# Patient Record
Sex: Female | Born: 1940 | Race: White | Hispanic: No | Marital: Single | Smoking: Former smoker
Health system: Southern US, Community
[De-identification: ages and names within clinical notes are randomized; demographics above are authoritative.]

## PROBLEM LIST (undated history)

## (undated) DIAGNOSIS — T7840XA Allergy, unspecified, initial encounter: Secondary | ICD-10-CM

## (undated) DIAGNOSIS — I10 Essential (primary) hypertension: Secondary | ICD-10-CM

## (undated) DIAGNOSIS — K7460 Unspecified cirrhosis of liver: Secondary | ICD-10-CM

## (undated) DIAGNOSIS — K7581 Nonalcoholic steatohepatitis (NASH): Secondary | ICD-10-CM

## (undated) DIAGNOSIS — E559 Vitamin D deficiency, unspecified: Secondary | ICD-10-CM

## (undated) DIAGNOSIS — K5900 Constipation, unspecified: Secondary | ICD-10-CM

## (undated) DIAGNOSIS — D696 Thrombocytopenia, unspecified: Secondary | ICD-10-CM

## (undated) DIAGNOSIS — R161 Splenomegaly, not elsewhere classified: Secondary | ICD-10-CM

## (undated) DIAGNOSIS — L439 Lichen planus, unspecified: Secondary | ICD-10-CM

## (undated) DIAGNOSIS — E785 Hyperlipidemia, unspecified: Secondary | ICD-10-CM

## (undated) DIAGNOSIS — I8500 Esophageal varices without bleeding: Secondary | ICD-10-CM

## (undated) DIAGNOSIS — J309 Allergic rhinitis, unspecified: Secondary | ICD-10-CM

## (undated) DIAGNOSIS — E119 Type 2 diabetes mellitus without complications: Secondary | ICD-10-CM

## (undated) DIAGNOSIS — K635 Polyp of colon: Secondary | ICD-10-CM

## (undated) DIAGNOSIS — C801 Malignant (primary) neoplasm, unspecified: Secondary | ICD-10-CM

## (undated) DIAGNOSIS — K766 Portal hypertension: Secondary | ICD-10-CM

## (undated) DIAGNOSIS — K760 Fatty (change of) liver, not elsewhere classified: Secondary | ICD-10-CM

## (undated) DIAGNOSIS — H269 Unspecified cataract: Secondary | ICD-10-CM

## (undated) DIAGNOSIS — M810 Age-related osteoporosis without current pathological fracture: Secondary | ICD-10-CM

## (undated) DIAGNOSIS — E042 Nontoxic multinodular goiter: Secondary | ICD-10-CM

## (undated) DIAGNOSIS — E039 Hypothyroidism, unspecified: Secondary | ICD-10-CM

## (undated) HISTORY — PX: COLONOSCOPY: SHX174

## (undated) HISTORY — DX: Malignant (primary) neoplasm, unspecified: C80.1

## (undated) HISTORY — DX: Allergy, unspecified, initial encounter: T78.40XA

## (undated) HISTORY — DX: Esophageal varices without bleeding: I85.00

## (undated) HISTORY — DX: Thrombocytopenia, unspecified: D69.6

## (undated) HISTORY — DX: Type 2 diabetes mellitus without complications: E11.9

## (undated) HISTORY — PX: UPPER GASTROINTESTINAL ENDOSCOPY: SHX188

## (undated) HISTORY — DX: Nontoxic multinodular goiter: E04.2

## (undated) HISTORY — DX: Fatty (change of) liver, not elsewhere classified: K76.0

## (undated) HISTORY — DX: Allergic rhinitis, unspecified: J30.9

## (undated) HISTORY — DX: Unspecified cirrhosis of liver: K74.60

## (undated) HISTORY — DX: Hypothyroidism, unspecified: E03.9

## (undated) HISTORY — DX: Age-related osteoporosis without current pathological fracture: M81.0

## (undated) HISTORY — DX: Splenomegaly, not elsewhere classified: R16.1

## (undated) HISTORY — DX: Lichen planus, unspecified: L43.9

## (undated) HISTORY — DX: Polyp of colon: K63.5

## (undated) HISTORY — DX: Unspecified cataract: H26.9

## (undated) HISTORY — DX: Hyperlipidemia, unspecified: E78.5

## (undated) HISTORY — DX: Vitamin D deficiency, unspecified: E55.9

## (undated) HISTORY — PX: POLYPECTOMY: SHX149

## (undated) HISTORY — DX: Essential (primary) hypertension: I10

## (undated) HISTORY — DX: Portal hypertension: K76.6

## (undated) HISTORY — DX: Nonalcoholic steatohepatitis (NASH): K75.81

## (undated) HISTORY — DX: Constipation, unspecified: K59.00

---

## 1983-05-14 HISTORY — PX: CHOLECYSTECTOMY: SHX55

## 1997-10-05 ENCOUNTER — Other Ambulatory Visit: Admission: RE | Admit: 1997-10-05 | Discharge: 1997-10-05 | Payer: Self-pay | Admitting: Obstetrics and Gynecology

## 1998-10-18 ENCOUNTER — Other Ambulatory Visit: Admission: RE | Admit: 1998-10-18 | Discharge: 1998-10-18 | Payer: Self-pay | Admitting: Obstetrics and Gynecology

## 1999-12-20 ENCOUNTER — Encounter: Admission: RE | Admit: 1999-12-20 | Discharge: 1999-12-20 | Payer: Self-pay | Admitting: Obstetrics and Gynecology

## 1999-12-20 ENCOUNTER — Encounter: Payer: Self-pay | Admitting: Obstetrics and Gynecology

## 2000-01-16 ENCOUNTER — Other Ambulatory Visit: Admission: RE | Admit: 2000-01-16 | Discharge: 2000-01-16 | Payer: Self-pay | Admitting: Obstetrics and Gynecology

## 2001-01-02 ENCOUNTER — Encounter: Payer: Self-pay | Admitting: Obstetrics and Gynecology

## 2001-01-02 ENCOUNTER — Encounter: Admission: RE | Admit: 2001-01-02 | Discharge: 2001-01-02 | Payer: Self-pay | Admitting: Obstetrics and Gynecology

## 2002-03-19 ENCOUNTER — Encounter: Payer: Self-pay | Admitting: Obstetrics and Gynecology

## 2002-03-19 ENCOUNTER — Encounter: Admission: RE | Admit: 2002-03-19 | Discharge: 2002-03-19 | Payer: Self-pay | Admitting: Obstetrics and Gynecology

## 2002-03-24 ENCOUNTER — Other Ambulatory Visit: Admission: RE | Admit: 2002-03-24 | Discharge: 2002-03-24 | Payer: Self-pay | Admitting: Obstetrics and Gynecology

## 2003-05-14 HISTORY — PX: OTHER SURGICAL HISTORY: SHX169

## 2003-06-30 ENCOUNTER — Other Ambulatory Visit: Admission: RE | Admit: 2003-06-30 | Discharge: 2003-06-30 | Payer: Self-pay | Admitting: Obstetrics and Gynecology

## 2003-07-06 ENCOUNTER — Encounter: Admission: RE | Admit: 2003-07-06 | Discharge: 2003-07-06 | Payer: Self-pay | Admitting: Obstetrics and Gynecology

## 2003-09-30 ENCOUNTER — Encounter: Admission: RE | Admit: 2003-09-30 | Discharge: 2003-09-30 | Payer: Self-pay | Admitting: Family Medicine

## 2004-02-15 ENCOUNTER — Ambulatory Visit (HOSPITAL_COMMUNITY): Admission: RE | Admit: 2004-02-15 | Discharge: 2004-02-15 | Payer: Self-pay | Admitting: Orthopedic Surgery

## 2004-02-15 ENCOUNTER — Ambulatory Visit (HOSPITAL_BASED_OUTPATIENT_CLINIC_OR_DEPARTMENT_OTHER): Admission: RE | Admit: 2004-02-15 | Discharge: 2004-02-15 | Payer: Self-pay | Admitting: Orthopedic Surgery

## 2004-08-31 ENCOUNTER — Other Ambulatory Visit: Admission: RE | Admit: 2004-08-31 | Discharge: 2004-08-31 | Payer: Self-pay | Admitting: Obstetrics and Gynecology

## 2004-09-06 ENCOUNTER — Encounter: Admission: RE | Admit: 2004-09-06 | Discharge: 2004-09-06 | Payer: Self-pay | Admitting: Obstetrics and Gynecology

## 2005-10-30 ENCOUNTER — Encounter: Admission: RE | Admit: 2005-10-30 | Discharge: 2005-10-30 | Payer: Self-pay | Admitting: Obstetrics and Gynecology

## 2006-01-07 ENCOUNTER — Encounter: Payer: Self-pay | Admitting: Internal Medicine

## 2006-04-14 ENCOUNTER — Encounter: Admission: RE | Admit: 2006-04-14 | Discharge: 2006-07-13 | Payer: Self-pay | Admitting: Family Medicine

## 2006-05-13 HISTORY — PX: BREAST SURGERY: SHX581

## 2006-05-13 HISTORY — PX: BREAST EXCISIONAL BIOPSY: SUR124

## 2006-08-11 ENCOUNTER — Encounter: Admission: RE | Admit: 2006-08-11 | Discharge: 2006-11-09 | Payer: Self-pay | Admitting: Family Medicine

## 2006-11-03 ENCOUNTER — Encounter: Admission: RE | Admit: 2006-11-03 | Discharge: 2006-11-03 | Payer: Self-pay | Admitting: Obstetrics and Gynecology

## 2006-11-05 ENCOUNTER — Encounter: Admission: RE | Admit: 2006-11-05 | Discharge: 2006-11-05 | Payer: Self-pay | Admitting: Obstetrics and Gynecology

## 2006-12-16 ENCOUNTER — Encounter (INDEPENDENT_AMBULATORY_CARE_PROVIDER_SITE_OTHER): Payer: Self-pay | Admitting: Surgery

## 2006-12-16 ENCOUNTER — Ambulatory Visit (HOSPITAL_BASED_OUTPATIENT_CLINIC_OR_DEPARTMENT_OTHER): Admission: RE | Admit: 2006-12-16 | Discharge: 2006-12-16 | Payer: Self-pay | Admitting: Surgery

## 2006-12-16 ENCOUNTER — Encounter: Admission: RE | Admit: 2006-12-16 | Discharge: 2006-12-16 | Payer: Self-pay | Admitting: Surgery

## 2007-05-19 ENCOUNTER — Encounter: Payer: Self-pay | Admitting: Endocrinology

## 2007-06-19 ENCOUNTER — Encounter: Payer: Self-pay | Admitting: Endocrinology

## 2007-06-25 ENCOUNTER — Ambulatory Visit: Payer: Self-pay | Admitting: Hematology and Oncology

## 2007-06-30 LAB — CBC WITH DIFFERENTIAL/PLATELET
BASO%: 0.6 % (ref 0.0–2.0)
Basophils Absolute: 0 10*3/uL (ref 0.0–0.1)
EOS%: 1.2 % (ref 0.0–7.0)
HCT: 41.4 % (ref 34.8–46.6)
MCH: 30.9 pg (ref 26.0–34.0)
MCHC: 34.4 g/dL (ref 32.0–36.0)
MCV: 89.7 fL (ref 81.0–101.0)
MONO%: 6.3 % (ref 0.0–13.0)
NEUT%: 56 % (ref 39.6–76.8)
RDW: 12.5 % (ref 11.3–14.5)
lymph#: 2.2 10*3/uL (ref 0.9–3.3)

## 2007-07-02 LAB — PROTEIN ELECTROPHORESIS, SERUM, WITH REFLEX
Albumin ELP: 61.6 % (ref 55.8–66.1)
Alpha-2-Globulin: 11.6 % (ref 7.1–11.8)
Beta 2: 4.2 % (ref 3.2–6.5)
Beta Globulin: 6 % (ref 4.7–7.2)
Total Protein, Serum Electrophoresis: 7.1 g/dL (ref 6.0–8.3)

## 2007-07-02 LAB — COMPREHENSIVE METABOLIC PANEL
AST: 17 U/L (ref 0–37)
Albumin: 4.4 g/dL (ref 3.5–5.2)
Alkaline Phosphatase: 49 U/L (ref 39–117)
BUN: 19 mg/dL (ref 6–23)
Glucose, Bld: 84 mg/dL (ref 70–99)
Potassium: 4.1 mEq/L (ref 3.5–5.3)
Total Bilirubin: 0.5 mg/dL (ref 0.3–1.2)

## 2007-07-02 LAB — ANA: Anti Nuclear Antibody(ANA): NEGATIVE

## 2007-07-02 LAB — LACTATE DEHYDROGENASE: LDH: 145 U/L (ref 94–250)

## 2007-07-02 LAB — VITAMIN B12: Vitamin B-12: 754 pg/mL (ref 211–911)

## 2007-07-03 ENCOUNTER — Ambulatory Visit (HOSPITAL_COMMUNITY): Admission: RE | Admit: 2007-07-03 | Discharge: 2007-07-03 | Payer: Self-pay | Admitting: Hematology and Oncology

## 2007-07-30 ENCOUNTER — Encounter: Payer: Self-pay | Admitting: Endocrinology

## 2007-08-20 ENCOUNTER — Encounter: Admission: RE | Admit: 2007-08-20 | Discharge: 2007-08-20 | Payer: Self-pay | Admitting: Family Medicine

## 2007-10-19 ENCOUNTER — Encounter: Payer: Self-pay | Admitting: Endocrinology

## 2007-11-04 ENCOUNTER — Encounter: Admission: RE | Admit: 2007-11-04 | Discharge: 2007-11-04 | Payer: Self-pay | Admitting: Obstetrics and Gynecology

## 2007-11-20 ENCOUNTER — Encounter: Payer: Self-pay | Admitting: Endocrinology

## 2007-12-04 ENCOUNTER — Encounter: Admission: RE | Admit: 2007-12-04 | Discharge: 2007-12-04 | Payer: Self-pay | Admitting: Family Medicine

## 2007-12-09 ENCOUNTER — Encounter: Payer: Self-pay | Admitting: Endocrinology

## 2007-12-16 ENCOUNTER — Encounter: Admission: RE | Admit: 2007-12-16 | Discharge: 2007-12-16 | Payer: Self-pay | Admitting: Obstetrics and Gynecology

## 2008-01-12 ENCOUNTER — Encounter: Payer: Self-pay | Admitting: Endocrinology

## 2008-01-12 ENCOUNTER — Ambulatory Visit: Payer: Self-pay | Admitting: Endocrinology

## 2008-01-12 ENCOUNTER — Other Ambulatory Visit: Admission: RE | Admit: 2008-01-12 | Discharge: 2008-01-12 | Payer: Self-pay | Admitting: Endocrinology

## 2008-01-12 DIAGNOSIS — J309 Allergic rhinitis, unspecified: Secondary | ICD-10-CM | POA: Insufficient documentation

## 2008-01-12 DIAGNOSIS — E042 Nontoxic multinodular goiter: Secondary | ICD-10-CM | POA: Insufficient documentation

## 2008-01-12 DIAGNOSIS — J302 Other seasonal allergic rhinitis: Secondary | ICD-10-CM | POA: Insufficient documentation

## 2008-01-12 DIAGNOSIS — Z9189 Other specified personal risk factors, not elsewhere classified: Secondary | ICD-10-CM | POA: Insufficient documentation

## 2008-01-12 HISTORY — DX: Allergic rhinitis, unspecified: J30.9

## 2008-01-12 HISTORY — DX: Nontoxic multinodular goiter: E04.2

## 2008-09-06 ENCOUNTER — Telehealth (INDEPENDENT_AMBULATORY_CARE_PROVIDER_SITE_OTHER): Payer: Self-pay | Admitting: *Deleted

## 2008-09-07 ENCOUNTER — Ambulatory Visit: Payer: Self-pay | Admitting: Endocrinology

## 2008-09-07 DIAGNOSIS — E041 Nontoxic single thyroid nodule: Secondary | ICD-10-CM | POA: Insufficient documentation

## 2008-09-07 DIAGNOSIS — E039 Hypothyroidism, unspecified: Secondary | ICD-10-CM

## 2008-09-07 HISTORY — DX: Hypothyroidism, unspecified: E03.9

## 2008-09-08 LAB — CONVERTED CEMR LAB: TSH: 1.88 microintl units/mL (ref 0.35–5.50)

## 2008-09-09 ENCOUNTER — Encounter: Admission: RE | Admit: 2008-09-09 | Discharge: 2008-09-09 | Payer: Self-pay | Admitting: Endocrinology

## 2008-11-04 ENCOUNTER — Encounter: Admission: RE | Admit: 2008-11-04 | Discharge: 2008-11-04 | Payer: Self-pay | Admitting: Family Medicine

## 2008-11-04 ENCOUNTER — Encounter: Admission: RE | Admit: 2008-11-04 | Discharge: 2008-11-04 | Payer: Self-pay | Admitting: Obstetrics and Gynecology

## 2009-09-12 ENCOUNTER — Ambulatory Visit: Payer: Self-pay | Admitting: Endocrinology

## 2009-09-13 LAB — CONVERTED CEMR LAB: TSH: 1.29 microintl units/mL (ref 0.35–5.50)

## 2009-11-16 ENCOUNTER — Encounter: Admission: RE | Admit: 2009-11-16 | Discharge: 2009-11-16 | Payer: Self-pay | Admitting: Obstetrics and Gynecology

## 2009-12-29 DIAGNOSIS — R87619 Unspecified abnormal cytological findings in specimens from cervix uteri: Secondary | ICD-10-CM | POA: Insufficient documentation

## 2010-01-18 DIAGNOSIS — D259 Leiomyoma of uterus, unspecified: Secondary | ICD-10-CM | POA: Insufficient documentation

## 2010-02-06 ENCOUNTER — Ambulatory Visit (HOSPITAL_COMMUNITY)
Admission: RE | Admit: 2010-02-06 | Discharge: 2010-02-06 | Payer: Self-pay | Source: Home / Self Care | Admitting: Obstetrics and Gynecology

## 2010-03-22 ENCOUNTER — Ambulatory Visit: Payer: Self-pay | Admitting: Endocrinology

## 2010-03-22 LAB — CONVERTED CEMR LAB: TSH: 1.27 microintl units/mL (ref 0.35–5.50)

## 2010-06-12 NOTE — Assessment & Plan Note (Signed)
Summary: f/u on thyroid per pt/#/cd   Vital Signs:  Patient profile:   70 year old female Height:      64 inches Weight:      223.50 pounds BMI:     38.50 O2 Sat:      96 % on Room air Temp:     98.2 degrees F oral Pulse rate:   74 / minute BP sitting:   122 / 72  (left arm) Cuff size:   large  Vitals Entered ByShirlean Mylar Ewing (Sep 12, 2009 9:44 AM)  O2 Flow:  Room air CC: Thyroid consult/ER   Referring Provider:  Dr. Drema Dallas Primary Provider:  Leighton Ruff   CC:  Thyroid consult/ER.  History of Present Illness: pt has a large multinodular goiter.  bx was benign in 2009.  she continues to notice a slight fullness at the anterior neck.  Current Medications (verified): 1)  Metformin Hcl 500 Mg Tabs (Metformin Hcl) .... Take 1 By Mouth Qd 2)  Levothyroxine Sodium 50 Mcg Tabs (Levothyroxine Sodium) .... Take 1 By Mouth Qd 3)  Evista 60 Mg Tabs (Raloxifene Hcl) .... Take 1 By Mouth Qd 4)  Quinaretic 20-12.5 Mg Tabs (Quinapril-Hydrochlorothiazide) .... Take 1 By Mouth Qd 5)  Loratadine 10 Mg Tabs (Loratadine) .... Take 1 By Mouth Qd 6)  Estrace 0.1 Mg/gm Crea (Estradiol) .... Use 3 X Month  Allergies (verified): No Known Drug Allergies  Past History:  Past Medical History: Last updated: 01/12/2008 Diabetes Hypertension High Cholesterol ALLERGIC RHINITIS (ICD-477.9) GOITER, MULTINODULAR (ICD-241.1) CHICKENPOX, HX OF (ICD-V15.9)  Review of Systems  The patient denies dyspnea on exertion.         denies dysphagia.  Physical Exam  General:  normal appearance.   Neck:  large multinodular goiter, slightly larger on the right Additional Exam:   FastTSH                   1.29 uIU/mL    Impression & Recommendations:  Problem # 1:  GOITER, MULTINODULAR (ICD-241.1) Assessment Unchanged  Problem # 2:  HYPOTHYROIDISM (ICD-244.9) well-replaced  Other Orders: TLB-TSH (Thyroid Stimulating Hormone) (84443-TSH) Est. Patient Level III (83094)  Patient  Instructions: 1)  return 6 months 2)  tests are being ordered for you today.  a few days after the test(s), please call [phone] to hear your test results. 3)  pending the test results, please continue the same synthroid (50 micrograms/day) 4)  most of the time, a "lumpy thyroid" will eventually become overactive.  this is usually a slow process, happening over the span of many years. 5)  (update: i left message on phone-tree:  rx as we discussed)

## 2010-06-12 NOTE — Assessment & Plan Note (Signed)
Summary: 6 month follow up-lb   Vital Signs:  Patient profile:   70 year old female Height:      64 inches (162.56 cm) Weight:      227.13 pounds (103.24 kg) BMI:     39.13 O2 Sat:      96 % on Room air Temp:     98.2 degrees F (36.78 degrees C) oral Pulse rate:   75 / minute BP sitting:   122 / 80  (left arm) Cuff size:   large  Vitals Entered By: Rebeca Alert CMA Deborra Medina) (March 22, 2010 8:12 AM)  O2 Flow:  Room air CC: 6 month F/U/aj Is Patient Diabetic? No   Referring Provider:  Dr. Drema Dallas Primary Provider:  Leighton Ruff   CC:  6 month F/U/aj.  History of Present Illness: pt has h/x multinodular goiter.  bilat bxs were benign in 2009.  she says she seldom, if ever, notices the goiter.    Current Medications (verified): 1)  Metformin Hcl 500 Mg Tabs (Metformin Hcl) .... Take 1 By Mouth Qd 2)  Levothyroxine Sodium 50 Mcg Tabs (Levothyroxine Sodium) .... Take 1 By Mouth Qd 3)  Evista 60 Mg Tabs (Raloxifene Hcl) .... Take 1 By Mouth Qd 4)  Quinaretic 20-12.5 Mg Tabs (Quinapril-Hydrochlorothiazide) .... Take 1 By Mouth Qd 5)  Loratadine 10 Mg Tabs (Loratadine) .... Take 1 By Mouth Qd 6)  Estrace 0.1 Mg/gm Crea (Estradiol) .... Use 3 X Month  Allergies (verified): No Known Drug Allergies  Past History:  Past Medical History: Last updated: 01/12/2008 Diabetes Hypertension High Cholesterol ALLERGIC RHINITIS (ICD-477.9) GOITER, MULTINODULAR (ICD-241.1) CHICKENPOX, HX OF (ICD-V15.9)  Review of Systems  The patient denies dyspnea on exertion.    Physical Exam  General:  normal appearance.   Neck:  large multinodular goiter, slightly larger on the right Additional Exam:  FastTSH                   1.27 uIU/mL      Impression & Recommendations:  Problem # 1:  GOITER, MULTINODULAR (ICD-241.1) Assessment Unchanged  Problem # 2:  HYPOTHYROIDISM (ICD-244.9) either #1 is autoimmune (unusual), or she has 2 diseases.  Other Orders: TLB-TSH (Thyroid  Stimulating Hormone) (84443-TSH) Est. Patient Level III (87681)  Patient Instructions: 1)  return 1 year 2)  tests are being ordered for you today.  a few days after the test(s), please call [phone] to hear your test results. 3)  pending the test results, please continue the same synthroid (50 micrograms/day) 4)  most of the time, a "lumpy thyroid" will eventually become overactive.  this is usually a slow process, happening over the span of many years. 5)  (update: i left message on phone-tree:  rx as we discussed) Prescriptions: LEVOTHYROXINE SODIUM 50 MCG TABS (LEVOTHYROXINE SODIUM) take 1 by mouth qd  #90 x 3   Entered and Authorized by:   Donavan Foil MD   Signed by:   Donavan Foil MD on 03/22/2010   Method used:   Print then Give to Patient   RxID:   [id number] LEVOTHYROXINE SODIUM 50 MCG TABS (LEVOTHYROXINE SODIUM) take 1 by mouth qd  #90 x 3   Entered and Authorized by:   Donavan Foil MD   Signed by:   Donavan Foil MD on 03/22/2010   Method used:   Print then Give to Patient   RxID:   [id number] LEVOTHYROXINE SODIUM 50 MCG TABS (LEVOTHYROXINE SODIUM) take 1 by mouth qd  #  90 x 3   Entered and Authorized by:   Donavan Foil MD   Signed by:   Donavan Foil MD on 03/22/2010   Method used:   Faxed to ...       [facility] (mail-order)       [address] [place], [place]  [id number]       Ph: [phone]       Fax: [phone]   RxID:   [id number]    Orders Added: 1)  TLB-TSH (Thyroid Stimulating Hormone) [84443-TSH] 2)  Est. Patient Level III [03013]   Immunization History:  Influenza Immunization History:    Influenza:  historical (02/10/2010)   Immunization History:  Influenza Immunization History:    Influenza:  Historical (02/10/2010)

## 2010-07-26 LAB — CBC
MCHC: 34.5 g/dL (ref 30.0–36.0)
MCV: 91.5 fL (ref 78.0–100.0)
Platelets: 125 10*3/uL — ABNORMAL LOW (ref 150–400)
RBC: 4.17 MIL/uL (ref 3.87–5.11)

## 2010-07-26 LAB — URINALYSIS, ROUTINE W REFLEX MICROSCOPIC
Bilirubin Urine: NEGATIVE
Ketones, ur: NEGATIVE mg/dL
Specific Gravity, Urine: <1.005 — ABNORMAL LOW (ref 1.005–1.030)
Urobilinogen, UA: 0.2 mg/dL (ref 0.0–1.0)

## 2010-07-26 LAB — BASIC METABOLIC PANEL
CO2: 27 mEq/L (ref 19–32)
Chloride: 104 mEq/L (ref 96–112)
Creatinine, Ser: 0.64 mg/dL (ref 0.4–1.2)
GFR calc Af Amer: >60 mL/min (ref >60)
GFR calc non Af Amer: >60 mL/min (ref >60)
Glucose, Bld: 119 mg/dL — ABNORMAL HIGH (ref 70–99)

## 2010-07-26 LAB — PROTIME-INR: Prothrombin Time: 13 seconds (ref 11.6–15.2)

## 2010-09-25 NOTE — Op Note (Signed)
Crystal Barnett, Crystal Barnett                ACCOUNT NO.:  [id number]   MEDICAL RECORD NO.:  [id number]          PATIENT TYPE:  AMB   LOCATION:  [place]                          FACILITY:  [facility]   PHYSICIAN:  Thomas A. Cornett, M.D.DATE OF BIRTH:  12-03-1940   DATE OF PROCEDURE:  12/16/2006  DATE OF DISCHARGE:                               OPERATIVE REPORT   PREOPERATIVE DIAGNOSIS:  Right breast microcalcifications.   POSTOPERATIVE DIAGNOSIS:  Right breast microcalcifications.   PROCEDURE:  Right breast needle localized excisional biopsy.   SURGEON:  Marcello Moores A. Cornett, M.D.   ANESTHESIA:  LMA with 20 mL of 0.25% Sensorcaine with epinephrine local.   ESTIMATED BLOOD LOSS:  20 mL.   SPECIMEN:  Right breast tissue with localizing wire and  microcalcifications verified by radiograph.   INDICATIONS FOR PROCEDURE:  The patient is a 70 year old female with  right breast microcalcifications that are too deep to biopsy and were  suspicious.  She was sent for needle localized excisional biopsy for  further evaluation of these.   DESCRIPTION OF PROCEDURE:  The patient was brought to the operating room  and placed supine.  After induction of LMA anesthesia, the right breast  was prepped and draped in a sterile fashion.  The wire exited out to the  inferior mammary fold on the right in the midline.  I made an incision  near the wire and pulled the wire out the incision in the inferior  mammary fold.  The calcifications were on the chest wall on the anterior  aspect of the nipple areolar complex.  I excised a tract of tissue from  the wire to a site all the way to just below the nipple off the  pectoralis major muscle.  This was oriented, sent to radiology, and  found to be adequate by radiography.  The wound was hemostatic.  I  closed in layers using 3-0 Vicryl for deeper layer and 4-0 Monocryl in a  subcuticular fashion.  Dermabond was placed for a dressing.  All final  counts of sponge, needle and  instruments were found to be correct this  portion of the case.  The patient was awakened and taken to recovery in  satisfactory condition.      Thomas A. Cornett, M.D.  Electronically Signed     TAC/MEDQ  D:  12/16/2006  T:  12/16/2006  Job:  [id number]   cc:   Theodoro Kos, DO

## 2010-09-28 NOTE — Op Note (Signed)
NAMESTEELE, LEDONNE                ACCOUNT NO.:  [id number]   MEDICAL RECORD NO.:  [id number]          PATIENT TYPE:  AMB   LOCATION:  [place]                          FACILITY:  [facility]   PHYSICIAN:  Kathalene Frames. Mayer Camel, M.D.   DATE OF BIRTH:  29-Jun-1940   DATE OF PROCEDURE:  02/15/2004  DATE OF DISCHARGE:                                 OPERATIVE REPORT   PREOPERATIVE DIAGNOSES:  1.  Right knee meniscal tear.  2.  Possible chondromalacia.   POSTOPERATIVE DIAGNOSES:  1.  Right knee lateral meniscal tear, radial type.  2.  Cartilaginous loose bodies.  3.  Grade 3 chondromalacia of the medial femoral condyle.   PROCEDURES:  1.  Left knee arthroscopic partial lateral meniscectomy.  2.  Removal of loose bodies.  3.  Debridement of grade 3 chondromalacia, medial femoral condyle.   SURGEON:  Kathalene Frames. Mayer Camel, M.D.   ANESTHESIA:  Local with IV sedation.   ESTIMATED BLOOD LOSS:  Minimal.   FLUIDS REPLACED:  800 mL of crystalloid.   DRAINS PLACED:  None.   TOURNIQUET TIME:  None.   INDICATION FOR PROCEDURE:  A 70 year old woman with joint line pain along  the left knee for a number of months, who has failed conservative treatment  with anti-inflammatory medicines, observation, and exercises, and now  desires elective arthroscopic evaluation and treatment of her left knee.  She got good temporary relief from a cortisone injection, but the pain  recurred within about seven days and therefore she desires elective  evaluation and treatment of her left knee.   DESCRIPTION OF PROCEDURE:  The patient identified by arm band, taken to the  operating room at [facility] day surgery center.  Appropriate anesthetic monitors  were attached and local anesthesia with IV sedation induced into the left  knee.  Lateral post applied to the table.  Left lower extremity prepped and  draped in the usual sterile fashion from the ankle to the mid-thigh, and we  began the procedure by making standard inferomedial and  inferolateral  peripatellar portals, allowing introduction of the arthroscope through the  inferolateral portal and the outflow through the inferomedial portal.  Diagnostic arthroscopy revealed essentially no chondromalacia of the  trochlea or the patella.  On the medial side we identified grade 3  chondromalacia of the medial femoral condyle distally near the notch, and  this was debrided back to a stable margin with a 3.5 Gator sucker shaver.  We also encountered the first of two cartilaginous loose bodies, which was  removed with a 4.2 Great [place] sucker shaver.  Moving into the notch, the  ACL and the PCL were intact.  On the lateral side we encountered our second  loose body, again about 4 mm in diameter.  We also found a radial tear of  the lateral meniscus, which was debrided back to a stable margin first with  the [place] sucker shaver and then the 3.5 Gator.  The gutters were  clear.  The scope was taken to the medial side of the PCL, clearing the  posterior compartment as well.  The knee was irrigated out with normal  saline solution.  The arthroscopic instruments were removed and a dressing  of Xeroform, 4 x 4 dressing sponges, Webril, and an Ace wrap applied.  The  patient was awakened and taken to the recovery room without difficulty.      Blair Hailey  D:  02/15/2004  T:  02/15/2004  Job:  [id number]

## 2010-10-16 ENCOUNTER — Ambulatory Visit (INDEPENDENT_AMBULATORY_CARE_PROVIDER_SITE_OTHER): Payer: Medicare Other | Admitting: Endocrinology

## 2010-10-16 ENCOUNTER — Encounter: Payer: Self-pay | Admitting: Endocrinology

## 2010-10-16 ENCOUNTER — Other Ambulatory Visit (INDEPENDENT_AMBULATORY_CARE_PROVIDER_SITE_OTHER): Payer: Medicare Other

## 2010-10-16 VITALS — BP 122/72 | HR 74 | Temp 98.0°F | Ht 64.5 in | Wt 227.0 lb

## 2010-10-16 DIAGNOSIS — E039 Hypothyroidism, unspecified: Secondary | ICD-10-CM

## 2010-10-16 NOTE — Patient Instructions (Addendum)
blood tests are being ordered for you today.  please call [phone] to hear your test results.  You will be prompted to enter the 9-digit "MRN" number that appears at the top left of this page, followed by #.  Then you will hear the message. Please return in 1 year. It is uncertain if your thyroid will become more or less active over the years. (update: i left message on phone-tree:  rx as we discussed)

## 2010-10-16 NOTE — [facility] Notes (Signed)
  Subjective:    Patient ID: Crystal Barnett, female    DOB: Jan 01, 1941, 70 y.o.   MRN: [id number]  HPI pt has h/x multinodular goiter.  bilat bxs were benign in 2009.  The cytology did not show chronic thyroiditis, but she has chronic hypothyroidism.  she says she occasionally notices the goiter.  pt states she feels well in general.   Past Medical History  Diagnosis Date  . GOITER, MULTINODULAR 01/12/2008  . HYPOTHYROIDISM 09/07/2008  . ALLERGIC RHINITIS 01/12/2008  . Depression   . Hypertension   . Hyperlipidemia     Past Surgical History  Procedure Date  . Cholecystectomy 1985  . Breast surgery 2008    Breast Biopsy   . Arthroscopic left knee 2005    History   Social History  . Marital Status: Single    Spouse Name: N/A    Number of Children: N/A  . Years of Education: N/A   Occupational History  . 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 Smoking status: Former Smoker    Quit date: 05/13/1990  . Smokeless tobacco: Not on file  . Alcohol Use: Not on file  . Drug Use: Not on file  . Sexually Active: Not on file   Other Topics Concern  . Not on file   Social History Narrative  . No narrative on file    Current Outpatient Prescriptions on File Prior to Visit  Medication Sig Dispense Refill  . estradiol (ESTRACE) 0.1 MG/GM vaginal cream Use three times a month       . levothyroxine (SYNTHROID, LEVOTHROID) 50 MCG tablet Take 50 mcg by mouth daily.        Marland Kitchen loratadine (CLARITIN) 10 MG tablet Take 10 mg by mouth daily.        . metFORMIN (GLUCOPHAGE) 500 MG tablet Take 1 tablet by mouth once daily       . raloxifene (EVISTA) 60 MG tablet Take 60 mg by mouth daily.          No Known Allergies  Family History  Problem Relation Age of Onset  . Thyroid disease Neg Hx   . Goiter Neg Hx     BP 122/72  Pulse 74  Temp(Src) 98 F (36.7 C) (Oral)  Ht 5' 4.5" (1.638 m)  Wt 227 lb (102.967 kg)  BMI 38.36  kg/m2  SpO2 95%    Review of Systems Denies difficulty swallowing or breathing.    Objective:   Physical Exam GENERAL: no distress Thyroid: large bilat multinodular goiter.    Lab Results  Component Value Date   TSH 1.10 10/16/2010     Assessment & Plan:  Multinodular goiter, uncertain etiology.  euthyroid

## 2010-10-21 MED ORDER — LEVOTHYROXINE SODIUM 50 MCG PO TABS: 50.0000 ug | ORAL_TABLET | Freq: Every day | ORAL | Status: DC

## 2010-10-21 NOTE — [facility] Notes (Signed)
Addended by: Renato Shin on: 10/21/2010 06:34 PM   Modules accepted: Orders

## 2010-10-22 ENCOUNTER — Other Ambulatory Visit: Payer: Self-pay | Admitting: Obstetrics and Gynecology

## 2010-10-22 DIAGNOSIS — Z1231 Encounter for screening mammogram for malignant neoplasm of breast: Secondary | ICD-10-CM

## 2010-10-24 ENCOUNTER — Ambulatory Visit
Admission: RE | Admit: 2010-10-24 | Discharge: 2010-10-24 | Disposition: A | Discharge status: Home / Self Care | Payer: Medicare Other | Source: Ambulatory Visit | Attending: Endocrinology | Admitting: Endocrinology

## 2010-10-24 DIAGNOSIS — E039 Hypothyroidism, unspecified: Secondary | ICD-10-CM

## 2010-11-19 ENCOUNTER — Ambulatory Visit
Admission: RE | Admit: 2010-11-19 | Discharge: 2010-11-19 | Disposition: A | Discharge status: Home / Self Care | Payer: Medicare Other | Source: Ambulatory Visit | Attending: Obstetrics and Gynecology | Admitting: Obstetrics and Gynecology

## 2010-11-19 DIAGNOSIS — Z1231 Encounter for screening mammogram for malignant neoplasm of breast: Secondary | ICD-10-CM

## 2011-02-25 LAB — DIFFERENTIAL
Basophils Absolute: 0
Basophils Relative: 0
Eosinophils Absolute: 0.1
Eosinophils Relative: 1
Monocytes Absolute: 0.3

## 2011-02-25 LAB — CBC
HCT: 40.6
Hemoglobin: 13.9
MCHC: 34.2
Platelets: 144 — ABNORMAL LOW
RDW: 12.4

## 2011-02-25 LAB — BASIC METABOLIC PANEL
BUN: 12
CO2: 30
GFR calc non Af Amer: >60
Glucose, Bld: 89
Potassium: 4.6
Sodium: 138

## 2011-03-23 ENCOUNTER — Other Ambulatory Visit: Payer: Self-pay | Admitting: Endocrinology

## 2011-03-25 ENCOUNTER — Other Ambulatory Visit: Payer: Self-pay | Admitting: *Deleted

## 2011-03-25 MED ORDER — LEVOTHYROXINE SODIUM 50 MCG PO TABS: 50.0000 ug | ORAL_TABLET | Freq: Every day | ORAL | Status: DC

## 2011-03-25 NOTE — Telephone Encounter (Signed)
Pt states that she needs rx for Levothyroxine to be sent to [facility]. Left message informing pt rx sent and to callback office with any questions/concerns.   Last OV-10/2010

## 2011-06-10 DIAGNOSIS — E119 Type 2 diabetes mellitus without complications: Secondary | ICD-10-CM | POA: Diagnosis not present

## 2011-06-10 DIAGNOSIS — H2510 Age-related nuclear cataract, unspecified eye: Secondary | ICD-10-CM | POA: Diagnosis not present

## 2011-07-25 DIAGNOSIS — Z79899 Other long term (current) drug therapy: Secondary | ICD-10-CM | POA: Diagnosis not present

## 2011-07-25 DIAGNOSIS — E7800 Pure hypercholesterolemia, unspecified: Secondary | ICD-10-CM | POA: Diagnosis not present

## 2011-07-25 DIAGNOSIS — E559 Vitamin D deficiency, unspecified: Secondary | ICD-10-CM | POA: Diagnosis not present

## 2011-07-25 DIAGNOSIS — E119 Type 2 diabetes mellitus without complications: Secondary | ICD-10-CM | POA: Diagnosis not present

## 2011-07-25 DIAGNOSIS — D696 Thrombocytopenia, unspecified: Secondary | ICD-10-CM | POA: Diagnosis not present

## 2011-07-25 DIAGNOSIS — I10 Essential (primary) hypertension: Secondary | ICD-10-CM | POA: Diagnosis not present

## 2011-07-25 DIAGNOSIS — J309 Allergic rhinitis, unspecified: Secondary | ICD-10-CM | POA: Diagnosis not present

## 2011-08-06 DIAGNOSIS — R7401 Elevation of levels of liver transaminase levels: Secondary | ICD-10-CM | POA: Diagnosis not present

## 2011-08-06 DIAGNOSIS — D72819 Decreased white blood cell count, unspecified: Secondary | ICD-10-CM | POA: Diagnosis not present

## 2011-08-06 DIAGNOSIS — R7989 Other specified abnormal findings of blood chemistry: Secondary | ICD-10-CM | POA: Diagnosis not present

## 2011-09-03 DIAGNOSIS — R7989 Other specified abnormal findings of blood chemistry: Secondary | ICD-10-CM | POA: Diagnosis not present

## 2011-11-06 ENCOUNTER — Other Ambulatory Visit: Payer: Self-pay | Admitting: Obstetrics and Gynecology

## 2011-11-06 DIAGNOSIS — Z1231 Encounter for screening mammogram for malignant neoplasm of breast: Secondary | ICD-10-CM

## 2011-11-20 DIAGNOSIS — R51 Headache: Secondary | ICD-10-CM | POA: Diagnosis not present

## 2011-11-20 DIAGNOSIS — H6590 Unspecified nonsuppurative otitis media, unspecified ear: Secondary | ICD-10-CM | POA: Diagnosis not present

## 2011-11-20 DIAGNOSIS — J309 Allergic rhinitis, unspecified: Secondary | ICD-10-CM | POA: Diagnosis not present

## 2011-11-20 DIAGNOSIS — R7989 Other specified abnormal findings of blood chemistry: Secondary | ICD-10-CM | POA: Diagnosis not present

## 2011-11-21 ENCOUNTER — Ambulatory Visit
Admission: RE | Admit: 2011-11-21 | Discharge: 2011-11-21 | Disposition: A | Discharge status: Home / Self Care | Payer: Medicare Other | Source: Ambulatory Visit | Attending: Obstetrics and Gynecology | Admitting: Obstetrics and Gynecology

## 2011-11-21 DIAGNOSIS — Z1231 Encounter for screening mammogram for malignant neoplasm of breast: Secondary | ICD-10-CM

## 2012-01-08 DIAGNOSIS — M949 Disorder of cartilage, unspecified: Secondary | ICD-10-CM | POA: Diagnosis not present

## 2012-01-08 DIAGNOSIS — N760 Acute vaginitis: Secondary | ICD-10-CM | POA: Diagnosis not present

## 2012-01-08 DIAGNOSIS — N952 Postmenopausal atrophic vaginitis: Secondary | ICD-10-CM | POA: Diagnosis not present

## 2012-01-08 DIAGNOSIS — M899 Disorder of bone, unspecified: Secondary | ICD-10-CM | POA: Diagnosis not present

## 2012-03-26 DIAGNOSIS — D696 Thrombocytopenia, unspecified: Secondary | ICD-10-CM | POA: Diagnosis not present

## 2012-03-26 DIAGNOSIS — E7800 Pure hypercholesterolemia, unspecified: Secondary | ICD-10-CM | POA: Diagnosis not present

## 2012-03-26 DIAGNOSIS — I10 Essential (primary) hypertension: Secondary | ICD-10-CM | POA: Diagnosis not present

## 2012-03-26 DIAGNOSIS — E119 Type 2 diabetes mellitus without complications: Secondary | ICD-10-CM | POA: Diagnosis not present

## 2012-03-26 DIAGNOSIS — Z1331 Encounter for screening for depression: Secondary | ICD-10-CM | POA: Diagnosis not present

## 2012-03-26 DIAGNOSIS — Z79899 Other long term (current) drug therapy: Secondary | ICD-10-CM | POA: Diagnosis not present

## 2012-03-26 DIAGNOSIS — Z23 Encounter for immunization: Secondary | ICD-10-CM | POA: Diagnosis not present

## 2012-03-26 DIAGNOSIS — E559 Vitamin D deficiency, unspecified: Secondary | ICD-10-CM | POA: Diagnosis not present

## 2012-04-02 ENCOUNTER — Other Ambulatory Visit: Payer: Self-pay | Admitting: *Deleted

## 2012-04-02 MED ORDER — LEVOTHYROXINE SODIUM 50 MCG PO TABS: 50.0000 ug | ORAL_TABLET | Freq: Every day | ORAL | Status: DC

## 2012-04-02 NOTE — Telephone Encounter (Signed)
REFILL FOR LEVOTHYROXINE SENT TO [facility]. FOR 90 DAY SUPPLY NO REFILLS. PATIENT NEEDS YEARLY OFFICE VISIT SCHEDULED WITH DR. Loanne Drilling. PATIENT NOTIFIED OF THIS AND  NEW ADDRESS AND PHONE #/

## 2012-04-16 ENCOUNTER — Ambulatory Visit (INDEPENDENT_AMBULATORY_CARE_PROVIDER_SITE_OTHER): Payer: Medicare Other | Admitting: Endocrinology

## 2012-04-16 ENCOUNTER — Encounter: Payer: Self-pay | Admitting: Endocrinology

## 2012-04-16 VITALS — BP 124/80 | HR 82 | Temp 98.1°F | Wt 240.0 lb

## 2012-04-16 DIAGNOSIS — E039 Hypothyroidism, unspecified: Secondary | ICD-10-CM | POA: Diagnosis not present

## 2012-04-16 LAB — TSH: TSH: 2.142 u[IU]/mL (ref 0.350–4.500)

## 2012-04-16 NOTE — [facility] Notes (Signed)
  Subjective:    Patient ID: Crystal Barnett, female    DOB: Jan 28, 1941, 71 y.o.   MRN: [id number]  HPI pt has h/x multinodular goiter.  bilat bxs were benign in 2009.  The cytology did not show chronic thyroiditis, but she has chronic hypothyroidism.  she says she seldom notices the goiter.  pt states she feels well in general, except for weight change.  Past Medical History  Diagnosis Date  . GOITER, MULTINODULAR 01/12/2008  . HYPOTHYROIDISM 09/07/2008  . ALLERGIC RHINITIS 01/12/2008  . Depression   . Hypertension   . Hyperlipidemia     Past Surgical History  Procedure Date  . Cholecystectomy 1985  . Breast surgery 2008    Breast Biopsy   . Arthroscopic left knee 2005    History   Social History  . Marital Status: Single    Spouse Name: N/A    Number of Children: N/A  . Years of Education: N/A   Occupational History  . 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 Smoking status: Former Smoker    Quit date: 05/13/1990  . Smokeless tobacco: Not on file  . Alcohol Use: Not on file  . Drug Use: Not on file  . Sexually Active: Not on file   Other Topics Concern  . Not on file   Social History Narrative  . No narrative on file    Current Outpatient Prescriptions on File Prior to Visit  Medication Sig Dispense Refill  . estradiol (ESTRACE) 0.1 MG/GM vaginal cream Use three times a month       . levothyroxine (SYNTHROID, LEVOTHROID) 50 MCG tablet Take 1 tablet (50 mcg total) by mouth daily.  90 tablet  0  . lisinopril-hydrochlorothiazide (PRINZIDE,ZESTORETIC) 20-12.5 MG per tablet Take 1 tablet by mouth daily.        Marland Kitchen loratadine (CLARITIN) 10 MG tablet Take 10 mg by mouth daily.        . metFORMIN (GLUCOPHAGE) 500 MG tablet Take 1 tablet by mouth once daily       . raloxifene (EVISTA) 60 MG tablet Take 60 mg by mouth daily.          No Known Allergies  Family History  Problem Relation Age of Onset  . Thyroid  disease Neg Hx   . Goiter Neg Hx     BP 124/80  Pulse 82  Temp 98.1 F (36.7 C) (Oral)  Wt 240 lb (108.863 kg)  SpO2 97%   Review of Systems Denies tremor    Objective:   Physical Exam VITAL SIGNS:  See vs page GENERAL: no distress Neck:  large bilat multinodular goiter.  Lab Results  Component Value Date   TSH 2.142 04/16/2012      Assessment & Plan:  Hyperthyroidism, due to large multinodular goiter, resolved with i-131 rx.  However, she is at risk for recurrence.

## 2012-04-16 NOTE — Patient Instructions (Addendum)
blood tests are being requested for you today.  We'll contact you with results. We can skip the ultrasound this year. Please return in 1 year.

## 2012-04-20 ENCOUNTER — Other Ambulatory Visit: Payer: Self-pay | Admitting: *Deleted

## 2012-04-20 MED ORDER — LEVOTHYROXINE SODIUM 50 MCG PO TABS: 50.0000 ug | ORAL_TABLET | Freq: Every day | ORAL | Status: DC

## 2012-04-20 NOTE — Telephone Encounter (Signed)
Refill sent to [facility] for year refill on medication levothyroxine.

## 2012-07-29 DIAGNOSIS — E559 Vitamin D deficiency, unspecified: Secondary | ICD-10-CM | POA: Diagnosis not present

## 2012-08-06 DIAGNOSIS — E119 Type 2 diabetes mellitus without complications: Secondary | ICD-10-CM | POA: Diagnosis not present

## 2012-08-06 DIAGNOSIS — H2510 Age-related nuclear cataract, unspecified eye: Secondary | ICD-10-CM | POA: Diagnosis not present

## 2012-08-06 DIAGNOSIS — H26019 Infantile and juvenile cortical, lamellar, or zonular cataract, unspecified eye: Secondary | ICD-10-CM | POA: Diagnosis not present

## 2012-10-21 DIAGNOSIS — I10 Essential (primary) hypertension: Secondary | ICD-10-CM | POA: Diagnosis not present

## 2012-10-21 DIAGNOSIS — E559 Vitamin D deficiency, unspecified: Secondary | ICD-10-CM | POA: Diagnosis not present

## 2012-10-21 DIAGNOSIS — Z79899 Other long term (current) drug therapy: Secondary | ICD-10-CM | POA: Diagnosis not present

## 2012-10-21 DIAGNOSIS — D696 Thrombocytopenia, unspecified: Secondary | ICD-10-CM | POA: Diagnosis not present

## 2012-10-21 DIAGNOSIS — E119 Type 2 diabetes mellitus without complications: Secondary | ICD-10-CM | POA: Diagnosis not present

## 2012-10-21 DIAGNOSIS — E7800 Pure hypercholesterolemia, unspecified: Secondary | ICD-10-CM | POA: Diagnosis not present

## 2012-10-28 DIAGNOSIS — H2510 Age-related nuclear cataract, unspecified eye: Secondary | ICD-10-CM | POA: Diagnosis not present

## 2012-12-30 ENCOUNTER — Other Ambulatory Visit: Payer: Self-pay

## 2012-12-30 DIAGNOSIS — Z1231 Encounter for screening mammogram for malignant neoplasm of breast: Secondary | ICD-10-CM

## 2013-01-13 DIAGNOSIS — H2510 Age-related nuclear cataract, unspecified eye: Secondary | ICD-10-CM | POA: Diagnosis not present

## 2013-01-20 DIAGNOSIS — H2510 Age-related nuclear cataract, unspecified eye: Secondary | ICD-10-CM | POA: Diagnosis not present

## 2013-01-21 ENCOUNTER — Ambulatory Visit: Payer: Medicare Other

## 2013-01-29 ENCOUNTER — Ambulatory Visit
Admission: RE | Admit: 2013-01-29 | Discharge: 2013-01-29 | Disposition: A | Discharge status: Home / Self Care | Payer: Medicare Other | Source: Ambulatory Visit

## 2013-01-29 DIAGNOSIS — Z1231 Encounter for screening mammogram for malignant neoplasm of breast: Secondary | ICD-10-CM

## 2013-02-25 DIAGNOSIS — Z1289 Encounter for screening for malignant neoplasm of other sites: Secondary | ICD-10-CM | POA: Diagnosis not present

## 2013-04-19 ENCOUNTER — Ambulatory Visit (INDEPENDENT_AMBULATORY_CARE_PROVIDER_SITE_OTHER): Payer: Medicare Other | Admitting: Endocrinology

## 2013-04-19 ENCOUNTER — Encounter: Payer: Self-pay | Admitting: Endocrinology

## 2013-04-19 VITALS — BP 130/86 | HR 76 | Temp 98.0°F | Ht 64.5 in | Wt 245.0 lb

## 2013-04-19 DIAGNOSIS — E042 Nontoxic multinodular goiter: Secondary | ICD-10-CM

## 2013-04-19 DIAGNOSIS — Z0000 Encounter for general adult medical examination without abnormal findings: Secondary | ICD-10-CM | POA: Diagnosis not present

## 2013-04-19 DIAGNOSIS — I4891 Unspecified atrial fibrillation: Secondary | ICD-10-CM

## 2013-04-19 DIAGNOSIS — E039 Hypothyroidism, unspecified: Secondary | ICD-10-CM

## 2013-04-19 DIAGNOSIS — Z23 Encounter for immunization: Secondary | ICD-10-CM

## 2013-04-19 NOTE — Patient Instructions (Addendum)
A thyroid blood test is requested for you today.  We'll contact you with results. Refer to a heart specialist.  you will receive a phone call, about a day and time for an appointment Let's recheck the ultrasound.  you will receive a phone call, about a day and time for an appointment Please return in 1 year.

## 2013-04-19 NOTE — [facility] Notes (Signed)
   Subjective:    Patient ID: Crystal Barnett, female    DOB: [birth year], 72 y.o.   MRN: [id number]  HPI pt has h/o multinodular goiter.  bilat bxs were benign in 2009.  The cytology did not show chronic thyroiditis, but she has chronic hypothyroidism.  she says she seldom notices the goiter, having just slight swelling at the anterior neck.  No assoc sxs.  pt states she feels well in general, except for weight change.  Denies palpitations. Past Medical History  Diagnosis Date  . GOITER, MULTINODULAR 01/12/2008  . HYPOTHYROIDISM 09/07/2008  . ALLERGIC RHINITIS 01/12/2008  . Depression   . Hypertension   . Hyperlipidemia     Past Surgical History  Procedure Laterality Date  . Cholecystectomy  1985  . Breast surgery  2008    Breast Biopsy   . Arthroscopic left knee  2005    History   Social History  . Marital Status: Single    Spouse Name: N/A    Number of Children: N/A  . Years of Education: N/A   Occupational History  . 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 Smoking status: Former Smoker    Quit date: 05/13/1990  . Smokeless tobacco: Not on file  . Alcohol Use: Not on file  . Drug Use: Not on file  . Sexual Activity: Not on file   Other Topics Concern  . Not on file   Social History Narrative  . No narrative on file    Current Outpatient Prescriptions on File Prior to Visit  Medication Sig Dispense Refill  . estradiol (ESTRACE) 0.1 MG/GM vaginal cream Use three times a month       . levothyroxine (SYNTHROID, LEVOTHROID) 50 MCG tablet Take 1 tablet (50 mcg total) by mouth daily.  90 tablet  3  . lisinopril-hydrochlorothiazide (PRINZIDE,ZESTORETIC) 20-12.5 MG per tablet Take 1 tablet by mouth daily.        Marland Kitchen loratadine (CLARITIN) 10 MG tablet Take 10 mg by mouth daily.        . metFORMIN (GLUCOPHAGE) 500 MG tablet Take 1 tablet by mouth once daily       . raloxifene (EVISTA) 60 MG tablet Take 60 mg by  mouth daily.         No current facility-administered medications on file prior to visit.    No Known Allergies  Family History  Problem Relation Age of Onset  . Thyroid disease Neg Hx   . Goiter Neg Hx    BP 130/86  Pulse 76  Temp(Src) 98 F (36.7 C) (Oral)  Ht 5' 4.5" (1.638 m)  Wt 245 lb (111.131 kg)  BMI 41.42 kg/m2  SpO2 96%  Review of Systems Denies chest pain and sob.    Objective:   Physical Exam VITAL SIGNS:  See vs page GENERAL: no distress. Neck:  large bilat multinodular goiter.  Lab Results  Component Value Date   TSH 1.53 04/19/2013      Assessment & Plan:  Hypothyroidism: well-replaced Multinodular goiter, clinically unchanged. AF, incidentally noted, apparently new.

## 2013-04-22 DIAGNOSIS — Z79899 Other long term (current) drug therapy: Secondary | ICD-10-CM | POA: Diagnosis not present

## 2013-04-22 DIAGNOSIS — E119 Type 2 diabetes mellitus without complications: Secondary | ICD-10-CM | POA: Diagnosis not present

## 2013-04-22 DIAGNOSIS — E559 Vitamin D deficiency, unspecified: Secondary | ICD-10-CM | POA: Diagnosis not present

## 2013-04-22 DIAGNOSIS — E039 Hypothyroidism, unspecified: Secondary | ICD-10-CM | POA: Diagnosis not present

## 2013-04-22 DIAGNOSIS — E7800 Pure hypercholesterolemia, unspecified: Secondary | ICD-10-CM | POA: Diagnosis not present

## 2013-04-22 DIAGNOSIS — Z1331 Encounter for screening for depression: Secondary | ICD-10-CM | POA: Diagnosis not present

## 2013-04-22 DIAGNOSIS — I10 Essential (primary) hypertension: Secondary | ICD-10-CM | POA: Diagnosis not present

## 2013-04-23 ENCOUNTER — Encounter: Payer: Self-pay | Admitting: Cardiology

## 2013-04-23 ENCOUNTER — Ambulatory Visit (INDEPENDENT_AMBULATORY_CARE_PROVIDER_SITE_OTHER): Payer: Medicare Other | Admitting: Cardiology

## 2013-04-23 VITALS — BP 147/77 | HR 81 | Ht 64.5 in | Wt 242.1 lb

## 2013-04-23 DIAGNOSIS — I499 Cardiac arrhythmia, unspecified: Secondary | ICD-10-CM

## 2013-04-23 DIAGNOSIS — I4891 Unspecified atrial fibrillation: Secondary | ICD-10-CM

## 2013-04-23 NOTE — Patient Instructions (Signed)
The current medical regimen is effective;  continue present plan and medications.  Your physician has recommended that you wear a holter monitor. Holter monitors are medical devices that record the heart's electrical activity. Doctors most often use these monitors to diagnose arrhythmias. Arrhythmias are problems with the speed or rhythm of the heartbeat. The monitor is a small, portable device. You can wear one while you do your normal daily activities. This is usually used to diagnose what is causing palpitations/syncope (passing out).  Follow up with be based on these results.

## 2013-04-23 NOTE — [facility] Notes (Signed)
HPI The patient presents for evaluation of an arrhythmia. She was seen in her primary care office for routine visit and was noted to have some irregular heartbeat. EKG was obtained and that there was frequent ectopy and a suggestion I. The computer of atrial fibrillation. However, it appears to be sinus rhythm with premature atrial contractions. There were no other acute changes. The patient has had no prior cardiac history. She's never had any cardiac workup. She denies any symptoms such as palpitations, presyncope or syncope. She has no chest pressure, neck or arm discomfort. She has no weight gain or edema. She climbs an incline to her office and she does get winded with this but this is not new. She does some light housekeeping including vacuuming but she does not exercise routinely.   No Known Allergies  Current Outpatient Prescriptions  Medication Sig Dispense Refill  . cholecalciferol (VITAMIN D) 1000 UNITS tablet Take 2,000 Units by mouth daily.      Marland Kitchen estradiol (ESTRACE) 0.1 MG/GM vaginal cream Use three times a month       . levothyroxine (SYNTHROID, LEVOTHROID) 50 MCG tablet Take 1 tablet (50 mcg total) by mouth daily.  90 tablet  3  . lisinopril-hydrochlorothiazide (PRINZIDE,ZESTORETIC) 20-12.5 MG per tablet Take 1 tablet by mouth daily.        Marland Kitchen loratadine (CLARITIN) 10 MG tablet Take 10 mg by mouth daily.        . metFORMIN (GLUCOPHAGE) 500 MG tablet Take 1 tablet by mouth once daily       . Multiple Vitamin (MULTIVITAMIN) tablet Take 1 tablet by mouth daily.      . raloxifene (EVISTA) 60 MG tablet Take 60 mg by mouth daily.         No current facility-administered medications for this visit.    Past Medical History  Diagnosis Date  . GOITER, MULTINODULAR 01/12/2008  . HYPOTHYROIDISM 09/07/2008  . ALLERGIC RHINITIS 01/12/2008  . Depression   . Hypertension   . Hyperlipidemia     Past Surgical History  Procedure Laterality Date  . Cholecystectomy  1985  . Breast surgery   2008    Breast Biopsy   . Arthroscopic left knee  2005    Family History  Problem Relation Age of Onset  . Thyroid disease Neg Hx   . Goiter Neg Hx     History   Social History  . Marital Status: Single    Spouse Name: N/A    Number of Children: N/A  . Years of Education: N/A   Occupational History  . 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 Smoking status: Former Smoker    Quit date: 05/13/1990  . Smokeless tobacco: Not on file  . Alcohol Use: Not on file  . Drug Use: Not on file  . Sexual Activity: Not on file   Other Topics Concern  . Not on file   Social History Narrative  . No narrative on file    ROS:   Positive for vertigo , difficulty swallowing , joint pains, back pain, rhinitis.  Otherwise as stated in the HPI and negative for all other systems.  PHYSICAL EXAM BP 147/77  Pulse 81  Ht 5' 4.5" (1.638 m)  Wt 242 lb 1.6 oz (109.816 kg)  BMI 40.93 kg/m2 GENERAL:  Well appearing HEENT:  Pupils equal round and reactive, fundi not visualized, oral mucosa unremarkable NECK:  No  jugular venous distention, waveform within normal limits, carotid upstroke brisk and symmetric, no bruits, no thyromegaly, goiter LYMPHATICS:  No cervical, inguinal adenopathy LUNGS:  Clear to auscultation bilaterally BACK:  No CVA tenderness CHEST:  Unremarkable HEART:  PMI not displaced or sustained,S1 and S2 within normal limits, no S3, no S4, no clicks, no rubs, no murmurs ABD:  Flat, positive bowel sounds normal in frequency in pitch, no bruits, no rebound, no guarding, no midline pulsatile mass, no hepatomegaly, no splenomegaly EXT:  2 plus pulses throughout, mild bilateral edema, no cyanosis no clubbing SKIN:  No rashes no nodules NEURO:  Cranial nerves II through XII grossly intact, motor grossly intact throughout PSYCH:  Cognitively intact, oriented to person place and time   EKG:   Normal sinus rhythm, rate 78,  axis within normal limits, intervals within normal limits,  Probable sinus arrhythmia , nonspecific ST-T wave changes.  04/23/2013   ASSESSMENT AND PLAN   ARRHYTHMIA:   The original EKG did not demonstrate atrial fibrillation but frequent atrial ectopy probably multifocal. I will apply a 24-hour Holter monitor to further clarify the do not suspect that further  Treatment will be indicated. She probably has some sleep apnea and is at risk for dysrhythmias. However, her thyroid was unremarkable.  HTN:   Her blood pressure is elevated today but she says this is unusual. She can follow this with the usual blood pressure readings and management as necessary she will continue the previous medications. Certainly weight loss shouldn't be a component to her blood pressure management.

## 2013-04-26 ENCOUNTER — Ambulatory Visit
Admission: RE | Admit: 2013-04-26 | Discharge: 2013-04-26 | Disposition: A | Discharge status: Home / Self Care | Payer: Medicare Other | Source: Ambulatory Visit | Attending: Endocrinology | Admitting: Endocrinology

## 2013-04-26 DIAGNOSIS — E042 Nontoxic multinodular goiter: Secondary | ICD-10-CM | POA: Diagnosis not present

## 2013-05-12 ENCOUNTER — Telehealth: Payer: Self-pay | Admitting: *Deleted

## 2013-05-12 NOTE — Telephone Encounter (Signed)
Although monitor placed at [facility] location, results sent to Dr. Percival Spanish to review and contact pt.  Medical Records is scanning report now to Dr. Rosezella [place] In [place].

## 2013-05-12 NOTE — Telephone Encounter (Signed)
Called wanting results of monitor she had placed 10 days ago.  States she saw Dr. Percival Spanish at the [facility] office and monitor was placed at the [facility] location.  Advised her to call back to [facility] because the monitor results should be sent there per our holter lab.

## 2013-05-12 NOTE — Telephone Encounter (Signed)
Pt called stating that she returned her monitor over 10 days ago and still has not heard anything back yet.

## 2013-05-12 NOTE — Telephone Encounter (Signed)
Message forwarded to Triage at the [facility] location for Dr. Percival Spanish.

## 2013-05-12 NOTE — Telephone Encounter (Signed)
Forward to Dr. Percival Spanish, Kelli Churn, RN

## 2013-05-18 ENCOUNTER — Telehealth: Payer: Self-pay | Admitting: Cardiology

## 2013-05-18 NOTE — Telephone Encounter (Signed)
Discussed with shelley wells, the results are in the chart but not located in an area that is easily found. Spoke with pt, aware monitor reviewed by dr hochrein shows no atrial fib, rare ectopy, no further work up needed. Pt questioned how to get the atrial fib removed from her chart for insurance purposes. Explained to pt she will need to contact her PCP that placed that diagnosis in the chart. Patient voiced understanding. Copy of monitor and office note mailed to pt at her request. Monitor taken to medical records for scanning into the chart.

## 2013-05-18 NOTE — Telephone Encounter (Signed)
New message      Pt wants results from heart monitor done in december

## 2013-05-27 DIAGNOSIS — Z961 Presence of intraocular lens: Secondary | ICD-10-CM | POA: Diagnosis not present

## 2013-07-05 ENCOUNTER — Other Ambulatory Visit: Payer: Self-pay

## 2013-07-05 MED ORDER — LEVOTHYROXINE SODIUM 50 MCG PO TABS
50.0000 ug | ORAL_TABLET | Freq: Every day | ORAL | Status: DC
Start: 2013-07-05 — End: 2013-10-13

## 2013-10-13 ENCOUNTER — Other Ambulatory Visit: Payer: Self-pay | Admitting: Endocrinology

## 2013-10-27 DIAGNOSIS — E039 Hypothyroidism, unspecified: Secondary | ICD-10-CM | POA: Diagnosis not present

## 2013-10-27 DIAGNOSIS — D696 Thrombocytopenia, unspecified: Secondary | ICD-10-CM | POA: Diagnosis not present

## 2013-10-27 DIAGNOSIS — E119 Type 2 diabetes mellitus without complications: Secondary | ICD-10-CM | POA: Diagnosis not present

## 2013-10-27 DIAGNOSIS — Z23 Encounter for immunization: Secondary | ICD-10-CM | POA: Diagnosis not present

## 2013-10-27 DIAGNOSIS — E7800 Pure hypercholesterolemia, unspecified: Secondary | ICD-10-CM | POA: Diagnosis not present

## 2013-10-27 DIAGNOSIS — E559 Vitamin D deficiency, unspecified: Secondary | ICD-10-CM | POA: Diagnosis not present

## 2013-10-27 DIAGNOSIS — I10 Essential (primary) hypertension: Secondary | ICD-10-CM | POA: Diagnosis not present

## 2013-12-23 ENCOUNTER — Other Ambulatory Visit: Payer: Self-pay | Admitting: Endocrinology

## 2014-01-28 DIAGNOSIS — J309 Allergic rhinitis, unspecified: Secondary | ICD-10-CM | POA: Diagnosis not present

## 2014-01-28 DIAGNOSIS — H00019 Hordeolum externum unspecified eye, unspecified eyelid: Secondary | ICD-10-CM | POA: Diagnosis not present

## 2014-02-10 DIAGNOSIS — Z23 Encounter for immunization: Secondary | ICD-10-CM | POA: Diagnosis not present

## 2014-03-01 DIAGNOSIS — H0289 Other specified disorders of eyelid: Secondary | ICD-10-CM | POA: Diagnosis not present

## 2014-03-02 ENCOUNTER — Other Ambulatory Visit: Payer: Self-pay | Admitting: Endocrinology

## 2014-03-03 ENCOUNTER — Other Ambulatory Visit: Payer: Self-pay

## 2014-03-03 DIAGNOSIS — Z1239 Encounter for other screening for malignant neoplasm of breast: Secondary | ICD-10-CM

## 2014-03-03 DIAGNOSIS — Z1231 Encounter for screening mammogram for malignant neoplasm of breast: Secondary | ICD-10-CM

## 2014-03-07 DIAGNOSIS — H0015 Chalazion left lower eyelid: Secondary | ICD-10-CM | POA: Diagnosis not present

## 2014-03-14 DIAGNOSIS — H0015 Chalazion left lower eyelid: Secondary | ICD-10-CM | POA: Diagnosis not present

## 2014-03-25 ENCOUNTER — Ambulatory Visit: Payer: Medicare Other

## 2014-03-30 ENCOUNTER — Other Ambulatory Visit: Payer: Self-pay | Admitting: Obstetrics and Gynecology

## 2014-03-30 DIAGNOSIS — Z124 Encounter for screening for malignant neoplasm of cervix: Secondary | ICD-10-CM | POA: Diagnosis not present

## 2014-03-31 LAB — CYTOLOGY - PAP

## 2014-04-18 ENCOUNTER — Ambulatory Visit
Admission: RE | Admit: 2014-04-18 | Discharge: 2014-04-18 | Disposition: A | Discharge status: Home / Self Care | Payer: Medicare Other | Source: Ambulatory Visit

## 2014-04-18 DIAGNOSIS — Z1231 Encounter for screening mammogram for malignant neoplasm of breast: Secondary | ICD-10-CM

## 2014-04-20 ENCOUNTER — Ambulatory Visit (INDEPENDENT_AMBULATORY_CARE_PROVIDER_SITE_OTHER): Payer: Medicare Other | Admitting: Endocrinology

## 2014-04-20 ENCOUNTER — Encounter: Payer: Self-pay | Admitting: Endocrinology

## 2014-04-20 VITALS — BP 130/80 | HR 80 | Temp 97.8°F | Ht 64.5 in | Wt 240.0 lb

## 2014-04-20 DIAGNOSIS — E039 Hypothyroidism, unspecified: Secondary | ICD-10-CM

## 2014-04-20 LAB — TSH: TSH: 1.63 u[IU]/mL (ref 0.35–4.50)

## 2014-04-20 NOTE — [facility] Notes (Signed)
   Subjective:    Patient ID: Crystal Barnett, female    DOB: 07-09-1940, 73 y.o.   MRN: [id number]  HPI pt returns for f/u of multinodular goiter (dx'ed 2009, when bilat bxs were benign in 2009; the cytology did not show chronic thyroiditis, but she has chronic hypothyroidism; she has been on synthroid since 2009).  She does not notice the goiter.  She denies sob. Past Medical History  Diagnosis Date  . GOITER, MULTINODULAR 01/12/2008  . HYPOTHYROIDISM 09/07/2008  . ALLERGIC RHINITIS 01/12/2008  . Depression   . Hypertension   . Hyperlipidemia     Past Surgical History  Procedure Laterality Date  . Cholecystectomy  1985  . Breast surgery  2008    Breast Biopsy   . Arthroscopic left knee  2005    History   Social History  . Marital Status: Single    Spouse Name: N/A    Number of Children: N/A  . Years of Education: N/A   Occupational History  . 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 Smoking status: Former Smoker    Quit date: 05/13/1990  . Smokeless tobacco: Not on file  . Alcohol Use: Not on file  . Drug Use: Not on file  . Sexual Activity: Not on file   Other Topics Concern  . Not on file   Social History Narrative    Current Outpatient Prescriptions on File Prior to Visit  Medication Sig Dispense Refill  . cholecalciferol (VITAMIN D) 1000 UNITS tablet Take 2,000 Units by mouth daily.    Marland Kitchen estradiol (ESTRACE) 0.1 MG/GM vaginal cream Use three times a month     . levothyroxine (SYNTHROID, LEVOTHROID) 50 MCG tablet TAKE 1 TABLET EVERY DAY 90 tablet 0  . lisinopril-hydrochlorothiazide (PRINZIDE,ZESTORETIC) 20-12.5 MG per tablet Take 1 tablet by mouth daily.      . metFORMIN (GLUCOPHAGE) 500 MG tablet Take 1 tablet by mouth once daily     . Multiple Vitamin (MULTIVITAMIN) tablet Take 1 tablet by mouth daily.    . raloxifene (EVISTA) 60 MG tablet Take 60 mg by mouth daily.       No current  facility-administered medications on file prior to visit.    No Known Allergies  Family History  Problem Relation Age of Onset  . Goiter Maternal Grandmother   . Stroke Father 80    BP 130/80 mmHg  Pulse 80  Temp(Src) 97.8 F (36.6 C) (Oral)  Ht 5' 4.5" (1.638 m)  Wt 240 lb (108.863 kg)  BMI 40.57 kg/m2  SpO2 92%    Review of Systems Denies dysphagia.     Objective:   Physical Exam VITAL SIGNS:  See vs page. GENERAL: no distress. Neck: large bilat multinodular goiter is again noted.     Lab Results  Component Value Date   TSH 1.63 04/20/2014      Assessment & Plan:  Hypothyroidism: well-replaced.  The relationship between this and goiter is uncertain, given the cytology. Multinodular goiter, clinically unchanged.  Patient is advised the following: Patient Instructions  blood tests are being requested for you today.  We'll let you know about the results.   Please return in 1 year, when you will be due to recheck the ultrasound

## 2014-04-20 NOTE — Patient Instructions (Signed)
blood tests are being requested for you today.  We'll let you know about the results.   Please return in 1 year, when you will be due to recheck the ultrasound

## 2014-06-10 ENCOUNTER — Other Ambulatory Visit: Payer: Self-pay | Admitting: Endocrinology

## 2014-09-24 DIAGNOSIS — J069 Acute upper respiratory infection, unspecified: Secondary | ICD-10-CM | POA: Diagnosis not present

## 2014-10-19 ENCOUNTER — Other Ambulatory Visit: Payer: Self-pay

## 2014-10-19 DIAGNOSIS — Z1231 Encounter for screening mammogram for malignant neoplasm of breast: Secondary | ICD-10-CM

## 2014-10-21 ENCOUNTER — Other Ambulatory Visit: Payer: Self-pay | Admitting: Endocrinology

## 2014-10-27 ENCOUNTER — Other Ambulatory Visit: Payer: Self-pay

## 2014-10-27 ENCOUNTER — Telehealth: Payer: Self-pay | Admitting: Endocrinology

## 2014-10-27 MED ORDER — LEVOTHYROXINE SODIUM 50 MCG PO TABS: 50.0000 ug | ORAL_TABLET | Freq: Every day | ORAL | Status: DC

## 2014-10-27 NOTE — Telephone Encounter (Signed)
I contacted the pt. Pt stated the levothyroxine does not need a PA. Pt stated [facility] did not have a active rx for the Levothyroxine. Pt advised rx has been resubmitted and to contact our office if she has any other issues.

## 2014-10-27 NOTE — Telephone Encounter (Signed)
Pt needs PA for levothyroxine please  The form should have come over yesterday? Fax # [phone] [email or web address]

## 2014-12-27 DIAGNOSIS — J302 Other seasonal allergic rhinitis: Secondary | ICD-10-CM | POA: Diagnosis not present

## 2014-12-27 DIAGNOSIS — Z1389 Encounter for screening for other disorder: Secondary | ICD-10-CM | POA: Diagnosis not present

## 2014-12-27 DIAGNOSIS — E119 Type 2 diabetes mellitus without complications: Secondary | ICD-10-CM | POA: Diagnosis not present

## 2014-12-27 DIAGNOSIS — E559 Vitamin D deficiency, unspecified: Secondary | ICD-10-CM | POA: Diagnosis not present

## 2014-12-27 DIAGNOSIS — D696 Thrombocytopenia, unspecified: Secondary | ICD-10-CM | POA: Diagnosis not present

## 2014-12-27 DIAGNOSIS — E780 Pure hypercholesterolemia: Secondary | ICD-10-CM | POA: Diagnosis not present

## 2014-12-27 DIAGNOSIS — I10 Essential (primary) hypertension: Secondary | ICD-10-CM | POA: Diagnosis not present

## 2015-01-26 DIAGNOSIS — E119 Type 2 diabetes mellitus without complications: Secondary | ICD-10-CM | POA: Diagnosis not present

## 2015-01-26 DIAGNOSIS — Z961 Presence of intraocular lens: Secondary | ICD-10-CM | POA: Diagnosis not present

## 2015-01-30 DIAGNOSIS — D696 Thrombocytopenia, unspecified: Secondary | ICD-10-CM | POA: Diagnosis not present

## 2015-02-08 DIAGNOSIS — H26492 Other secondary cataract, left eye: Secondary | ICD-10-CM | POA: Diagnosis not present

## 2015-02-08 DIAGNOSIS — H2640 Unspecified secondary cataract: Secondary | ICD-10-CM | POA: Diagnosis not present

## 2015-02-27 ENCOUNTER — Telehealth: Payer: Self-pay | Admitting: Hematology & Oncology

## 2015-02-27 NOTE — Telephone Encounter (Signed)
i called and left Vm of New patient apt date/time.  Also asked patient to call office back to confirm

## 2015-02-28 ENCOUNTER — Telehealth: Payer: Self-pay | Admitting: Hematology

## 2015-02-28 NOTE — Telephone Encounter (Signed)
PT AWARE OF NP APPT 03/20/15 @10 :45 DX-THROMBOCYTOPENIA REFERRING DR Drema Dallas

## 2015-03-02 ENCOUNTER — Telehealth: Payer: Self-pay | Admitting: Hematology & Oncology

## 2015-03-02 NOTE — Telephone Encounter (Signed)
I called patient and left message with new patient apt date/time and asked patient to call office back to confirm apt

## 2015-03-03 ENCOUNTER — Telehealth: Payer: Self-pay | Admitting: Hematology & Oncology

## 2015-03-03 NOTE — Telephone Encounter (Signed)
Patient called and cx 04/19/15 New Patient apt.  She stated she will be moving back to [facility] and she will resch apt for a [facility] MD.

## 2015-03-20 ENCOUNTER — Telehealth: Payer: Self-pay | Admitting: Hematology

## 2015-03-20 ENCOUNTER — Encounter: Payer: Self-pay | Admitting: Hematology

## 2015-03-20 ENCOUNTER — Ambulatory Visit (HOSPITAL_BASED_OUTPATIENT_CLINIC_OR_DEPARTMENT_OTHER): Payer: Medicare Other | Admitting: Hematology

## 2015-03-20 ENCOUNTER — Ambulatory Visit (HOSPITAL_BASED_OUTPATIENT_CLINIC_OR_DEPARTMENT_OTHER): Payer: Medicare Other

## 2015-03-20 VITALS — BP 138/64 | HR 78 | Temp 97.9°F | Resp 20 | Ht 64.0 in | Wt 238.6 lb

## 2015-03-20 DIAGNOSIS — D696 Thrombocytopenia, unspecified: Secondary | ICD-10-CM

## 2015-03-20 DIAGNOSIS — E039 Hypothyroidism, unspecified: Secondary | ICD-10-CM

## 2015-03-20 DIAGNOSIS — Z23 Encounter for immunization: Secondary | ICD-10-CM | POA: Diagnosis not present

## 2015-03-20 DIAGNOSIS — D72819 Decreased white blood cell count, unspecified: Secondary | ICD-10-CM | POA: Diagnosis not present

## 2015-03-20 LAB — CBC & DIFF AND RETIC
BASO%: 0.2 % (ref 0.0–2.0)
Basophils Absolute: 0 10*3/uL (ref 0.0–0.1)
EOS%: 2.3 % (ref 0.0–7.0)
Eosinophils Absolute: 0.1 10*3/uL (ref 0.0–0.5)
HCT: 40.2 % (ref 34.8–46.6)
HGB: 13.9 g/dL (ref 11.6–15.9)
Immature Retic Fract: 4.3 % (ref 1.60–10.00)
LYMPH#: 1.4 10*3/uL (ref 0.9–3.3)
LYMPH%: 32.8 % (ref 14.0–49.7)
MCH: 31.6 pg (ref 25.1–34.0)
MCHC: 34.6 g/dL (ref 31.5–36.0)
MCV: 91.4 fL (ref 79.5–101.0)
MONO#: 0.4 10*3/uL (ref 0.1–0.9)
MONO%: 8.3 % (ref 0.0–14.0)
NEUT#: 2.4 10*3/uL (ref 1.5–6.5)
NEUT%: 56.4 % (ref 38.4–76.8)
RBC: 4.4 10*6/uL (ref 3.70–5.45)
RDW: 13.3 % (ref 11.2–14.5)
RETIC %: 1.95 % (ref 0.70–2.10)
RETIC CT ABS: 85.8 10*3/uL (ref 33.70–90.70)
WBC: 4.3 10*3/uL (ref 3.9–10.3)

## 2015-03-20 LAB — COMPREHENSIVE METABOLIC PANEL (CC13)
ALK PHOS: 66 U/L (ref 40–150)
ALT: 42 U/L (ref 0–55)
ANION GAP: 9 meq/L (ref 3–11)
AST: 51 U/L — ABNORMAL HIGH (ref 5–34)
Albumin: 3.7 g/dL (ref 3.5–5.0)
BILIRUBIN TOTAL: 0.69 mg/dL (ref 0.20–1.20)
BUN: 13.2 mg/dL (ref 7.0–26.0)
CALCIUM: 9.3 mg/dL (ref 8.4–10.4)
CHLORIDE: 107 meq/L (ref 98–109)
CO2: 23 meq/L (ref 22–29)
CREATININE: 0.7 mg/dL (ref 0.6–1.1)
EGFR: 86 mL/min/{1.73_m2} — ABNORMAL LOW (ref >90)
Glucose: 118 mg/dl (ref 70–140)
POTASSIUM: 3.9 meq/L (ref 3.5–5.1)
Sodium: 139 mEq/L (ref 136–145)
Total Protein: 6.8 g/dL (ref 6.4–8.3)

## 2015-03-20 LAB — CHCC SMEAR

## 2015-03-20 LAB — LACTATE DEHYDROGENASE (CC13): LDH: 175 U/L (ref 125–245)

## 2015-03-20 LAB — TSH CHCC: TSH: 1.071 m[IU]/L (ref 0.308–3.960)

## 2015-03-20 NOTE — Telephone Encounter (Signed)
Gave and printed appts ched and avs for pt for DEC  °

## 2015-03-20 NOTE — [facility] Notes (Signed)
Marland Kitchen    HEMATOLOGY/ONCOLOGY CONSULTATION NOTE  Date of Service: 03/20/2015  Patient Care Team: Leighton Ruff, MD as PCP - General (Family Medicine)  CHIEF COMPLAINTS/PURPOSE OF CONSULTATION:  thrombocytopenia  HISTORY OF PRESENTING ILLNESS:  Crystal Barnett is a wonderful 74 y.o. female who has been referred to [place] by Dr .Gerrit Heck, MD  for evaluation and management of thrombocytopenia. 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 According to labs in our system she has had platelet counts in the 120,000 range given in 2011. Patient had labs and with her primary care physician on 01/30/2015 that showed that her platelet counts were down to 82k with normal hemoglobin of 13.1 and slightly lower WBC count of 3.4k with ANC of 1.8k. Patient was referred to [place] for further evaluation of her thrombocytopenia. Patient notes no issues with bleeding or bruising. No upcoming surgeries. No black stools/blood in the urine/nosebleeds/gum bleeding or other overt blood loss. She notes no recent new medications. No recent symptoms suggesting an acute viral infection. Her labs repeated in our clinic and her CBC showed platelet counts of 92k with a normal hemoglobin and WBC count. Patient reports no fevers or chills or other acute infections.   MEDICAL HISTORY:  Past Medical History  Diagnosis Date  . GOITER, MULTINODULAR 01/12/2008  . HYPOTHYROIDISM 09/07/2008  . ALLERGIC RHINITIS 01/12/2008  . Depression   . Hypertension   . Hyperlipidemia   . Thrombocytopenia ([place])     Appears chronic from at least 2014  . 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 Diabetes ([place])   . Fatty liver   . Vitamin D deficiency     SURGICAL HISTORY: Past Surgical History  Procedure Laterality Date  . Cholecystectomy  1985    . Breast surgery  2008    Breast Biopsy   . Arthroscopic left knee  2005    SOCIAL HISTORY: Social History   Social History  . Marital Status: Single    Spouse Name: N/A  . Number of Children: N/A  . Years of Education: N/A   Occupational History  . 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 Smoking status: Former Smoker    Quit date: 05/13/1990  . Smokeless tobacco: Not on file  . Alcohol Use: Not on file  . Drug Use: Not on file  . Sexual Activity: Not on file   Other Topics Concern  . Not on file   Social History Narrative    FAMILY HISTORY: Family History  Problem Relation Age of Onset  . Goiter Maternal Grandmother   . Stroke Father 69    ALLERGIES:  has No Known Allergies.  MEDICATIONS:  Current Outpatient Prescriptions  Medication Sig Dispense Refill  . cholecalciferol (VITAMIN D) 1000 UNITS tablet Take 2,000 Units by mouth daily.    Marland Kitchen estradiol (ESTRACE) 0.1 MG/GM vaginal cream Use three times a month     . levothyroxine (SYNTHROID, LEVOTHROID) 50 MCG tablet Take 1 tablet (50 mcg total) by mouth daily. 90 tablet 1  . lisinopril-hydrochlorothiazide (PRINZIDE,ZESTORETIC) 20-12.5 MG per tablet Take 1 tablet by mouth daily.      . metFORMIN (GLUCOPHAGE) 500 MG tablet Take 1 tablet by mouth once daily     .  montelukast (SINGULAIR) 10 MG tablet     . Multiple Vitamin (MULTIVITAMIN) tablet Take 1 tablet by mouth daily.    . raloxifene (EVISTA) 60 MG tablet Take 60 mg by mouth daily.       No current facility-administered medications for this visit.    REVIEW OF SYSTEMS:    10 Point review of Systems was done is negative except as noted above.  PHYSICAL EXAMINATION: ECOG PERFORMANCE STATUS: 1 - Symptomatic but completely ambulatory  . Filed Vitals:   03/20/15 1111  BP: 138/64  Pulse: 78  Temp: 97.9 F (36.6 C)  Resp: 20   Filed Weights   03/20/15 1111  Weight: 238 lb 9.6 oz (108.228  kg)   .Body mass index is 40.94 kg/(m^2).  GENERAL:alert, in no acute distress and comfortable SKIN: skin color, texture, turgor are normal, no rashes or significant lesions EYES: normal, conjunctiva are pink and non-injected, sclera clear OROPHARYNX:no exudate, no erythema and lips, buccal mucosa, and tongue normal  NECK: supple, no JVD, thyroid normal size, non-tender, without nodularity LYMPH:  no palpable lymphadenopathy in the cervical, axillary or inguinal LUNGS: clear to auscultation with normal respiratory effort HEART: regular rate & rhythm,  no murmurs and no lower extremity edema ABDOMEN: abdomen obese, soft, non-tender, normoactive bowel sounds  Musculoskeletal: no cyanosis of digits and no clubbing  PSYCH: alert & oriented x 3 with fluent speech. NEURO: no focal motor/sensory deficits  LABORATORY DATA:  I have reviewed the data as listed  . CBC Latest Ref Rng  03/20/2015 01/30/2010  WBC 3.9 - 10.3 10e3/uL  4.3 4.7  Hemoglobin 11.6 - 15.9 g/dL  13.9 13.2  Hematocrit 34.8 - 46.6 %  40.2 38.1  Platelets 145 - 400 10e3/uL  92 Large platelets present(L) 125(L)    . CMP Latest Ref Rng  03/20/2015 01/30/2010  Glucose 70 - 140 mg/dl  118 119(H)  BUN 7.0 - 26.0 mg/dL  13.2 8  Creatinine 0.6 - 1.1 mg/dL  0.7 0.64  Sodium 136 - 145 mEq/L  139 138  Potassium 3.5 - 5.1 mEq/L  3.9 4.2  Chloride 96 - 112 mEq/L  - 104  CO2 22 - 29 mEq/L  23 27  Calcium 8.4 - 10.4 mg/dL  9.3 9.0  Total Protein 6.4 - 8.3 g/dL  6.8 -  Total Bilirubin 0.20 - 1.20 mg/dL  0.69 -  Alkaline Phos 40 - 150 U/L  66 -  AST 5 - 34 U/L  51(H) -  ALT 0 - 55 U/L  42 -   RADIOGRAPHIC STUDIES: I have personally reviewed the radiological images as listed and agreed with the findings in the report. No results found.  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 No issues with bleeding or easy bruisability. No acute new medications. No overt  symptoms suggestive of a recent viral infection. No history of blood transfusions or IV drug use. Plan -No indication for acute treatment of the patient's mild-to-moderate thrombocytopenia at this time. -We will check a peripheral blood smear to rule out pseudothrombocytopenia down to look for other clues about the etiology. -Vitamin B12 level, TSH, LDH. -Given history of fatty liver we'll get an ultrasound of the abdomen check for liver pathology and splenomegaly and low platelets.   return to care with Dr. Irene Limbo in 4 weeks CBC, CMP to discuss the results of the workup.   All  of the patients questions were answereto her apparent satisfaction.  The patient knows to call the clinic with any problems, questions or concerns.  I spent 45 minutes counseling the patient face to face. The total time spent in the appointment was 60 minutes and more than 50% was on counseling and direct patient cares.    Sullivan Lone MD [place] AAHIVMS [facility] Hematology/Oncology Physician [facility]  (Office):       [phone] (Work cell):  [phone] (Fax):           [phone]  03/20/2015 10:48 AM

## 2015-03-21 LAB — VITAMIN B12: VITAMIN B 12: 581 pg/mL (ref 211–911)

## 2015-03-21 LAB — SEDIMENTATION RATE: SED RATE: 4 mm/h (ref 0–30)

## 2015-03-28 ENCOUNTER — Ambulatory Visit (HOSPITAL_COMMUNITY)
Admission: RE | Admit: 2015-03-28 | Discharge: 2015-03-28 | Disposition: A | Discharge status: Home / Self Care | Payer: Medicare Other | Source: Ambulatory Visit | Attending: Hematology | Admitting: Hematology

## 2015-03-28 DIAGNOSIS — D696 Thrombocytopenia, unspecified: Secondary | ICD-10-CM | POA: Diagnosis not present

## 2015-03-28 DIAGNOSIS — Z9049 Acquired absence of other specified parts of digestive tract: Secondary | ICD-10-CM | POA: Diagnosis not present

## 2015-03-28 DIAGNOSIS — K760 Fatty (change of) liver, not elsewhere classified: Secondary | ICD-10-CM | POA: Insufficient documentation

## 2015-03-28 DIAGNOSIS — R932 Abnormal findings on diagnostic imaging of liver and biliary tract: Secondary | ICD-10-CM | POA: Diagnosis not present

## 2015-03-28 DIAGNOSIS — R161 Splenomegaly, not elsewhere classified: Secondary | ICD-10-CM | POA: Diagnosis present

## 2015-03-28 DIAGNOSIS — E039 Hypothyroidism, unspecified: Secondary | ICD-10-CM | POA: Diagnosis not present

## 2015-03-28 DIAGNOSIS — D72819 Decreased white blood cell count, unspecified: Secondary | ICD-10-CM | POA: Insufficient documentation

## 2015-03-28 DIAGNOSIS — D7389 Other diseases of spleen: Secondary | ICD-10-CM | POA: Diagnosis not present

## 2015-04-07 ENCOUNTER — Telehealth: Payer: Self-pay | Admitting: Hematology

## 2015-04-07 NOTE — Telephone Encounter (Signed)
PAL - moved 12/5 f/u to 12/7. Spoke with patient she is aware.

## 2015-04-14 ENCOUNTER — Ambulatory Visit: Payer: Medicare Other | Admitting: Hematology & Oncology

## 2015-04-14 ENCOUNTER — Ambulatory Visit: Payer: Medicare Other

## 2015-04-14 ENCOUNTER — Other Ambulatory Visit: Payer: Medicare Other

## 2015-04-17 ENCOUNTER — Ambulatory Visit: Payer: Medicare Other | Admitting: Hematology

## 2015-04-19 ENCOUNTER — Encounter: Payer: Self-pay | Admitting: Hematology

## 2015-04-19 ENCOUNTER — Ambulatory Visit: Payer: Medicare Other | Admitting: Hematology & Oncology

## 2015-04-19 ENCOUNTER — Telehealth: Payer: Self-pay | Admitting: Hematology

## 2015-04-19 ENCOUNTER — Ambulatory Visit (HOSPITAL_BASED_OUTPATIENT_CLINIC_OR_DEPARTMENT_OTHER): Payer: Medicare Other

## 2015-04-19 ENCOUNTER — Other Ambulatory Visit: Payer: Medicare Other

## 2015-04-19 ENCOUNTER — Ambulatory Visit (HOSPITAL_BASED_OUTPATIENT_CLINIC_OR_DEPARTMENT_OTHER): Payer: Medicare Other | Admitting: Hematology

## 2015-04-19 ENCOUNTER — Ambulatory Visit: Payer: Medicare Other

## 2015-04-19 VITALS — BP 160/77 | HR 80 | Temp 98.1°F | Resp 20 | Ht 64.0 in | Wt 241.7 lb

## 2015-04-19 DIAGNOSIS — K7460 Unspecified cirrhosis of liver: Secondary | ICD-10-CM | POA: Diagnosis not present

## 2015-04-19 DIAGNOSIS — C228 Malignant neoplasm of liver, primary, unspecified as to type: Secondary | ICD-10-CM | POA: Diagnosis not present

## 2015-04-19 DIAGNOSIS — K7581 Nonalcoholic steatohepatitis (NASH): Secondary | ICD-10-CM | POA: Diagnosis not present

## 2015-04-19 DIAGNOSIS — D696 Thrombocytopenia, unspecified: Secondary | ICD-10-CM | POA: Diagnosis not present

## 2015-04-19 DIAGNOSIS — K769 Liver disease, unspecified: Secondary | ICD-10-CM | POA: Diagnosis not present

## 2015-04-19 DIAGNOSIS — R188 Other ascites: Secondary | ICD-10-CM | POA: Insufficient documentation

## 2015-04-19 LAB — CBC & DIFF AND RETIC
BASO%: 0.5 % (ref 0.0–2.0)
Basophils Absolute: 0 10*3/uL (ref 0.0–0.1)
EOS%: 2.9 % (ref 0.0–7.0)
Eosinophils Absolute: 0.1 10*3/uL (ref 0.0–0.5)
HCT: 38.8 % (ref 34.8–46.6)
HGB: 13.3 g/dL (ref 11.6–15.9)
Immature Retic Fract: 7 % (ref 1.60–10.00)
LYMPH%: 36.6 % (ref 14.0–49.7)
MCH: 31.2 pg (ref 25.1–34.0)
MCHC: 34.3 g/dL (ref 31.5–36.0)
MCV: 91.1 fL (ref 79.5–101.0)
MONO#: 0.3 10*3/uL (ref 0.1–0.9)
MONO%: 8.6 % (ref 0.0–14.0)
NEUT%: 51.4 % (ref 38.4–76.8)
NEUTROS ABS: 2 10*3/uL (ref 1.5–6.5)
PLATELETS: 87 10*3/uL — AB (ref 145–400)
RBC: 4.26 10*6/uL (ref 3.70–5.45)
RDW: 13.3 % (ref 11.2–14.5)
Retic %: 2.1 % (ref 0.70–2.10)
Retic Ct Abs: 89.46 10*3/uL (ref 33.70–90.70)
WBC: 3.9 10*3/uL (ref 3.9–10.3)
lymph#: 1.4 10*3/uL (ref 0.9–3.3)

## 2015-04-19 LAB — COMPREHENSIVE METABOLIC PANEL
ALT: 40 U/L (ref 0–55)
ANION GAP: 10 meq/L (ref 3–11)
AST: 50 U/L — ABNORMAL HIGH (ref 5–34)
Albumin: 3.5 g/dL (ref 3.5–5.0)
Alkaline Phosphatase: 64 U/L (ref 40–150)
BILIRUBIN TOTAL: 0.68 mg/dL (ref 0.20–1.20)
BUN: 10.9 mg/dL (ref 7.0–26.0)
CO2: 22 meq/L (ref 22–29)
Calcium: 9.2 mg/dL (ref 8.4–10.4)
Chloride: 107 mEq/L (ref 98–109)
Creatinine: 0.7 mg/dL (ref 0.6–1.1)
EGFR: 82 mL/min/{1.73_m2} — AB (ref >90)
Glucose: 143 mg/dl — ABNORMAL HIGH (ref 70–140)
POTASSIUM: 4.1 meq/L (ref 3.5–5.1)
SODIUM: 139 meq/L (ref 136–145)
TOTAL PROTEIN: 6.7 g/dL (ref 6.4–8.3)

## 2015-04-19 NOTE — Telephone Encounter (Signed)
per pof to sch pt appt-gave pt copy of avs °

## 2015-04-19 NOTE — Progress Notes (Signed)
Crystal Barnett  HEMATOLOGY ONCOLOGY PROGRESS NOTE  Date of service: .04/19/2015  Patient Care Team: Leighton Ruff, MD as PCP - General (Family Medicine)  Diagnosis:   #1 Thrombocytopenia - ?related to NASH with liver cirrhosis vs ITP #2 History of fatty liver now with concern for liver cirrhosis from [place] concerning liver lesion rule out it cc I like  Current Treatment: observation and further evaluation  INTERVAL HISTORY: Patient is here for follow-up regarding her thrombocytopenia. She had an ultrasound of the abdomen which shows concerns for cirrhotic changes in her liver possibly due to NASH. And splenomegaly. Does have a concerning 2 cm lesion in the left lobe of her liver. 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 No new issues with bleeding or bruising. No other acute new concerns.   REVIEW OF SYSTEMS:    10 Point review of systems of done and is negative except as noted above.  . Past Medical History  Diagnosis Date  . GOITER, MULTINODULAR 01/12/2008  . HYPOTHYROIDISM 09/07/2008  . ALLERGIC RHINITIS 01/12/2008  . Depression   . Hypertension   . Hyperlipidemia   . Thrombocytopenia ([place])     Appears chronic from at least 2014  . 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 Diabetes ([place])   . Fatty liver   . Vitamin D deficiency     . Past Surgical History  Procedure Laterality Date  . Cholecystectomy  1985  . Breast surgery  2008    Breast Biopsy   . Arthroscopic left knee  2005    . Social History  Substance Use Topics  . Smoking status: Former Smoker    Quit date: 05/13/1990  . Smokeless tobacco: None  . Alcohol Use: None    ALLERGIES:  has No Known Allergies.  MEDICATIONS:  Current Outpatient Prescriptions  Medication Sig Dispense Refill    . cholecalciferol (VITAMIN D) 1000 UNITS tablet Take 2,000 Units by mouth daily.    . fluticasone (FLONASE) 50 MCG/ACT nasal spray Place into both nostrils daily.    Crystal Barnett levothyroxine (SYNTHROID, LEVOTHROID) 50 MCG tablet Take 1 tablet (50 mcg total) by mouth daily. 90 tablet 1  . lisinopril-hydrochlorothiazide (PRINZIDE,ZESTORETIC) 10-12.5 MG tablet     . metFORMIN (GLUCOPHAGE) 500 MG tablet Take 1 tablet by mouth once daily     . montelukast (SINGULAIR) 10 MG tablet     . Multiple Vitamin (MULTIVITAMIN) tablet Take 1 tablet by mouth daily.    . raloxifene (EVISTA) 60 MG tablet Take 60 mg by mouth daily.     No current facility-administered medications for this visit.    PHYSICAL EXAMINATION: ECOG PERFORMANCE STATUS: 1 - Symptomatic but completely ambulatory  . Filed Vitals:   04/19/15 1017  BP: 160/77  Pulse: 80  Temp: 98.1 F (36.7 C)  Resp: 20    Filed Weights   04/19/15 1017  Weight: 241 lb 11.2 oz (109.634 kg)   .Body mass index is 41.47 kg/(m^2).  GENERAL:alert, in no acute distress and comfortable SKIN: skin color, texture, turgor are normal, no rashes or significant lesions EYES: normal, conjunctiva are pink and non-injected, sclera clear OROPHARYNX:no exudate, no erythema and lips, buccal mucosa, and tongue normal  NECK: supple, no JVD, thyroid normal size, non-tender, without nodularity LYMPH:  no  palpable lymphadenopathy in the cervical, axillary or inguinal LUNGS: clear to auscultation with normal respiratory effort HEART: regular rate & rhythm,  no murmurs and no lower extremity edema ABDOMEN: abdomen soft, non-tender, normoactive bowel sounds  Musculoskeletal: no cyanosis of digits and no clubbing  PSYCH: alert & oriented x 3 with fluent speech NEURO: no focal motor/sensory deficits  LABORATORY DATA:   I have reviewed the data as listed  . CBC Latest Ref Rng 04/19/2015 03/20/2015 01/30/2010  WBC 3.9 - 10.3 10e3/uL 3.9 4.3 4.7  Hemoglobin 11.6 - 15.9 g/dL  13.3 13.9 13.2  Hematocrit 34.8 - 46.6 % 38.8 40.2 38.1  Platelets 145 - 400 10e3/uL 87(L) 92 Large platelets present(L) 125(L)    . CMP Latest Ref Rng 04/19/2015 03/20/2015 01/30/2010  Glucose 70 - 140 mg/dl 143(H) 118 119(H)  BUN 7.0 - 26.0 mg/dL 10.9 13.2 8  Creatinine 0.6 - 1.1 mg/dL 0.7 0.7 0.64  Sodium 136 - 145 mEq/L 139 139 138  Potassium 3.5 - 5.1 mEq/L 4.1 3.9 4.2  Chloride 96 - 112 mEq/L - - 104  CO2 22 - 29 mEq/L 22 23 27   Calcium 8.4 - 10.4 mg/dL 9.2 9.3 9.0  Total Protein 6.4 - 8.3 g/dL 6.7 6.8 -  Total Bilirubin 0.20 - 1.20 mg/dL 0.68 0.69 -  Alkaline Phos 40 - 150 U/L 64 66 -  AST 5 - 34 U/L 50(H) 51(H) -  ALT 0 - 55 U/L 40 42 -   Component     Latest Ref Rng 03/20/2015  LDH     125 - 245 U/L 175  TSH     0.308 - 3.960 m(IU)/L 1.071  Sed Rate     0 - 30 mm/hr 4  Vitamin B-12     211 - 911 pg/mL 581    Peripheral blood smear personally reviewed by me:  Normocytic red cells with no increased schistocytes or microspherocytes. Slightly reduce platelets. No platelet clumping. Some large platelets. No increased blasts.    RADIOGRAPHIC STUDIES: I have personally reviewed the radiological images as listed and agreed with the findings in the report. US Abdomen Complete  03/28/2015  CLINICAL DATA:  Evaluate for splenomegaly, history of fatty liver and thrombocytopenia EXAM: ULTRASOUND ABDOMEN COMPLETE COMPARISON:  Ultrasound of the abdomen of 07/03/2007 FINDINGS: Gallbladder: The gallbladder has previously been resected. Common bile duct: Diameter: The common bile duct is normal measuring 4.9 mm in diameter. Liver: Degree of air is echogenic, but there is also some nodularity of the periphery of the liver suggesting changes of cirrhosis. There is and oval area of diminished echogenicity within the left lobe measuring 2.0 x 0.8 x 1.3 cm. In the setting of possible cirrhosis, hepatocellular carcinoma cannot be excluded and MR abdomen is recommended. IVC: No abnormality  visualized. Pancreas: The pancreas is moderately well seen with only the most distal tail obscured by bowel gas. Spleen: The spleen measures 8.4 cm, with some echogenic foci a most consistent with splenic calcified granulomas. No splenomegaly is seen. Right Kidney: Length: 12.4 cm.  No hydronephrosis is noted. Left Kidney: Length: 12.9 cm.  No hydronephrosis is seen. Abdominal aorta: The abdominal aorta is normal in caliber. Other findings: None. IMPRESSION: 1. Echogenic liver parenchyma with some nodularity of the margins of the liver suggesting possible cirrhosis. Correlate with liver function tests. 2. Oval region of diminished echogenicity within the left lobe of liver of 2.0 cm in diameter. Consider MRI of the liver as noted above. 3. Prior cholecystectomy. 4. Calcified  splenic granulomas.  No splenomegaly is seen. Electronically Signed   By: Ivar Drape M.D.   On: 03/28/2015 12:11    ASSESSMENT & PLAN:   #1 chronic thrombocytopenia. Platelet counts today are relatively stable 87k. Likely related to chronic liver disease/early cirrhosis related to NASH. Rule out an element of ITP. B12, TSH, LDH within normal limits. Ultrasound abdomen shows no evidence of splenomegaly. 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 Plan -We'll monitor the platelets with repeat CBC in a couple of months unless new symptoms arise. -AFP tumor Marker today -MRI of the liver with and without contrast to further evaluate the hepatic lesion to rule out hepatocellular carcinoma. -Patient has been referred to gastroenterology for further evaluation of the etiology of her liver cirrhosis and liver lesion and for continued management of these. -Patient was counseled on the importance of follow-up with her primary care physician to work on appropriate weight loss regimen with strict control of her dyslipidemia and diabetes. -Further management of her liver  lesion based on AFP tumor marker and MRI of the liver.  Return to care with Dr. Irene Limbo in 4 weeks. Earlier depending on results of the MRI and AFP tumor marker. Continue follow-up with primary care physician.  I spent 20 minutes counseling the patient face to face. The total time spent in the appointment was 25 minutes and more than 50% was on counseling and direct patient cares.    Sullivan Lone MD [place] AAHIVMS [facility] Hematology/Oncology Physician [facility]  (Office):       [phone] (Work cell):  [phone] (Fax):           [phone]

## 2015-04-20 ENCOUNTER — Ambulatory Visit: Payer: Medicare Other

## 2015-04-20 LAB — AFP TUMOR MARKER: AFP-Tumor Marker: 6.2 ng/mL — ABNORMAL HIGH (ref <6.1)

## 2015-04-21 ENCOUNTER — Ambulatory Visit (INDEPENDENT_AMBULATORY_CARE_PROVIDER_SITE_OTHER): Payer: Medicare Other | Admitting: Endocrinology

## 2015-04-21 ENCOUNTER — Encounter: Payer: Self-pay | Admitting: Endocrinology

## 2015-04-21 VITALS — BP 132/86 | HR 81 | Temp 98.3°F | Ht 64.0 in | Wt 239.0 lb

## 2015-04-21 DIAGNOSIS — E042 Nontoxic multinodular goiter: Secondary | ICD-10-CM

## 2015-04-21 MED ORDER — LEVOTHYROXINE SODIUM 50 MCG PO TABS: 50.0000 ug | ORAL_TABLET | Freq: Every day | ORAL | Status: DC

## 2015-04-21 NOTE — Patient Instructions (Addendum)
Let's recheck the ultrasound.  you will receive a phone call, about a day and time for an appointment. Please continue the same thyroid pill. Please return in 1 year.

## 2015-04-21 NOTE — Progress Notes (Signed)
   Subjective:    Patient ID: Crystal Barnett, female    DOB: [birth year], 74 y.o.   MRN: [id number]  HPI pt returns for f/u of multinodular goiter (dx'ed 2009, when bilat bxs were benign in 2009; the cytology did not show chronic thyroiditis, but she has chronic hypothyroidism; she has been on synthroid since 2009).  She notices the goiter, but says there has been no change.  She denies sob.  Past Medical History  Diagnosis Date  . GOITER, MULTINODULAR 01/12/2008  . HYPOTHYROIDISM 09/07/2008  . ALLERGIC RHINITIS 01/12/2008  . Depression   . Hypertension   . Hyperlipidemia   . Thrombocytopenia ([place])     Appears chronic from at least 2014  . 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 Diabetes ([place])   . Fatty liver   . Vitamin D deficiency     Past Surgical History  Procedure Laterality Date  . Cholecystectomy  1985  . Breast surgery  2008    Breast Biopsy   . Arthroscopic left knee  2005    Social History   Social History  . Marital Status: Single    Spouse Name: N/A  . Number of Children: N/A  . Years of Education: N/A   Occupational History  . 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 Smoking status: Former Smoker    Quit date: 05/13/1990  . Smokeless tobacco: Not on file  . Alcohol Use: Not on file  . Drug Use: Not on file  . Sexual Activity: Not on file   Other Topics Concern  . Not on file   Social History Narrative    Current Outpatient Prescriptions on File Prior to Visit  Medication Sig Dispense Refill  . cholecalciferol (VITAMIN D) 1000 UNITS tablet Take 2,000 Units by mouth daily.    . fluticasone (FLONASE) 50 MCG/ACT nasal spray Place into both nostrils daily.    Marland Kitchen lisinopril-hydrochlorothiazide (PRINZIDE,ZESTORETIC) 10-12.5 MG tablet     . metFORMIN (GLUCOPHAGE) 500 MG tablet Take 1 tablet by mouth once daily     . montelukast (SINGULAIR) 10 MG tablet      . Multiple Vitamin (MULTIVITAMIN) tablet Take 1 tablet by mouth daily.    . raloxifene (EVISTA) 60 MG tablet Take 60 mg by mouth daily.     No current facility-administered medications on file prior to visit.    No Known Allergies  Family History  Problem Relation Age of Onset  . Goiter Maternal Grandmother   . Stroke Father 80    BP 132/86 mmHg  Pulse 81  Temp(Src) 98.3 F (36.8 C) (Oral)  Ht 5' 4"  (1.626 m)  Wt 239 lb (108.41 kg)  BMI 41.00 kg/m2  SpO2 98%   Review of Systems No change in dry skin    Objective:   Physical Exam VITAL SIGNS:  See vs page GENERAL: no distress Neck: large bilat multinodular goiter (R>L), is again noted.     outside test results are reviewed: TSH=1.1    Assessment & Plan:  Hypothyroidism: well-replaced.  multinodular goiter, due to repeat US.   Patient is advised the following: Patient Instructions  Let's recheck the ultrasound.  you will receive a phone call, about a day and time for an appointment. Please continue the same thyroid pill. Please return in 1 year.

## 2015-05-04 ENCOUNTER — Ambulatory Visit (HOSPITAL_COMMUNITY)
Admission: RE | Admit: 2015-05-04 | Discharge: 2015-05-04 | Disposition: A | Discharge status: Home / Self Care | Payer: Medicare Other | Source: Ambulatory Visit | Attending: Hematology | Admitting: Hematology

## 2015-05-04 DIAGNOSIS — Z9049 Acquired absence of other specified parts of digestive tract: Secondary | ICD-10-CM | POA: Diagnosis not present

## 2015-05-04 DIAGNOSIS — K7460 Unspecified cirrhosis of liver: Secondary | ICD-10-CM | POA: Diagnosis not present

## 2015-05-04 DIAGNOSIS — D696 Thrombocytopenia, unspecified: Secondary | ICD-10-CM

## 2015-05-04 DIAGNOSIS — K769 Liver disease, unspecified: Secondary | ICD-10-CM | POA: Diagnosis not present

## 2015-05-04 DIAGNOSIS — R161 Splenomegaly, not elsewhere classified: Secondary | ICD-10-CM | POA: Insufficient documentation

## 2015-05-04 DIAGNOSIS — K7581 Nonalcoholic steatohepatitis (NASH): Secondary | ICD-10-CM

## 2015-05-04 DIAGNOSIS — C228 Malignant neoplasm of liver, primary, unspecified as to type: Secondary | ICD-10-CM

## 2015-05-04 MED ORDER — GADOXETATE DISODIUM 0.25 MMOL/ML IV SOLN
5.0000 mL | Freq: Once | INTRAVENOUS | Status: AC | PRN
  Administered 2015-05-04: 10 mL via INTRAVENOUS

## 2015-05-05 ENCOUNTER — Ambulatory Visit
Admission: RE | Admit: 2015-05-05 | Discharge: 2015-05-05 | Disposition: A | Discharge status: Home / Self Care | Payer: Medicare Other | Source: Ambulatory Visit | Attending: Endocrinology | Admitting: Endocrinology

## 2015-05-05 ENCOUNTER — Telehealth: Payer: Self-pay | Admitting: Endocrinology

## 2015-05-05 DIAGNOSIS — E042 Nontoxic multinodular goiter: Secondary | ICD-10-CM

## 2015-05-05 NOTE — Telephone Encounter (Signed)
Spoke with pt per Dr. Loanne Drilling, imaging appeared no changed, everything is good see you next.

## 2015-05-14 HISTORY — PX: BREAST BIOPSY: SHX20

## 2015-05-19 ENCOUNTER — Telehealth: Payer: Self-pay | Admitting: Hematology

## 2015-05-19 ENCOUNTER — Ambulatory Visit (HOSPITAL_BASED_OUTPATIENT_CLINIC_OR_DEPARTMENT_OTHER): Payer: Medicare Other | Admitting: Hematology

## 2015-05-19 ENCOUNTER — Encounter: Payer: Self-pay | Admitting: Hematology

## 2015-05-19 ENCOUNTER — Ambulatory Visit (HOSPITAL_BASED_OUTPATIENT_CLINIC_OR_DEPARTMENT_OTHER): Payer: Medicare Other

## 2015-05-19 VITALS — BP 153/53 | HR 84 | Temp 97.8°F | Resp 20 | Wt 242.3 lb

## 2015-05-19 DIAGNOSIS — E785 Hyperlipidemia, unspecified: Secondary | ICD-10-CM

## 2015-05-19 DIAGNOSIS — K769 Liver disease, unspecified: Secondary | ICD-10-CM | POA: Diagnosis not present

## 2015-05-19 DIAGNOSIS — E119 Type 2 diabetes mellitus without complications: Secondary | ICD-10-CM

## 2015-05-19 DIAGNOSIS — D696 Thrombocytopenia, unspecified: Secondary | ICD-10-CM | POA: Diagnosis not present

## 2015-05-19 DIAGNOSIS — K7460 Unspecified cirrhosis of liver: Secondary | ICD-10-CM | POA: Diagnosis not present

## 2015-05-19 DIAGNOSIS — K7581 Nonalcoholic steatohepatitis (NASH): Secondary | ICD-10-CM

## 2015-05-19 DIAGNOSIS — R161 Splenomegaly, not elsewhere classified: Secondary | ICD-10-CM | POA: Diagnosis not present

## 2015-05-19 LAB — COMPREHENSIVE METABOLIC PANEL
ALBUMIN: 3.6 g/dL (ref 3.5–5.0)
ALK PHOS: 66 U/L (ref 40–150)
ALT: 39 U/L (ref 0–55)
AST: 53 U/L — AB (ref 5–34)
Anion Gap: 8 mEq/L (ref 3–11)
BUN: 11.7 mg/dL (ref 7.0–26.0)
CALCIUM: 9.1 mg/dL (ref 8.4–10.4)
CO2: 23 mEq/L (ref 22–29)
CREATININE: 0.7 mg/dL (ref 0.6–1.1)
Chloride: 106 mEq/L (ref 98–109)
EGFR: 82 mL/min/{1.73_m2} — ABNORMAL LOW (ref >90)
Glucose: 156 mg/dl — ABNORMAL HIGH (ref 70–140)
Potassium: 3.9 mEq/L (ref 3.5–5.1)
Sodium: 136 mEq/L (ref 136–145)
Total Bilirubin: 0.71 mg/dL (ref 0.20–1.20)
Total Protein: 6.8 g/dL (ref 6.4–8.3)

## 2015-05-19 LAB — CBC & DIFF AND RETIC
BASO%: 0.2 % (ref 0.0–2.0)
BASOS ABS: 0 10*3/uL (ref 0.0–0.1)
EOS ABS: 0.1 10*3/uL (ref 0.0–0.5)
EOS%: 2.3 % (ref 0.0–7.0)
HEMATOCRIT: 39.2 % (ref 34.8–46.6)
HEMOGLOBIN: 13.5 g/dL (ref 11.6–15.9)
IMMATURE RETIC FRACT: 8.9 % (ref 1.60–10.00)
LYMPH#: 1.5 10*3/uL (ref 0.9–3.3)
LYMPH%: 35.5 % (ref 14.0–49.7)
MCH: 31 pg (ref 25.1–34.0)
MCHC: 34.4 g/dL (ref 31.5–36.0)
MCV: 90.1 fL (ref 79.5–101.0)
MONO#: 0.4 10*3/uL (ref 0.1–0.9)
MONO%: 8.4 % (ref 0.0–14.0)
NEUT#: 2.3 10*3/uL (ref 1.5–6.5)
NEUT%: 53.6 % (ref 38.4–76.8)
Platelets: 92 10*3/uL — ABNORMAL LOW (ref 145–400)
RBC: 4.35 10*6/uL (ref 3.70–5.45)
RDW: 13.4 % (ref 11.2–14.5)
RETIC %: 2.39 % — AB (ref 0.70–2.10)
RETIC CT ABS: 103.97 10*3/uL — AB (ref 33.70–90.70)
WBC: 4.3 10*3/uL (ref 3.9–10.3)

## 2015-05-19 LAB — FERRITIN: Ferritin: 113 ng/ml (ref 9–269)

## 2015-05-19 NOTE — Telephone Encounter (Signed)
Patient sent back to lab and given avs report and appointments for April. Patient also given appointment at [facility] GI 1/27 @ 8:15 am. [facility] will call re mri - patient aware.

## 2015-05-20 LAB — HEPATITIS B SURFACE ANTIBODY,QUALITATIVE

## 2015-05-20 LAB — HEPATITIS C ANTIBODY: HCV Ab: NEGATIVE

## 2015-05-20 LAB — HEPATITIS B CORE ANTIBODY, TOTAL: Hep B Core Total Ab: NONREACTIVE

## 2015-05-20 LAB — HEPATITIS B SURFACE ANTIGEN: HEP B S AG: NEGATIVE

## 2015-05-23 ENCOUNTER — Ambulatory Visit: Payer: Medicare Other

## 2015-06-08 NOTE — Progress Notes (Signed)
Crystal Barnett  HEMATOLOGY ONCOLOGY PROGRESS NOTE  Date of service:  05/19/2015  Patient Care Team: Leighton Ruff, MD as PCP - General (Family Medicine)  Diagnosis:   #1 Thrombocytopenia - ?related to NASH with liver cirrhosis vs ITP #2 History of fatty liver now with concern for liver cirrhosis from [place] concerning liver lesion rule out it cc I like #3 Liver nodule ?dysplatic nodule vs cirrhotic nodule vs early [facility]   Current Treatment: observation and further evaluation  INTERVAL HISTORY: Patient is here for follow-up regarding her thrombocytopenia and her MRI of the liver. Her platelet counts are stable at 92k and she has no issues with easy bruising or bleeding. MRI of the liver showed a 1.2cm hypervascular lesion in the dome of the rt hepatic lobe in the setting of a cirrhotic appearing liver and mild splenomegaly. Patient has no abdominal pain. AFP tumor marker done previously near normal at 6.2. No acute acute new symptoms. Patient notes that she previously received only 1 dose of Hep B vaccine and did no complete the course. Of vaccination.  REVIEW OF SYSTEMS:    10 Point review of systems of done and is negative except as noted above.  . Past Medical History  Diagnosis Date  . GOITER, MULTINODULAR 01/12/2008  . HYPOTHYROIDISM 09/07/2008  . ALLERGIC RHINITIS 01/12/2008  . Depression   . Hypertension   . Hyperlipidemia   . Thrombocytopenia ([place])     Appears chronic from at least 2014  . 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 Diabetes ([place])   . Fatty liver   . Vitamin D deficiency     . Past Surgical History  Procedure Laterality Date  . Cholecystectomy  1985  . Breast surgery  2008    Breast Biopsy   . Arthroscopic left knee  2005    . Social History  Substance Use Topics  . Smoking status: Former Smoker    Quit date: 05/13/1990  . Smokeless tobacco: None  . Alcohol Use: None    ALLERGIES:  has No Known  Allergies.  MEDICATIONS:  Current Outpatient Prescriptions  Medication Sig Dispense Refill  . cholecalciferol (VITAMIN D) 1000 UNITS tablet Take 2,000 Units by mouth daily.    . fluticasone (FLONASE) 50 MCG/ACT nasal spray Place into both nostrils daily.    Crystal Barnett levothyroxine (SYNTHROID, LEVOTHROID) 50 MCG tablet Take 1 tablet (50 mcg total) by mouth daily. 90 tablet 3  . lisinopril-hydrochlorothiazide (PRINZIDE,ZESTORETIC) 10-12.5 MG tablet     . metFORMIN (GLUCOPHAGE) 500 MG tablet Take 1 tablet by mouth once daily     . montelukast (SINGULAIR) 10 MG tablet     . Multiple Vitamin (MULTIVITAMIN) tablet Take 1 tablet by mouth daily.    . raloxifene (EVISTA) 60 MG tablet Take 60 mg by mouth daily.     No current facility-administered medications for this visit.    PHYSICAL EXAMINATION: ECOG PERFORMANCE STATUS: 1 - Symptomatic but completely ambulatory  . Filed Vitals:   05/19/15 1010  BP: 153/53  Pulse: 84  Temp: 97.8 F (36.6 C)  Resp: 20    Filed Weights   05/19/15 1010  Weight: 242 lb 4.8 oz (109.907 kg)   .Body mass index is 41.57 kg/(m^2).  GENERAL:alert, in no acute distress and comfortable SKIN: skin color, texture, turgor are normal, no rashes or significant lesions EYES: normal, conjunctiva are pink and non-injected, sclera clear OROPHARYNX:no exudate, no erythema and lips,  buccal mucosa, and tongue normal  NECK: supple, no JVD, thyroid normal size, non-tender, without nodularity LYMPH:  no palpable lymphadenopathy in the cervical, axillary or inguinal LUNGS: clear to auscultation with normal respiratory effort HEART: regular rate & rhythm,  no murmurs and no lower extremity edema ABDOMEN: abdomen soft, non-tender, normoactive bowel sounds  Musculoskeletal: no cyanosis of digits and no clubbing  PSYCH: alert & oriented x 3 with fluent speech NEURO: no focal motor/sensory deficits  LABORATORY DATA:   I have reviewed the data as listed  . CBC Latest Ref Rng  05/19/2015 04/19/2015 03/20/2015  WBC 3.9 - 10.3 10e3/uL 4.3 3.9 4.3  Hemoglobin 11.6 - 15.9 g/dL 13.5 13.3 13.9  Hematocrit 34.8 - 46.6 % 39.2 38.8 40.2  Platelets 145 - 400 10e3/uL 92(L) 87(L) 92 Large platelets present(L)    . CMP Latest Ref Rng 05/19/2015 04/19/2015 03/20/2015  Glucose 70 - 140 mg/dl 156(H) 143(H) 118  BUN 7.0 - 26.0 mg/dL 11.7 10.9 13.2  Creatinine 0.6 - 1.1 mg/dL 0.7 0.7 0.7  Sodium 136 - 145 mEq/L 136 139 139  Potassium 3.5 - 5.1 mEq/L 3.9 4.1 3.9  Chloride 96 - 112 mEq/L - - -  CO2 22 - 29 mEq/L 23 22 23   Calcium 8.4 - 10.4 mg/dL 9.1 9.2 9.3  Total Protein 6.4 - 8.3 g/dL 6.8 6.7 6.8  Total Bilirubin 0.20 - 1.20 mg/dL 0.71 0.68 0.69  Alkaline Phos 40 - 150 U/L 66 64 66  AST 5 - 34 U/L 53(H) 50(H) 51(H)  ALT 0 - 55 U/L 39 40 42   Component     Latest Ref Rng 03/20/2015  LDH     125 - 245 U/L 175  TSH     0.308 - 3.960 m(IU)/L 1.071  Sed Rate     0 - 30 mm/hr 4  Vitamin B-12     211 - 911 pg/mL 581    Component     Latest Ref Rng 04/19/2015 05/19/2015  AFP Tumor Marker     <6.1 ng/mL 6.2 (H)   HCV Ab     NEGATIVE  NEGATIVE  Hep B S Ab     NEGATIVE  INDETER (A)  Hepatitis B Surface Ag     NEGATIVE  NEGATIVE  Hep B Core Total Ab     NON REACTIVE  NON REACTIVE  Ferritin     9 - 269 ng/ml  113   Peripheral blood smear personally reviewed by me:  Normocytic red cells with no increased schistocytes or microspherocytes. Slightly reduce platelets. No platelet clumping. Some large platelets. No increased blasts.    RADIOGRAPHIC STUDIES: I have personally reviewed the radiological images as listed and agreed with the findings in the report.  MRI liver 05/04/2015: FINDINGS: Lower chest: No acute findings.  Hepatobiliary: Hepatic cirrhosis is demonstrated. A 1.2 cm hypervascular lesion is seen in the posterior dome of the right hepatic lobe, image 24/series 601. No definitive portal venous phase washout or peripheral rim enhancement is seen, but this  lesion does show washout on 20 minute delayed phase, image 24 series 13. Differential diagnosis includes small hepatocellular carcinoma and dysplastic nodule. No other hepatic lesions are seen.  Prior cholecystectomy noted. No evidence of biliary ductal dilatation.  Pancreas: No mass, inflammatory changes, or other parenchymal abnormality identified.  Spleen: Mild splenomegaly.  Adrenals/Urinary Tract: No masses identified. No evidence of hydronephrosis.  Stomach/Bowel: Visualized portions within the abdomen are unremarkable.  Vascular/Lymphatic: No pathologically enlarged lymph nodes identified. No  abdominal aortic aneurysm demonstrated.  Other: Minimal ascites noted within the pelvis.  Musculoskeletal: No suspicious bone lesions identified.  IMPRESSION: 1.2 cm hypervascular lesion in dome of the right hepatic lobe. Differential diagnosis includes small hepatocellular carcinoma and dysplastic nodule. Due to small size and location of this lesion, recommend continued followup by MRI in 6 months.  Hepatic cirrhosis and mild splenomegaly.   Electronically Signed  By: Earle Gell M.D.  On: 05/04/2015 10:53  [place] abd 03/28/2015: ULTRASOUND ABDOMEN COMPLETE  COMPARISON: Ultrasound of the abdomen of 07/03/2007  FINDINGS: Gallbladder: The gallbladder has previously been resected.  Common bile duct: Diameter: The common bile duct is normal measuring 4.9 mm in diameter.  Liver: Degree of air is echogenic, but there is also some nodularity of the periphery of the liver suggesting changes of cirrhosis. There is and oval area of diminished echogenicity within the left lobe measuring 2.0 x 0.8 x 1.3 cm. In the setting of possible cirrhosis, hepatocellular carcinoma cannot be excluded and MR abdomen is recommended.  IVC: No abnormality visualized.  Pancreas: The pancreas is moderately well seen with only the most distal tail obscured by bowel  gas.  Spleen: The spleen measures 8.4 cm, with some echogenic foci a most consistent with splenic calcified granulomas. No splenomegaly is seen.  Right Kidney: Length: 12.4 cm. No hydronephrosis is noted.  Left Kidney: Length: 12.9 cm. No hydronephrosis is seen.  Abdominal aorta: The abdominal aorta is normal in caliber.  Other findings: None.  IMPRESSION: 1. Echogenic liver parenchyma with some nodularity of the margins of the liver suggesting possible cirrhosis. Correlate with liver function tests. 2. Oval region of diminished echogenicity within the left lobe of liver of 2.0 cm in diameter. Consider MRI of the liver as noted above. 3. Prior cholecystectomy. 4. Calcified splenic granulomas. No splenomegaly is seen.   Electronically Signed  By: Ivar Drape M.D.   ASSESSMENT & PLAN:   #1 chronic thrombocytopenia. Platelet counts today are relatively stable 927k. 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 B12, TSH, LDH within normal limits. Ultrasound abdomen shows no evidence of splenomegaly. MRI abd shows mild splenomegaly. 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 #3 left hepatic 2 cm liver lesion rule out. MRI liver did not indentify this lesion but noted a hypervascular 1.2cm lesion in the rt hepatic dome without clear radiographic features of overt HCC ?dysplastic nodule AFP 6.2 Plan -GI referral given for further evaluation of the etiology of her liver cirrhosis and liver lesion and for continued management of these. -consider Hep B vaccination given non immune status -given the small size and location of the 1.2 liver nodule we will avoid biopsy at this time and has a close interval f/u rpt MRI liver in 3 months and rpt AFP tumor marker as well. If changes in size would need further evaluation/management. -liver cirrhosis mx per GI and PCP -Patient was  counseled on the importance of follow-up with her primary care physician to work on appropriate weight loss regimen with strict control of her dyslipidemia and diabetes.  Return to care with Dr. Irene Limbo in 3 months . Earlier if any new concerns. Continue follow-up with primary care physician.  I spent 20 minutes counseling the patient face to face. The total time spent in the appointment was 25 minutes and more than 50% was on counseling and direct patient cares.    Sullivan Lone MD [place] AAHIVMS [facility] Hematology/Oncology Physician [facility]  (Office):       [phone] (Work cell):  [phone] (Fax):           [phone]

## 2015-06-09 ENCOUNTER — Encounter: Payer: Self-pay | Admitting: Physician Assistant

## 2015-06-09 ENCOUNTER — Other Ambulatory Visit (INDEPENDENT_AMBULATORY_CARE_PROVIDER_SITE_OTHER): Payer: Medicare Other

## 2015-06-09 ENCOUNTER — Ambulatory Visit (INDEPENDENT_AMBULATORY_CARE_PROVIDER_SITE_OTHER): Payer: Medicare Other | Admitting: Physician Assistant

## 2015-06-09 VITALS — BP 127/70 | HR 68 | Ht 64.0 in | Wt 235.2 lb

## 2015-06-09 DIAGNOSIS — R932 Abnormal findings on diagnostic imaging of liver and biliary tract: Secondary | ICD-10-CM

## 2015-06-09 DIAGNOSIS — R7989 Other specified abnormal findings of blood chemistry: Secondary | ICD-10-CM | POA: Diagnosis not present

## 2015-06-09 DIAGNOSIS — R945 Abnormal results of liver function studies: Secondary | ICD-10-CM

## 2015-06-09 DIAGNOSIS — K760 Fatty (change of) liver, not elsewhere classified: Secondary | ICD-10-CM

## 2015-06-09 DIAGNOSIS — R799 Abnormal finding of blood chemistry, unspecified: Secondary | ICD-10-CM | POA: Diagnosis not present

## 2015-06-09 LAB — COMPREHENSIVE METABOLIC PANEL
ALK PHOS: 56 U/L (ref 39–117)
ALT: 32 U/L (ref 0–35)
AST: 39 U/L — AB (ref 0–37)
Albumin: 3.8 g/dL (ref 3.5–5.2)
BUN: 9 mg/dL (ref 6–23)
CO2: 30 mEq/L (ref 19–32)
CREATININE: 0.63 mg/dL (ref 0.40–1.20)
Calcium: 9.4 mg/dL (ref 8.4–10.5)
Chloride: 105 mEq/L (ref 96–112)
GFR: 97.85 mL/min (ref >60.00)
GLUCOSE: 125 mg/dL — AB (ref 70–99)
POTASSIUM: 3.9 meq/L (ref 3.5–5.1)
Sodium: 141 mEq/L (ref 135–145)
TOTAL PROTEIN: 6.5 g/dL (ref 6.0–8.3)
Total Bilirubin: 0.5 mg/dL (ref 0.2–1.2)

## 2015-06-09 LAB — CBC WITH DIFFERENTIAL/PLATELET
BASOS ABS: 0 10*3/uL (ref 0.0–0.1)
Basophils Relative: 0.5 % (ref 0.0–3.0)
EOS ABS: 0.1 10*3/uL (ref 0.0–0.7)
Eosinophils Relative: 2.5 % (ref 0.0–5.0)
HEMATOCRIT: 40.6 % (ref 36.0–46.0)
HEMOGLOBIN: 13.8 g/dL (ref 12.0–15.0)
LYMPHS PCT: 36.6 % (ref 12.0–46.0)
Lymphs Abs: 1.6 10*3/uL (ref 0.7–4.0)
MCHC: 33.9 g/dL (ref 30.0–36.0)
MCV: 92.4 fl (ref 78.0–100.0)
MONOS PCT: 7.5 % (ref 3.0–12.0)
Monocytes Absolute: 0.3 10*3/uL (ref 0.1–1.0)
NEUTROS ABS: 2.3 10*3/uL (ref 1.4–7.7)
Neutrophils Relative %: 52.9 % (ref 43.0–77.0)
PLATELETS: 101 10*3/uL — AB (ref 150.0–400.0)
RBC: 4.4 Mil/uL (ref 3.87–5.11)
RDW: 13.8 % (ref 11.5–15.5)
WBC: 4.3 10*3/uL (ref 4.0–10.5)

## 2015-06-09 LAB — IBC PANEL
IRON: 84 ug/dL (ref 42–145)
Saturation Ratios: 19 % — ABNORMAL LOW (ref 20.0–50.0)
TRANSFERRIN: 316 mg/dL (ref 212.0–360.0)

## 2015-06-09 LAB — IGA: IgA: 196 mg/dL (ref 68–378)

## 2015-06-09 LAB — LIPASE: LIPASE: 36 U/L (ref 11.0–59.0)

## 2015-06-09 LAB — GAMMA GT: GGT: 56 U/L — ABNORMAL HIGH (ref 7–51)

## 2015-06-09 LAB — C-REACTIVE PROTEIN: CRP: 0.3 mg/dL — AB (ref 0.5–20.0)

## 2015-06-09 LAB — AMMONIA: AMMONIA: 49 umol/L — AB (ref 11–35)

## 2015-06-09 NOTE — Progress Notes (Signed)
Assessment and plan reviewed. 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 Indeterminate liver lesion being followed by MRI. Apparently has seen needle GI. Likely need screening EGD, possibly colonoscopy. Await supplemental testing. She needs follow-up as suggested

## 2015-06-09 NOTE — Progress Notes (Signed)
Patient ID: Crystal Barnett, female   DOB: [birth year], 75 y.o.   MRN: [id number]    HPI:  Crystal Barnett is a 75 y.o.   female referred by Brunetta Genera, MD for evaluation of an abnormal liver on imaging. 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 Review of her chart shows that she has been noted to have a low platelet count since June 2014. Through this time her platelets ran between 100 K and 127K. She was evaluated by her primary care provider in September 2016 and it was noted that her platelet count had dropped down to 82K. white count was also noted to be low at 3.4. Hemoglobin was 13.1. She was referred to hematology for further evaluation of her thrombocytopenia. She denies bright red blood per rectum or melena. She denies any unusual bruising, bleeding, epistaxis, etc. States she was diagnosed with fatty liver in the late 1990s but has never had any recommendations given to her regarding this. Due to her history of fatty liver, she was sent for an ultrasound which she had on 03/28/2015. It showed echogenic liver parenchyma with some nodularity of the margins of the liver suggesting possible cirrhosis. Oval region of diminished echogenicity within the left lobe of the liver of 2.0 centimeter in diameter. Consider MRI of the liver. She then had an MRI of the liver on 05/04/2015 that showed a 1.2 cm hypervascular lesion in the dome of the right hepatic lobe. Differential diagnosis includes small hepatocellular carcinoma and dysplastic nodule. Due to small size and location of this lesion, recommend continued follow-up by MRI in 6 months. Hepatic cirrhosis and mild splenomegaly noted. AFP on 04/19/2015 was 6.2. The patient was advised to follow-up with her primary care provider for control of her diabetes and dyslipidemia as well as a weight loss regimen. She was advised to follow with gastroenterology for further evaluation of her cirrhosis and  liver lesion. Patient denies abdominal pain and states she has been feeling fine. She does report that she had a colonoscopy at [facility] gastroenterology 9 or 10 years ago. She states she was advised to have a repeat colonoscopy in 10 years.   Past Medical History  Diagnosis Date  . GOITER, MULTINODULAR 01/12/2008  . HYPOTHYROIDISM 09/07/2008  . ALLERGIC RHINITIS 01/12/2008  . Depression   . Hypertension   . Hyperlipidemia   . Thrombocytopenia ([place])     Appears chronic from at least 2014  . 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 Diabetes ([place])   . Fatty liver   . Vitamin D deficiency     Past Surgical History  Procedure Laterality Date  . Cholecystectomy  1985  . Breast surgery  2008    Breast Biopsy   . Arthroscopic left knee  2005   Family History  Problem Relation Age of Onset  . Goiter Maternal Grandmother   . Stroke Father 72   Social History  Substance Use Topics  . Smoking status: Former Smoker    Quit date: 05/13/1990  . Smokeless tobacco: None  . Alcohol Use: None   Current Outpatient Prescriptions  Medication Sig Dispense Refill  . cholecalciferol (VITAMIN D) 1000 UNITS tablet Take 2,000 Units by mouth daily.    . fluticasone (FLONASE) 50 MCG/ACT nasal spray Place into both nostrils daily.    Marland Kitchen levothyroxine (SYNTHROID, LEVOTHROID) 50 MCG tablet Take 1 tablet (  50 mcg total) by mouth daily. 90 tablet 3  . lisinopril-hydrochlorothiazide (PRINZIDE,ZESTORETIC) 10-12.5 MG tablet     . metFORMIN (GLUCOPHAGE) 500 MG tablet Take 1 tablet by mouth once daily     . montelukast (SINGULAIR) 10 MG tablet     . Multiple Vitamin (MULTIVITAMIN) tablet Take 1 tablet by mouth daily.    . raloxifene (EVISTA) 60 MG tablet Take 60 mg by mouth daily.     No current facility-administered medications for this visit.   No Known Allergies   Review of Systems: Per history of present illness, otherwise  negative.  Studies:  EXAM: ULTRASOUND ABDOMEN COMPLETE  COMPARISON: Ultrasound of the abdomen of 07/03/2007  FINDINGS: Gallbladder: The gallbladder has previously been resected.  Common bile duct: Diameter: The common bile duct is normal measuring 4.9 mm in diameter.  Liver: Degree of air is echogenic, but there is also some nodularity of the periphery of the liver suggesting changes of cirrhosis. There is and oval area of diminished echogenicity within the left lobe measuring 2.0 x 0.8 x 1.3 cm. In the setting of possible cirrhosis, hepatocellular carcinoma cannot be excluded and MR abdomen is recommended.  IVC: No abnormality visualized.  Pancreas: The pancreas is moderately well seen with only the most distal tail obscured by bowel gas.  Spleen: The spleen measures 8.4 cm, with some echogenic foci a most consistent with splenic calcified granulomas. No splenomegaly is seen.  Right Kidney: Length: 12.4 cm. No hydronephrosis is noted.  Left Kidney: Length: 12.9 cm. No hydronephrosis is seen.  Abdominal aorta: The abdominal aorta is normal in caliber.  Other findings: None.  IMPRESSION: 1. Echogenic liver parenchyma with some nodularity of the margins of the liver suggesting possible cirrhosis. Correlate with liver function tests. 2. Oval region of diminished echogenicity within the left lobe of liver of 2.0 cm in diameter. Consider MRI of the liver as noted above. 3. Prior cholecystectomy. 4. Calcified splenic granulomas. No splenomegaly is seen.   Electronically Signed  By: Ivar Drape M.D.  On: 03/28/2015 12:11 Contrast   Status: Final result       PACS Images     Show images for MR Liver W Wo Contrast     Study Result     CLINICAL DATA: Hepatic cirrhosis. Nonalcoholic steatohepatitis. Left hepatic lobe lesion seen on recent ultrasound.  EXAM: MRI ABDOMEN WITHOUT AND WITH CONTRAST  TECHNIQUE: Multiplanar  multisequence MR imaging of the abdomen was performed both before and after the administration of intravenous contrast.  CONTRAST: 10 mL Eovist  COMPARISON: Ultrasound 03/28/2015  FINDINGS: Lower chest: No acute findings.  Hepatobiliary: Hepatic cirrhosis is demonstrated. A 1.2 cm hypervascular lesion is seen in the posterior dome of the right hepatic lobe, image 24/series 601. No definitive portal venous phase washout or peripheral rim enhancement is seen, but this lesion does show washout on 20 minute delayed phase, image 24 series 13. Differential diagnosis includes small hepatocellular carcinoma and dysplastic nodule. No other hepatic lesions are seen.  Prior cholecystectomy noted. No evidence of biliary ductal dilatation.  Pancreas: No mass, inflammatory changes, or other parenchymal abnormality identified.  Spleen: Mild splenomegaly.  Adrenals/Urinary Tract: No masses identified. No evidence of hydronephrosis.  Stomach/Bowel: Visualized portions within the abdomen are unremarkable.  Vascular/Lymphatic: No pathologically enlarged lymph nodes identified. No abdominal aortic aneurysm demonstrated.  Other: Minimal ascites noted within the pelvis.  Musculoskeletal: No suspicious bone lesions identified.  IMPRESSION: 1.2 cm hypervascular lesion in dome of the right hepatic lobe. Differential diagnosis  includes small hepatocellular carcinoma and dysplastic nodule. Due to small size and location of this lesion, recommend continued followup by MRI in 6 months.  Hepatic cirrhosis and mild splenomegaly.   Electronically Signed  By: Earle Gell M.D.  On: 05/04/2015 10:53    LAB RESULTS: Comprehensive metabolic panel 65/46/5035 total bili 0.71, alkaline phosphatase 66, AST 53, ALT 39. Hepatitis C antibody negative Hepatitis B surface antibody indeterminate (patient states she had 1 of the 3 hepatitis B series several years ago but never  completed the series) Hepatitis B surface antigen negative Hepatitis B core total antibody nonreactive Ferritin 113   Physical Exam: BP 127/70 mmHg  Pulse 68  Ht 5' 4"  (1.626 m)  Wt 235 lb 3.2 oz (106.686 kg)  BMI 40.35 kg/m2 Constitutional: Pleasant,well-developed,  Caucasian female in no acute distress. HEENT: Normocephalic and atraumatic. Conjunctivae are normal. No scleral icterus. Neck supple.  Cardiovascular: Normal rate, regular rhythm.  Pulmonary/chest: Effort normal and breath sounds normal. No wheezing, rales or rhonchi. Abdominal: Soft, nondistended, nontender. Bowel sounds active throughout. There are no masses palpable. No hepatomegaly. Extremities: no edema Lymphadenopathy: No cervical adenopathy noted. Neurological: Alert and oriented to person place and time. Skin: Skin is warm and dry. No rashes noted. Psychiatric: Normal mood and affect. Behavior is normal.  ASSESSMENT AND PLAN: 75 year old female with a long-standing history of fatty liver, found to have thrombocytopenia referred for evaluation. Her thrombocytopenia is likely due to her chronic liver disease/fatty liver/[place]. Ultrasound without evidence of splenomegaly but did show a left hepatic 2 cm liver lesion. AFP normal. Subsequent MRI showed cirrhosis with mild splenomegaly. Cirrhosis may be due to [place], but additional blood work to include iron panel, IgA, TTG, alpha-1 antitrypsin, mitochondrial antibody, antinuclear antibody, anti-smooth muscle antibody, ceruloplasmin, and hepatitis A total antibody will be obtained along with a GGT to evaluate for possible other etiologies. She will follow-up several weeks later to review results. She has also signed a medical release to get a copy of her colonoscopy report from [place]. Further recommendations as to whether she will need an EGD to screen for varices or a surveillance colonoscopy will be made pending the findings of the above. She has been scheduled for a repeat  MRI to be done on April 6 by hematology.    Billy Turvey, Vita Barley PA-C 06/09/2015, 12:12 PM  CC: Brunetta Genera, MD

## 2015-06-09 NOTE — Patient Instructions (Signed)
Your physician has requested that you go to the basement for lab work before leaving today.  Please keep our follow up appointment with Dr. Henrene Pastor.

## 2015-06-10 LAB — HEPATITIS A ANTIBODY, TOTAL: Hep A Total Ab: NONREACTIVE

## 2015-06-12 LAB — TISSUE TRANSGLUTAMINASE, IGA: Tissue Transglutaminase Ab, IgA: 1 U/mL (ref <4)

## 2015-06-12 LAB — CERULOPLASMIN: CERULOPLASMIN: 29 mg/dL (ref 18–53)

## 2015-06-12 LAB — MITOCHONDRIAL ANTIBODIES: Mitochondrial M2 Ab, IgG: <=20 Units (ref <=20.0)

## 2015-06-12 LAB — ALPHA-1-ANTITRYPSIN: A1 ANTITRYPSIN SER: 145 mg/dL (ref 83–199)

## 2015-06-13 LAB — ANTI-NUCLEAR AB-TITER (ANA TITER)

## 2015-06-13 LAB — ANTI-SMOOTH MUSCLE ANTIBODY, IGG

## 2015-06-13 LAB — ANA: Anti Nuclear Antibody(ANA): POSITIVE — AB

## 2015-06-15 ENCOUNTER — Other Ambulatory Visit: Payer: Self-pay | Admitting: *Deleted

## 2015-06-15 DIAGNOSIS — K760 Fatty (change of) liver, not elsewhere classified: Secondary | ICD-10-CM

## 2015-06-15 MED ORDER — LACTULOSE 10 GM/15ML PO SOLN: ORAL | Status: DC

## 2015-06-16 ENCOUNTER — Telehealth: Payer: Self-pay | Admitting: Physician Assistant

## 2015-06-16 NOTE — Telephone Encounter (Signed)
Spoke with patient and clarified her instructions for Lactulose. Answered her questions about ammonia level.

## 2015-06-19 DIAGNOSIS — N909 Noninflammatory disorder of vulva and perineum, unspecified: Secondary | ICD-10-CM | POA: Diagnosis not present

## 2015-06-19 DIAGNOSIS — N9089 Other specified noninflammatory disorders of vulva and perineum: Secondary | ICD-10-CM | POA: Insufficient documentation

## 2015-06-22 ENCOUNTER — Other Ambulatory Visit (INDEPENDENT_AMBULATORY_CARE_PROVIDER_SITE_OTHER): Payer: Medicare Other

## 2015-06-22 DIAGNOSIS — K760 Fatty (change of) liver, not elsewhere classified: Secondary | ICD-10-CM | POA: Diagnosis not present

## 2015-06-22 LAB — AMMONIA: Ammonia: 25 umol/L (ref 11–35)

## 2015-06-27 ENCOUNTER — Telehealth: Payer: Self-pay | Admitting: Physician Assistant

## 2015-06-27 NOTE — Telephone Encounter (Signed)
Patient is calling to be sure Lori Hvozdovic, PA-C did not want her on a special diet. She follows a diabetic diet per her PCP. No mention of diet changes in OV note. Patient aware.

## 2015-06-29 DIAGNOSIS — Z7984 Long term (current) use of oral hypoglycemic drugs: Secondary | ICD-10-CM | POA: Diagnosis not present

## 2015-06-29 DIAGNOSIS — K7581 Nonalcoholic steatohepatitis (NASH): Secondary | ICD-10-CM | POA: Diagnosis not present

## 2015-06-29 DIAGNOSIS — E119 Type 2 diabetes mellitus without complications: Secondary | ICD-10-CM | POA: Diagnosis not present

## 2015-06-29 DIAGNOSIS — E039 Hypothyroidism, unspecified: Secondary | ICD-10-CM | POA: Diagnosis not present

## 2015-06-29 DIAGNOSIS — E559 Vitamin D deficiency, unspecified: Secondary | ICD-10-CM | POA: Diagnosis not present

## 2015-06-29 DIAGNOSIS — D696 Thrombocytopenia, unspecified: Secondary | ICD-10-CM | POA: Diagnosis not present

## 2015-06-29 DIAGNOSIS — I10 Essential (primary) hypertension: Secondary | ICD-10-CM | POA: Diagnosis not present

## 2015-06-29 DIAGNOSIS — E7800 Pure hypercholesterolemia, unspecified: Secondary | ICD-10-CM | POA: Diagnosis not present

## 2015-07-06 ENCOUNTER — Ambulatory Visit (INDEPENDENT_AMBULATORY_CARE_PROVIDER_SITE_OTHER): Payer: Medicare Other | Admitting: Internal Medicine

## 2015-07-06 ENCOUNTER — Encounter: Payer: Self-pay | Admitting: Internal Medicine

## 2015-07-06 VITALS — BP 132/82 | HR 70 | Ht 64.0 in | Wt 236.2 lb

## 2015-07-06 DIAGNOSIS — K7581 Nonalcoholic steatohepatitis (NASH): Secondary | ICD-10-CM

## 2015-07-06 DIAGNOSIS — R945 Abnormal results of liver function studies: Secondary | ICD-10-CM

## 2015-07-06 DIAGNOSIS — R932 Abnormal findings on diagnostic imaging of liver and biliary tract: Secondary | ICD-10-CM | POA: Diagnosis not present

## 2015-07-06 DIAGNOSIS — Z23 Encounter for immunization: Secondary | ICD-10-CM

## 2015-07-06 DIAGNOSIS — K7460 Unspecified cirrhosis of liver: Secondary | ICD-10-CM

## 2015-07-06 DIAGNOSIS — R7989 Other specified abnormal findings of blood chemistry: Secondary | ICD-10-CM

## 2015-07-06 NOTE — Progress Notes (Signed)
HISTORY OF PRESENT ILLNESS:  Crystal Barnett is a 75 y.o. female with multiple medical problems as listed below who presents today for follow-up regarding hepatic cirrhosis, abnormal lab tests, and abnormal hepatic imaging. The patient was initially seen as a new GI patient 06/09/2015 after being referred by hematology regarding newly diagnosed hepatic cirrhosis. The patient has been known to have long-standing fatty liver disease. She was sent to hematology regarding chronic worsening thrombocytopenia and mild leukopenia. Imaging revealed hepatic cirrhosis with indeterminate lesion in the left liver measuring maximal diameter of 2 cm which was evaluated by both ultrasound and MRI. Multiple laboratories were obtained at her last evaluation assessing for other possible causes for cirrhosis. These were all normal or negative except for an insignificantly elevated ANA with normal anti-smooth muscle antibody. Normal globulins. She is not immune to either hepatitis A or B. She remains chronically overweight with BMI greater than 40. She did have a mild elevation of ammonia (49) for which she was started on lactulose despite no overt encephalopathy symptoms. She was set up today for this follow-up to review all of her workup as well as clinical impression and plans moving forward. Patient reports that lactulose resulted in terrible intestinal gas and diarrhea. Otherwise, no changes. No complaints. She does have multiple appropriate questions. Of note. She last underwent complete colonoscopy with Dr. Brayton Mars  05/28/2006. This was negative except for diminutive non-adenomatous polyp which was removed. Follow-up in 10 years was recommended.  REVIEW OF SYSTEMS:  All non-GI ROS negative except for allergies  Past Medical History  Diagnosis Date  . GOITER, MULTINODULAR 01/12/2008  . HYPOTHYROIDISM 09/07/2008  . ALLERGIC RHINITIS 01/12/2008  . Depression   . Hypertension   . Hyperlipidemia   . Thrombocytopenia ([place])      Appears chronic from at least 2014  . 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 Diabetes ([place])   . Fatty liver   . Vitamin D deficiency   . Hepatic cirrhosis ([place])   . Splenomegaly   . Colon polyps     hyperplastic    Past Surgical History  Procedure Laterality Date  . Cholecystectomy  1985  . Breast surgery  2008    Breast Biopsy   . Arthroscopic left knee  2005    Social History Crystal Barnett  reports that she quit smoking about 25 years ago. She does not have any smokeless tobacco history on file. Her alcohol and drug histories are not on file.  family history includes Goiter in her maternal grandmother; Stroke (age of onset: 40) in her father.  No Known Allergies     PHYSICAL EXAMINATION: Vital signs: BP 132/82 mmHg  Pulse 70  Ht _1  (1.626 m)  Wt 236 lb 3.2 oz (107.14 kg)  BMI 40.52 kg/m2  Constitutional: Pleasant, obese generally well-appearing, no acute distress Face: Hypervascular facial skin in the malar region bilaterally Psychiatric: alert and oriented x3, cooperative Eyes: extraocular movements intact, anicteric, conjunctiva pink Mouth: oral pharynx moist, no lesions. Normal tongue hue Neck: supple, thick, no thyromegaly Lymph: no lymphadenopathy Cardiovascular: heart regular rate and rhythm, no murmur Lungs: clear to auscultation bilaterally Abdomen: soft, obese, nontender, nondistended, no obvious ascites, no peritoneal signs, normal bowel sounds, no organomegaly Rectal: Omitted Extremities: no clubbing, cyanosis, or lower extremity edema bilaterally Skin: no lesions on visible extremities Neuro: No focal deficits. Cranial nerves intact. No asterixis.  ASSESSMENT:  #1. NASH cirrhosis.  Compensated #2. Indeterminate liver lesion. Tolerating followed radiographically. #3. Elevated ammonia. Not clinically significant without clinical evidence for encephalopathy. Significant side effects from  lactulose #4. Hepatitis A and hepatitis B nave #5. Morbid obesity. Ongoing #6. Colonoscopy 2008 negative #7. Multiple medical problems   PLAN:  #1. Long discussion today on NASH cirrhosis. Also discussed multiple potential complications of liver disease including encephalopathy, ascites, edema, and issues with portal hypertension such as variceal bleeding. #2. Exercise and weight loss #3. Screening upper endoscopy to rule out varices.The nature of the procedure, as well as the risks, benefits, and alternatives were carefully and thoroughly reviewed with the patient. Ample time for discussion and questions allowed. The patient understood, was satisfied, and agreed to proceed. #4. Keep planned MRI in April as already arranged by hematology #5. Begin Twinrix vaccination series against hepatitis A and B #6. Stop lactulose #7. Recall colonoscopy in the system for January 2018. Unclear if this will be necessary given her age, comorbidity, and last exam being negative, but we will enter in recall system #8. Routine GI office follow-up one year #9. Ongoing general medical care with PCP and other specialists  1 hour was spent face-to-face with the patient. The overwhelming majority of the time was used for counseling regarding the myriad of issues related to her liver disease and discussing with her her multiple test results and their meaning. As well, answering multiple questions

## 2015-07-06 NOTE — Patient Instructions (Signed)
You have been given a twin rix injection today.  Please return on ____________   For the 2nd injection.  You have a recall colonoscopy scheduled for 06/2016  You have been scheduled for an endoscopy. Please follow written instructions given to you at your visit today. If you use inhalers (even only as needed), please bring them with you on the day of your procedure. Your physician has requested that you go to www.startemmi.com and enter the access code given to you at your visit today. This web site gives a general overview about your procedure. However, you should still follow specific instructions given to you by our office regarding your preparation for the procedure.

## 2015-07-13 ENCOUNTER — Telehealth: Payer: Self-pay | Admitting: Hematology

## 2015-07-13 NOTE — Telephone Encounter (Signed)
Spoke w/ pt to notify of appt change. Pt is aware of change and confirmed new appt time and date

## 2015-07-20 ENCOUNTER — Encounter: Payer: Self-pay | Admitting: Internal Medicine

## 2015-07-20 ENCOUNTER — Ambulatory Visit (AMBULATORY_SURGERY_CENTER): Payer: Medicare Other | Admitting: Internal Medicine

## 2015-07-20 VITALS — BP 124/55 | HR 76 | Temp 98.0°F | Resp 15 | Ht 64.0 in | Wt 236.0 lb

## 2015-07-20 DIAGNOSIS — I10 Essential (primary) hypertension: Secondary | ICD-10-CM | POA: Diagnosis not present

## 2015-07-20 DIAGNOSIS — R945 Abnormal results of liver function studies: Secondary | ICD-10-CM | POA: Diagnosis not present

## 2015-07-20 DIAGNOSIS — K7581 Nonalcoholic steatohepatitis (NASH): Secondary | ICD-10-CM | POA: Diagnosis not present

## 2015-07-20 DIAGNOSIS — Z13810 Encounter for screening for upper gastrointestinal disorder: Secondary | ICD-10-CM

## 2015-07-20 DIAGNOSIS — K7460 Unspecified cirrhosis of liver: Secondary | ICD-10-CM

## 2015-07-20 DIAGNOSIS — R933 Abnormal findings on diagnostic imaging of other parts of digestive tract: Secondary | ICD-10-CM | POA: Diagnosis not present

## 2015-07-20 DIAGNOSIS — E119 Type 2 diabetes mellitus without complications: Secondary | ICD-10-CM | POA: Diagnosis not present

## 2015-07-20 DIAGNOSIS — E039 Hypothyroidism, unspecified: Secondary | ICD-10-CM | POA: Diagnosis not present

## 2015-07-20 LAB — GLUCOSE, CAPILLARY
GLUCOSE-CAPILLARY: 98 mg/dL (ref 65–99)
Glucose-Capillary: 102 mg/dL — ABNORMAL HIGH (ref 65–99)

## 2015-07-20 MED ORDER — SODIUM CHLORIDE 0.9 % IV SOLN: 500.0000 mL | INTRAVENOUS | Status: DC

## 2015-07-20 NOTE — Op Note (Signed)
[facility] Patient Name: Crystal Barnett Procedure Date: 07/20/2015 1:29 PM MRN: [id number] Endoscopist: Docia Chuck. Henrene Pastor , MD Age: 75 Referring MD:  Date of Birth: Jan 27, 1941 Gender: Female Procedure:                Upper GI endoscopy Indications:              Screening procedure. Patient with NASH cirrhosis.                            Rule out varices Medicines:                Monitored Anesthesia Care Procedure:                Pre-Anesthesia Assessment:                           - Prior to the procedure, a History and Physical                            was performed, and patient medications and                            allergies were reviewed. The patient's tolerance of                            previous anesthesia was also reviewed. The risks                            and benefits of the procedure and the sedation                            options and risks were discussed with the patient.                            All questions were answered, and informed consent                            was obtained. Prior Anticoagulants: The patient has                            taken no previous anticoagulant or antiplatelet                            agents. ASA Grade Assessment: III - A patient with                            severe systemic disease. After reviewing the risks                            and benefits, the patient was deemed in                            satisfactory condition to undergo the procedure.  After obtaining informed consent, the endoscope was                            passed under direct vision. Throughout the                            procedure, the patient's blood pressure, pulse, and                            oxygen saturations were monitored continuously. The                            Model GIF-HQ190 [id number]) scope was introduced                            through the mouth, and advanced to the second part                     of duodenum. The upper GI endoscopy was                            accomplished without difficulty. The patient                            tolerated the procedure well. Scope In: Scope Out: Findings:      The esophagus was normal. No evidence for esophageal varices      The stomach was normal, except for very subtle portal gastropathy.      The examined duodenum was normal. Complications:            No immediate complications. Estimated Blood Loss:     Estimated blood loss: none. Impression:               - Normal esophagus.                           - Normal stomach, except for very subtle portal                            gastropathy.                           - Normal examined duodenum.                           - No specimens collected. Recommendation:           - Resume previous diet.                           - Continue present medications.                           - Repeat upper endoscopy in 2-3 years for screening                            purposes.                           -  Return to GI clinic, to see Dr. Henrene Pastor, in 6                            months.                           -Resume general medical care with your primary                            provider Procedure Code(s):        --- Professional ---                           [phone], Esophagogastroduodenoscopy, flexible,                            transoral; diagnostic, including collection of                            specimen(s) by brushing or washing, when performed                            (separate procedure) CPT copyright 2016 American Medical Association. All rights reserved. Docia Chuck. Henrene Pastor, MD Docia Chuck. Henrene Pastor, MD 07/20/2015 1:55:38 PM This report has been signed electronically. Number of Addenda: 0

## 2015-07-20 NOTE — Progress Notes (Signed)
Report to PACU, RN, vss, BBS= Clear.  

## 2015-07-20 NOTE — Patient Instructions (Signed)
YOU HAD AN ENDOSCOPIC PROCEDURE TODAY AT [facility]:   Refer to the procedure report that was given to you for any specific questions about what was found during the examination.  If the procedure report does not answer your questions, please call your gastroenterologist to clarify.  If you requested that your care partner not be given the details of your procedure findings, then the procedure report has been included in a sealed envelope for you to review at your convenience later.  YOU SHOULD EXPECT: Some feelings of bloating in the abdomen. Passage of more gas than usual.  Walking can help get rid of the air that was put into your GI tract during the procedure and reduce the bloating. If you had a lower endoscopy (such as a colonoscopy or flexible sigmoidoscopy) you may notice spotting of blood in your stool or on the toilet paper. If you underwent a bowel prep for your procedure, you may not have a normal bowel movement for a few days.  Please Note:  You might notice some irritation and congestion in your nose or some drainage.  This is from the oxygen used during your procedure.  There is no need for concern and it should clear up in a day or so.  SYMPTOMS TO REPORT IMMEDIATELY:   Following upper endoscopy (EGD)  Vomiting of blood or coffee ground material  New chest pain or pain under the shoulder blades  Painful or persistently difficult swallowing  New shortness of breath  Fever of 100F or higher  Black, tarry-looking stools  For urgent or emergent issues, a gastroenterologist can be reached at any hour by calling [phone].   DIET: Your first meal following the procedure should be a small meal and then it is ok to progress to your normal diet. Heavy or fried foods are harder to digest and may make you feel nauseous or bloated.  Likewise, meals heavy in dairy and vegetables can increase bloating.  Drink plenty of fluids but you should avoid alcoholic beverages for  24 hours.  ACTIVITY:  You should plan to take it easy for the rest of today and you should NOT DRIVE or use heavy machinery until tomorrow (because of the sedation medicines used during the test).    FOLLOW UP: Our staff will call the number listed on your records the next business day following your procedure to check on you and address any questions or concerns that you may have regarding the information given to you following your procedure. If we do not reach you, we will leave a message.  However, if you are feeling well and you are not experiencing any problems, there is no need to return our call.  We will assume that you have returned to your regular daily activities without incident.  If any biopsies were taken you will be contacted by phone or by letter within the next 1-3 weeks.  Please call us at [phone] if you have not heard about the biopsies in 3 weeks.    SIGNATURES/CONFIDENTIALITY: You and/or your care partner have signed paperwork which will be entered into your electronic medical record.  These signatures attest to the fact that that the information above on your After Visit Summary has been reviewed and is understood.  Full responsibility of the confidentiality of this discharge information lies with you and/or your care-partner.  Thank you for letting us take care of your healthcare needs.

## 2015-07-21 ENCOUNTER — Telehealth: Payer: Self-pay | Admitting: *Deleted

## 2015-07-21 NOTE — Telephone Encounter (Signed)
  Follow up Call-  Call back number 07/20/2015  Post procedure Call Back phone  # [phone]  Permission to leave phone message Yes     Patient questions:  Do you have a fever, pain , or abdominal swelling? No. Pain Score  0 *  Have you tolerated food without any problems? Yes.    Have you been able to return to your normal activities? Yes.    Do you have any questions about your discharge instructions: Diet   No. Medications  No. Follow up visit  No.  Do you have questions or concerns about your Care? No.  Actions: * If pain score is 4 or above: No action needed, pain <4.

## 2015-08-04 ENCOUNTER — Ambulatory Visit (INDEPENDENT_AMBULATORY_CARE_PROVIDER_SITE_OTHER): Payer: Medicare Other | Admitting: Internal Medicine

## 2015-08-04 DIAGNOSIS — K7581 Nonalcoholic steatohepatitis (NASH): Secondary | ICD-10-CM

## 2015-08-04 DIAGNOSIS — Z23 Encounter for immunization: Secondary | ICD-10-CM

## 2015-08-04 DIAGNOSIS — K7460 Unspecified cirrhosis of liver: Secondary | ICD-10-CM

## 2015-08-10 ENCOUNTER — Ambulatory Visit: Payer: Medicare Other

## 2015-08-17 ENCOUNTER — Other Ambulatory Visit (HOSPITAL_BASED_OUTPATIENT_CLINIC_OR_DEPARTMENT_OTHER): Payer: Medicare Other

## 2015-08-17 DIAGNOSIS — K7460 Unspecified cirrhosis of liver: Secondary | ICD-10-CM

## 2015-08-17 DIAGNOSIS — K7581 Nonalcoholic steatohepatitis (NASH): Secondary | ICD-10-CM

## 2015-08-17 DIAGNOSIS — D696 Thrombocytopenia, unspecified: Secondary | ICD-10-CM

## 2015-08-17 LAB — COMPREHENSIVE METABOLIC PANEL
ALT: 27 U/L (ref 0–55)
ANION GAP: 6 meq/L (ref 3–11)
AST: 32 U/L (ref 5–34)
Albumin: 3.4 g/dL — ABNORMAL LOW (ref 3.5–5.0)
Alkaline Phosphatase: 56 U/L (ref 40–150)
BUN: 9.5 mg/dL (ref 7.0–26.0)
CHLORIDE: 107 meq/L (ref 98–109)
CO2: 29 meq/L (ref 22–29)
CREATININE: 0.7 mg/dL (ref 0.6–1.1)
Calcium: 8.8 mg/dL (ref 8.4–10.4)
EGFR: 82 mL/min/{1.73_m2} — AB (ref >90)
Glucose: 85 mg/dl (ref 70–140)
POTASSIUM: 4 meq/L (ref 3.5–5.1)
Sodium: 142 mEq/L (ref 136–145)
Total Bilirubin: 0.44 mg/dL (ref 0.20–1.20)
Total Protein: 6.4 g/dL (ref 6.4–8.3)

## 2015-08-17 LAB — CBC & DIFF AND RETIC
BASO%: 0.3 % (ref 0.0–2.0)
Basophils Absolute: 0 10*3/uL (ref 0.0–0.1)
EOS%: 2.8 % (ref 0.0–7.0)
Eosinophils Absolute: 0.1 10*3/uL (ref 0.0–0.5)
HCT: 37.9 % (ref 34.8–46.6)
HGB: 13.1 g/dL (ref 11.6–15.9)
Immature Retic Fract: 3.9 % (ref 1.60–10.00)
LYMPH#: 1.1 10*3/uL (ref 0.9–3.3)
LYMPH%: 33.5 % (ref 14.0–49.7)
MCH: 31.4 pg (ref 25.1–34.0)
MCHC: 34.6 g/dL (ref 31.5–36.0)
MCV: 90.9 fL (ref 79.5–101.0)
MONO#: 0.3 10*3/uL (ref 0.1–0.9)
MONO%: 9.1 % (ref 0.0–14.0)
NEUT#: 1.7 10*3/uL (ref 1.5–6.5)
NEUT%: 54.3 % (ref 38.4–76.8)
PLATELETS: 89 10*3/uL — AB (ref 145–400)
RBC: 4.17 10*6/uL (ref 3.70–5.45)
RDW: 13.3 % (ref 11.2–14.5)
RETIC CT ABS: 65.05 10*3/uL (ref 33.70–90.70)
Retic %: 1.56 % (ref 0.70–2.10)
WBC: 3.2 10*3/uL — ABNORMAL LOW (ref 3.9–10.3)

## 2015-08-18 ENCOUNTER — Ambulatory Visit (HOSPITAL_COMMUNITY)
Admission: RE | Admit: 2015-08-18 | Discharge: 2015-08-18 | Disposition: A | Discharge status: Home / Self Care | Payer: Medicare Other | Source: Ambulatory Visit | Attending: Hematology | Admitting: Hematology

## 2015-08-18 DIAGNOSIS — K769 Liver disease, unspecified: Secondary | ICD-10-CM

## 2015-08-18 DIAGNOSIS — K760 Fatty (change of) liver, not elsewhere classified: Secondary | ICD-10-CM | POA: Insufficient documentation

## 2015-08-18 DIAGNOSIS — K7460 Unspecified cirrhosis of liver: Secondary | ICD-10-CM | POA: Diagnosis not present

## 2015-08-18 DIAGNOSIS — K7689 Other specified diseases of liver: Secondary | ICD-10-CM | POA: Insufficient documentation

## 2015-08-18 LAB — POCT I-STAT CREATININE: Creatinine, Ser: 0.6 mg/dL (ref 0.44–1.00)

## 2015-08-18 MED ORDER — GADOXETATE DISODIUM 0.25 MMOL/ML IV SOLN
10.0000 mL | Freq: Once | INTRAVENOUS | Status: AC | PRN
  Administered 2015-08-18: 10 mL via INTRAVENOUS

## 2015-08-21 ENCOUNTER — Ambulatory Visit: Payer: Medicare Other | Admitting: Hematology

## 2015-08-23 ENCOUNTER — Telehealth: Payer: Self-pay | Admitting: *Deleted

## 2015-08-23 NOTE — Telephone Encounter (Signed)
VM message from pt received @ 1151 inquiring about medicine to take for her sinuses, medicine that will not affect her liver.  Attempted call call back-no answer. VM message left for pt to call back.  @ 12:35 pm patient returned call.  She asked if it was ok to take Mucinex for her sinus symptoms.  Discussed her symptoms and explained that the mucinex is more for a cough and thinning her secretions than for sinus issues and runny nose. Pt is taking Flonase and Singular already. She states she had a fever over the weekend. Advised pt to contact her PCP and that she may need to be seen by her.  Pt voiced understanding and stated she will contact her PCP.

## 2015-08-25 ENCOUNTER — Ambulatory Visit
Admission: RE | Admit: 2015-08-25 | Discharge: 2015-08-25 | Disposition: A | Discharge status: Home / Self Care | Payer: Medicare Other | Source: Ambulatory Visit

## 2015-08-25 ENCOUNTER — Other Ambulatory Visit: Payer: Self-pay | Admitting: *Deleted

## 2015-08-25 DIAGNOSIS — C228 Malignant neoplasm of liver, primary, unspecified as to type: Secondary | ICD-10-CM

## 2015-08-25 DIAGNOSIS — K769 Liver disease, unspecified: Secondary | ICD-10-CM

## 2015-08-25 DIAGNOSIS — Z1231 Encounter for screening mammogram for malignant neoplasm of breast: Secondary | ICD-10-CM | POA: Diagnosis not present

## 2015-08-25 DIAGNOSIS — D696 Thrombocytopenia, unspecified: Secondary | ICD-10-CM

## 2015-08-28 ENCOUNTER — Telehealth: Payer: Self-pay | Admitting: Hematology

## 2015-08-28 ENCOUNTER — Other Ambulatory Visit (HOSPITAL_BASED_OUTPATIENT_CLINIC_OR_DEPARTMENT_OTHER): Payer: Medicare Other

## 2015-08-28 ENCOUNTER — Encounter: Payer: Self-pay | Admitting: Hematology

## 2015-08-28 ENCOUNTER — Other Ambulatory Visit: Payer: Self-pay | Admitting: Obstetrics and Gynecology

## 2015-08-28 ENCOUNTER — Ambulatory Visit (HOSPITAL_BASED_OUTPATIENT_CLINIC_OR_DEPARTMENT_OTHER): Payer: Medicare Other | Admitting: Hematology

## 2015-08-28 VITALS — BP 126/46 | HR 74 | Temp 97.5°F | Resp 18 | Ht 64.0 in | Wt 223.7 lb

## 2015-08-28 DIAGNOSIS — R161 Splenomegaly, not elsewhere classified: Secondary | ICD-10-CM

## 2015-08-28 DIAGNOSIS — D696 Thrombocytopenia, unspecified: Secondary | ICD-10-CM | POA: Diagnosis not present

## 2015-08-28 DIAGNOSIS — K769 Liver disease, unspecified: Secondary | ICD-10-CM

## 2015-08-28 DIAGNOSIS — K7460 Unspecified cirrhosis of liver: Secondary | ICD-10-CM | POA: Diagnosis not present

## 2015-08-28 DIAGNOSIS — C228 Malignant neoplasm of liver, primary, unspecified as to type: Secondary | ICD-10-CM | POA: Diagnosis not present

## 2015-08-28 DIAGNOSIS — K7689 Other specified diseases of liver: Secondary | ICD-10-CM

## 2015-08-28 DIAGNOSIS — R928 Other abnormal and inconclusive findings on diagnostic imaging of breast: Secondary | ICD-10-CM

## 2015-08-28 LAB — CBC & DIFF AND RETIC
BASO%: 0.3 % (ref 0.0–2.0)
Basophils Absolute: 0 10*3/uL (ref 0.0–0.1)
EOS ABS: 0.1 10*3/uL (ref 0.0–0.5)
EOS%: 1.3 % (ref 0.0–7.0)
HCT: 40.3 % (ref 34.8–46.6)
HGB: 13.9 g/dL (ref 11.6–15.9)
IMMATURE RETIC FRACT: 9.4 % (ref 1.60–10.00)
LYMPH#: 1.4 10*3/uL (ref 0.9–3.3)
LYMPH%: 37 % (ref 14.0–49.7)
MCH: 30.8 pg (ref 25.1–34.0)
MCHC: 34.5 g/dL (ref 31.5–36.0)
MCV: 89.2 fL (ref 79.5–101.0)
MONO#: 0.3 10*3/uL (ref 0.1–0.9)
MONO%: 6.5 % (ref 0.0–14.0)
NEUT%: 54.9 % (ref 38.4–76.8)
NEUTROS ABS: 2.1 10*3/uL (ref 1.5–6.5)
NRBC: 0 % (ref 0–0)
PLATELETS: 108 10*3/uL — AB (ref 145–400)
RBC: 4.52 10*6/uL (ref 3.70–5.45)
RDW: 13 % (ref 11.2–14.5)
RETIC %: 1.44 % (ref 0.70–2.10)
RETIC CT ABS: 65.09 10*3/uL (ref 33.70–90.70)
WBC: 3.8 10*3/uL — ABNORMAL LOW (ref 3.9–10.3)

## 2015-08-28 LAB — COMPREHENSIVE METABOLIC PANEL
ALT: 32 U/L (ref 0–55)
AST: 44 U/L — AB (ref 5–34)
Albumin: 3.6 g/dL (ref 3.5–5.0)
Alkaline Phosphatase: 48 U/L (ref 40–150)
Anion Gap: 10 mEq/L (ref 3–11)
BILIRUBIN TOTAL: 0.79 mg/dL (ref 0.20–1.20)
BUN: 11 mg/dL (ref 7.0–26.0)
CHLORIDE: 105 meq/L (ref 98–109)
CO2: 25 meq/L (ref 22–29)
CREATININE: 0.7 mg/dL (ref 0.6–1.1)
Calcium: 9.2 mg/dL (ref 8.4–10.4)
EGFR: 82 mL/min/{1.73_m2} — ABNORMAL LOW (ref >90)
GLUCOSE: 107 mg/dL (ref 70–140)
Potassium: 3.8 mEq/L (ref 3.5–5.1)
SODIUM: 140 meq/L (ref 136–145)
TOTAL PROTEIN: 6.9 g/dL (ref 6.4–8.3)

## 2015-08-28 NOTE — Telephone Encounter (Signed)
per pof to sch pt appt-gave pt copy of avs °

## 2015-08-28 NOTE — Progress Notes (Signed)
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 She notes that she has been following a strict diet and has lost 20 pounds through appropriate dietary restrictions. Her platelet counts are improved and stable. MRI of the liver for follow-up shows stable liver nodules with no signs of increase in size in the interval. She notes that her energy levels have improved since her weight loss. No other acute new symptoms.   REVIEW OF SYSTEMS:    10 Point review of systems of done and is negative except as noted above.  . Past Medical History  Diagnosis Date  . GOITER, MULTINODULAR 01/12/2008  . HYPOTHYROIDISM 09/07/2008  . ALLERGIC RHINITIS 01/12/2008  . Depression   . Hypertension   . Hyperlipidemia   . Thrombocytopenia ([place])     Appears chronic from at least 2014  . 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 Diabetes ([place])   . Fatty liver   . Vitamin D deficiency   . 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 Splenomegaly   . Colon polyps     hyperplastic    . Past Surgical History  Procedure Laterality Date  . Cholecystectomy  1985  . Breast surgery  2008    Breast Biopsy   . Arthroscopic left knee  2005    . Social History  Substance Use Topics  . Smoking status: Former Smoker    Quit date: 05/13/1990  . Smokeless tobacco: Never Used  . Alcohol Use: No    ALLERGIES:  has No Known Allergies.  MEDICATIONS:  Current  Outpatient Prescriptions  Medication Sig Dispense Refill  . cholecalciferol (VITAMIN D) 1000 UNITS tablet Take 2,000 Units by mouth daily.    . fluticasone (FLONASE) 50 MCG/ACT nasal spray Place into both nostrils daily.    [facility] levothyroxine (SYNTHROID, LEVOTHROID) 50 MCG tablet Take 1 tablet (50 mcg total) by mouth daily. 90 tablet 3  . lisinopril-hydrochlorothiazide (PRINZIDE,ZESTORETIC) 10-12.5 MG tablet     . metFORMIN (GLUCOPHAGE) 500 MG tablet Take 1 tablet by mouth once daily     . montelukast (SINGULAIR) 10 MG tablet     . Multiple Vitamin (MULTIVITAMIN) tablet Take 1 tablet by mouth daily.    . raloxifene (EVISTA) 60 MG tablet Take 60 mg by mouth daily.     No current facility-administered medications for this visit.    PHYSICAL EXAMINATION: ECOG PERFORMANCE STATUS: 1 - Symptomatic but completely ambulatory  . Filed Vitals:   08/28/15 0916  BP: 126/46  Pulse: 74  Temp: 97.5 F (36.4 C)  Resp: 18    Filed Weights   08/28/15 0916  Weight: 223 lb 11.2 oz (101.47 kg)   .Body mass index is 38.38 kg/(m^2).  GENERAL:alert, in no acute distress and comfortable SKIN: skin color, texture, turgor are normal, no rashes or significant lesions EYES: normal, conjunctiva are pink and non-injected, sclera clear OROPHARYNX:no exudate, no erythema and lips, buccal mucosa, and tongue normal  NECK: supple,  no JVD, thyroid normal size, non-tender, without nodularity LYMPH:  no palpable lymphadenopathy in the cervical, axillary or inguinal LUNGS: clear to auscultation with normal respiratory effort HEART: regular rate & rhythm,  no murmurs and no lower extremity edema ABDOMEN: abdomen soft, non-tender, normoactive bowel sounds  Musculoskeletal: no cyanosis of digits and no clubbing  PSYCH: alert & oriented x 3 with fluent speech NEURO: no focal motor/sensory deficits  LABORATORY DATA:   I have reviewed the data as listed  . CBC Latest Ref Rng 08/28/2015 08/17/2015 06/09/2015  WBC 3.9  - 10.3 10e3/uL 3.8(L) 3.2(L) 4.3  Hemoglobin 11.6 - 15.9 g/dL 13.9 13.1 13.8  Hematocrit 34.8 - 46.6 % 40.3 37.9 40.6  Platelets 145 - 400 10e3/uL 108(L) 89(L) 101.0(L)    . CMP Latest Ref Rng 08/28/2015 08/18/2015 08/17/2015  Glucose 70 - 140 mg/dl 107 - 85  BUN 7.0 - 26.0 mg/dL 11.0 - 9.5  Creatinine 0.6 - 1.1 mg/dL 0.7 0.60 0.7  Sodium 136 - 145 mEq/L 140 - 142  Potassium 3.5 - 5.1 mEq/L 3.8 - 4.0  Chloride 96 - 112 mEq/L - - -  CO2 22 - 29 mEq/L 25 - 29  Calcium 8.4 - 10.4 mg/dL 9.2 - 8.8  Total Protein 6.4 - 8.3 g/dL 6.9 - 6.4  Total Bilirubin 0.20 - 1.20 mg/dL 0.79 - 0.44  Alkaline Phos 40 - 150 U/L 48 - 56  AST 5 - 34 U/L 44(H) - 32  ALT 0 - 55 U/L 32 - 27   MRI ABDOMEN WITHOUT AND WITH CONTRAST 08/18/2015  TECHNIQUE: Multiplanar multisequence MR imaging of the abdomen was performed both before and after the administration of intravenous contrast.  CONTRAST: 10 mL Eovist IV  COMPARISON: 05/04/2015  FINDINGS: Lower chest: Lung bases are clear.  Hepatobiliary: Macronodular cirrhosis. Mild hepatic steatosis. Subtle T2 hyperintense lesion in segment 7 (series 11/ image 18), with early arterial enhancement (series 601/image 30) and delayed washout (series 13/ image 30), measuring 9 x 7 mm on the delayed Eovist sequence. Given size less than 10 mm, this does not meet AASLD imaging criteria for diagnosis of hepatocellular carcinoma, and a dysplastic nodule remains possible.  Status post cholecystectomy. No intrahepatic or extrahepatic ductal dilatation.  Pancreas: Within normal limits.  Spleen: Splenomegaly, measuring 14.5 cm in maximal craniocaudal dimension.  Adrenals/Urinary Tract: Adrenal glands are within normal limits.  Kidneys are within normal limits. No hydronephrosis.  Stomach/Bowel: Stomach is within normal limits.  Visualized bowel is unremarkable.  Vascular/Lymphatic: No evidence of abdominal aortic aneurysm.  Portal vein is  patent.  No suspicious abdominal lymphadenopathy.  Other: Trace/mild abdominal ascites with mesenteric fluid.  Musculoskeletal: No focal osseous lesions.  IMPRESSION: Cirrhosis with mild hepatic steatosis.  9 x 7 mm enhancing lesion in hepatic segment 7, possibly reflecting a dysplastic nodule versus small HCC. Given size less than 10 mm, this does not meet AASLD imaging criteria for diagnosis of hepatocellular carcinoma. Consider follow-up MR abdomen with/without contrast in 6-12 months, as clinically warranted.   Electronically Signed  By: Julian Hy M.D.  On: 08/18/2015 10:21   ASSESSMENT & PLAN:   #1 Chronic thrombocytopenia. Platelet counts today are relatively stable improved to 108k from 89k Likely related to cirrhosis related to NASH with mild splenomegaly.  B12, TSH, LDH within normal limits. 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 Recent EGD showed no evidence of varices. Subtle changes of portal hypertensive gastropathy.  #  3 left hepatic 2 cm liver lesion rule out [place]. MRI liver did not indentify this lesion but noted a hypervascular 1.2cm lesion in the rt hepatic dome without clear radiographic features of overt HCC ?dysplastic nodule Repeat MRI of the liver on 08/18/2015 -9 x 7 mm enhancing lesion in hepatic segment 7, possibly reflecting a dysplastic nodule versus small HCC. Given size less than 10 mm, this does not meet AASLD imaging criteria for diagnosis of hepatocellular carcinoma.  Plan  -No significant change in the liver nodule in 4 months is somewhat reassuring. -We'll repeat MRI of the liver in 6 months to monitor this likely dysplastic nodule. -AFP tumor marker done today pending. -liver cirrhosis mx per GI and PCP -No indication for treatment of patient's mild thrombocytopenia this time. This is likely related to her splenomegaly and is currently  stable. -Patient is doing a good job with diet and exercise and has lost 20 pounds since her last clinic visit. She notes her energy levels are better. I commend her on this and encouraged her to continue maintaining a healthy lifestyle.  Return to care with Dr. Irene Limbo in 6 months . Earlier if any new concerns. Continue follow-up with primary care physician.  I spent 15 minutes counseling the patient face to face. The total time spent in the appointment was 20 minutes and more than 50% was on counseling and direct patient cares.    Sullivan Lone MD [place] AAHIVMS [facility] Hematology/Oncology Physician [facility]  (Office):       [phone] (Work cell):  [phone] (Fax):           [phone]

## 2015-08-29 DIAGNOSIS — R928 Other abnormal and inconclusive findings on diagnostic imaging of breast: Secondary | ICD-10-CM | POA: Insufficient documentation

## 2015-08-29 LAB — ALPHA FETO PROTEIN (PARALLEL TESTING): AFP-Tumor Marker: 6.3 ng/mL — ABNORMAL HIGH (ref <6.1)

## 2015-08-29 LAB — AFP TUMOR MARKER: AFP, SERUM, TUMOR MARKER: 6 ng/mL (ref 0.0–8.3)

## 2015-09-01 ENCOUNTER — Ambulatory Visit
Admission: RE | Admit: 2015-09-01 | Discharge: 2015-09-01 | Disposition: A | Discharge status: Home / Self Care | Payer: Medicare Other | Source: Ambulatory Visit | Attending: Obstetrics and Gynecology | Admitting: Obstetrics and Gynecology

## 2015-09-01 ENCOUNTER — Other Ambulatory Visit: Payer: Self-pay | Admitting: Obstetrics and Gynecology

## 2015-09-01 DIAGNOSIS — R928 Other abnormal and inconclusive findings on diagnostic imaging of breast: Secondary | ICD-10-CM

## 2015-09-01 DIAGNOSIS — R921 Mammographic calcification found on diagnostic imaging of breast: Secondary | ICD-10-CM

## 2015-09-08 ENCOUNTER — Ambulatory Visit
Admission: RE | Admit: 2015-09-08 | Discharge: 2015-09-08 | Disposition: A | Discharge status: Home / Self Care | Payer: Medicare Other | Source: Ambulatory Visit | Attending: Obstetrics and Gynecology | Admitting: Obstetrics and Gynecology

## 2015-09-08 DIAGNOSIS — R921 Mammographic calcification found on diagnostic imaging of breast: Secondary | ICD-10-CM

## 2015-09-08 DIAGNOSIS — D242 Benign neoplasm of left breast: Secondary | ICD-10-CM | POA: Diagnosis not present

## 2015-10-17 ENCOUNTER — Encounter: Payer: Self-pay | Admitting: *Deleted

## 2015-11-27 ENCOUNTER — Telehealth: Payer: Self-pay | Admitting: Internal Medicine

## 2015-11-27 NOTE — Telephone Encounter (Signed)
Pt scheduled for her last Twinrix vaccine scheduled for 01/03/16@9 :30am. Pt aware of appt.

## 2015-12-13 DIAGNOSIS — H903 Sensorineural hearing loss, bilateral: Secondary | ICD-10-CM | POA: Diagnosis not present

## 2015-12-27 DIAGNOSIS — E119 Type 2 diabetes mellitus without complications: Secondary | ICD-10-CM | POA: Diagnosis not present

## 2015-12-27 DIAGNOSIS — I10 Essential (primary) hypertension: Secondary | ICD-10-CM | POA: Diagnosis not present

## 2015-12-27 DIAGNOSIS — Z1389 Encounter for screening for other disorder: Secondary | ICD-10-CM | POA: Diagnosis not present

## 2015-12-27 DIAGNOSIS — K7581 Nonalcoholic steatohepatitis (NASH): Secondary | ICD-10-CM | POA: Diagnosis not present

## 2015-12-27 DIAGNOSIS — E7800 Pure hypercholesterolemia, unspecified: Secondary | ICD-10-CM | POA: Diagnosis not present

## 2015-12-27 DIAGNOSIS — E559 Vitamin D deficiency, unspecified: Secondary | ICD-10-CM | POA: Diagnosis not present

## 2015-12-27 DIAGNOSIS — Z23 Encounter for immunization: Secondary | ICD-10-CM | POA: Diagnosis not present

## 2015-12-27 DIAGNOSIS — D696 Thrombocytopenia, unspecified: Secondary | ICD-10-CM | POA: Diagnosis not present

## 2015-12-27 DIAGNOSIS — Z7984 Long term (current) use of oral hypoglycemic drugs: Secondary | ICD-10-CM | POA: Diagnosis not present

## 2015-12-27 DIAGNOSIS — E039 Hypothyroidism, unspecified: Secondary | ICD-10-CM | POA: Diagnosis not present

## 2015-12-27 DIAGNOSIS — Z6839 Body mass index (BMI) 39.0-39.9, adult: Secondary | ICD-10-CM | POA: Diagnosis not present

## 2015-12-27 DIAGNOSIS — J302 Other seasonal allergic rhinitis: Secondary | ICD-10-CM | POA: Diagnosis not present

## 2016-01-03 ENCOUNTER — Ambulatory Visit (INDEPENDENT_AMBULATORY_CARE_PROVIDER_SITE_OTHER): Payer: Medicare Other | Admitting: Internal Medicine

## 2016-01-03 DIAGNOSIS — K7581 Nonalcoholic steatohepatitis (NASH): Secondary | ICD-10-CM | POA: Diagnosis not present

## 2016-01-03 DIAGNOSIS — Z23 Encounter for immunization: Secondary | ICD-10-CM

## 2016-01-03 DIAGNOSIS — K7460 Unspecified cirrhosis of liver: Secondary | ICD-10-CM

## 2016-01-22 ENCOUNTER — Encounter (INDEPENDENT_AMBULATORY_CARE_PROVIDER_SITE_OTHER): Payer: Self-pay

## 2016-01-22 ENCOUNTER — Ambulatory Visit (INDEPENDENT_AMBULATORY_CARE_PROVIDER_SITE_OTHER): Payer: Medicare Other | Admitting: Internal Medicine

## 2016-01-22 ENCOUNTER — Encounter: Payer: Self-pay | Admitting: Internal Medicine

## 2016-01-22 VITALS — BP 138/72 | HR 72 | Ht 64.0 in | Wt 237.0 lb

## 2016-01-22 DIAGNOSIS — R932 Abnormal findings on diagnostic imaging of liver and biliary tract: Secondary | ICD-10-CM

## 2016-01-22 DIAGNOSIS — K7460 Unspecified cirrhosis of liver: Secondary | ICD-10-CM

## 2016-01-22 DIAGNOSIS — K7581 Nonalcoholic steatohepatitis (NASH): Secondary | ICD-10-CM

## 2016-01-22 NOTE — Progress Notes (Signed)
HISTORY OF PRESENT ILLNESS:  Crystal Barnett is a 75 y.o. 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 Also followed by hematology for thrombocytopenia and indeterminate liver lesion being monitored radiologically by them. I last saw the patient 07/06/2015. At that time long educational discussion on NASH cirrhosis. She did undergo screening upper endoscopy. No varices. Relook endoscopy in 2-3 years recommended. As she was hepatitis A and B nave she did complete Twinrix vaccination series. Though she understands the importance of exercise and weight loss, she has not had success with either. She presents today for her routine follow-up as requested. She denies interval problems. No significant issues with edema, altered mental status, or GI bleeding. Her weight is unchanged from previous visit. BMI currently 40.68.  REVIEW OF SYSTEMS:  All non-GI ROS negative upon comprehensive review  Past Medical History:  Diagnosis Date  . ALLERGIC RHINITIS 01/12/2008  . Colon polyps    hyperplastic  . Depression   . Diabetes ([place])   . Fatty liver   . GOITER, MULTINODULAR 01/12/2008  . 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 Hyperlipidemia   . Hypertension   . HYPOTHYROIDISM 09/07/2008  . 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 Splenomegaly   . Thrombocytopenia ([place])    Appears chronic from at least 2014  . Vitamin D deficiency     Past Surgical History:  Procedure Laterality Date  . arthroscopic left knee  2005  . BREAST SURGERY  2008   Breast Biopsy   . CHOLECYSTECTOMY  1985    Social History Crystal Barnett  reports that she quit smoking about 25 years ago. She has never used smokeless tobacco. She reports that she does not drink alcohol or use drugs.  family history includes Goiter in her maternal grandmother; Stroke (age of onset: 81) in her father.  No Known Allergies      PHYSICAL EXAMINATION: Vital signs: BP 138/72 (BP Location: Left Arm, Patient Position: Sitting, Cuff Size: Large)   Pulse 72   Ht 5' 4"  (1.626 m)   Wt 237 lb (107.5 kg)   BMI 40.68 kg/m   Constitutional: Pleasant, obese, generally well-appearing, no acute distress Psychiatric: alert and oriented x3, cooperative Eyes: extraocular movements intact, anicteric, conjunctiva pink Mouth: oral pharynx moist, no lesions. Normal tongue hue Neck: supple no lymphadenopathy Cardiovascular: heart regular rate and rhythm, no murmur Lungs: clear to auscultation bilaterally Abdomen: soft, , obese nontender, nondistended, no obvious ascites, no peritoneal signs, normal bowel sounds, no organomegaly. Prior surgical incisions well-healed Rectal: Omitted clubbing cyanosis or Extremities: no lower extremity edema bilaterally Skin: no lesions on visible extremities Neuro: No focal deficits. No asterixis.   ASSESSMENT:  #1. [place] cirrhosis. Compensated #2. Status post Twinrix vaccination series #3. Normal upper endoscopy March 2017. No varices #4. Indeterminate liver lesion being followed by hematology the MRI. She has appointment with them next month #5. Colon cancer screening. Up-to-date. Previous examination 2008 with non-adenoma. Otherwise normal. Dr. Penelope Coop. No routine repeat screening plan given previous exam results, current age and current comorbidities   PLAN:  #1. Exercise and weight loss #2. Routine GI follow-up one year #3. Return to the care of your primary care provider and other specialists  20 minutes spent face-to-face with the patient. Greater than 50% a time use for counseling regarding her liver disease

## 2016-01-22 NOTE — Patient Instructions (Signed)
Please follow up in one year

## 2016-02-27 ENCOUNTER — Ambulatory Visit (HOSPITAL_COMMUNITY)
Admission: RE | Admit: 2016-02-27 | Discharge: 2016-02-27 | Disposition: A | Discharge status: Home / Self Care | Payer: Medicare Other | Source: Ambulatory Visit | Attending: Hematology | Admitting: Hematology

## 2016-02-27 ENCOUNTER — Telehealth: Payer: Self-pay | Admitting: *Deleted

## 2016-02-27 ENCOUNTER — Other Ambulatory Visit (HOSPITAL_BASED_OUTPATIENT_CLINIC_OR_DEPARTMENT_OTHER): Payer: Medicare Other

## 2016-02-27 ENCOUNTER — Telehealth: Payer: Self-pay | Admitting: Medical Oncology

## 2016-02-27 DIAGNOSIS — K760 Fatty (change of) liver, not elsewhere classified: Secondary | ICD-10-CM | POA: Insufficient documentation

## 2016-02-27 DIAGNOSIS — D696 Thrombocytopenia, unspecified: Secondary | ICD-10-CM | POA: Insufficient documentation

## 2016-02-27 DIAGNOSIS — K7689 Other specified diseases of liver: Secondary | ICD-10-CM

## 2016-02-27 LAB — CBC & DIFF AND RETIC
BASO%: 0.5 % (ref 0.0–2.0)
BASOS ABS: 0 10*3/uL (ref 0.0–0.1)
EOS ABS: 0.2 10*3/uL (ref 0.0–0.5)
EOS%: 3.7 % (ref 0.0–7.0)
HEMATOCRIT: 39.9 % (ref 34.8–46.6)
HEMOGLOBIN: 13.9 g/dL (ref 11.6–15.9)
Immature Retic Fract: 3.7 % (ref 1.60–10.00)
LYMPH%: 30.8 % (ref 14.0–49.7)
MCH: 31.1 pg (ref 25.1–34.0)
MCHC: 34.8 g/dL (ref 31.5–36.0)
MCV: 89.3 fL (ref 79.5–101.0)
MONO#: 0.4 10*3/uL (ref 0.1–0.9)
MONO%: 9 % (ref 0.0–14.0)
NEUT#: 2.4 10*3/uL (ref 1.5–6.5)
NEUT%: 56 % (ref 38.4–76.8)
Platelets: 95 10*3/uL — ABNORMAL LOW (ref 145–400)
RBC: 4.47 10*6/uL (ref 3.70–5.45)
RDW: 13.3 % (ref 11.2–14.5)
RETIC %: 1.85 % (ref 0.70–2.10)
Retic Ct Abs: 82.7 10*3/uL (ref 33.70–90.70)
WBC: 4.4 10*3/uL (ref 3.9–10.3)
lymph#: 1.3 10*3/uL (ref 0.9–3.3)

## 2016-02-27 LAB — COMPREHENSIVE METABOLIC PANEL
ALBUMIN: 3.6 g/dL (ref 3.5–5.0)
ALK PHOS: 62 U/L (ref 40–150)
ALT: 35 U/L (ref 0–55)
AST: 43 U/L — AB (ref 5–34)
Anion Gap: 8 mEq/L (ref 3–11)
BILIRUBIN TOTAL: 0.63 mg/dL (ref 0.20–1.20)
BUN: 11.3 mg/dL (ref 7.0–26.0)
CALCIUM: 8.9 mg/dL (ref 8.4–10.4)
CO2: 27 mEq/L (ref 22–29)
CREATININE: 0.7 mg/dL (ref 0.6–1.1)
Chloride: 106 mEq/L (ref 98–109)
EGFR: 83 mL/min/{1.73_m2} — ABNORMAL LOW (ref >90)
Glucose: 143 mg/dl — ABNORMAL HIGH (ref 70–140)
POTASSIUM: 4.1 meq/L (ref 3.5–5.1)
Sodium: 141 mEq/L (ref 136–145)
TOTAL PROTEIN: 7 g/dL (ref 6.4–8.3)

## 2016-02-27 MED ORDER — GADOXETATE DISODIUM 0.25 MMOL/ML IV SOLN
9.0000 mL | Freq: Once | INTRAVENOUS | Status: AC | PRN
  Administered 2016-02-27: 9 mL via INTRAVENOUS

## 2016-02-27 NOTE — Telephone Encounter (Signed)
Call report from today's MRI abdomen.  IMPRESSION: 1. Lesion in the RIGHT hepatic lobe has imaging characteristics highly suspicious for hepatic cellular carcinoma including arterial enhancement and a new peripheral rim. Lesion remains at the size limit for observation but is more prominent than comparison exam 6 months prior with new enhancing characteristics.

## 2016-02-27 NOTE — Telephone Encounter (Signed)
Call her for results of scan -transferred call to [place].

## 2016-03-01 ENCOUNTER — Other Ambulatory Visit: Payer: Self-pay

## 2016-03-01 ENCOUNTER — Telehealth: Payer: Self-pay | Admitting: Endocrinology

## 2016-03-01 ENCOUNTER — Other Ambulatory Visit: Payer: Self-pay | Admitting: Endocrinology

## 2016-03-01 MED ORDER — LEVOTHYROXINE SODIUM 50 MCG PO TABS: 50.0000 ug | ORAL_TABLET | Freq: Every day | ORAL | 3 refills | Status: DC

## 2016-03-01 NOTE — Telephone Encounter (Signed)
Patient need a refill of  levothyroxine (SYNTHROID, LEVOTHROID) 50 MCG tablet 90 tablet 3 04/21/2015    [facility] [place], [place] - [place] [phone] (Phone) [phone] (Fax)

## 2016-03-04 ENCOUNTER — Ambulatory Visit (HOSPITAL_BASED_OUTPATIENT_CLINIC_OR_DEPARTMENT_OTHER): Payer: Medicare Other | Admitting: Hematology

## 2016-03-04 ENCOUNTER — Encounter: Payer: Self-pay | Admitting: Hematology

## 2016-03-04 ENCOUNTER — Other Ambulatory Visit (HOSPITAL_BASED_OUTPATIENT_CLINIC_OR_DEPARTMENT_OTHER): Payer: Medicare Other

## 2016-03-04 ENCOUNTER — Telehealth: Payer: Self-pay | Admitting: Hematology

## 2016-03-04 VITALS — BP 146/52 | HR 70 | Temp 98.1°F | Resp 18 | Ht 64.0 in | Wt 234.9 lb

## 2016-03-04 DIAGNOSIS — D696 Thrombocytopenia, unspecified: Secondary | ICD-10-CM

## 2016-03-04 DIAGNOSIS — K769 Liver disease, unspecified: Secondary | ICD-10-CM

## 2016-03-04 DIAGNOSIS — K7460 Unspecified cirrhosis of liver: Secondary | ICD-10-CM

## 2016-03-04 DIAGNOSIS — K7581 Nonalcoholic steatohepatitis (NASH): Secondary | ICD-10-CM

## 2016-03-04 DIAGNOSIS — K7689 Other specified diseases of liver: Secondary | ICD-10-CM | POA: Diagnosis not present

## 2016-03-04 DIAGNOSIS — K7469 Other cirrhosis of liver: Secondary | ICD-10-CM

## 2016-03-04 LAB — PROTIME-INR
INR: 1.1 — AB (ref 2.00–3.50)
PROTIME: 13.2 s (ref 10.6–13.4)

## 2016-03-04 NOTE — Telephone Encounter (Signed)
Pt confirmed appt, received avs, schedule appt with [facility] on 11/7 at 9:45am with Dr. Barry Dienes

## 2016-03-04 NOTE — Progress Notes (Signed)
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 She notes she had her routine mammograms and was noted to have calcifications in the left breast. She cycle sequence and had an MRI and biopsy of the lesion that revealed a hyalinized fibroadenoma with calcifications of the left upper outer quadrant of the left breast.  She had an MRI of her liver as per scheduled on 02/27/2016 which showed slight increase in her right hepatic lesion which shows enhancement characteristics concerning for hepatocellular carcinoma. Also noted to have a very small left liver lesion. We discussed these findings in details and she is amendable to have an evaluation by Dr. Barry Dienes to help determine management of these findings.  REVIEW OF SYSTEMS:    10 Point review of systems of done and is negative except as noted above.  . Past Medical History:  Diagnosis Date  . ALLERGIC RHINITIS 01/12/2008  . Colon polyps    hyperplastic  . Depression   . Diabetes ([place])   . Fatty liver   . GOITER, MULTINODULAR 01/12/2008  . 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 Hyperlipidemia   . Hypertension   . HYPOTHYROIDISM 09/07/2008  . 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 Splenomegaly   . Thrombocytopenia ([place])    Appears chronic from at least 2014  . Vitamin D deficiency     . Past Surgical History:  Procedure Laterality Date  . arthroscopic left knee   2005  . BREAST SURGERY  2008   Breast Biopsy   . CHOLECYSTECTOMY  1985    . Social History  Substance Use Topics  . Smoking status: Former Smoker    Quit date: 05/13/1990  . Smokeless tobacco: Never Used  . Alcohol use No    ALLERGIES:  has No Known Allergies.  MEDICATIONS:  Current Outpatient Prescriptions  Medication Sig Dispense Refill  . cholecalciferol (VITAMIN D) 1000 UNITS tablet Take 2,000 Units by mouth daily.    . fluticasone (FLONASE) 50 MCG/ACT nasal spray Place into both nostrils daily.    Marland Kitchen levothyroxine (SYNTHROID, LEVOTHROID) 50 MCG tablet Take 1 tablet (50 mcg total) by mouth daily. 90 tablet 3  . lisinopril-hydrochlorothiazide (PRINZIDE,ZESTORETIC) 10-12.5 MG tablet     . metFORMIN (GLUCOPHAGE) 500 MG tablet Take 1 tablet by mouth once daily     . montelukast (SINGULAIR) 10 MG tablet     . Multiple Vitamin (MULTIVITAMIN) tablet Take 1 tablet by mouth daily.    . raloxifene (EVISTA) 60 MG tablet Take 60 mg by mouth daily.     No current facility-administered medications for this visit.     PHYSICAL EXAMINATION: ECOG PERFORMANCE STATUS: 1 - Symptomatic but completely ambulatory  . Vitals:   03/04/16 0934  BP: (!) 146/52  Pulse: 70  Resp: 18  Temp: 98.1 F (36.7 C)    Filed Weights   03/04/16 0934  Weight: 234 lb 14.4 oz (106.5 kg)   .Body mass index is  40.32 kg/m.  GENERAL:alert, in no acute distress and comfortable SKIN: skin color, texture, turgor are normal, no rashes or significant lesions EYES: normal, conjunctiva are pink and non-injected, sclera clear OROPHARYNX:no exudate, no erythema and lips, buccal mucosa, and tongue normal  NECK: supple, no JVD, thyroid normal size, non-tender, without nodularity LYMPH:  no palpable lymphadenopathy in the cervical, axillary or inguinal LUNGS: clear to auscultation with normal respiratory effort HEART: regular rate & rhythm,  no murmurs and no lower extremity edema ABDOMEN: abdomen soft, non-tender,  normoactive bowel sounds ,No palpable hepatosplenomegaly Musculoskeletal: no cyanosis of digits and no clubbing  PSYCH: alert & oriented x 3 with fluent speech NEURO: no focal motor/sensory deficits  LABORATORY DATA:   I have reviewed the data as listed  . CBC Latest Ref Rng & Units 02/27/2016 08/28/2015 08/17/2015  WBC 3.9 - 10.3 10e3/uL 4.4 3.8(L) 3.2(L)  Hemoglobin 11.6 - 15.9 g/dL 13.9 13.9 13.1  Hematocrit 34.8 - 46.6 % 39.9 40.3 37.9  Platelets 145 - 400 10e3/uL 95(L) 108(L) 89(L)    . CMP Latest Ref Rng & Units 02/27/2016 08/28/2015 08/18/2015  Glucose 70 - 140 mg/dl 143(H) 107 -  BUN 7.0 - 26.0 mg/dL 11.3 11.0 -  Creatinine 0.6 - 1.1 mg/dL 0.7 0.7 0.60  Sodium 136 - 145 mEq/L 141 140 -  Potassium 3.5 - 5.1 mEq/L 4.1 3.8 -  Chloride 96 - 112 mEq/L - - -  CO2 22 - 29 mEq/L 27 25 -  Calcium 8.4 - 10.4 mg/dL 8.9 9.2 -  Total Protein 6.4 - 8.3 g/dL 7.0 6.9 -  Total Bilirubin 0.20 - 1.20 mg/dL 0.63 0.79 -  Alkaline Phos 40 - 150 U/L 62 48 -  AST 5 - 34 U/L 43(H) 44(H) -  ALT 0 - 55 U/L 35 32 -    RADIOLOGY  .Mr Liver W Wo Contrast  Result Date: 02/27/2016 CLINICAL DATA:  Cirrhotic liver with enhancing lesion. EXAM: MRI ABDOMEN WITHOUT AND WITH CONTRAST TECHNIQUE: Multiplanar multisequence MR imaging of the abdomen was performed both before and after the administration of intravenous contrast. CONTRAST:  9 mL Eovist COMPARISON:  MRI 08/18/2015 FINDINGS: Lower chest:  Lung bases are clear. Hepatobiliary: There is diffuse loss of signal intensity within the liver on opposed phase imaging (series 4) consistent with mild hepatic steatosis. Liver has a nodular contour with enlarged caudate lobe. There is mild linear high T2 signal within liver. In the RIGHT hepatic lobe, 9 mm lesion (image 12, series 7) is hyperintense on T2 weighted imaging similar to comparison exam. Lesion is isointense on the T1 precontrast imaging again demonstrates uniform enhancement on arterial phase imaging.  Lesion measures 13 x 10 mm (image 19, series 501) compared to 9 mm by 7 mm. Lesion has washout on more delayed sequence with a peripheral halo (image 24, series 502). On 20 minutes delayed imaging lesion again does not accumulate the hepatocytes specific agent and measures 9 x 8 mm on delayed imaging compared to 9 x 7 mm. Small 5 mm lesion LEFT hepatic lobe (image 47, series 2) also does not accumulate Eovist. This lesion is hyperintense on T2 weighted imaging (image 30, series 7). Lesion not evident on contrast series. Pancreas: Normal pancreatic parenchymal intensity. No ductal dilatation or inflammation. Spleen: Normal spleen. Adrenals/urinary tract: Adrenal glands and kidneys are normal. Stomach/Bowel: Stomach and limited of the small bowel is unremarkable Vascular/Lymphatic: Abdominal aortic normal caliber. No retroperitoneal periportal lymphadenopathy. Musculoskeletal: No aggressive osseous lesion IMPRESSION: 1. Lesion in the  RIGHT hepatic lobe has imaging characteristics highly suspicious for hepatic cellular carcinoma including arterial enhancement and a new peripheral rim. Lesion remains at the size limit for observation but is more prominent than comparison exam 6 months prior with new enhancing characteristics. 2. Second similar lesion in the LEFT hepatic lobe is very small. Recommend close attention on follow-up to exclude multifocal disease. 3. Hepatic steatosis. These results will be called to the ordering clinician or representative by the Radiologist Assistant, and communication documented in the PACS or zVision Dashboard. Electronically Signed   By: Suzy Bouchard M.D.   On: 02/27/2016 14:42     MRI ABDOMEN WITHOUT AND WITH CONTRAST 08/18/2015  TECHNIQUE: Multiplanar multisequence MR imaging of the abdomen was performed both before and after the administration of intravenous contrast.  CONTRAST: 10 mL Eovist IV  COMPARISON: 05/04/2015  FINDINGS: Lower chest: Lung bases are  clear.  Hepatobiliary: Macronodular cirrhosis. Mild hepatic steatosis. Subtle T2 hyperintense lesion in segment 7 (series 11/ image 18), with early arterial enhancement (series 601/image 30) and delayed washout (series 13/ image 30), measuring 9 x 7 mm on the delayed Eovist sequence. Given size less than 10 mm, this does not meet AASLD imaging criteria for diagnosis of hepatocellular carcinoma, and a dysplastic nodule remains possible.  Status post cholecystectomy. No intrahepatic or extrahepatic ductal dilatation.  Pancreas: Within normal limits.  Spleen: Splenomegaly, measuring 14.5 cm in maximal craniocaudal dimension.  Adrenals/Urinary Tract: Adrenal glands are within normal limits.  Kidneys are within normal limits. No hydronephrosis.  Stomach/Bowel: Stomach is within normal limits.  Visualized bowel is unremarkable.  Vascular/Lymphatic: No evidence of abdominal aortic aneurysm.  Portal vein is patent.  No suspicious abdominal lymphadenopathy.  Other: Trace/mild abdominal ascites with mesenteric fluid.  Musculoskeletal: No focal osseous lesions.  IMPRESSION: Cirrhosis with mild hepatic steatosis.  9 x 7 mm enhancing lesion in hepatic segment 7, possibly reflecting a dysplastic nodule versus small HCC. Given size less than 10 mm, this does not meet AASLD imaging criteria for diagnosis of hepatocellular carcinoma. Consider follow-up MR abdomen with/without contrast in 6-12 months, as clinically warranted.   Electronically Signed  By: Julian Hy M.D.  On: 08/18/2015 10:21   ASSESSMENT & PLAN:   #1 Chronic thrombocytopenia. Platelet counts today are relatively stable improved to 108k from 89k Likely related to cirrhosis related to NASH with mild splenomegaly.  B12, TSH, LDH within normal limits. 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 Recent  EGD showed no evidence of varices. Subtle changes of portal hypertensive gastropathy. Plan -Patient's platelet count is stable. No other intervention indicated at this time. -Continue liver related cares with Dr. Henrene Pastor --liver cirrhosis mx per GI and PCP   #3  Lesion in the RIGHT hepatic lobe has imaging characteristics highly suspicious for hepatic cellular carcinoma including arterial enhancement and a new peripheral rim. Second similar lesion in the LEFT hepatic lobe is very small. Plan --AFP tumor marker done today pending. -Given referral to Dr. Barry Dienes for her input regarding- close monitoring of liver lesion with imaging vs  liver lesion biopsy with concurrent ablation by interventional radiology versus resection of liver lesion .  Return to care with Dr. Irene Limbo in 3 months with labs. Earlier if any other new concerns  Continue follow-up with primary care physician.  I spent 15 minutes counseling the patient face to face. The total time spent in the appointment was 20 minutes and more than  50% was on counseling and direct patient cares.    Sullivan Lone MD [place] AAHIVMS [facility] Hematology/Oncology Physician [facility]  (Office):       [phone] (Work cell):  [phone] (Fax):           [phone]

## 2016-03-05 LAB — APTT: APTT: 26 s (ref 24–33)

## 2016-03-05 LAB — AFP TUMOR MARKER: AFP, Serum, Tumor Marker: 7.4 ng/mL (ref 0.0–8.3)

## 2016-03-05 LAB — ALPHA FETO PROTEIN (PARALLEL TESTING): AFP TUMOR MARKER: 8.2 ng/mL — AB (ref <6.1)

## 2016-03-19 DIAGNOSIS — R160 Hepatomegaly, not elsewhere classified: Secondary | ICD-10-CM | POA: Diagnosis not present

## 2016-03-21 ENCOUNTER — Other Ambulatory Visit: Payer: Self-pay | Admitting: General Surgery

## 2016-03-21 DIAGNOSIS — K769 Liver disease, unspecified: Secondary | ICD-10-CM

## 2016-03-28 DIAGNOSIS — Z961 Presence of intraocular lens: Secondary | ICD-10-CM | POA: Diagnosis not present

## 2016-03-28 DIAGNOSIS — H524 Presbyopia: Secondary | ICD-10-CM | POA: Diagnosis not present

## 2016-03-28 DIAGNOSIS — E119 Type 2 diabetes mellitus without complications: Secondary | ICD-10-CM | POA: Diagnosis not present

## 2016-04-01 DIAGNOSIS — Z1289 Encounter for screening for malignant neoplasm of other sites: Secondary | ICD-10-CM | POA: Diagnosis not present

## 2016-04-01 DIAGNOSIS — Z6841 Body Mass Index (BMI) 40.0 and over, adult: Secondary | ICD-10-CM

## 2016-04-02 ENCOUNTER — Ambulatory Visit
Admission: RE | Admit: 2016-04-02 | Discharge: 2016-04-02 | Disposition: A | Discharge status: Home / Self Care | Payer: Medicare Other | Source: Ambulatory Visit | Attending: General Surgery | Admitting: General Surgery

## 2016-04-02 DIAGNOSIS — K769 Liver disease, unspecified: Secondary | ICD-10-CM

## 2016-04-02 DIAGNOSIS — K7581 Nonalcoholic steatohepatitis (NASH): Secondary | ICD-10-CM | POA: Diagnosis not present

## 2016-04-02 HISTORY — PX: IR GENERIC HISTORICAL: IMG1180011

## 2016-04-02 NOTE — Consult Note (Signed)
Chief Complaint: Patient was seen in consultation today for NASH cirrhosis with liver lesions at the request of [facility]  Referring Physician(s): Byerly,Faera  History of Present Illness: Crystal Barnett is a 75 y.o. female with a history of [place] cirrhosis consultative by thrombocytopenia. MRI imaging was performed first in December 2016 to evaluate a region of possible concern in the left hepatic lobe. On that study, there was a small 9 mm enhancing focus in hepatic segment 7 in the posterosuperior aspect of the hepatic dome. This subsequently been followed at 6 month intervals with repeat MRI scans of the abdomen. On the most recent scan which was performed on 02/27/2016, the lesion measures 10 mm compared to 9 mm nearly one year previously. However, the lesion does appear slightly more prominent and demonstrates enhancement characteristics which are concerning for hepatocellular carcinoma.  In addition, there is a tiny 5 mm lesion in the left hepatic lobe. This can be seen on prior studies in retrospect and remains unchanged. This lesion is too small for precise determination but is fairly well-circumscribed and may represent a tiny cyst.  Clinically, Crystal Barnett is asymptomatic. She has no active complaints at this time. She recently saw Dr. Barry Dienes who kindly sent her to see me to discuss possible biopsy and ablation.  Past Medical History:  Diagnosis Date  . ALLERGIC RHINITIS 01/12/2008  . Colon polyps    hyperplastic  . Depression   . Diabetes ([place])   . Fatty liver   . GOITER, MULTINODULAR 01/12/2008  . 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 Hyperlipidemia   . Hypertension   . HYPOTHYROIDISM 09/07/2008  . 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 Splenomegaly   . Thrombocytopenia ([place])    Appears chronic from at least 2014  . Vitamin D deficiency     Past Surgical History:  Procedure Laterality Date  . arthroscopic left knee  2005  .  BREAST SURGERY  2008   Breast Biopsy   . CHOLECYSTECTOMY  1985    Allergies: Patient has no known allergies.  Medications: Prior to Admission medications   Medication Sig Start Date End Date Taking? Authorizing Provider  cholecalciferol (VITAMIN D) 1000 UNITS tablet Take 2,000 Units by mouth daily.   Yes Historical Provider, MD  fluticasone (FLONASE) 50 MCG/ACT nasal spray Place into both nostrils daily.   Yes Historical Provider, MD  levothyroxine (SYNTHROID, LEVOTHROID) 50 MCG tablet Take 1 tablet (50 mcg total) by mouth daily. 03/01/16  Yes Renato Shin, MD  lisinopril-hydrochlorothiazide (PRINZIDE,ZESTORETIC) 10-12.5 MG tablet  03/14/15  Yes Historical Provider, MD  metFORMIN (GLUCOPHAGE) 500 MG tablet Take 1 tablet by mouth once daily    Yes Historical Provider, MD  montelukast (SINGULAIR) 10 MG tablet  03/25/14  Yes Historical Provider, MD  Multiple Vitamin (MULTIVITAMIN) tablet Take 1 tablet by mouth daily.   Yes Historical Provider, MD     Family History  Problem Relation Age of Onset  . Goiter Maternal Grandmother   . Stroke Father 16    Social History   Social History  . Marital status: Single    Spouse name: N/A  . Number of children: N/A  . Years of education: N/A   Occupational History  . 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 Smoking status: Former Smoker    Quit date: 05/13/1990  .  Smokeless tobacco: Never Used  . Alcohol use No  . Drug use: No  . Sexual activity: Not Asked   Other Topics Concern  . None   Social History Narrative  . None    ECOG Status: 0 - Asymptomatic  Review of Systems: A 12 point ROS discussed and pertinent positives are indicated in the HPI above.  All other systems are negative.  Review of Systems  Vital Signs: BP (!) 129/56   Pulse 73   Temp 97.8 F (36.6 C)   Resp 18   SpO2 95%   Physical Exam  Constitutional: She is oriented to person, place, and  time. She appears well-developed and well-nourished. No distress.  HENT:  Head: Normocephalic and atraumatic.  Eyes: No scleral icterus.  Cardiovascular: Normal rate and regular rhythm.   Pulmonary/Chest: Effort normal.  Abdominal: Soft. She exhibits no distension. There is no tenderness.  Neurological: She is alert and oriented to person, place, and time.  Skin: Skin is warm and dry.  Psychiatric: She has a normal mood and affect. Her behavior is normal.  Vitals reviewed.   Mallampati Score:     Imaging: No results found.  Labs:  CBC:  Recent Labs  06/09/15 0922 08/17/15 1014 08/28/15 0857 02/27/16 0855  WBC 4.3 3.2* 3.8* 4.4  HGB 13.8 13.1 13.9 13.9  HCT 40.6 37.9 40.3 39.9  PLT 101.0* 89* 108* 95*    COAGS:  Recent Labs  03/04/16 1121  INR 1.10*    BMP:  Recent Labs  06/09/15 0922 08/17/15 1014 08/18/15 0934 08/28/15 0857 02/27/16 0855  NA 141 142  --  140 141  K 3.9 4.0  --  3.8 4.1  CL 105  --   --   --   --   CO2 30 29  --  25 27  GLUCOSE 125* 85  --  107 143*  BUN 9 9.5  --  11.0 11.3  CALCIUM 9.4 8.8  --  9.2 8.9  CREATININE 0.63 0.7 0.60 0.7 0.7    LIVER FUNCTION TESTS:  Recent Labs  06/09/15 0922 08/17/15 1014 08/28/15 0857 02/27/16 0855  BILITOT 0.5 0.44 0.79 0.63  AST 39* 32 44* 43*  ALT 32 27 32 35  ALKPHOS 56 56 48 62  PROT 6.5 6.4 6.9 7.0  ALBUMIN 3.8 3.4* 3.6 3.6    TUMOR MARKERS:  Recent Labs  04/19/15 1111 08/28/15 0857 03/04/16 1117  AFPTM 6.2* 6.3* 8.2*    Assessment and Plan:  Pleasant 75 year old female with NASH cirrhosis and a concerning 1 cm lesion posterior aspect of the right hepatic dome.  Fortunately, the lesion demonstrates very little change between December 2016 and October 2017. Minimal change over 1 year suggests either a dysplastic nodules, or a well differentiated HCC.  Unfortunately, the lesion is high and posterior in the right hepatic dome in the portion of the liver partially surrounded  by a long period this can be a very difficult location to visualize sonographically. Additionally, given the very small size of the lesion, CT-guided localization would also be challenging.  The smaller lesion in the left hepatic lobe is also unchanged over prior studies. While this is too small for precise characterization, I am favoring this to represent a small benign cyst. Continued attention on follow-up imaging is certainly prudent.  I brought Mrs. Paternoster into one of our sonography suites and did a detailed bedside ultrasound evaluation of the liver. I can see no definite 1 cm lesion in the  hepatic dome. I was pleasantly surprised by the quality of visualization of her liver tissues. I also evaluated the left hepatic lobe. The tiny 5 mm lesion is anechoic by ultrasound and again favors a small cyst. This is reassuring.  Since the lesion cannot be visualized sonographically (an ultrasound guided biopsy would allow [place] to be certain we were in the lesion location in the event of a negative biopsy) are only other option for biopsy would be CT guidance which is less precise given a lesion that would likely be occult by CT imaging. If we were to undergo a CT-guided biopsy and returned a negative result, I don't know how reassured I would be that the biopsy wasn't simply a sampling error.  In light of this, I currently favor repeat MRI imaging in 6 months. Given the minimal changes over the past 10 months, it would be extremely unlikely for the lesion to change dramatically over this time interval. However, if it does enlarge I think I would be more likely to see if sonographically which would facilitate biopsy, and ultimately treatment with percutaneous ablation if this is a hepatocellular cancer.  1.) Repeat MRI of the abdomen with contrast to be performed at [facility] in 6 months with a follow-up clinic visit. We will reassess the need for biopsy at that time.    Thank you for this  interesting consult.  I greatly enjoyed meeting CAPTOLA TESCHNER and look forward to participating in their care.  A copy of this report was sent to the requesting provider on this date.  Electronically Signed: Jacqulynn Cadet 04/02/2016, 11:13 AM   I spent a total of  40 Minutes  in face to face in clinical consultation, greater than 50% of which was counseling/coordinating care for liver lesion.

## 2016-04-19 ENCOUNTER — Ambulatory Visit: Payer: Medicare Other | Admitting: Endocrinology

## 2016-05-01 ENCOUNTER — Encounter: Payer: Self-pay | Admitting: Interventional Radiology

## 2016-05-22 ENCOUNTER — Encounter: Payer: Self-pay | Admitting: Internal Medicine

## 2016-05-28 DIAGNOSIS — J029 Acute pharyngitis, unspecified: Secondary | ICD-10-CM | POA: Diagnosis not present

## 2016-06-03 ENCOUNTER — Encounter: Payer: Self-pay | Admitting: Hematology

## 2016-06-03 ENCOUNTER — Ambulatory Visit (HOSPITAL_BASED_OUTPATIENT_CLINIC_OR_DEPARTMENT_OTHER): Payer: Medicare Other | Admitting: Hematology

## 2016-06-03 ENCOUNTER — Other Ambulatory Visit (HOSPITAL_BASED_OUTPATIENT_CLINIC_OR_DEPARTMENT_OTHER): Payer: Medicare Other

## 2016-06-03 ENCOUNTER — Telehealth: Payer: Self-pay | Admitting: Hematology

## 2016-06-03 VITALS — BP 137/67 | HR 69 | Temp 98.6°F | Resp 18 | Ht 64.0 in | Wt 237.6 lb

## 2016-06-03 DIAGNOSIS — D696 Thrombocytopenia, unspecified: Secondary | ICD-10-CM

## 2016-06-03 DIAGNOSIS — C228 Malignant neoplasm of liver, primary, unspecified as to type: Secondary | ICD-10-CM

## 2016-06-03 DIAGNOSIS — K769 Liver disease, unspecified: Secondary | ICD-10-CM

## 2016-06-03 DIAGNOSIS — D72819 Decreased white blood cell count, unspecified: Secondary | ICD-10-CM

## 2016-06-03 DIAGNOSIS — K7469 Other cirrhosis of liver: Secondary | ICD-10-CM

## 2016-06-03 DIAGNOSIS — K7581 Nonalcoholic steatohepatitis (NASH): Secondary | ICD-10-CM | POA: Diagnosis not present

## 2016-06-03 DIAGNOSIS — K7689 Other specified diseases of liver: Secondary | ICD-10-CM | POA: Diagnosis not present

## 2016-06-03 DIAGNOSIS — K7460 Unspecified cirrhosis of liver: Secondary | ICD-10-CM

## 2016-06-03 LAB — COMPREHENSIVE METABOLIC PANEL
ALBUMIN: 3.5 g/dL (ref 3.5–5.0)
ALK PHOS: 65 U/L (ref 40–150)
ALT: 30 U/L (ref 0–55)
ANION GAP: 8 meq/L (ref 3–11)
AST: 33 U/L (ref 5–34)
BUN: 9.1 mg/dL (ref 7.0–26.0)
CALCIUM: 9.2 mg/dL (ref 8.4–10.4)
CHLORIDE: 105 meq/L (ref 98–109)
CO2: 25 mEq/L (ref 22–29)
CREATININE: 0.7 mg/dL (ref 0.6–1.1)
EGFR: 85 mL/min/{1.73_m2} — ABNORMAL LOW (ref >90)
Glucose: 173 mg/dl — ABNORMAL HIGH (ref 70–140)
Potassium: 3.7 mEq/L (ref 3.5–5.1)
Sodium: 139 mEq/L (ref 136–145)
Total Bilirubin: 0.7 mg/dL (ref 0.20–1.20)
Total Protein: 6.4 g/dL (ref 6.4–8.3)

## 2016-06-03 LAB — CBC & DIFF AND RETIC
BASO%: 0.3 % (ref 0.0–2.0)
BASOS ABS: 0 10*3/uL (ref 0.0–0.1)
EOS%: 2.3 % (ref 0.0–7.0)
Eosinophils Absolute: 0.1 10*3/uL (ref 0.0–0.5)
HEMATOCRIT: 38.1 % (ref 34.8–46.6)
HEMOGLOBIN: 13.1 g/dL (ref 11.6–15.9)
Immature Retic Fract: 8.5 % (ref 1.60–10.00)
LYMPH%: 30 % (ref 14.0–49.7)
MCH: 31 pg (ref 25.1–34.0)
MCHC: 34.4 g/dL (ref 31.5–36.0)
MCV: 90.1 fL (ref 79.5–101.0)
MONO#: 0.3 10*3/uL (ref 0.1–0.9)
MONO%: 9.5 % (ref 0.0–14.0)
NEUT#: 2 10*3/uL (ref 1.5–6.5)
NEUT%: 57.9 % (ref 38.4–76.8)
Platelets: 80 10*3/uL — ABNORMAL LOW (ref 145–400)
RBC: 4.23 10*6/uL (ref 3.70–5.45)
RDW: 13.4 % (ref 11.2–14.5)
RETIC CT ABS: 85.45 10*3/uL (ref 33.70–90.70)
Retic %: 2.02 % (ref 0.70–2.10)
WBC: 3.5 10*3/uL — ABNORMAL LOW (ref 3.9–10.3)
lymph#: 1 10*3/uL (ref 0.9–3.3)

## 2016-06-03 LAB — PROTIME-INR
INR: 1 — AB (ref 2.00–3.50)
PROTIME: 12 s (ref 10.6–13.4)

## 2016-06-03 NOTE — Telephone Encounter (Signed)
Appointments scheduled per 06/03/16 los. Patient was given a copy of the updated appointment schedule and AVS report per 06/03/16 los.

## 2016-06-03 NOTE — Progress Notes (Signed)
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 Laurence Ferrari)  Current Treatment: observation and further evaluation  INTERVAL HISTORY: Patient is here for follow-up regarding her thrombocytopenia and follow-up regarding her concerning liver lesion. Since our last clinic visit patient has been evaluated by Dr Barry Dienes and was referred to interventional radiology Dr. Laurence Ferrari . She has been scheduled for an MRI of the liver sometime late April early May 2018 for evaluation of the liver nodule and to consider biopsy/concurrent ablation of her liver lesion. Her last a few tumor Marker were borderline elevated. No issues with bleeding or excessive bruising. Had a recent mild viral URI which is now resolved. No other acute new symptoms.    REVIEW OF SYSTEMS:    10 Point review of systems of done and is negative except as noted above.  . Past Medical History:  Diagnosis Date  . ALLERGIC RHINITIS 01/12/2008  . Colon polyps    hyperplastic  . Depression   . Diabetes ([place])   . Fatty liver   . GOITER, MULTINODULAR 01/12/2008  . 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 Hyperlipidemia   . Hypertension   . HYPOTHYROIDISM 09/07/2008  . 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 Splenomegaly   . Thrombocytopenia ([place])    Appears chronic from at least 2014  . Vitamin D deficiency     . Past Surgical History:  Procedure Laterality Date  . arthroscopic left knee  2005  . BREAST SURGERY  2008   Breast Biopsy   . CHOLECYSTECTOMY  1985  . IR GENERIC HISTORICAL  04/02/2016    IR RADIOLOGIST EVAL & MGMT 04/02/2016 Jacqulynn Cadet, MD [facility] INTERV RAD    . Social History  Substance Use Topics  . Smoking status: Former Smoker    Quit date: 05/13/1990  . Smokeless tobacco: Never Used  . Alcohol use No    ALLERGIES:  has No Known Allergies.  MEDICATIONS:  Current Outpatient Prescriptions  Medication Sig Dispense Refill  . cholecalciferol (VITAMIN D) 1000 UNITS tablet Take 2,000 Units by mouth daily.    . fluticasone (FLONASE) 50 MCG/ACT nasal spray Place into both nostrils daily.    Marland Kitchen levothyroxine (SYNTHROID, LEVOTHROID) 50 MCG tablet Take 1 tablet (50 mcg total) by mouth daily. 90 tablet 3  . lisinopril-hydrochlorothiazide (PRINZIDE,ZESTORETIC) 10-12.5 MG tablet     . metFORMIN (GLUCOPHAGE) 500 MG tablet Take 1 tablet by mouth once daily     . montelukast (SINGULAIR) 10 MG tablet     . Multiple Vitamin (MULTIVITAMIN) tablet Take 1 tablet by mouth daily.     No current facility-administered medications for this visit.     PHYSICAL EXAMINATION: ECOG PERFORMANCE STATUS: 1 - Symptomatic but completely ambulatory  . Vitals:   06/03/16 0947  BP: 137/67  Pulse: 69  Resp: 18  Temp: 98.6 F (37 C)    Filed Weights   06/03/16 0947  Weight: 237 lb 9.6 oz (107.8 kg)   .Body mass index is 40.78 kg/m.  GENERAL:alert, in no acute distress and comfortable SKIN: skin  color, texture, turgor are normal, no rashes or significant lesions EYES: normal, conjunctiva are pink and non-injected, sclera clear OROPHARYNX:no exudate, no erythema and lips, buccal mucosa, and tongue normal  NECK: supple, no JVD, thyroid normal size, non-tender, without nodularity LYMPH:  no palpable lymphadenopathy in the cervical, axillary or inguinal LUNGS: clear to auscultation with normal respiratory effort HEART: regular rate & rhythm,  no murmurs and no lower extremity edema ABDOMEN: abdomen soft, non-tender, normoactive bowel sounds ,No palpable  hepatosplenomegaly Musculoskeletal: no cyanosis of digits and no clubbing  PSYCH: alert & oriented x 3 with fluent speech NEURO: no focal motor/sensory deficits  LABORATORY DATA:   I have reviewed the data as listed  . CBC Latest Ref Rng & Units 06/03/2016 02/27/2016 08/28/2015  WBC 3.9 - 10.3 10e3/uL 3.5(L) 4.4 3.8(L)  Hemoglobin 11.6 - 15.9 g/dL 13.1 13.9 13.9  Hematocrit 34.8 - 46.6 % 38.1 39.9 40.3  Platelets 145 - 400 10e3/uL 80(L) 95(L) 108(L)    . CMP Latest Ref Rng & Units 06/03/2016 02/27/2016 08/28/2015  Glucose 70 - 140 mg/dl 173(H) 143(H) 107  BUN 7.0 - 26.0 mg/dL 9.1 11.3 11.0  Creatinine 0.6 - 1.1 mg/dL 0.7 0.7 0.7  Sodium 136 - 145 mEq/L 139 141 140  Potassium 3.5 - 5.1 mEq/L 3.7 4.1 3.8  Chloride 96 - 112 mEq/L - - -  CO2 22 - 29 mEq/L 25 27 25   Calcium 8.4 - 10.4 mg/dL 9.2 8.9 9.2  Total Protein 6.4 - 8.3 g/dL 6.4 7.0 6.9  Total Bilirubin 0.20 - 1.20 mg/dL 0.70 0.63 0.79  Alkaline Phos 40 - 150 U/L 65 62 48  AST 5 - 34 U/L 33 43(H) 44(H)  ALT 0 - 55 U/L 30 35 32      RADIOLOGY  MRI Liver 02/27/2016 IMPRESSION: 1. Lesion in the RIGHT hepatic lobe has imaging characteristics highly suspicious for hepatic cellular carcinoma including arterial enhancement and a new peripheral rim. Lesion remains at the size limit for observation but is more prominent than comparison exam 6 months prior with new enhancing characteristics. 2. Second similar lesion in the LEFT hepatic lobe is very small. Recommend close attention on follow-up to exclude multifocal disease. 3. Hepatic steatosis. These results will be called to the ordering clinician or representative by the Radiologist Assistant, and communication documented in the PACS or zVision Dashboard.   Electronically Signed   By: Suzy Bouchard M.D.   On: 02/27/2016 14:42   ASSESSMENT & PLAN:   #1 Chronic thrombocytopenia. Platelet counts today are relatively stable improved to 108k from 89k Likely  related to cirrhosis related to NASH with mild splenomegaly.  B12, TSH, LDH within normal limits. 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 Recent EGD showed no evidence of varices. Subtle changes of portal hypertensive gastropathy. Plan -Patient's platelet count is stable (slightly low at 80k in setting of recent viral URI). No other intervention indicated at this time. -Continue liver related cares with Dr. Henrene Pastor --liver cirrhosis mx per GI and PCP   #3  Lesion in the RIGHT hepatic lobe has imaging characteristics highly suspicious for hepatic cellular carcinoma including arterial enhancement and a new peripheral rim. Second similar lesion in the LEFT hepatic lobe is very small.  Plan --AFP tumor marker done today pending. -evaluated by Dr. Barry Dienes and referred to Dr Laurence Ferrari (IR).- She will be set up for a repeat MRI of the liver and frequent early  May 2018 with consideration of concurrent biopsy of her liver lesion and ablation .  Return to care with Dr. Irene Limbo in 6 months with labs. Earlier if any other new concerns  Continue follow-up with primary care physician.  I spent 15 minutes counseling the patient face to face. The total time spent in the appointment was 20 minutes and more than 50% was on counseling and direct patient cares.    Sullivan Lone MD [place] AAHIVMS [facility] Hematology/Oncology Physician [facility]  (Office):       [phone] (Work cell):  [phone] (Fax):           [phone]

## 2016-06-04 LAB — AFP TUMOR MARKER: AFP, Serum, Tumor Marker: 7.1 ng/mL (ref 0.0–8.3)

## 2016-06-20 ENCOUNTER — Ambulatory Visit (INDEPENDENT_AMBULATORY_CARE_PROVIDER_SITE_OTHER): Payer: Medicare Other | Admitting: Endocrinology

## 2016-06-20 ENCOUNTER — Encounter: Payer: Self-pay | Admitting: Endocrinology

## 2016-06-20 VITALS — BP 118/70 | HR 72 | Wt 232.4 lb

## 2016-06-20 DIAGNOSIS — E039 Hypothyroidism, unspecified: Secondary | ICD-10-CM | POA: Diagnosis not present

## 2016-06-20 LAB — TSH: TSH: 0.65 u[IU]/mL (ref 0.35–4.50)

## 2016-06-20 NOTE — Progress Notes (Signed)
Pre visit review using our clinic review tool, if applicable. No additional management support is needed unless otherwise documented below in the visit note. 

## 2016-06-20 NOTE — Progress Notes (Signed)
Subjective:    Patient ID: Crystal Barnett, female    DOB: 13-Nov-1940, 76 y.o.   MRN: [id number]  HPI pt returns for f/u of multinodular goiter (dx'ed 2009, when bilat bxs were benign in 2009; the cytology did not show chronic thyroiditis, but she has chronic hypothyroidism; she has been on synthroid since 2009).  She notices the goiter, but says there has been no change.  Past Medical History:  Diagnosis Date  . ALLERGIC RHINITIS 01/12/2008  . Colon polyps    hyperplastic  . Depression   . Diabetes ([place])   . Fatty liver   . GOITER, MULTINODULAR 01/12/2008  . 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 Hyperlipidemia   . Hypertension   . HYPOTHYROIDISM 09/07/2008  . 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 Splenomegaly   . Thrombocytopenia ([place])    Appears chronic from at least 2014  . Vitamin D deficiency     Past Surgical History:  Procedure Laterality Date  . arthroscopic left knee  2005  . BREAST SURGERY  2008   Breast Biopsy   . CHOLECYSTECTOMY  1985  . IR GENERIC HISTORICAL  04/02/2016   IR RADIOLOGIST EVAL & MGMT 04/02/2016 Jacqulynn Cadet, MD [facility] INTERV RAD    Social History   Social History  . Marital status: Single    Spouse name: N/A  . Number of children: N/A  . Years of education: N/A   Occupational History  . 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 Smoking status: Former Smoker    Quit date: 05/13/1990  . Smokeless tobacco: Never Used  . Alcohol use No  . Drug use: No  . Sexual activity: Not on file   Other Topics Concern  . Not on file   Social History Narrative  . No narrative on file    Current Outpatient Prescriptions on File Prior to Visit  Medication Sig Dispense Refill  . cholecalciferol (VITAMIN D) 1000 UNITS tablet Take 2,000 Units by mouth daily.    . fluticasone (FLONASE) 50 MCG/ACT nasal spray Place into both nostrils daily.    Marland Kitchen  levothyroxine (SYNTHROID, LEVOTHROID) 50 MCG tablet Take 1 tablet (50 mcg total) by mouth daily. 90 tablet 3  . lisinopril-hydrochlorothiazide (PRINZIDE,ZESTORETIC) 10-12.5 MG tablet     . metFORMIN (GLUCOPHAGE) 500 MG tablet Take 1 tablet by mouth once daily     . montelukast (SINGULAIR) 10 MG tablet     . Multiple Vitamin (MULTIVITAMIN) tablet Take 1 tablet by mouth daily.     No current facility-administered medications on file prior to visit.     No Known Allergies  Family History  Problem Relation Age of Onset  . Goiter Maternal Grandmother   . Stroke Father 80    BP 118/70 (BP Location: Left Arm, Patient Position: Standing, Cuff Size: Large)   Pulse 72   Wt 232 lb 6.4 oz (105.4 kg)   SpO2 95%   BMI 39.89 kg/m    Review of Systems Denies sob    Objective:   Physical Exam VITAL SIGNS:  See vs page GENERAL: no distress Neck: large bilat multinodular goiter (R>L), is again noted.   Lab Results  Component Value Date   TSH 0.65 06/20/2016      Assessment & Plan:  Chronic hypothyroidism: well-replaced Goiter: clinically unchanged  Patient is advised the  following: Patient Instructions  You don't need to recheck the ultrasound now. A thyroid blood test is requested for you today.  We'll let you know about the results. Please continue the same thyroid pill.  Please return in 1 year.

## 2016-06-20 NOTE — Patient Instructions (Addendum)
You don't need to recheck the ultrasound now. A thyroid blood test is requested for you today.  We'll let you know about the results. Please continue the same thyroid pill.  Please return in 1 year.

## 2016-08-30 DIAGNOSIS — R160 Hepatomegaly, not elsewhere classified: Secondary | ICD-10-CM | POA: Diagnosis not present

## 2016-09-06 DIAGNOSIS — E7800 Pure hypercholesterolemia, unspecified: Secondary | ICD-10-CM | POA: Diagnosis not present

## 2016-09-06 DIAGNOSIS — R160 Hepatomegaly, not elsewhere classified: Secondary | ICD-10-CM | POA: Diagnosis not present

## 2016-09-06 DIAGNOSIS — Z7984 Long term (current) use of oral hypoglycemic drugs: Secondary | ICD-10-CM | POA: Diagnosis not present

## 2016-09-06 DIAGNOSIS — I10 Essential (primary) hypertension: Secondary | ICD-10-CM | POA: Diagnosis not present

## 2016-09-06 DIAGNOSIS — E039 Hypothyroidism, unspecified: Secondary | ICD-10-CM | POA: Diagnosis not present

## 2016-09-06 DIAGNOSIS — K7581 Nonalcoholic steatohepatitis (NASH): Secondary | ICD-10-CM | POA: Diagnosis not present

## 2016-09-06 DIAGNOSIS — Z6839 Body mass index (BMI) 39.0-39.9, adult: Secondary | ICD-10-CM | POA: Diagnosis not present

## 2016-09-06 DIAGNOSIS — E119 Type 2 diabetes mellitus without complications: Secondary | ICD-10-CM | POA: Diagnosis not present

## 2016-09-06 DIAGNOSIS — E559 Vitamin D deficiency, unspecified: Secondary | ICD-10-CM | POA: Diagnosis not present

## 2016-09-06 DIAGNOSIS — Z0000 Encounter for general adult medical examination without abnormal findings: Secondary | ICD-10-CM | POA: Diagnosis not present

## 2016-09-06 DIAGNOSIS — D696 Thrombocytopenia, unspecified: Secondary | ICD-10-CM | POA: Diagnosis not present

## 2016-09-06 DIAGNOSIS — Z1389 Encounter for screening for other disorder: Secondary | ICD-10-CM | POA: Diagnosis not present

## 2016-09-09 ENCOUNTER — Other Ambulatory Visit: Payer: Self-pay | Admitting: *Deleted

## 2016-09-09 ENCOUNTER — Other Ambulatory Visit (HOSPITAL_COMMUNITY): Payer: Self-pay | Admitting: Interventional Radiology

## 2016-09-09 DIAGNOSIS — K769 Liver disease, unspecified: Secondary | ICD-10-CM

## 2016-09-10 ENCOUNTER — Other Ambulatory Visit: Payer: Self-pay | Admitting: Family Medicine

## 2016-09-10 DIAGNOSIS — E2839 Other primary ovarian failure: Secondary | ICD-10-CM

## 2016-09-10 DIAGNOSIS — Z1231 Encounter for screening mammogram for malignant neoplasm of breast: Secondary | ICD-10-CM

## 2016-09-10 DIAGNOSIS — M8580 Other specified disorders of bone density and structure, unspecified site: Secondary | ICD-10-CM

## 2016-09-23 DIAGNOSIS — K7689 Other specified diseases of liver: Secondary | ICD-10-CM | POA: Diagnosis not present

## 2016-10-02 ENCOUNTER — Other Ambulatory Visit: Payer: Medicare Other

## 2016-10-02 ENCOUNTER — Encounter (HOSPITAL_COMMUNITY): Payer: Self-pay

## 2016-10-02 ENCOUNTER — Ambulatory Visit (HOSPITAL_COMMUNITY)
Admission: RE | Admit: 2016-10-02 | Discharge: 2016-10-02 | Disposition: A | Discharge status: Home / Self Care | Payer: Medicare Other | Source: Ambulatory Visit | Attending: Interventional Radiology | Admitting: Interventional Radiology

## 2016-10-04 ENCOUNTER — Ambulatory Visit: Payer: Medicare Other

## 2016-10-04 ENCOUNTER — Other Ambulatory Visit: Payer: Medicare Other

## 2016-10-17 ENCOUNTER — Ambulatory Visit: Payer: Medicare Other

## 2016-10-17 ENCOUNTER — Other Ambulatory Visit: Payer: Medicare Other

## 2016-10-22 ENCOUNTER — Ambulatory Visit (HOSPITAL_COMMUNITY)
Admission: RE | Admit: 2016-10-22 | Discharge: 2016-10-22 | Disposition: A | Discharge status: Home / Self Care | Payer: Medicare Other | Source: Ambulatory Visit | Attending: Interventional Radiology | Admitting: Interventional Radiology

## 2016-10-22 DIAGNOSIS — R161 Splenomegaly, not elsewhere classified: Secondary | ICD-10-CM | POA: Insufficient documentation

## 2016-10-22 DIAGNOSIS — K7460 Unspecified cirrhosis of liver: Secondary | ICD-10-CM | POA: Diagnosis not present

## 2016-10-22 DIAGNOSIS — K7581 Nonalcoholic steatohepatitis (NASH): Secondary | ICD-10-CM | POA: Insufficient documentation

## 2016-10-22 DIAGNOSIS — K7689 Other specified diseases of liver: Secondary | ICD-10-CM | POA: Diagnosis not present

## 2016-10-22 DIAGNOSIS — K769 Liver disease, unspecified: Secondary | ICD-10-CM

## 2016-10-22 MED ORDER — GADOXETATE DISODIUM 0.25 MMOL/ML IV SOLN
10.0000 mL | Freq: Once | INTRAVENOUS | Status: AC | PRN
  Administered 2016-10-22: 10 mL via INTRAVENOUS

## 2016-10-29 ENCOUNTER — Ambulatory Visit
Admission: RE | Admit: 2016-10-29 | Discharge: 2016-10-29 | Disposition: A | Discharge status: Home / Self Care | Payer: Medicare Other | Source: Ambulatory Visit | Attending: Interventional Radiology | Admitting: Interventional Radiology

## 2016-10-29 DIAGNOSIS — K769 Liver disease, unspecified: Secondary | ICD-10-CM

## 2016-10-29 DIAGNOSIS — R300 Dysuria: Secondary | ICD-10-CM | POA: Diagnosis not present

## 2016-10-29 DIAGNOSIS — K7460 Unspecified cirrhosis of liver: Secondary | ICD-10-CM | POA: Diagnosis not present

## 2016-10-29 DIAGNOSIS — R161 Splenomegaly, not elsewhere classified: Secondary | ICD-10-CM | POA: Diagnosis not present

## 2016-10-29 DIAGNOSIS — D171 Benign lipomatous neoplasm of skin and subcutaneous tissue of trunk: Secondary | ICD-10-CM | POA: Diagnosis not present

## 2016-10-29 DIAGNOSIS — K760 Fatty (change of) liver, not elsewhere classified: Secondary | ICD-10-CM | POA: Diagnosis not present

## 2016-10-29 HISTORY — PX: IR RADIOLOGIST EVAL & MGMT: IMG5224

## 2016-10-29 NOTE — Progress Notes (Signed)
Patient ID: Crystal Barnett, female   DOB: 1941/03/27, 76 y.o.   MRN: [id number]   Referring Physician(s): Stark Klein  Chief Complaint: The patient is seen in follow up today for surveillance of a liver lesion  History of present illness:  Crystal Barnett is a 76 y.o. female with a history of [place] cirrhosis comlicated by thrombocytopenia. MRI imaging was performed first in December 2016 to evaluate a region of possible concern in the left hepatic lobe. On that study, there was a small 9 mm enhancing focus in hepatic segment 7 in the posterosuperior aspect of the hepatic dome. This subsequently been followed at 6 month intervals with repeat MRI scans of the abdomen. On the scan which was performed on 02/27/2016, the lesion measures 10 mm compared to 9 mm nearly one year previously. However, the lesion does appear slightly more prominent and demonstrates enhancement characteristics which are concerning for hepatocellular carcinoma.  On her most recent imaging on 10/22/2016, this area of concern showed mild interval enlargement of a segment 7 liver lesion, which is still highly suspicious for [place].  Clinically, the patient is doing well.  She has no new complaints.  No change in her medical history.  Her last HgbA1C was 4.  She denies any abdominal pain or change in appetite.  Past Medical History:  Diagnosis Date  . ALLERGIC RHINITIS 01/12/2008  . Colon polyps    hyperplastic  . Depression   . Diabetes ([place])   . Fatty liver   . GOITER, MULTINODULAR 01/12/2008  . Hepatic cirrhosis ([place])   . Hyperlipidemia   . Hypertension   . HYPOTHYROIDISM 09/07/2008  . 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 Splenomegaly   . Thrombocytopenia ([place])    Appears chronic from at least 2014  . Vitamin D deficiency     Past Surgical History:  Procedure Laterality Date  . arthroscopic left knee  2005  . BREAST SURGERY  2008   Breast Biopsy   . CHOLECYSTECTOMY  1985  .  IR GENERIC HISTORICAL  04/02/2016   IR RADIOLOGIST EVAL & MGMT 04/02/2016 Jacqulynn Cadet, MD [facility] INTERV RAD    Allergies: Patient has no known allergies.  Medications: Prior to Admission medications   Medication Sig Start Date End Date Taking? Authorizing Provider  cholecalciferol (VITAMIN D) 1000 UNITS tablet Take 2,000 Units by mouth daily.   Yes [provider]  fluticasone (FLONASE) 50 MCG/ACT nasal spray Place into both nostrils daily.   Yes [provider]  levothyroxine (SYNTHROID, LEVOTHROID) 50 MCG tablet Take 1 tablet (50 mcg total) by mouth daily. 03/01/16  Yes Renato Shin, MD  lisinopril-hydrochlorothiazide [facility]) 10-12.5 MG tablet  03/14/15  Yes [provider]  metFORMIN (GLUCOPHAGE) 500 MG tablet Take 1 tablet by mouth once daily    Yes [provider]  montelukast (SINGULAIR) 10 MG tablet  03/25/14  Yes [provider]  Multiple Vitamin (MULTIVITAMIN) tablet Take 1 tablet by mouth daily.   Yes [provider]     Family History  Problem Relation Age of Onset  . Goiter Maternal Grandmother   . Stroke Father 12    Social History   Social History  . Marital status: Single    Spouse name: N/A  . Number of children: N/A  . Years of education: N/A   Occupational History  . 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 Smoking status: Former Smoker    Quit date: 05/13/1990  . Smokeless tobacco: Never Used  . Alcohol use No  . Drug use: No  . Sexual activity: Not on file   Other Topics Concern  . Not on file   Social History Narrative  . No narrative on file     Vital Signs: BP (!) 163/70   Pulse 73   Temp 98 F (36.7 C) (Oral)   Resp 16   Ht 5' 5"  (1.651 m)   Wt 220 lb (99.8 kg)   SpO2 97%   BMI 36.61 kg/m   Physical Exam  Gen: Pleasant, obese, white female in NAD Heart: regular rate and rhythm Lungs: CTAB Abd:  soft, NT, obese, +B Ext: no cyanosis, edema. +2 radial pulses bilaterally  Imaging: MRI ABDOMEN WITHOUT AND WITH CONTRAST  TECHNIQUE: Multiplanar multisequence MR imaging of the abdomen was performed both before and after the administration of intravenous contrast.  CONTRAST:  4m EOVIST GADOXETATE DISODIUM 0.25 MOL/L IV SOLN  COMPARISON:  MRI of 02/27/2016.  Clinic note of 04/02/2016.  FINDINGS: Motion degradation involves the arterial phase post-contrast images primarily.  Lower chest: Normal heart size without pericardial or pleural effusion.  Hepatobiliary: Moderate to marked cirrhosis. Segment 7 liver lesion has undergone mild interval enlargement. For example, on T2 weighted imaging, measures 12 x 10 mm (image 13/series 7) versus 10 x 9 mm on the prior. On post-contrast image 27/ series 502, measures 17 x 13 mm today versus 14 x 10 mm on the prior (when remeasured). Demonstrates arterial and portal venous phase hyper enhancement with relative isointensity on more delayed post-contrast imaging.  Left hepatic lobe tiny lesion is likely a cyst and is similar.  Mild hepatic steatosis. Cholecystectomy, without biliary ductal dilatation.  Pancreas:  Normal, without mass or ductal dilatation.  Spleen:  Mild splenomegaly, 14.3 cm craniocaudal.  Adrenals/Urinary Tract: Normal adrenal glands. Normal kidneys, without hydronephrosis.  Stomach/Bowel: Normal stomach and abdominal bowel loops.  Vascular/Lymphatic: Patent hepatic and portal veins. No specific evidence of portal venous hypertension. Circumaortic left renal vein. No retroperitoneal or retrocrural adenopathy.  Other:  Small volume perihepatic ascites is unchanged.  Musculoskeletal: No acute osseous abnormality.  IMPRESSION: 1. Mild interval enlargement of a segment 7 liver lesion, highly suspicious for hepatocellular carcinoma. 2. Cirrhosis, mild hepatic steatosis, and mild  splenomegaly  Labs:  CBC:  Recent Labs  02/27/16 0855 06/03/16 0931  WBC 4.4 3.5*  HGB 13.9 13.1  HCT 39.9 38.1  PLT 95* 80*    COAGS:  Recent Labs  03/04/16 1121 06/03/16 0931  INR 1.10* 1.00*    BMP:  Recent Labs  02/27/16 0855 06/03/16 0931  NA 141 139  K 4.1 3.7  CO2 27 25  GLUCOSE 143* 173*  BUN 11.3 9.1  CALCIUM 8.9 9.2  CREATININE 0.7 0.7    LIVER FUNCTION TESTS:  Recent Labs  02/27/16 0855 06/03/16 0931  BILITOT 0.63 0.70  AST 43* 33  ALT 35 30  ALKPHOS 62 65  PROT 7.0 6.4  ALBUMIN 3.6 3.5    Assessment:  1. Liver lesion c/w HCC  This lesion in Crystal Barnett liver appears consistent with [place] however, this is very slow growing indicating this is not aggressive.  In the last year and a half it has grown very minimally.  It is still small enough that it would be difficult to treat this effectively.  For now, we have  discussed with Crystal Barnett, that our recommendations would be to continue observation of this lesion with an MRI every 6 months, 3 months if significant change occurs, until this is between 2-3cm where we can more effectively target this lesion for treatment.  She understands and is agreeable with this plan.  Therefore, we will repeat an MRI of the abdomen with contrast in 6 months followed by a follow up with Dr. Laurence Ferrari to discuss the results.  Signed: Henreitta Cea, PA-C 10/29/2016, 9:41 AM   Please refer to Dr. Katrinka Blazing attestation of this note for management and plan.

## 2016-12-02 ENCOUNTER — Other Ambulatory Visit (HOSPITAL_BASED_OUTPATIENT_CLINIC_OR_DEPARTMENT_OTHER): Payer: Medicare Other

## 2016-12-02 ENCOUNTER — Telehealth: Payer: Self-pay | Admitting: Hematology

## 2016-12-02 ENCOUNTER — Encounter: Payer: Self-pay | Admitting: Hematology

## 2016-12-02 ENCOUNTER — Ambulatory Visit (HOSPITAL_BASED_OUTPATIENT_CLINIC_OR_DEPARTMENT_OTHER): Payer: Medicare Other | Admitting: Hematology

## 2016-12-02 VITALS — BP 134/59 | HR 72 | Temp 97.8°F | Resp 18 | Ht 65.0 in | Wt 233.3 lb

## 2016-12-02 DIAGNOSIS — K769 Liver disease, unspecified: Secondary | ICD-10-CM

## 2016-12-02 DIAGNOSIS — D696 Thrombocytopenia, unspecified: Secondary | ICD-10-CM

## 2016-12-02 DIAGNOSIS — C228 Malignant neoplasm of liver, primary, unspecified as to type: Secondary | ICD-10-CM | POA: Diagnosis not present

## 2016-12-02 DIAGNOSIS — K7460 Unspecified cirrhosis of liver: Secondary | ICD-10-CM | POA: Diagnosis not present

## 2016-12-02 LAB — COMPREHENSIVE METABOLIC PANEL
ALBUMIN: 3.5 g/dL (ref 3.5–5.0)
ALK PHOS: 62 U/L (ref 40–150)
ALT: 29 U/L (ref 0–55)
ANION GAP: 8 meq/L (ref 3–11)
AST: 34 U/L (ref 5–34)
BUN: 9.8 mg/dL (ref 7.0–26.0)
CO2: 24 mEq/L (ref 22–29)
Calcium: 8.7 mg/dL (ref 8.4–10.4)
Chloride: 108 mEq/L (ref 98–109)
Creatinine: 0.7 mg/dL (ref 0.6–1.1)
EGFR: 85 mL/min/{1.73_m2} — AB (ref >90)
GLUCOSE: 151 mg/dL — AB (ref 70–140)
POTASSIUM: 4.4 meq/L (ref 3.5–5.1)
SODIUM: 140 meq/L (ref 136–145)
Total Bilirubin: 0.6 mg/dL (ref 0.20–1.20)
Total Protein: 6.3 g/dL — ABNORMAL LOW (ref 6.4–8.3)

## 2016-12-02 LAB — CBC & DIFF AND RETIC
BASO%: 0.3 % (ref 0.0–2.0)
Basophils Absolute: 0 10*3/uL (ref 0.0–0.1)
EOS ABS: 0.1 10*3/uL (ref 0.0–0.5)
EOS%: 2.6 % (ref 0.0–7.0)
HCT: 38.8 % (ref 34.8–46.6)
HEMOGLOBIN: 13.2 g/dL (ref 11.6–15.9)
Immature Retic Fract: 2.8 % (ref 1.60–10.00)
LYMPH%: 25.7 % (ref 14.0–49.7)
MCH: 31.1 pg (ref 25.1–34.0)
MCHC: 34 g/dL (ref 31.5–36.0)
MCV: 91.3 fL (ref 79.5–101.0)
MONO#: 0.3 10*3/uL (ref 0.1–0.9)
MONO%: 8.1 % (ref 0.0–14.0)
NEUT%: 63.3 % (ref 38.4–76.8)
NEUTROS ABS: 2.4 10*3/uL (ref 1.5–6.5)
PLATELETS: 79 10*3/uL — AB (ref 145–400)
RBC: 4.25 10*6/uL (ref 3.70–5.45)
RDW: 13.6 % (ref 11.2–14.5)
Retic %: 1.73 % (ref 0.70–2.10)
Retic Ct Abs: 73.53 10*3/uL (ref 33.70–90.70)
WBC: 3.8 10*3/uL — AB (ref 3.9–10.3)
lymph#: 1 10*3/uL (ref 0.9–3.3)

## 2016-12-02 NOTE — Patient Instructions (Signed)
Thank you for choosing [facility] to provide your oncology and hematology care.  To afford each patient quality time with our providers, please arrive 30 minutes before your scheduled appointment time.  If you arrive late for your appointment, you may be asked to reschedule.  We strive to give you quality time with our providers, and arriving late affects you and other patients whose appointments are after yours.  If you are a no show for multiple scheduled visits, you may be dismissed from the clinic at the providers discretion.   Again, thank you for choosing [facility], our hope is that these requests will decrease the amount of time that you wait before being seen by our physicians.  ______________________________________________________________________ Should you have questions after your visit to the  Cancer Center, please contact our office at [phone] between the hours of 8:30 and 4:30 p.m.    Voicemails left after 4:30p.m will not be returned until the following business day.   For prescription refill requests, please have your pharmacy contact us directly.  Please also try to allow 48 hours for prescription requests.   Please contact the scheduling department for questions regarding scheduling.  For scheduling of procedures such as PET scans, CT scans, MRI, Ultrasound, etc please contact central scheduling at [phone].   Resources For Cancer Patients and Caregivers:  American Cancer Society:  800-227-2345  Can help patients locate various types of support and financial assistance Cancer Care: 1-800-813-HOPE (4673) Provides financial assistance, online support groups, medication/co-pay assistance.   [place] DSS:  336-641-3447 Where to apply for food stamps, Medicaid, and utility assistance Medicare Rights Center: 800-333-4114 Helps people with Medicare understand their rights and benefits, navigate the Medicare system, and secure the  quality healthcare they deserve SCAT: 336-333-6589 [place] [employer]'s shared-ride transportation service for eligible riders who have a disability that prevents them from riding the fixed route bus.   For additional information on assistance programs please contact our social worker:   Grier Hock/Abigail Elmore:  [phone] 

## 2016-12-02 NOTE — Telephone Encounter (Signed)
Scheduled appt per 7/23 los - Gave patient AVS and calender per los. - lab and f/u in 6 months.

## 2016-12-03 LAB — AFP TUMOR MARKER: AFP, SERUM, TUMOR MARKER: 6.7 ng/mL (ref 0.0–8.3)

## 2016-12-11 ENCOUNTER — Ambulatory Visit
Admission: RE | Admit: 2016-12-11 | Discharge: 2016-12-11 | Disposition: A | Discharge status: Home / Self Care | Payer: Medicare Other | Source: Ambulatory Visit | Attending: Family Medicine | Admitting: Family Medicine

## 2016-12-11 DIAGNOSIS — Z1231 Encounter for screening mammogram for malignant neoplasm of breast: Secondary | ICD-10-CM | POA: Diagnosis not present

## 2016-12-11 DIAGNOSIS — M810 Age-related osteoporosis without current pathological fracture: Secondary | ICD-10-CM | POA: Diagnosis not present

## 2016-12-11 DIAGNOSIS — Z780 Asymptomatic menopausal state: Secondary | ICD-10-CM | POA: Diagnosis not present

## 2016-12-11 DIAGNOSIS — M8580 Other specified disorders of bone density and structure, unspecified site: Secondary | ICD-10-CM

## 2016-12-11 DIAGNOSIS — E2839 Other primary ovarian failure: Secondary | ICD-10-CM

## 2016-12-23 ENCOUNTER — Other Ambulatory Visit: Payer: Self-pay | Admitting: Endocrinology

## 2016-12-24 NOTE — Progress Notes (Signed)
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 #3 Liver nodule ?dysplatic nodule vs HCC (will be following up with Dr. Laurence Ferrari)  Current Treatment: observation and further evaluation  INTERVAL HISTORY: Patient is here for follow-up regarding her thrombocytopenia and follow-up regarding her concerning liver lesion - likely low grade HCC vs dysplastic nodule. Patient is following with interventional radiology Dr. Laurence Ferrari and had an MRI of the abdomen with and without contrast on 10/22/2016 minimal interval enlargement of segment 7 liver lesion which is now 12 by 10 mm versus 10 x 9 mm on the prior imaging study. She was recommended continued follow-up with no IR ablation currently.   No issues with bleeding or excessive bruising. Platelet counts are stable at 79k. No significant new medications. No other acute new symptoms.    REVIEW OF SYSTEMS:    10 Point review of systems of done and is negative except as noted above.  . Past Medical History:  Diagnosis Date  . ALLERGIC RHINITIS 01/12/2008  . Colon polyps    hyperplastic  . Depression   . Diabetes ([place])   . Fatty liver   . GOITER, MULTINODULAR 01/12/2008  . Hepatic cirrhosis ([place])   . Hyperlipidemia   . Hypertension   . HYPOTHYROIDISM 09/07/2008  . 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 Splenomegaly   . Thrombocytopenia ([place])    Appears chronic from at least 2014  . Vitamin D deficiency     . Past Surgical History:  Procedure Laterality Date  . arthroscopic left knee  2005  . BREAST SURGERY  2008   Breast Biopsy   . CHOLECYSTECTOMY  1985  . IR  GENERIC HISTORICAL  04/02/2016   IR RADIOLOGIST EVAL & MGMT 04/02/2016 Jacqulynn Cadet, MD [facility] INTERV RAD    . Social History  Substance Use Topics  . Smoking status: Former Smoker    Quit date: 05/13/1990  . Smokeless tobacco: Never Used  . Alcohol use No    ALLERGIES:  has No Known Allergies.  MEDICATIONS:  Current Outpatient Prescriptions  Medication Sig Dispense Refill  . cholecalciferol (VITAMIN D) 1000 UNITS tablet Take 2,000 Units by mouth daily.    . fluticasone (FLONASE) 50 MCG/ACT nasal spray Place into both nostrils daily.    Marland Kitchen levothyroxine (SYNTHROID, LEVOTHROID) 50 MCG tablet TAKE 1 TABLET (50 MCG TOTAL) BY MOUTH DAILY. 90 tablet 3  . lisinopril-hydrochlorothiazide (PRINZIDE,ZESTORETIC) 10-12.5 MG tablet     . metFORMIN (GLUCOPHAGE) 500 MG tablet Take 1 tablet by mouth once daily     . montelukast (SINGULAIR) 10 MG tablet     . Multiple Vitamin (MULTIVITAMIN) tablet Take 1 tablet by mouth daily.     No current facility-administered medications for this visit.     PHYSICAL EXAMINATION: ECOG PERFORMANCE STATUS: 1 - Symptomatic but completely ambulatory  . Vitals:   12/02/16 1006  BP: (!) 134/59  Pulse: 72  Resp: 18  Temp: 97.8 F (36.6 C)  SpO2: 98%    Filed Weights   12/02/16 1006  Weight: 233 lb 4.8 oz (105.8 kg)   .Body mass index is 38.82 kg/m.  GENERAL:alert, in no acute distress and comfortable SKIN: skin color, texture, turgor are normal, no rashes or significant lesions EYES: normal, conjunctiva are pink and non-injected, sclera clear OROPHARYNX:no exudate, no erythema and lips, buccal mucosa, and tongue normal  NECK: supple, no JVD, thyroid normal size, non-tender, without nodularity LYMPH:  no palpable lymphadenopathy in the cervical, axillary or inguinal LUNGS: clear to auscultation with normal respiratory effort HEART: regular rate & rhythm,  no murmurs and no lower extremity edema ABDOMEN: abdomen soft, non-tender, normoactive bowel  sounds ,No palpable hepatosplenomegaly Musculoskeletal: no cyanosis of digits and no clubbing  PSYCH: alert & oriented x 3 with fluent speech NEURO: no focal motor/sensory deficits  LABORATORY DATA:   I have reviewed the data as listed  . CBC Latest Ref Rng & Units 12/02/2016 06/03/2016 02/27/2016  WBC 3.9 - 10.3 10e3/uL 3.8(L) 3.5(L) 4.4  Hemoglobin 11.6 - 15.9 g/dL 13.2 13.1 13.9  Hematocrit 34.8 - 46.6 % 38.8 38.1 39.9  Platelets 145 - 400 10e3/uL 79(L) 80(L) 95(L)    . CMP Latest Ref Rng & Units 12/02/2016 06/03/2016 02/27/2016  Glucose 70 - 140 mg/dl 151(H) 173(H) 143(H)  BUN 7.0 - 26.0 mg/dL 9.8 9.1 11.3  Creatinine 0.6 - 1.1 mg/dL 0.7 0.7 0.7  Sodium 136 - 145 mEq/L 140 139 141  Potassium 3.5 - 5.1 mEq/L 4.4 3.7 4.1  Chloride 96 - 112 mEq/L - - -  CO2 22 - 29 mEq/L 24 25 27   Calcium 8.4 - 10.4 mg/dL 8.7 9.2 8.9  Total Protein 6.4 - 8.3 g/dL 6.3(L) 6.4 7.0  Total Bilirubin 0.20 - 1.20 mg/dL 0.60 0.70 0.63  Alkaline Phos 40 - 150 U/L 62 65 62  AST 5 - 34 U/L 34 33 43(H)  ALT 0 - 55 U/L 29 30 35     RADIOLOGY   MRI abd w and wo contrast 10/22/2016 CLINICAL DATA:  Followup of liver lesions. Nonalcoholic steatohepatitis induced cirrhosis. Thrombocytopenia.  EXAM: MRI ABDOMEN WITHOUT AND WITH CONTRAST  TECHNIQUE: Multiplanar multisequence MR imaging of the abdomen was performed both before and after the administration of intravenous contrast.  CONTRAST:  19m EOVIST GADOXETATE DISODIUM 0.25 MOL/L IV SOLN  COMPARISON:  MRI of 02/27/2016.  Clinic note of 04/02/2016.  FINDINGS: Motion degradation involves the arterial phase post-contrast images primarily.  Lower chest: Normal heart size without pericardial or pleural effusion.  Hepatobiliary: Moderate to marked cirrhosis. Segment 7 liver lesion has undergone mild interval enlargement. For example, on T2 weighted imaging, measures 12 x 10 mm (image 13/series 7) versus 10 x 9 mm on the prior. On  post-contrast image 27/ series 502, measures 17 x 13 mm today versus 14 x 10 mm on the prior (when remeasured). Demonstrates arterial and portal venous phase hyper enhancement with relative isointensity on more delayed post-contrast imaging.  Left hepatic lobe tiny lesion is likely a cyst and is similar.  Mild hepatic steatosis. Cholecystectomy, without biliary ductal dilatation.  Pancreas:  Normal, without mass or ductal dilatation.  Spleen:  Mild splenomegaly, 14.3 cm craniocaudal.  Adrenals/Urinary Tract: Normal adrenal glands. Normal kidneys, without hydronephrosis.  Stomach/Bowel: Normal stomach and abdominal bowel loops.  Vascular/Lymphatic: Patent hepatic and portal veins. No specific evidence of portal venous hypertension. Circumaortic left renal vein. No retroperitoneal or retrocrural adenopathy.  Other:  Small volume perihepatic ascites is unchanged.  Musculoskeletal: No acute osseous abnormality.  IMPRESSION: 1. Mild interval enlargement of a segment 7 liver lesion, highly suspicious for hepatocellular carcinoma. 2. Cirrhosis, mild hepatic steatosis, and  mild splenomegaly.   Electronically Signed   By: Abigail Miyamoto M.D.   On: 10/22/2016 10:22    ASSESSMENT & PLAN:   #1 Chronic thrombocytopenia. Platelet counts today are relatively stable at 79k (were 80k -6 months ago) Likely related to cirrhosis related to NASH with mild splenomegaly.  B12, TSH, LDH within normal limits. 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 Recent EGD showed no evidence of varices. Subtle changes of portal hypertensive gastropathy. Plan -Patient's platelet count is stable @ 79k with no issues with bleeding or bruising.  No other intervention indicated at this time. -The platelets are less than 30k or if they're less than 50k  And with bleeding issues -would consider the use of  Promacta or Nplate. -Continue liver related cares with Dr. Henrene Pastor --liver cirrhosis mx per GI and PCP   #3  Lesion in the RIGHT hepatic lobe has imaging characteristics highly suspicious for hepatic cellular carcinoma including arterial enhancement and a new peripheral rim. Second similar lesion in the LEFT hepatic lobe is very small. MRI abdomen 10/22/2016 as noted above shows minimal increase in size of the liver lesion . AFP tumor markers stable . Plan -She continues to follow-up with Laurence Ferrari (IR)- to determine need for ablation of the liver lesion. He did not deem necessary at this time. -Patient notes that that is a plan to repeat an MRI with IR in 6 months from her previous one .  RTC with Dr Irene Limbo in 6 months with labs  Continue follow-up with primary care physician.  I spent 15 minutes counseling the patient face to face. The total time spent in the appointment was 20 minutes and more than 50% was on counseling and direct patient cares.    Sullivan Lone MD [place] AAHIVMS [facility] Hematology/Oncology Physician [facility]  (Office):       [phone] (Work cell):  [phone] (Fax):           [phone]

## 2017-02-17 ENCOUNTER — Encounter: Payer: Self-pay | Admitting: Internal Medicine

## 2017-02-17 ENCOUNTER — Encounter (INDEPENDENT_AMBULATORY_CARE_PROVIDER_SITE_OTHER): Payer: Self-pay

## 2017-02-17 ENCOUNTER — Ambulatory Visit (INDEPENDENT_AMBULATORY_CARE_PROVIDER_SITE_OTHER): Payer: Medicare Other | Admitting: Internal Medicine

## 2017-02-17 VITALS — BP 142/74 | HR 76 | Ht 64.0 in | Wt 221.4 lb

## 2017-02-17 DIAGNOSIS — R932 Abnormal findings on diagnostic imaging of liver and biliary tract: Secondary | ICD-10-CM

## 2017-02-17 DIAGNOSIS — K7581 Nonalcoholic steatohepatitis (NASH): Secondary | ICD-10-CM | POA: Diagnosis not present

## 2017-02-17 DIAGNOSIS — K7460 Unspecified cirrhosis of liver: Secondary | ICD-10-CM | POA: Diagnosis not present

## 2017-02-17 NOTE — Patient Instructions (Signed)
Please follow up in one year

## 2017-02-17 NOTE — Progress Notes (Signed)
HISTORY OF PRESENT ILLNESS:  Crystal Barnett is a 76 y.o. female with multiple medical problems who has been followed in this office for decompensated NASH cirrhosis. She is also followed by hematology for thrombocytopenia and an indeterminant liver lesion which they are monitoring radiologically. I last saw the patient 01/22/2016. At that time her liver disease remained compensated. She has completed Twinrix vaccination series. Her last upper endoscopy in March 2017 was negative for varices. She is aged out of routine screening colonoscopy program secondary to age and comorbidities. The patient is pleased report that through dietary measures she has lost approximately 15 pounds over the past year. She denies problems with edema, ascites, bleeding, or issues with mentation may suggest encephalopathy. Her last comprehensive metabolic panel obtained 63/05/6008 was unremarkable with normal liver tests including albumin and bilirubin. CBC at that time was remarkable for stable mild leukopenia and thrombocytopenia. Her prothrombin time from January was normal with an INR of 1.0. Last MRI of the abdomen with contrast revealed mild interval enlargement of segment 7 liver lesion suspicious for HCC. Cirrhosis with hepatic steatosis and splenomegaly also present.  REVIEW OF SYSTEMS:  All non-GI ROS negative except for sinus allergy trouble, back pain, hearing problems  Past Medical History:  Diagnosis Date  . ALLERGIC RHINITIS 01/12/2008  . Colon polyps    hyperplastic  . Depression   . Diabetes ([place])   . Fatty liver   . GOITER, MULTINODULAR 01/12/2008  . Hepatic cirrhosis ([place])   . Hyperlipidemia   . Hypertension   . HYPOTHYROIDISM 09/07/2008  . 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 Splenomegaly   . Thrombocytopenia ([place])    Appears chronic from at least 2014  . Vitamin D deficiency     Past Surgical History:  Procedure Laterality Date  . arthroscopic left  knee  2005  . BREAST SURGERY  2008   Breast Biopsy   . CHOLECYSTECTOMY  1985  . IR GENERIC HISTORICAL  04/02/2016   IR RADIOLOGIST EVAL & MGMT 04/02/2016 Jacqulynn Cadet, MD [facility] INTERV RAD    Social History Lydia Guiles  reports that she quit smoking about 26 years ago. She has never used smokeless tobacco. She reports that she does not drink alcohol or use drugs.  family history includes Goiter in her maternal grandmother; Stroke (age of onset: 18) in her father.  No Known Allergies     PHYSICAL EXAMINATION: Vital signs: BP (!) 142/74 (BP Location: Left Arm, Patient Position: Sitting, Cuff Size: Large)   Pulse 76   Ht 5' 4"  (1.626 m) Comment: height measured wihtout shoes  Wt 221 lb 6 oz (100.4 kg)   BMI 38.00 kg/m   Constitutional: generally well-appearing, no acute distress Psychiatric: alert and oriented x3, cooperative Eyes: extraocular movements intact, anicteric, conjunctiva pink Mouth: oral pharynx moist, no lesions Neck: supple no lymphadenopathy Cardiovascular: heart regular rate and rhythm, no murmur Lungs: clear to auscultation bilaterally Abdomen: soft, Obese, nontender, nondistended, no obvious ascites, no peritoneal signs, normal bowel sounds, no organomegaly. Prior surgical incisions well-healed Rectal:Omitted clubbing, cyanosis, or Extremities: no lower extremity edema bilaterally Skin: no lesions on visible extremities Neuro: No focal deficits. No asterixis.   ASSESSMENT:  #1. NASH cirrhosis. Compensated #2. Status post Twinrix vaccination series #3. Normal EGD March 2017. No varices #4. Indeterminate liver lesion being followed by hematology/oncology. Last MRI June 2018 as described #5. Colon cancer screening. Up-to-date. Last examination 2008  negative for neoplasia (Dr. Penelope Coop). Aged out of routine screening program #6. Obesity. 15 pound weight loss over the past year by intent. Current BMI 38  PLAN:  #1. Continued weight loss #2. Modest  exercise as tolerated #3. Ongoing monitoring of liver lesion per hematology/pathology #4. Routine GI follow-up one year.  15 minutes was spent face-to-face with the patient. Greater than 50% a time his use for counseling regarding her chronic liver disease, its management, potential complications, and monitoring

## 2017-03-10 DIAGNOSIS — Z23 Encounter for immunization: Secondary | ICD-10-CM | POA: Diagnosis not present

## 2017-03-10 DIAGNOSIS — D696 Thrombocytopenia, unspecified: Secondary | ICD-10-CM | POA: Diagnosis not present

## 2017-03-10 DIAGNOSIS — M810 Age-related osteoporosis without current pathological fracture: Secondary | ICD-10-CM | POA: Diagnosis not present

## 2017-03-10 DIAGNOSIS — K7581 Nonalcoholic steatohepatitis (NASH): Secondary | ICD-10-CM | POA: Diagnosis not present

## 2017-03-10 DIAGNOSIS — E559 Vitamin D deficiency, unspecified: Secondary | ICD-10-CM | POA: Diagnosis not present

## 2017-03-10 DIAGNOSIS — Z7984 Long term (current) use of oral hypoglycemic drugs: Secondary | ICD-10-CM | POA: Diagnosis not present

## 2017-03-10 DIAGNOSIS — I10 Essential (primary) hypertension: Secondary | ICD-10-CM | POA: Diagnosis not present

## 2017-03-10 DIAGNOSIS — J302 Other seasonal allergic rhinitis: Secondary | ICD-10-CM | POA: Diagnosis not present

## 2017-03-10 DIAGNOSIS — E7800 Pure hypercholesterolemia, unspecified: Secondary | ICD-10-CM | POA: Diagnosis not present

## 2017-03-10 DIAGNOSIS — E119 Type 2 diabetes mellitus without complications: Secondary | ICD-10-CM | POA: Diagnosis not present

## 2017-03-10 DIAGNOSIS — Z6839 Body mass index (BMI) 39.0-39.9, adult: Secondary | ICD-10-CM | POA: Diagnosis not present

## 2017-03-19 ENCOUNTER — Telehealth: Payer: Self-pay | Admitting: Internal Medicine

## 2017-03-19 NOTE — Telephone Encounter (Signed)
No contraindication from GI standpoint

## 2017-03-19 NOTE — Telephone Encounter (Signed)
Please advise 

## 2017-03-20 ENCOUNTER — Encounter: Payer: Self-pay | Admitting: Interventional Radiology

## 2017-03-21 NOTE — Telephone Encounter (Signed)
Left message for Crystal Barnett that per Dr. Henrene Pastor there is no contradiction from a GI standpoint with Fosamax.

## 2017-03-31 ENCOUNTER — Other Ambulatory Visit: Payer: Self-pay | Admitting: *Deleted

## 2017-03-31 ENCOUNTER — Other Ambulatory Visit (HOSPITAL_COMMUNITY): Payer: Self-pay | Admitting: Interventional Radiology

## 2017-03-31 DIAGNOSIS — K769 Liver disease, unspecified: Secondary | ICD-10-CM

## 2017-03-31 DIAGNOSIS — R160 Hepatomegaly, not elsewhere classified: Secondary | ICD-10-CM

## 2017-03-31 DIAGNOSIS — K7460 Unspecified cirrhosis of liver: Secondary | ICD-10-CM

## 2017-03-31 DIAGNOSIS — K7581 Nonalcoholic steatohepatitis (NASH): Principal | ICD-10-CM

## 2017-04-18 DIAGNOSIS — K769 Liver disease, unspecified: Secondary | ICD-10-CM | POA: Diagnosis not present

## 2017-04-22 LAB — COMPLETE METABOLIC PANEL WITH GFR
AG Ratio: 1.5 (calc) (ref 1.0–2.5)
ALBUMIN MSPROF: 3.8 g/dL (ref 3.6–5.1)
ALT: 23 U/L (ref 6–29)
AST: 34 U/L (ref 10–35)
Alkaline phosphatase (APISO): 54 U/L (ref 33–130)
BUN: 9 mg/dL (ref 7–25)
CALCIUM: 9.1 mg/dL (ref 8.6–10.4)
CO2: 31 mmol/L (ref 20–32)
CREATININE: 0.61 mg/dL (ref 0.60–0.93)
Chloride: 105 mmol/L (ref 98–110)
GFR, EST NON AFRICAN AMERICAN: 87 mL/min/{1.73_m2} (ref >=60)
GFR, Est African American: 101 mL/min/{1.73_m2} (ref >=60)
GLOBULIN: 2.5 g/dL (ref 1.9–3.7)
Glucose, Bld: 102 mg/dL (ref 65–139)
Potassium: 4 mmol/L (ref 3.5–5.3)
SODIUM: 140 mmol/L (ref 135–146)
Total Bilirubin: 0.5 mg/dL (ref 0.2–1.2)
Total Protein: 6.3 g/dL (ref 6.1–8.1)

## 2017-04-22 LAB — CBC
HEMATOCRIT: 37.8 % (ref 35.0–45.0)
HEMOGLOBIN: 13 g/dL (ref 11.7–15.5)
MCH: 30.5 pg (ref 27.0–33.0)
MCHC: 34.4 g/dL (ref 32.0–36.0)
MCV: 88.7 fL (ref 80.0–100.0)
MPV: 11.3 fL (ref 7.5–12.5)
Platelets: 91 10*3/uL — ABNORMAL LOW (ref 140–400)
RBC: 4.26 10*6/uL (ref 3.80–5.10)
RDW: 12.9 % (ref 11.0–15.0)
WBC: 3.1 10*3/uL — AB (ref 3.8–10.8)

## 2017-04-22 LAB — PROTIME-INR
INR: 1.1
PROTHROMBIN TIME: 11.2 s (ref 9.0–11.5)

## 2017-04-22 LAB — AFP TUMOR MARKER: AFP TUMOR MARKER: 5.4 ng/mL

## 2017-04-23 ENCOUNTER — Ambulatory Visit (HOSPITAL_COMMUNITY)
Admission: RE | Admit: 2017-04-23 | Discharge: 2017-04-23 | Disposition: A | Discharge status: Home / Self Care | Payer: Medicare Other | Source: Ambulatory Visit | Attending: Interventional Radiology | Admitting: Interventional Radiology

## 2017-04-23 DIAGNOSIS — K7460 Unspecified cirrhosis of liver: Secondary | ICD-10-CM | POA: Diagnosis not present

## 2017-04-23 DIAGNOSIS — K7581 Nonalcoholic steatohepatitis (NASH): Secondary | ICD-10-CM | POA: Diagnosis present

## 2017-04-23 DIAGNOSIS — R161 Splenomegaly, not elsewhere classified: Secondary | ICD-10-CM | POA: Insufficient documentation

## 2017-04-23 DIAGNOSIS — R160 Hepatomegaly, not elsewhere classified: Secondary | ICD-10-CM | POA: Insufficient documentation

## 2017-04-23 DIAGNOSIS — K760 Fatty (change of) liver, not elsewhere classified: Secondary | ICD-10-CM | POA: Diagnosis not present

## 2017-04-23 MED ORDER — GADOXETATE DISODIUM 0.25 MMOL/ML IV SOLN
10.0000 mL | Freq: Once | INTRAVENOUS | Status: AC | PRN
  Administered 2017-04-23: 10 mL via INTRAVENOUS

## 2017-04-29 ENCOUNTER — Ambulatory Visit
Admission: RE | Admit: 2017-04-29 | Discharge: 2017-04-29 | Disposition: A | Discharge status: Home / Self Care | Payer: Medicare Other | Source: Ambulatory Visit | Attending: Interventional Radiology | Admitting: Interventional Radiology

## 2017-04-29 ENCOUNTER — Encounter: Payer: Self-pay | Admitting: *Deleted

## 2017-04-29 DIAGNOSIS — K7581 Nonalcoholic steatohepatitis (NASH): Principal | ICD-10-CM

## 2017-04-29 DIAGNOSIS — R160 Hepatomegaly, not elsewhere classified: Secondary | ICD-10-CM

## 2017-04-29 DIAGNOSIS — K7689 Other specified diseases of liver: Secondary | ICD-10-CM | POA: Diagnosis not present

## 2017-04-29 DIAGNOSIS — K7460 Unspecified cirrhosis of liver: Secondary | ICD-10-CM

## 2017-04-29 HISTORY — PX: IR RADIOLOGIST EVAL & MGMT: IMG5224

## 2017-04-29 NOTE — Progress Notes (Signed)
Chief Complaint: Patient was seen in follow up today for  Chief Complaint  Patient presents with  . Follow-up    1 yr follow up Surveillance of Right Liver Mass     at the request of Dr. Irene Limbo  Referring Physician(s): Dr. Irene Limbo  History of Present Illness: Crystal Barnett is a 76 y.o. female history of NASH cirrhosis comlicated by thrombocytopenia and minimal ascites. MRI imaging was performed first in December 2016 to evaluate a region of possible concern in the left hepatic lobe. On that study, there was a small 9 mm enhancing focus in hepatic segment 7 in the posterosuperior aspect of the hepatic dome. This subsequently been followed at 6 month intervals with repeat MRI scans of the abdomen. On the scan which was performed on 02/27/2016, the lesion measures 10 mm compared to 9 mm nearly one year previously. However, the lesion does appear slightly more prominent and demonstrates enhancement characteristics which are concerning for hepatocellular carcinoma.  Imaging on 10/22/2016, this area of concern showed mild interval enlargement to 1.7 cm.    We elected to continue observation despite the slow growth and her most recent MR imaging from 04/23/17 demonstrates stability.  No further growth, the lesion measures 1.6 cm.  Imaging characteristics remain highly concerning for low grade HCC.   Clinically, the patient is doing well.  She has no new complaints.  No change in her medical history.  Her last HgbA1C was 4.  She denies any abdominal pain or change in appetite.    Past Medical History:  Diagnosis Date  . ALLERGIC RHINITIS 01/12/2008  . Colon polyps    hyperplastic  . Depression   . Diabetes ([place])   . Fatty liver   . GOITER, MULTINODULAR 01/12/2008  . Hepatic cirrhosis ([place])   . Hyperlipidemia   . Hypertension   . HYPOTHYROIDISM 09/07/2008  . 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 Splenomegaly   . Thrombocytopenia ([place])    Appears  chronic from at least 2014  . Vitamin D deficiency     Past Surgical History:  Procedure Laterality Date  . arthroscopic left knee  2005  . BREAST SURGERY  2008   Breast Biopsy   . CHOLECYSTECTOMY  1985  . IR GENERIC HISTORICAL  04/02/2016   IR RADIOLOGIST EVAL & MGMT 04/02/2016 Jacqulynn Cadet, MD [facility] INTERV RAD  . IR RADIOLOGIST EVAL & MGMT  10/29/2016    Allergies: Patient has no known allergies.  Medications: Prior to Admission medications   Medication Sig Start Date End Date Taking? Authorizing Provider  cholecalciferol (VITAMIN D) 1000 UNITS tablet Take 2,000 Units by mouth daily.   Yes [provider]  fluticasone (FLONASE) 50 MCG/ACT nasal spray Place into both nostrils daily.   Yes [provider]  levothyroxine (SYNTHROID, LEVOTHROID) 50 MCG tablet TAKE 1 TABLET (50 MCG TOTAL) BY MOUTH DAILY. 12/23/16  Yes Renato Shin, MD  lisinopril-hydrochlorothiazide [facility]) 10-12.5 MG tablet  03/14/15  Yes [provider]  metFORMIN (GLUCOPHAGE) 500 MG tablet Take 1 tablet by mouth once daily    Yes [provider]  montelukast (SINGULAIR) 10 MG tablet  03/25/14  Yes [provider]  Multiple Vitamin (MULTIVITAMIN) tablet Take 1 tablet by mouth daily.   Yes [provider]     Family History  Problem Relation Age of Onset  . Goiter Maternal Grandmother   . Stroke Father 33  Social History   Socioeconomic History  . Marital status: Single    Spouse name: Not on file  . Number of children: Not on file  . Years of education: Not on file  . Highest education level: Not on file  Social Needs  . Financial resource strain: Not on file  . Food insecurity - worry: Not on file  . Food insecurity - inability: Not on file  . Transportation needs - medical: Not on file  . Transportation needs - non-medical: Not on file  Occupational History  . Occupation: Chiropodist    Comment: works in Chiropodist  for [employer]  Tobacco Use  . Smoking status: Former Smoker    Last attempt to quit: 05/13/1990    Years since quitting: 26.9  . Smokeless tobacco: Never Used  Substance and Sexual Activity  . Alcohol use: No    Alcohol/week: 0.0 oz  . Drug use: No  . Sexual activity: Not on file  Other Topics Concern  . Not on file  Social History Narrative  . Not on file    Review of Systems: A 12 point ROS discussed and pertinent positives are indicated in the HPI above.  All other systems are negative.  Review of Systems  Vital Signs: BP (!) 159/66   Pulse 80   Temp 97.7 F (36.5 C) (Oral)   Resp 16   Ht 5' 5"  (1.651 m)   Wt 219 lb (99.3 kg)   SpO2 97%   BMI 36.44 kg/m   Physical Exam  Constitutional: She is oriented to person, place, and time. She appears well-developed and well-nourished. No distress.  HENT:  Head: Normocephalic and atraumatic.  Eyes: No scleral icterus.  Cardiovascular: Normal rate and regular rhythm.  Pulmonary/Chest: Effort normal and breath sounds normal.  Abdominal: Soft. She exhibits no distension. There is no tenderness.  Neurological: She is alert and oriented to person, place, and time.  Skin: Skin is warm and dry.  Psychiatric: She has a normal mood and affect. Her behavior is normal.  Nursing note and vitals reviewed.    Imaging: Mr Abdomen Wwo Contrast  Result Date: 04/23/2017 CLINICAL DATA:  Followup liver lesion. EXAM: MRI ABDOMEN WITHOUT AND WITH CONTRAST TECHNIQUE: Multiplanar multisequence MR imaging of the abdomen was performed both before and after the administration of intravenous contrast. CONTRAST:  60m EOVIST GADOXETATE DISODIUM 0.25 MOL/L IV SOLN COMPARISON:  10/22/2016 FINDINGS: Lower chest: No acute findings. Hepatobiliary: The liver is cirrhotic. Lesion within segment 7 is again noted. This is mildly T2 hyperintense. On T2 weighted imaging this measures 1.1 x 1.1 cm, image 20 of series 9. Previously 1.2 x 1.0 cm. On  postcontrast imaging this measures 1.4 x 1.6 cm, image 41 of series 502. Previously 1.7 x 1.3 cm. On phase 4 of the post-contrast images pseudo capsule appearance of this lesion is identified and on the 20 minutes delayed images there is washout of contrast. Tiny cyst in left lobe of liver is stable. Mild hepatic steatosis. Previous cholecystectomy. No biliary ductal dilatation. Pancreas: No mass, inflammatory changes, or other parenchymal abnormality identified. Spleen:  Splenomegaly.  14.9 cm cranial caudal. Adrenals/Urinary Tract: The adrenal glands are normal. No kidney mass or hydronephrosis. Stomach/Bowel: Visualized portions within the abdomen are unremarkable. Vascular/Lymphatic:  Patent portal and hepatic veins. Other:  Small volume of perihepatic ascites is noted. Musculoskeletal: No suspicious bone lesions identified. IMPRESSION: 1. Stable appearance of segment 7 liver lesion. Highly suspicious for hepatocellular carcinoma. 2. Cirrhosis,  mild hepatic steatosis and splenomegaly. Electronically Signed   By: Kerby Moors M.D.   On: 04/23/2017 10:57    Labs:  CBC: Recent Labs    06/03/16 0931 12/02/16 0948 04/18/17 1210  WBC 3.5* 3.8* 3.1*  HGB 13.1 13.2 13.0  HCT 38.1 38.8 37.8  PLT 80* 79* 91*    COAGS: Recent Labs    06/03/16 0931 04/18/17 1210  INR 1.00* 1.1    BMP: Recent Labs    06/03/16 0931 12/02/16 0949 04/18/17 1210  NA 139 140 140  K 3.7 4.4 4.0  CL  --   --  105  CO2 25 24 31   GLUCOSE 173* 151* 102  BUN 9.1 9.8 9  CALCIUM 9.2 8.7 9.1  CREATININE 0.7 0.7 0.61  GFRNONAA  --   --  87  GFRAA  --   --  101    LIVER FUNCTION TESTS: Recent Labs    06/03/16 0931 12/02/16 0949 04/18/17 1210  BILITOT 0.70 0.60 0.5  AST 33 34 34  ALT 30 29 23   ALKPHOS 65 62  --   PROT 6.4 6.3* 6.3  ALBUMIN 3.5 3.5  --     TUMOR MARKERS: Recent Labs    04/18/17 1210  AFPTM 5.4    Assessment and Plan:  Mrs. Bettcher continues to do well. The imaging  characteristics of her small 1.6 cm lesion remain highly suspicious for hepatocellular carcinoma, however the lesion remains incredibly stable over the past 6 months and shows only minimal growth over the past 12 months. We have attempted biopsy in the past and, unfortunately this very small nodule blends incompletely with the background cirrhotic liver.  At this point, I think we will continue to offer her a conservative strategy of surveillance every 6 months. If this lesion does declare itself by interval growth, we will likely pursue treatment at that time.  Additionally, she has mild leukopenia. Her white blood cell count was 3.1 today down from 3.8 at the previous visit. I will also continue to monitor this. She has another blood draw in April 2019 with her primary care physician so we will have another value then.   1.)  return clinic visit in 6 months with repeat abdominal MRI with Eovist and liver labs including AFP.   Electronically Signed: Reynolds, [place] 04/29/2017, 10:02 AM   I spent a total of  15 Minutes in face to face in clinical consultation, greater than 50% of which was counseling/coordinating care for liver lesion.

## 2017-06-03 NOTE — Progress Notes (Signed)
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 Laurence Ferrari)  Current Treatment: observation and further evaluation  INTERVAL HISTORY:  Patient is here for follow-up regarding her thrombocytopenia and follow-up regarding her concerning liver lesion - likely low grade HCC. Her last visit was 12/02/16. The pt reports that she is doing well overall and enjoyed quiet holidays at home with her large [place] and cats.   Of note since the patient last visit, pt has had and MRI completed on 04/23/17 with results revealing: stable appearance of segment 7 liver lesion. Highly suspicious for hepatocellular carcinoma. Cirrhosis, mild hepatic steatosis and splenomegaly. Patient is following with interventional radiology Dr. Laurence Ferrari and had an MRI of the abdomen with and without contrast on 10/22/2016 minimal interval enlargement of segment 7 liver lesion which is now 12 by 10 mm versus 10 x 9 mm on the prior imaging study. This is being monitored by MRI liver q18month per IR and will be considered for ablation of nodule if there is further change.  Lab results today (06/04/17) of CBC, CMP, and Reticulocytes is as follows: all values are WNL except for WBC at 3.1, platelets at 87k . AFP tumor marker stable at 6.9. Pt reports taking metformin 5084mfor her well controlled diabetes.   On review of systems, pt reports no new symptoms and denies abdominal pains and leg swelling.    REVIEW OF SYSTEMS:    10 Point review of systems of done and is negative except as noted above.  . Past Medical History:    Diagnosis Date  . ALLERGIC RHINITIS 01/12/2008  . Colon polyps    hyperplastic  . Depression   . Diabetes ([place]  . Fatty liver   . GOITER, MULTINODULAR 01/12/2008  . Hepatic cirrhosis ([facility]  . Hyperlipidemia   . Hypertension   . HYPOTHYROIDISM 09/07/2008  . 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 Splenomegaly   . Thrombocytopenia ([facility]   Appears chronic from at least 2014  . Vitamin D deficiency     . Past Surgical History:  Procedure Laterality Date  . arthroscopic left knee  2005  . BREAST SURGERY  2008   Breast Biopsy   . CHOLECYSTECTOMY  1985  . IR GENERIC HISTORICAL  04/02/2016   IR RADIOLOGIST EVAL & MGMT 04/02/2016 HeJacqulynn CadetMD [facility] INTER[facility] RAD  . IR RADIOLOGIST EVAL & MGMT  10/29/2016  . IR RADIOLOGIST EVAL & MGMT  04/29/2017    . Social History   Tobacco Use  . Smoking status: Former Smoker    Last attempt to quit: 05/13/1990    Years since quitting: 27.0  . Smokeless tobacco: Never Used  Substance Use Topics  . Alcohol use: No    Alcohol/week: 0.0 oz  . Drug use: No    ALLERGIES:  has No Known Allergies.  MEDICATIONS:  Current Outpatient Medications  Medication Sig Dispense Refill  . alendronate (FOSAMAX) 70 MG tablet     . cholecalciferol (VITAMIN D) 1000 UNITS tablet Take 2,000 Units by mouth  daily.    . fluticasone (FLONASE) 50 MCG/ACT nasal spray Place into both nostrils daily.    . Lactobacillus (PROBIOTIC ACIDOPHILUS) CAPS Take by mouth daily.    Marland Kitchen levothyroxine (SYNTHROID, LEVOTHROID) 50 MCG tablet TAKE 1 TABLET (50 MCG TOTAL) BY MOUTH DAILY. 90 tablet 3  . lisinopril-hydrochlorothiazide (PRINZIDE,ZESTORETIC) 10-12.5 MG tablet     . metFORMIN (GLUCOPHAGE) 500 MG tablet Take 1 tablet by mouth once daily     . montelukast (SINGULAIR) 10 MG tablet     . Multiple Vitamin (MULTIVITAMIN) tablet Take 1 tablet by mouth daily.     No current facility-administered medications for this visit.      PHYSICAL EXAMINATION: ECOG PERFORMANCE STATUS: 1 - Symptomatic but completely ambulatory  . Vitals:   06/04/17 0926  BP: 137/64  Pulse: 64  Resp: 18  Temp: 98.1 F (36.7 C)  SpO2: 98%    Filed Weights   06/04/17 0926  Weight: 226 lb 11.2 oz (102.8 kg)   .Body mass index is 37.72 kg/m.  GENERAL:alert, in no acute distress and comfortable SKIN: skin color, texture, turgor are normal, no rashes or significant lesions EYES: normal, conjunctiva are pink and non-injected, sclera clear OROPHARYNX:no exudate, no erythema and lips, buccal mucosa, and tongue normal  NECK: supple, no JVD, thyroid normal size, non-tender, without nodularity LYMPH:  no palpable lymphadenopathy in the cervical, axillary or inguinal LUNGS: clear to auscultation with normal respiratory effort HEART: regular rate & rhythm,  no murmurs and no lower extremity edema ABDOMEN: abdomen soft, non-tender, normoactive bowel sounds ,No palpable hepatosplenomegaly Musculoskeletal: no cyanosis of digits and no clubbing  PSYCH: alert & oriented x 3 with fluent speech NEURO: no focal motor/sensory deficits  LABORATORY DATA:   I have reviewed the data as listed  . CBC Latest Ref Rng & Units 06/04/2017 04/18/2017 12/02/2016  WBC 3.9 - 10.3 K/uL 3.1(L) 3.1(L) 3.8(L)  Hemoglobin 11.7 - 15.5 g/dL - 13.0 13.2  Hematocrit 34.8 - 46.6 % 38.6 37.8 38.8  Platelets 145 - 400 K/uL 87(L) 91(L) 79(L)    . CMP Latest Ref Rng & Units 06/04/2017 04/18/2017 12/02/2016  Glucose 70 - 140 mg/dL 200(H) 102 151(H)  BUN 7 - 26 mg/dL 10 9 9.8  Creatinine 0.60 - 1.10 mg/dL 0.78 0.61 0.7  Sodium 136 - 145 mmol/L 140 140 140  Potassium 3.3 - 4.7 mmol/L 3.9 4.0 4.4  Chloride 98 - 109 mmol/L 104 105 -  CO2 22 - 29 mmol/L 29 31 24   Calcium 8.4 - 10.4 mg/dL 8.9 9.1 8.7  Total Protein 6.4 - 8.3 g/dL 6.6 6.3 6.3(L)  Total Bilirubin 0.2 - 1.2 mg/dL 0.4 0.5 0.60  Alkaline Phos 40 - 150 U/L 65 - 62  AST 5 - 34 U/L 31 34 34  ALT 0 - 55 U/L  26 23 29      RADIOLOGY  MRI abd w and wo contrast 04/23/17 stable appearance of segment 7 liver lesion. Highly suspicious for hepatocellular carcinoma. Cirrhosis, mild hepatic steatosis and splenomegaly.   MRI abd w and wo contrast 10/22/2016 CLINICAL DATA:  Followup of liver lesions. Nonalcoholic steatohepatitis induced cirrhosis. Thrombocytopenia.  EXAM: MRI ABDOMEN WITHOUT AND WITH CONTRAST  TECHNIQUE: Multiplanar multisequence MR imaging of the abdomen was performed both before and after the administration of intravenous contrast.  CONTRAST:  77m EOVIST GADOXETATE DISODIUM 0.25 MOL/L IV SOLN  COMPARISON:  MRI of 02/27/2016.  Clinic note of 04/02/2016.  FINDINGS: Motion degradation involves the arterial phase post-contrast images primarily.  Lower  chest: Normal heart size without pericardial or pleural effusion.  Hepatobiliary: Moderate to marked cirrhosis. Segment 7 liver lesion has undergone mild interval enlargement. For example, on T2 weighted imaging, measures 12 x 10 mm (image 13/series 7) versus 10 x 9 mm on the prior. On post-contrast image 27/ series 502, measures 17 x 13 mm today versus 14 x 10 mm on the prior (when remeasured). Demonstrates arterial and portal venous phase hyper enhancement with relative isointensity on more delayed post-contrast imaging.  Left hepatic lobe tiny lesion is likely a cyst and is similar.  Mild hepatic steatosis. Cholecystectomy, without biliary ductal dilatation.  Pancreas:  Normal, without mass or ductal dilatation.  Spleen:  Mild splenomegaly, 14.3 cm craniocaudal.  Adrenals/Urinary Tract: Normal adrenal glands. Normal kidneys, without hydronephrosis.  Stomach/Bowel: Normal stomach and abdominal bowel loops.  Vascular/Lymphatic: Patent hepatic and portal veins. No specific evidence of portal venous hypertension. Circumaortic left renal vein. No retroperitoneal or retrocrural adenopathy.  Other:   Small volume perihepatic ascites is unchanged.  Musculoskeletal: No acute osseous abnormality.  IMPRESSION: 1. Mild interval enlargement of a segment 7 liver lesion, highly suspicious for hepatocellular carcinoma. 2. Cirrhosis, mild hepatic steatosis, and mild splenomegaly.   Electronically Signed   By: Abigail Miyamoto M.D.   On: 10/22/2016 10:22    ASSESSMENT & PLAN:   #1 Chronic thrombocytopenia. Platelet counts today are relatively stable at 87k (were 79k -6 months ago) Likely related to cirrhosis related to NASH with mild splenomegaly.  B12, TSH, LDH within normal limits. 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 Recent EGD showed no evidence of varices. Subtle changes of portal hypertensive gastropathy. Plan -Pts platelet count stable at 87k, liver functions are stable as well -Counseled pt to continue f/u with her PCP Leighton Ruff, MD  -Pt to return to clinic in 6 months with labs and most recent MRI results.  No other intervention indicated at this time. -The platelets are less than 30k or if they're less than 50k  and with bleeding issues -would consider the use of Nplate. -Continue liver related cares with Dr. Henrene Pastor --liver cirrhosis mx per GI and PCP   #3  Lesion in the RIGHT hepatic lobe has imaging characteristics highly suspicious for hepatic cellular carcinoma including arterial enhancement and a new peripheral rim.  Plan --Discussed pt labwork today and MRI results from 04/23/17  -MRI results do not indicate a significant change in the liver nodule and her AFP tumor marker remains stable. -She continues to follow-up with Laurence Ferrari (IR)- to determine need for ablation of the liver lesion. He did not deem necessary at this time. -Patient notes that that is a plan to repeat an MRI with IR in 6 months from her previous one .  RTC with Dr Irene Limbo in 6 months with  labs  Continue follow-up with primary care physician.  I spent 15 minutes counseling the patient face to face. The total time spent in the appointment was 25 minutes and more than 50% was on counseling and direct patient cares.    Sullivan Lone MD [place] AAHIVMS [facility] Hematology/Oncology Physician [place] [facility]  (Office):       [phone] (Work cell):  [phone] (Fax):           [phone]  This document serves as a record of services personally performed by Sullivan Lone, MD. It was created on his behalf by Baldwin [place], a trained  medical scribe. The creation of this record is based on the scribe's personal observations and the provider's statements to them.   .I have reviewed the above documentation for accuracy and completeness, and I agree with the above. Brunetta Genera MD MS

## 2017-06-04 ENCOUNTER — Telehealth: Payer: Self-pay | Admitting: Hematology

## 2017-06-04 ENCOUNTER — Inpatient Hospital Stay: Payer: Medicare Other

## 2017-06-04 ENCOUNTER — Inpatient Hospital Stay: Payer: Medicare Other | Attending: Hematology | Admitting: Hematology

## 2017-06-04 ENCOUNTER — Encounter: Payer: Self-pay | Admitting: Hematology

## 2017-06-04 VITALS — BP 137/64 | HR 64 | Temp 98.1°F | Resp 18 | Ht 65.0 in | Wt 226.7 lb

## 2017-06-04 DIAGNOSIS — D696 Thrombocytopenia, unspecified: Secondary | ICD-10-CM

## 2017-06-04 DIAGNOSIS — I10 Essential (primary) hypertension: Secondary | ICD-10-CM | POA: Diagnosis not present

## 2017-06-04 DIAGNOSIS — C228 Malignant neoplasm of liver, primary, unspecified as to type: Secondary | ICD-10-CM

## 2017-06-04 DIAGNOSIS — K7581 Nonalcoholic steatohepatitis (NASH): Secondary | ICD-10-CM | POA: Diagnosis not present

## 2017-06-04 DIAGNOSIS — C220 Liver cell carcinoma: Secondary | ICD-10-CM

## 2017-06-04 DIAGNOSIS — Z86010 Personal history of colonic polyps: Secondary | ICD-10-CM | POA: Diagnosis not present

## 2017-06-04 DIAGNOSIS — K7460 Unspecified cirrhosis of liver: Secondary | ICD-10-CM | POA: Insufficient documentation

## 2017-06-04 DIAGNOSIS — F329 Major depressive disorder, single episode, unspecified: Secondary | ICD-10-CM | POA: Diagnosis not present

## 2017-06-04 DIAGNOSIS — Z87891 Personal history of nicotine dependence: Secondary | ICD-10-CM | POA: Diagnosis not present

## 2017-06-04 DIAGNOSIS — Z7984 Long term (current) use of oral hypoglycemic drugs: Secondary | ICD-10-CM | POA: Insufficient documentation

## 2017-06-04 DIAGNOSIS — E559 Vitamin D deficiency, unspecified: Secondary | ICD-10-CM

## 2017-06-04 DIAGNOSIS — D731 Hypersplenism: Secondary | ICD-10-CM | POA: Diagnosis not present

## 2017-06-04 DIAGNOSIS — E785 Hyperlipidemia, unspecified: Secondary | ICD-10-CM | POA: Diagnosis not present

## 2017-06-04 DIAGNOSIS — E039 Hypothyroidism, unspecified: Secondary | ICD-10-CM | POA: Insufficient documentation

## 2017-06-04 DIAGNOSIS — E119 Type 2 diabetes mellitus without complications: Secondary | ICD-10-CM | POA: Insufficient documentation

## 2017-06-04 LAB — COMPREHENSIVE METABOLIC PANEL
ALK PHOS: 65 U/L (ref 40–150)
ALT: 26 U/L (ref 0–55)
ANION GAP: 7 (ref 3–11)
AST: 31 U/L (ref 5–34)
Albumin: 3.6 g/dL (ref 3.5–5.0)
BUN: 10 mg/dL (ref 7–26)
CALCIUM: 8.9 mg/dL (ref 8.4–10.4)
CO2: 29 mmol/L (ref 22–29)
CREATININE: 0.78 mg/dL (ref 0.60–1.10)
Chloride: 104 mmol/L (ref 98–109)
Glucose, Bld: 200 mg/dL — ABNORMAL HIGH (ref 70–140)
Potassium: 3.9 mmol/L (ref 3.3–4.7)
SODIUM: 140 mmol/L (ref 136–145)
Total Bilirubin: 0.4 mg/dL (ref 0.2–1.2)
Total Protein: 6.6 g/dL (ref 6.4–8.3)

## 2017-06-04 LAB — RETICULOCYTES
RBC.: 4.24 MIL/uL (ref 3.70–5.45)
RETIC COUNT ABSOLUTE: 63.6 10*3/uL (ref 33.7–90.7)
RETIC CT PCT: 1.5 % (ref 0.7–2.1)

## 2017-06-04 LAB — CBC WITH DIFFERENTIAL (CANCER CENTER ONLY)
BASOS ABS: 0 10*3/uL (ref 0.0–0.1)
BASOS PCT: 0 %
EOS ABS: 0.1 10*3/uL (ref 0.0–0.5)
Eosinophils Relative: 3 %
HEMATOCRIT: 38.6 % (ref 34.8–46.6)
HEMOGLOBIN: 12.9 g/dL (ref 11.6–15.9)
Lymphocytes Relative: 30 %
Lymphs Abs: 0.9 10*3/uL (ref 0.9–3.3)
MCH: 30.4 pg (ref 25.1–34.0)
MCHC: 33.4 g/dL (ref 31.5–36.0)
MCV: 91 fL (ref 79.5–101.0)
Monocytes Absolute: 0.2 10*3/uL (ref 0.1–0.9)
Monocytes Relative: 7 %
NEUTROS ABS: 1.9 10*3/uL (ref 1.5–6.5)
Neutrophils Relative %: 60 %
Platelet Count: 87 10*3/uL — ABNORMAL LOW (ref 145–400)
RBC: 4.24 MIL/uL (ref 3.70–5.45)
RDW: 13.4 % (ref 11.2–16.1)
WBC: 3.1 10*3/uL — AB (ref 3.9–10.3)

## 2017-06-04 NOTE — Telephone Encounter (Signed)
Gave patient avs report and appointments for July.  °

## 2017-06-04 NOTE — Patient Instructions (Signed)
Thank you for choosing [facility] to provide your oncology and hematology care.  To afford each patient quality time with our providers, please arrive 30 minutes before your scheduled appointment time.  If you arrive late for your appointment, you may be asked to reschedule.  We strive to give you quality time with our providers, and arriving late affects you and other patients whose appointments are after yours.   If you are a no show for multiple scheduled visits, you may be dismissed from the clinic at the providers discretion.    Again, thank you for choosing [facility], our hope is that these requests will decrease the amount of time that you wait before being seen by our physicians.  ______________________________________________________________________  Should you have questions after your visit to the [facility], please contact our office at [phone] between the hours of 8:30 and 4:30 p.m.    Voicemails left after 4:30p.m will not be returned until the following business day.    For prescription refill requests, please have your pharmacy contact us directly.  Please also try to allow 48 hours for prescription requests.    Please contact the scheduling department for questions regarding scheduling.  For scheduling of procedures such as PET scans, CT scans, MRI, Ultrasound, etc please contact central scheduling at [phone].    Resources For Cancer Patients and Caregivers:   Oncolink.org:  A wonderful resource for patients and healthcare providers for information regarding your disease, ways to tract your treatment, what to expect, etc.     American Cancer Society:  800-227-2345  Can help patients locate various types of support and financial assistance  Cancer Care: 1-800-813-HOPE (4673) Provides financial assistance, online support groups, medication/co-pay assistance.    [place] DSS:  336-641-3447 Where to apply for food  stamps, Medicaid, and utility assistance  Medicare Rights Center: 800-333-4114 Helps people with Medicare understand their rights and benefits, navigate the Medicare system, and secure the quality healthcare they deserve  SCAT: 336-333-6589 [place] [employer]'s shared-ride transportation service for eligible riders who have a disability that prevents them from riding the fixed route bus.    For additional information on assistance programs please contact our social worker:   Grier Hock/Abigail Elmore:  [phone]            

## 2017-06-05 LAB — AFP TUMOR MARKER: AFP, SERUM, TUMOR MARKER: 6.9 ng/mL (ref 0.0–8.3)

## 2017-06-20 ENCOUNTER — Ambulatory Visit: Payer: Medicare Other | Admitting: Endocrinology

## 2017-07-09 ENCOUNTER — Ambulatory Visit (INDEPENDENT_AMBULATORY_CARE_PROVIDER_SITE_OTHER): Payer: Medicare Other | Admitting: Endocrinology

## 2017-07-09 ENCOUNTER — Encounter: Payer: Self-pay | Admitting: Endocrinology

## 2017-07-09 VITALS — BP 116/78 | HR 68 | Ht 65.0 in | Wt 224.4 lb

## 2017-07-09 DIAGNOSIS — E042 Nontoxic multinodular goiter: Secondary | ICD-10-CM

## 2017-07-09 LAB — T4, FREE: Free T4: 0.93 ng/dL (ref 0.60–1.60)

## 2017-07-09 LAB — TSH: TSH: 1.32 u[IU]/mL (ref 0.35–4.50)

## 2017-07-09 NOTE — Progress Notes (Signed)
Subjective:    Patient ID: Crystal Barnett, female    DOB: [birth year], 77 y.o.   MRN: [id number]  HPI pt returns for f/u of multinodular goiter (dx'ed 2009, when bilat bxs were benign in 2009; the cytology did not show chronic thyroiditis, but she has chronic hypothyroidism; she has been on synthroid since 2009; f/u US in 2016 was unchanged).  She notices the goiter, but says there has been no change.  Past Medical History:  Diagnosis Date  . ALLERGIC RHINITIS 01/12/2008  . Colon polyps    hyperplastic  . Depression   . Diabetes ([place])   . Fatty liver   . GOITER, MULTINODULAR 01/12/2008  . Hepatic cirrhosis ([place])   . Hyperlipidemia   . Hypertension   . HYPOTHYROIDISM 09/07/2008  . 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 Splenomegaly   . Thrombocytopenia ([place])    Appears chronic from at least 2014  . Vitamin D deficiency     Past Surgical History:  Procedure Laterality Date  . arthroscopic left knee  2005  . BREAST SURGERY  2008   Breast Biopsy   . CHOLECYSTECTOMY  1985  . IR GENERIC HISTORICAL  04/02/2016   IR RADIOLOGIST EVAL & MGMT 04/02/2016 Jacqulynn Cadet, MD [facility] INTERV RAD  . IR RADIOLOGIST EVAL & MGMT  10/29/2016  . IR RADIOLOGIST EVAL & MGMT  04/29/2017    Social History   Socioeconomic History  . Marital status: Single    Spouse name: Not on file  . Number of children: Not on file  . Years of education: Not on file  . Highest education level: Not on file  Social Needs  . Financial resource strain: Not on file  . Food insecurity - worry: Not on file  . Food insecurity - inability: Not on file  . Transportation needs - medical: Not on file  . Transportation needs - non-medical: Not on file  Occupational History  . Occupation: Chiropodist    Comment: works in Chiropodist for [employer]  Tobacco Use  . Smoking status: Former Smoker    Last attempt to quit: 05/13/1990    Years since quitting: 27.1    . Smokeless tobacco: Never Used  Substance and Sexual Activity  . Alcohol use: No    Alcohol/week: 0.0 oz  . Drug use: No  . Sexual activity: Not on file  Other Topics Concern  . Not on file  Social History Narrative  . Not on file    Current Outpatient Medications on File Prior to Visit  Medication Sig Dispense Refill  . alendronate (FOSAMAX) 70 MG tablet     . cholecalciferol (VITAMIN D) 1000 UNITS tablet Take 2,000 Units by mouth daily.    . fluticasone (FLONASE) 50 MCG/ACT nasal spray Place into both nostrils daily.    . Lactobacillus (PROBIOTIC ACIDOPHILUS) CAPS Take by mouth daily.    Marland Kitchen levothyroxine (SYNTHROID, LEVOTHROID) 50 MCG tablet TAKE 1 TABLET (50 MCG TOTAL) BY MOUTH DAILY. 90 tablet 3  . lisinopril-hydrochlorothiazide (PRINZIDE,ZESTORETIC) 10-12.5 MG tablet     . metFORMIN (GLUCOPHAGE) 500 MG tablet Take 1 tablet by mouth once daily     . montelukast (SINGULAIR) 10 MG tablet     . Multiple Vitamin (MULTIVITAMIN) tablet Take 1 tablet by mouth daily.     No current facility-administered medications on file prior to visit.     No Known Allergies  Family  History  Problem Relation Age of Onset  . Goiter Maternal Grandmother   . Stroke Father 80    BP 116/78 (BP Location: Left Arm, Patient Position: Sitting, Cuff Size: Large)   Pulse 68   Ht 5' 5"  (1.651 m)   Wt 224 lb 6.4 oz (101.8 kg)   SpO2 97%   BMI 37.34 kg/m    Review of Systems Denies sob.    Objective:   Physical Exam VITAL SIGNS:  See vs page GENERAL: no distress Neck: thyroid is 5-10 times normal size (R>L), with multinodular surface.        Assessment & Plan:  Multinodular goiter, due for recheck.   Patient Instructions  A thyroid blood test is requested for you today.  We'll let you know about the results. Let's recheck the ultrasound.  you will receive a phone call, about a day and time for an appointment. Please return in 1 year.

## 2017-07-09 NOTE — Patient Instructions (Addendum)
A thyroid blood test is requested for you today.  We'll let you know about the results. Let's recheck the ultrasound.  you will receive a phone call, about a day and time for an appointment. Please return in 1 year.

## 2017-07-18 ENCOUNTER — Ambulatory Visit
Admission: RE | Admit: 2017-07-18 | Discharge: 2017-07-18 | Disposition: A | Discharge status: Home / Self Care | Payer: Medicare Other | Source: Ambulatory Visit | Attending: Endocrinology | Admitting: Endocrinology

## 2017-07-18 DIAGNOSIS — E042 Nontoxic multinodular goiter: Secondary | ICD-10-CM | POA: Diagnosis not present

## 2017-07-30 ENCOUNTER — Encounter: Payer: Self-pay | Admitting: Radiology

## 2017-08-25 ENCOUNTER — Encounter: Payer: Self-pay | Admitting: Internal Medicine

## 2017-08-28 ENCOUNTER — Ambulatory Visit (INDEPENDENT_AMBULATORY_CARE_PROVIDER_SITE_OTHER): Payer: Medicare Other | Admitting: Endocrinology

## 2017-08-28 ENCOUNTER — Encounter: Payer: Self-pay | Admitting: Endocrinology

## 2017-08-28 ENCOUNTER — Other Ambulatory Visit (HOSPITAL_COMMUNITY)
Admission: RE | Admit: 2017-08-28 | Discharge: 2017-08-28 | Disposition: A | Discharge status: Home / Self Care | Payer: Medicare Other | Source: Ambulatory Visit | Attending: Endocrinology | Admitting: Endocrinology

## 2017-08-28 VITALS — BP 134/78 | HR 73 | Resp 16 | Wt 228.8 lb

## 2017-08-28 DIAGNOSIS — E042 Nontoxic multinodular goiter: Secondary | ICD-10-CM | POA: Insufficient documentation

## 2017-08-28 DIAGNOSIS — E041 Nontoxic single thyroid nodule: Secondary | ICD-10-CM | POA: Diagnosis not present

## 2017-08-28 NOTE — Patient Instructions (Signed)
We'll let you know about the biopsy results. Please come back for a follow-up appointment in 6 months

## 2017-08-28 NOTE — Progress Notes (Signed)
   Subjective:    Patient ID: Crystal Barnett, female    DOB: 02-14-1941, 77 y.o.   MRN: [id number]  HPI  bx only:   Review of Systems     Objective:   Physical Exam VITAL SIGNS:  See vs page GENERAL: no distress Neck: multinodular goiter: the mid-left nodule is easily palpable.     thyroid needle bx: consent obtained, signed form on chart The area is first sprayed with cooling agent local: xylocaine 2%, with epinephrine prep: alcohol pad 4 bxs are done with 25 and 21J needles no complications     Assessment & Plan:    Patient Instructions  We'll let you know about the biopsy results. Please come back for a follow-up appointment in 6 months

## 2017-09-07 IMAGING — MR MR ABDOMEN WO/W CM
8 of 17 series · 21 of 48 positions shown · IV contrast (eovist)
Comparison: Ultrasound 03/28/2015

CLINICAL DATA: Hepatic cirrhosis. Nonalcoholic steatohepatitis.
Left hepatic lobe lesion seen on recent ultrasound.

EXAM:
MRI ABDOMEN WITHOUT AND WITH CONTRAST
TECHNIQUE: Multiplanar multisequence MR imaging of the abdomen was performed
both before and after the administration of intravenous contrast.
CONTRAST:  10 mL Eovist

[Series 3: T2 · coronal · 5.0mm · 0.94mm/px · 3 of 59 slices shown (1 of 2)]
[im 1/59]
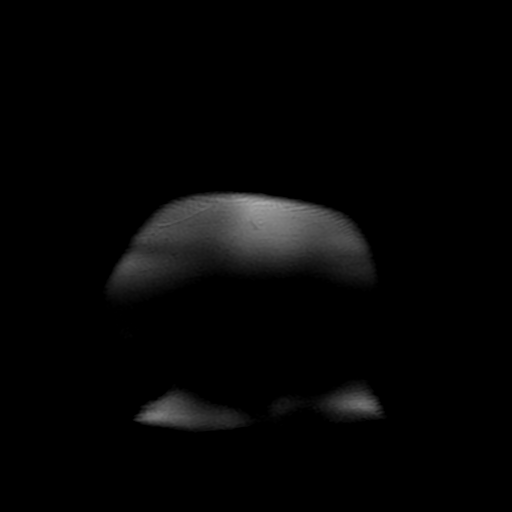
[im 30/59]
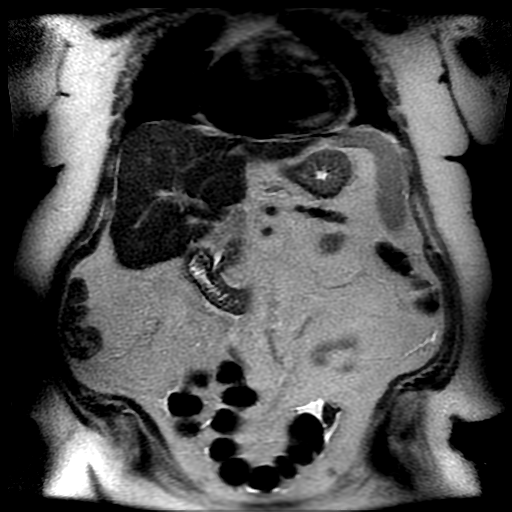
[im 59/59]
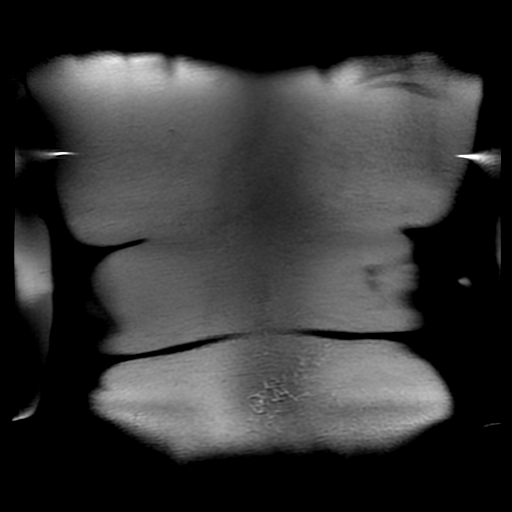

[Series 4: ax dualecho · axial · 5.0mm · 0.94mm/px · z∈[-96,+174]mm · 4 of 110 slices shown]
[im 1/110]
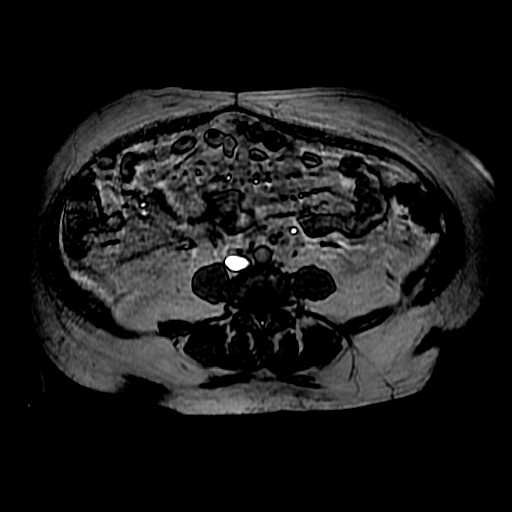
[im 37/110]
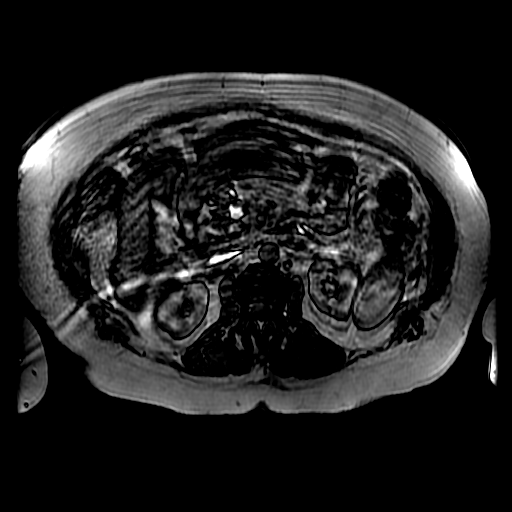
[im 73/110]
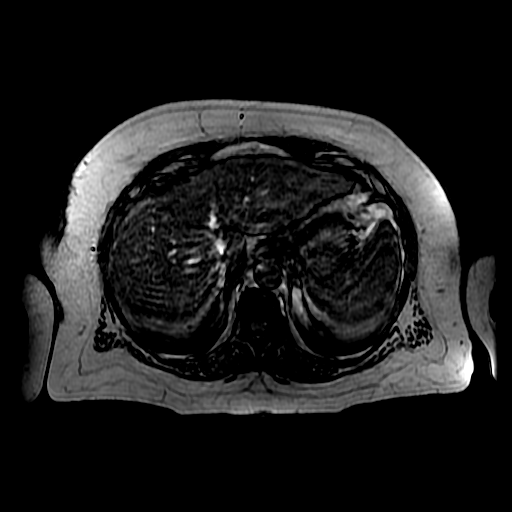
[im 110/110]
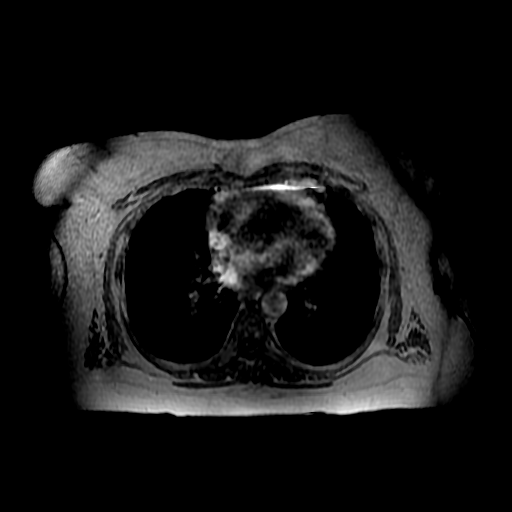

[Series 9: T2 · axial · 5.0mm · 0.94mm/px · z∈[-96,+174]mm · 2 of 55 slices shown (2 of 2)]
[im 1/55]
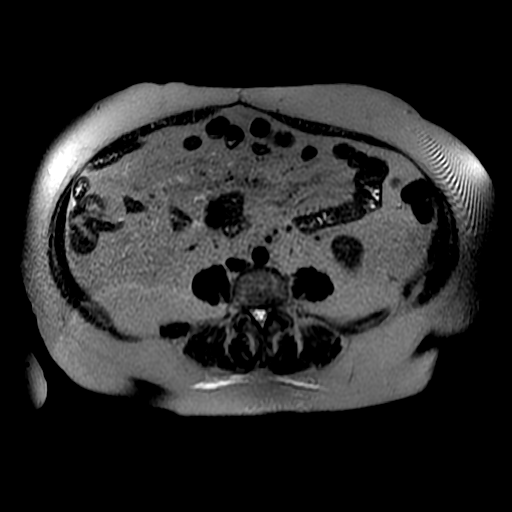
[im 55/55]
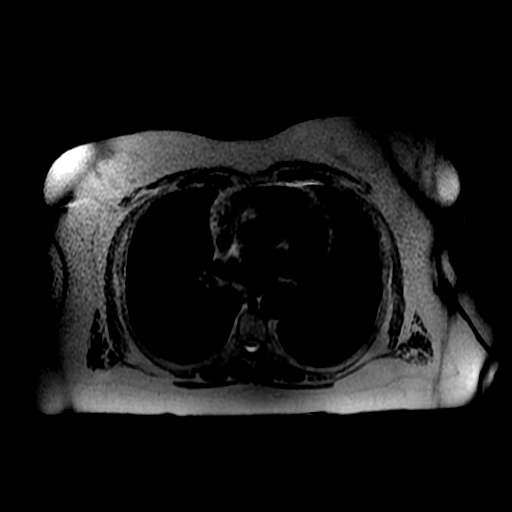

[Series 10: bSSFP fat-sat · axial · 5.0mm · 0.94mm/px · z∈[-96,+174]mm · 2 of 55 slices shown]
[im 1/55]
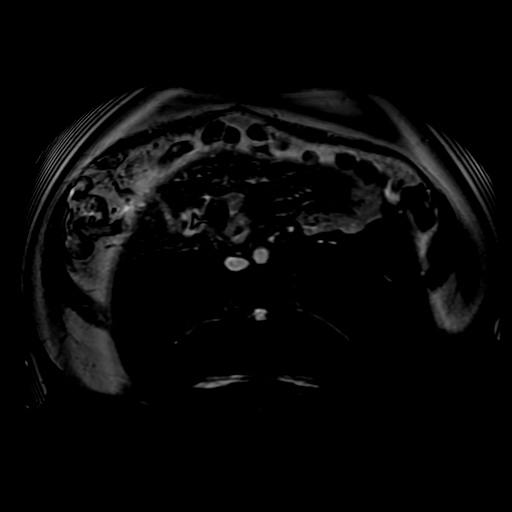
[im 55/55]
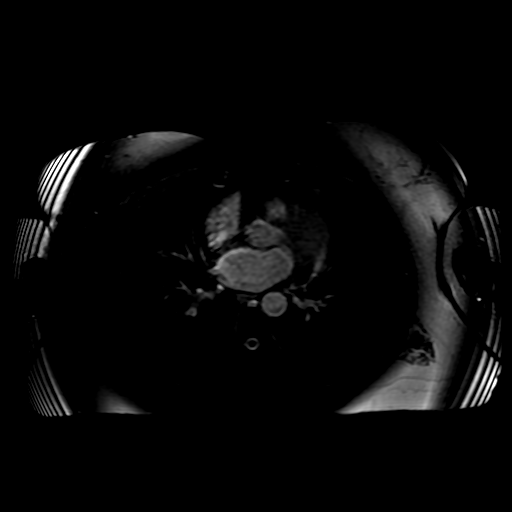

[Series 11: DWI b500 · axial · 6.0mm · 1.88mm/px · z∈[-110,+171]mm · 3 of 74 slices shown]
[im 1/74]
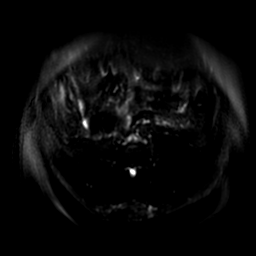
[im 37/74]
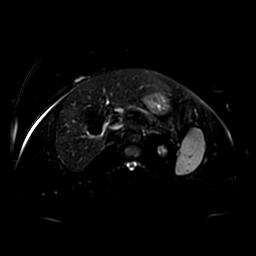
[im 74/74]
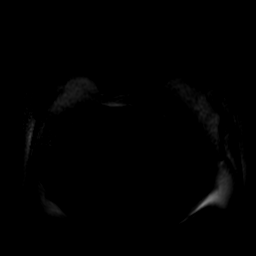

[Series 12: T1 dynamic · coronal · delayed · 10.0mm · 0.86mm/px · 3 of 72 slices shown (1 of 3)]
[im 1/72]
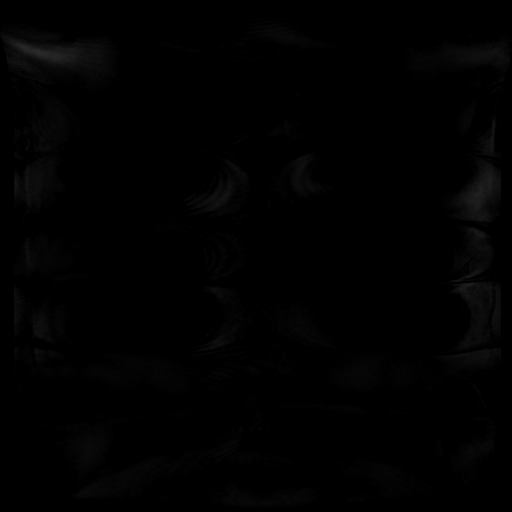
[im 36/72]
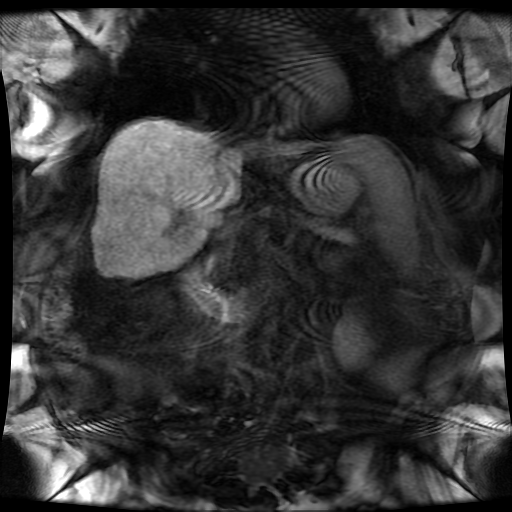
[im 72/72]
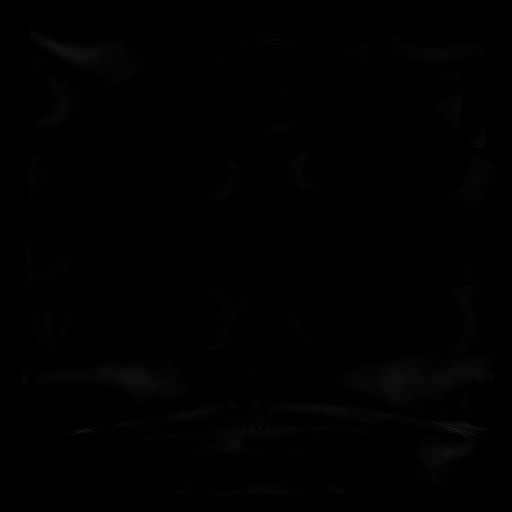

[Series 13: T1 dynamic · axial · delayed · 5.6mm · 0.78mm/px · z∈[-89,+166]mm · 3 of 92 slices shown (2 of 3)]
[im 1/92]
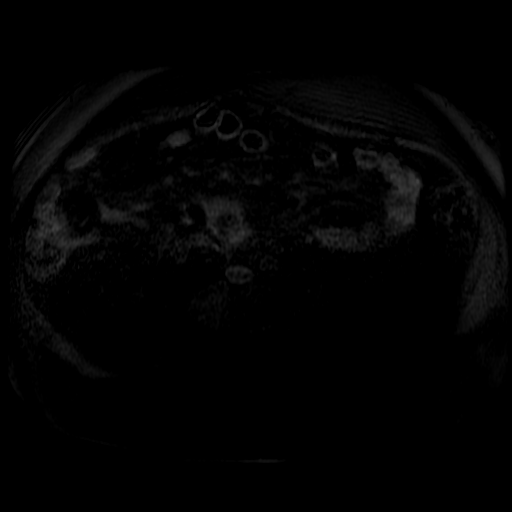
[im 46/92]
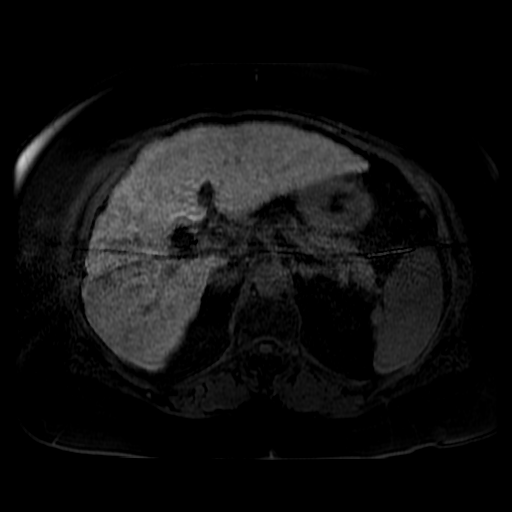
[im 92/92]
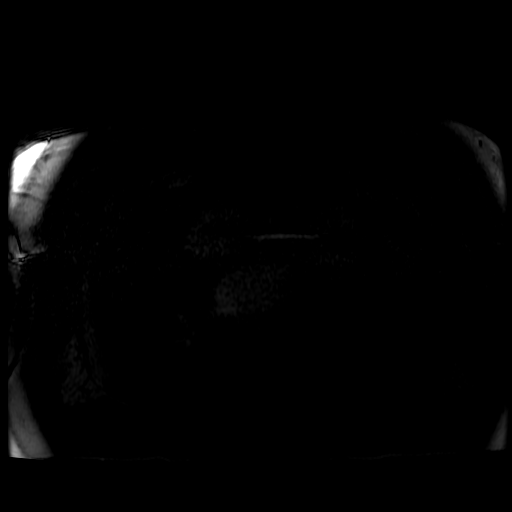

[Series 600: T1 dynamic · axial · 5.6mm · 0.78mm/px · 1 of 92 slices shown (3 of 3)]
[im 1/92]
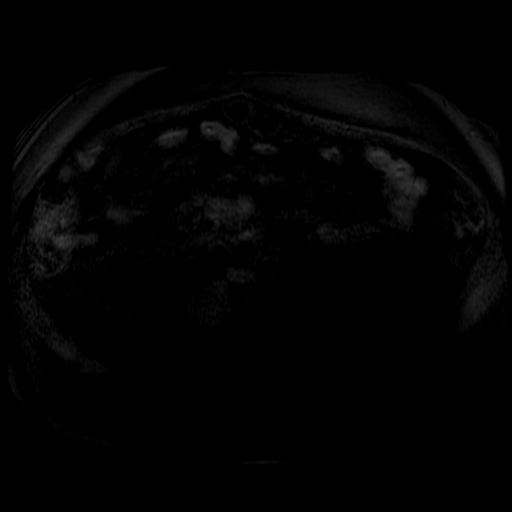

[21 of 48 positions shown; findings below may reference images not displayed]

FINDINGS: Lower chest:  No acute findings.

Hepatobiliary: Hepatic cirrhosis is demonstrated. A 1.2 cm
hypervascular lesion is seen in the posterior dome of the right
hepatic lobe, image 24/series 601. No definitive portal venous phase
washout or peripheral rim enhancement is seen, but this lesion does
show washout on 20 minute delayed phase, image 24 series 13.
Differential diagnosis includes small hepatocellular carcinoma and
dysplastic nodule. No other hepatic lesions are seen.

Prior cholecystectomy noted. No evidence of biliary ductal
dilatation.

Pancreas: No mass, inflammatory changes, or other parenchymal
abnormality identified.

Spleen:  Mild splenomegaly.

Adrenals/Urinary Tract: No masses identified. No evidence of
hydronephrosis.

Stomach/Bowel: Visualized portions within the abdomen are
unremarkable.

Vascular/Lymphatic: No pathologically enlarged lymph nodes
identified. No abdominal aortic aneurysm demonstrated.

Other:  Minimal ascites noted within the pelvis.

Musculoskeletal:  No suspicious bone lesions identified.
IMPRESSION: 1.2 cm hypervascular lesion in dome of the right hepatic lobe.
Differential diagnosis includes small hepatocellular carcinoma and
dysplastic nodule. Due to small size and location of this lesion,
recommend continued followup by MRI in 6 months.

Hepatic cirrhosis and mild splenomegaly.

## 2017-09-09 DIAGNOSIS — Z7984 Long term (current) use of oral hypoglycemic drugs: Secondary | ICD-10-CM | POA: Diagnosis not present

## 2017-09-09 DIAGNOSIS — D696 Thrombocytopenia, unspecified: Secondary | ICD-10-CM | POA: Diagnosis not present

## 2017-09-09 DIAGNOSIS — J302 Other seasonal allergic rhinitis: Secondary | ICD-10-CM | POA: Diagnosis not present

## 2017-09-09 DIAGNOSIS — E119 Type 2 diabetes mellitus without complications: Secondary | ICD-10-CM | POA: Diagnosis not present

## 2017-09-09 DIAGNOSIS — Z0000 Encounter for general adult medical examination without abnormal findings: Secondary | ICD-10-CM | POA: Diagnosis not present

## 2017-09-09 DIAGNOSIS — E039 Hypothyroidism, unspecified: Secondary | ICD-10-CM | POA: Diagnosis not present

## 2017-09-09 DIAGNOSIS — E7800 Pure hypercholesterolemia, unspecified: Secondary | ICD-10-CM | POA: Diagnosis not present

## 2017-09-09 DIAGNOSIS — E049 Nontoxic goiter, unspecified: Secondary | ICD-10-CM | POA: Diagnosis not present

## 2017-09-09 DIAGNOSIS — K7581 Nonalcoholic steatohepatitis (NASH): Secondary | ICD-10-CM | POA: Diagnosis not present

## 2017-09-09 DIAGNOSIS — I10 Essential (primary) hypertension: Secondary | ICD-10-CM | POA: Diagnosis not present

## 2017-09-09 DIAGNOSIS — M810 Age-related osteoporosis without current pathological fracture: Secondary | ICD-10-CM | POA: Diagnosis not present

## 2017-09-09 DIAGNOSIS — Z1389 Encounter for screening for other disorder: Secondary | ICD-10-CM | POA: Diagnosis not present

## 2017-09-09 DIAGNOSIS — E559 Vitamin D deficiency, unspecified: Secondary | ICD-10-CM | POA: Diagnosis not present

## 2017-09-19 DIAGNOSIS — L03031 Cellulitis of right toe: Secondary | ICD-10-CM | POA: Diagnosis not present

## 2017-09-19 DIAGNOSIS — M79674 Pain in right toe(s): Secondary | ICD-10-CM | POA: Diagnosis not present

## 2017-09-19 DIAGNOSIS — M2041 Other hammer toe(s) (acquired), right foot: Secondary | ICD-10-CM | POA: Diagnosis not present

## 2017-10-03 DIAGNOSIS — M79674 Pain in right toe(s): Secondary | ICD-10-CM | POA: Diagnosis not present

## 2017-10-03 DIAGNOSIS — M79675 Pain in left toe(s): Secondary | ICD-10-CM | POA: Diagnosis not present

## 2017-10-08 ENCOUNTER — Other Ambulatory Visit: Payer: Self-pay | Admitting: *Deleted

## 2017-10-08 ENCOUNTER — Other Ambulatory Visit (HOSPITAL_COMMUNITY): Payer: Self-pay | Admitting: Interventional Radiology

## 2017-10-08 DIAGNOSIS — K769 Liver disease, unspecified: Secondary | ICD-10-CM

## 2017-10-08 DIAGNOSIS — K7581 Nonalcoholic steatohepatitis (NASH): Secondary | ICD-10-CM

## 2017-10-08 DIAGNOSIS — K7460 Unspecified cirrhosis of liver: Secondary | ICD-10-CM

## 2017-10-23 ENCOUNTER — Telehealth: Payer: Self-pay

## 2017-10-23 ENCOUNTER — Inpatient Hospital Stay: Payer: Medicare Other | Attending: Hematology

## 2017-10-23 ENCOUNTER — Other Ambulatory Visit: Payer: Self-pay

## 2017-10-23 DIAGNOSIS — K769 Liver disease, unspecified: Secondary | ICD-10-CM | POA: Diagnosis not present

## 2017-10-23 DIAGNOSIS — R160 Hepatomegaly, not elsewhere classified: Secondary | ICD-10-CM | POA: Diagnosis not present

## 2017-10-23 DIAGNOSIS — D696 Thrombocytopenia, unspecified: Secondary | ICD-10-CM | POA: Insufficient documentation

## 2017-10-23 DIAGNOSIS — K7581 Nonalcoholic steatohepatitis (NASH): Secondary | ICD-10-CM | POA: Diagnosis not present

## 2017-10-23 LAB — CBC WITH DIFFERENTIAL (CANCER CENTER ONLY)
Basophils Absolute: 0 10*3/uL (ref 0.0–0.1)
Basophils Relative: 0 %
EOS ABS: 0.1 10*3/uL (ref 0.0–0.5)
Eosinophils Relative: 3 %
HCT: 39.5 % (ref 34.8–46.6)
HEMOGLOBIN: 13.2 g/dL (ref 11.6–15.9)
LYMPHS ABS: 1.1 10*3/uL (ref 0.9–3.3)
LYMPHS PCT: 25 %
MCH: 29.9 pg (ref 25.1–34.0)
MCHC: 33.4 g/dL (ref 31.5–36.0)
MCV: 89.6 fL (ref 79.5–101.0)
Monocytes Absolute: 0.4 10*3/uL (ref 0.1–0.9)
Monocytes Relative: 9 %
NEUTROS PCT: 63 %
Neutro Abs: 2.7 10*3/uL (ref 1.5–6.5)
Platelet Count: 111 10*3/uL — ABNORMAL LOW (ref 145–400)
RBC: 4.41 MIL/uL (ref 3.70–5.45)
RDW: 13.3 % (ref 11.2–14.5)
WBC Count: 4.3 10*3/uL (ref 3.9–10.3)

## 2017-10-23 LAB — CMP (CANCER CENTER ONLY)
ALK PHOS: 68 U/L (ref 40–150)
ALT: 16 U/L (ref 0–55)
AST: 28 U/L (ref 5–34)
Albumin: 3.7 g/dL (ref 3.5–5.0)
Anion gap: 10 (ref 3–11)
BILIRUBIN TOTAL: 0.7 mg/dL (ref 0.2–1.2)
BUN: 10 mg/dL (ref 7–26)
CALCIUM: 9.2 mg/dL (ref 8.4–10.4)
CO2: 26 mmol/L (ref 22–29)
CREATININE: 0.69 mg/dL (ref 0.60–1.10)
Chloride: 104 mmol/L (ref 98–109)
Glucose, Bld: 104 mg/dL (ref 70–140)
Potassium: 4 mmol/L (ref 3.5–5.1)
SODIUM: 140 mmol/L (ref 136–145)
TOTAL PROTEIN: 6.7 g/dL (ref 6.4–8.3)

## 2017-10-23 NOTE — Telephone Encounter (Signed)
Printed a calender of patient appointments that's coming up. She had a order for labs froom another provider. RN said to schedule the lab

## 2017-10-28 ENCOUNTER — Ambulatory Visit (HOSPITAL_COMMUNITY)
Admission: RE | Admit: 2017-10-28 | Discharge: 2017-10-28 | Disposition: A | Discharge status: Home / Self Care | Payer: Medicare Other | Source: Ambulatory Visit | Attending: Interventional Radiology | Admitting: Interventional Radiology

## 2017-10-28 DIAGNOSIS — R188 Other ascites: Secondary | ICD-10-CM | POA: Insufficient documentation

## 2017-10-28 DIAGNOSIS — K769 Liver disease, unspecified: Secondary | ICD-10-CM | POA: Diagnosis not present

## 2017-10-28 DIAGNOSIS — K7581 Nonalcoholic steatohepatitis (NASH): Secondary | ICD-10-CM | POA: Insufficient documentation

## 2017-10-28 DIAGNOSIS — K7460 Unspecified cirrhosis of liver: Secondary | ICD-10-CM | POA: Insufficient documentation

## 2017-10-28 DIAGNOSIS — R161 Splenomegaly, not elsewhere classified: Secondary | ICD-10-CM | POA: Diagnosis not present

## 2017-10-28 DIAGNOSIS — K766 Portal hypertension: Secondary | ICD-10-CM | POA: Diagnosis not present

## 2017-10-28 MED ORDER — GADOXETATE DISODIUM 0.25 MMOL/ML IV SOLN
10.0000 mL | Freq: Once | INTRAVENOUS | Status: AC | PRN
  Administered 2017-10-28: 8 mL via INTRAVENOUS

## 2017-11-04 ENCOUNTER — Encounter: Payer: Self-pay | Admitting: Radiology

## 2017-11-04 ENCOUNTER — Ambulatory Visit
Admission: RE | Admit: 2017-11-04 | Discharge: 2017-11-04 | Disposition: A | Discharge status: Home / Self Care | Payer: Medicare Other | Source: Ambulatory Visit | Attending: Interventional Radiology | Admitting: Interventional Radiology

## 2017-11-04 DIAGNOSIS — K7689 Other specified diseases of liver: Secondary | ICD-10-CM | POA: Diagnosis not present

## 2017-11-04 DIAGNOSIS — K769 Liver disease, unspecified: Secondary | ICD-10-CM

## 2017-11-04 HISTORY — PX: IR RADIOLOGIST EVAL & MGMT: IMG5224

## 2017-11-04 NOTE — Progress Notes (Signed)
Chief Complaint: Patient was seen in follow-up today for  Chief Complaint  Patient presents with  . Follow-up   at the request of Raoul  Referring Physician(s): Dr. Irene Limbo  History of Present Illness: Crystal Barnett is a 77 y.o. female with a history of NASH cirrhosis comlicatedby thrombocytopenia and minimal ascites. MRI imaging was performed first in December 2016 to evaluate a region of possible concern in the left hepatic lobe. On that study, there was a small 9 mm enhancing focus in hepatic segment 7 in the posterosuperior aspect of the hepatic dome. This subsequently been followed at 6 month intervals with repeat MRI scans of the abdomen. On the scan which was performed on 02/27/2016, the lesion measures 10 mm compared to 9 mm nearly one year previously. However, the lesion does appear slightly more prominent and demonstrates enhancement characteristics which are concerning for hepatocellular carcinoma. Imaging on 10/22/2016, this area of concern showed mild interval enlargement to 1.7 cm.  We elected to continue observation despite the slow growth and her most recent MR imaging from 04/23/17 demonstrates stability at 1.6 cm.    Her most recent MRI dated 10/28/2017 demonstrates continued stability of the lesion.  In fact, on today's examination it measures approximately 1.2 cm.  The slight decrease in size may represent differences in measurement technique rather than true involution.  The imaging characteristics remain highly concerning for low grade HCC.   Clinically, the patient is doing well. She has no new complaints. No change in her medical history. She denies any abdominal pain or distention related to her ascites.    Past Medical History:  Diagnosis Date  . ALLERGIC RHINITIS 01/12/2008  . Colon polyps    hyperplastic  . Depression   . Diabetes ([place])   . Fatty liver   . GOITER, MULTINODULAR 01/12/2008  . Hepatic cirrhosis ([place])   . Hyperlipidemia   .  Hypertension   . HYPOTHYROIDISM 09/07/2008  . 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 Splenomegaly   . Thrombocytopenia ([place])    Appears chronic from at least 2014  . Vitamin D deficiency     Past Surgical History:  Procedure Laterality Date  . arthroscopic left knee  2005  . BREAST SURGERY  2008   Breast Biopsy   . CHOLECYSTECTOMY  1985  . IR GENERIC HISTORICAL  04/02/2016   IR RADIOLOGIST EVAL & MGMT 04/02/2016 Jacqulynn Cadet, MD [facility] INTERV RAD  . IR RADIOLOGIST EVAL & MGMT  10/29/2016  . IR RADIOLOGIST EVAL & MGMT  04/29/2017  . IR RADIOLOGIST EVAL & MGMT  11/04/2017    Allergies: Patient has no known allergies.  Medications: Prior to Admission medications   Medication Sig Start Date End Date Taking? Authorizing Provider  alendronate (FOSAMAX) 70 MG tablet  03/24/17  Yes [provider]  cholecalciferol (VITAMIN D) 1000 UNITS tablet Take 2,000 Units by mouth daily.   Yes [provider]  fluticasone (FLONASE) 50 MCG/ACT nasal spray Place into both nostrils daily.   Yes [provider]  Lactobacillus (PROBIOTIC ACIDOPHILUS) CAPS Take by mouth daily.   Yes [provider]  levothyroxine (SYNTHROID, LEVOTHROID) 50 MCG tablet TAKE 1 TABLET (50 MCG TOTAL) BY MOUTH DAILY. 12/23/16  Yes Renato Shin, MD  lisinopril-hydrochlorothiazide [facility]) 10-12.5 MG tablet  03/14/15  Yes [provider]  metFORMIN (GLUCOPHAGE) 500 MG tablet Take 1 tablet by mouth once daily  Yes [provider]  Multiple Vitamin (MULTIVITAMIN) tablet Take 1 tablet by mouth daily.   Yes [provider]  montelukast (SINGULAIR) 10 MG tablet  03/25/14   [provider]     Family History  Problem Relation Age of Onset  . Goiter Maternal Grandmother   . Stroke Father 2    Social History   Socioeconomic History  . Marital status: Single    Spouse name: Not on file  . Number  of children: Not on file  . Years of education: Not on file  . Highest education level: Not on file  Occupational History  . Occupation: Chiropodist    Comment: works in Chiropodist for [employer]  . Financial resource strain: Not on file  . Food insecurity:    Worry: Not on file    Inability: Not on file  . Transportation needs:    Medical: Not on file    Non-medical: Not on file  Tobacco Use  . Smoking status: Former Smoker    Last attempt to quit: 05/13/1990    Years since quitting: 27.4  . Smokeless tobacco: Never Used  Substance and Sexual Activity  . Alcohol use: No    Alcohol/week: 0.0 oz  . Drug use: No  . Sexual activity: Not on file  Lifestyle  . Physical activity:    Days per week: Not on file    Minutes per session: Not on file  . Stress: Not on file  Relationships  . Social connections:    Talks on phone: Not on file    Gets together: Not on file    Attends religious service: Not on file    Active member of club or organization: Not on file    Attends meetings of clubs or organizations: Not on file    Relationship status: Not on file  Other Topics Concern  . Not on file  Social History Narrative  . Not on file    ECOG Status: 0 - Asymptomatic  Review of Systems: A 12 point ROS discussed and pertinent positives are indicated in the HPI above.  All other systems are negative.  Review of Systems  Vital Signs: BP (!) 169/77   Pulse 75   Temp 98.2 F (36.8 C) (Oral)   Resp 16   Ht 5' 5"  (1.651 m)   Wt 220 lb (99.8 kg)   SpO2 96%   BMI 36.61 kg/m   Physical Exam  Constitutional: She is oriented to person, place, and time. She appears well-developed and well-nourished. No distress.  HENT:  Head: Normocephalic and atraumatic.  Eyes: No scleral icterus.  Cardiovascular: Normal rate and regular rhythm.  Pulmonary/Chest: Effort normal and breath sounds normal.  Abdominal: Soft. She exhibits no distension. There is no  tenderness.  Musculoskeletal: She exhibits no edema.  Neurological: She is alert and oriented to person, place, and time.  Skin: Skin is warm and dry.  Psychiatric: She has a normal mood and affect. Her behavior is normal.  Nursing note and vitals reviewed.    Imaging: Mr Abdomen W Wo Contrast  Result Date: 10/28/2017 CLINICAL DATA:  Cirrhosis.  Follow-up hepatic mass. EXAM: MRI ABDOMEN WITHOUT AND WITH CONTRAST TECHNIQUE: Multiplanar multisequence MR imaging of the abdomen was performed both before and after the administration of intravenous contrast. CONTRAST:  22m EOVIST GADOXETATE DISODIUM 0.25 MOL/L IV SOLN COMPARISON:  04/23/2017 and prior studies dating back to 05/04/2015 FINDINGS: Lower chest: No acute findings. Hepatobiliary:  Hepatic cirrhosis is again demonstrated. A 1.2 cm hypervascular mass is again seen in the dome of the right lobe, which shows delayed washout and peripheral rim enhancement. Although not significant change compared to prior studies, these findings are diagnostic of hepatocellular carcinoma in the setting of cirrhosis. No other liver masses are identified. Portal veins are patent. Prior cholecystectomy. No evidence of biliary obstruction. Pancreas:  No mass or inflammatory changes. Spleen: Mild splenomegaly measuring 15-16 cm in length, consistent with portal venous hypertension. Adrenals/Urinary Tract: No masses identified. No evidence of hydronephrosis. Stomach/Bowel: Visualized portion unremarkable. Vascular/Lymphatic: No pathologically enlarged lymph nodes identified. No abdominal aortic aneurysm. Other:  Moderate ascites, increased since previous study. Musculoskeletal:  No suspicious bone lesions identified. IMPRESSION: 1.2 cm hypervascular mass in the dome of the right hepatic lobe shows no significant change, however Imaging findings are diagnostic of hepatocellular carcinoma in the setting of cirrhosis. No evidence of abdominal metastatic disease. Hepatic cirrhosis,  and mild splenomegaly consistent with portal venous hypertension. Moderate ascites, increased since prior study. Electronically Signed   By: Earle Gell M.D.   On: 10/28/2017 13:54   Ir Radiologist Eval & Mgmt  Result Date: 11/04/2017 Please refer to notes tab for details about interventional procedure. (Op Note)   Labs:  CBC: Recent Labs    12/02/16 0948 04/18/17 1210 06/04/17 0852 10/23/17 1027  WBC 3.8* 3.1* 3.1* 4.3  HGB 13.2 13.0 12.9 13.2  HCT 38.8 37.8 38.6 39.5  PLT 79* 91* 87* 111*    COAGS: Recent Labs    04/18/17 1210  INR 1.1    BMP: Recent Labs    12/02/16 0949 04/18/17 1210 06/04/17 0852 10/23/17 1027  NA 140 140 140 140  K 4.4 4.0 3.9 4.0  CL  --  105 104 104  CO2 24 31 29 26   GLUCOSE 151* 102 200* 104  BUN 9.8 9 10 10   CALCIUM 8.7 9.1 8.9 9.2  CREATININE 0.7 0.61 0.78 0.69  GFRNONAA  --  87 >60 >60  GFRAA  --  101 >60 >60    LIVER FUNCTION TESTS: Recent Labs    12/02/16 0949 04/18/17 1210 06/04/17 0852 10/23/17 1027  BILITOT 0.60 0.5 0.4 0.7  AST 34 34 31 28  ALT 29 23 26 16   ALKPHOS 62  --  65 68  PROT 6.3* 6.3 6.6 6.7  ALBUMIN 3.5  --  3.6 3.7    TUMOR MARKERS: Recent Labs    04/18/17 1210  AFPTM 5.4    Assessment and Plan:  The small 1.2 cm lesion in hepatic segment 7 demonstrates no interval change over the past year.  The imaging features remain diagnostic of a small hepatocellular carcinoma.  Given the stability over the past 12 months, this is likely a well-differentiated/indolent hepatocellular carcinoma.  Imaging over the same time.  Does demonstrate increasing ascites.  Crystal Barnett remains clinically asymptomatic, but this is evidence of further degradation of her overall liver function despite the fact that her liver labs remain within normal limits.  She is not currently on diuretics and she is not following a low-sodium diet.  Her renal function is normal.  We discussed the options of continued surveillance and  are in agreement that until the lesion has enlarged closer to 2 cm, we should defer treatment.  We will begin addressing her ascites with a low sodium (less than 2 g daily) diet.  1.)  Begin low-sodium diet 2.)  Continued lesion surveillance.  Return clinic visit in 78  months (December 2019) with repeat liver labs and repeat gadolinium enhanced abdominal MRI. 3.)  If ascites is progressive at the next clinic visit, we will consider beginning diuretic therapy.    Electronically Signed: Jacqulynn Cadet 11/04/2017, 12:42 PM   I spent a total of 15 Minutes in face to face in clinical consultation, greater than 50% of which was counseling/coordinating care for liver lesion.

## 2017-11-05 ENCOUNTER — Other Ambulatory Visit: Payer: Self-pay | Admitting: Endocrinology

## 2017-11-10 ENCOUNTER — Other Ambulatory Visit: Payer: Self-pay | Admitting: Family Medicine

## 2017-11-10 DIAGNOSIS — Z1231 Encounter for screening mammogram for malignant neoplasm of breast: Secondary | ICD-10-CM

## 2017-11-11 ENCOUNTER — Other Ambulatory Visit (HOSPITAL_COMMUNITY): Payer: Self-pay | Admitting: Interventional Radiology

## 2017-11-19 DIAGNOSIS — Z961 Presence of intraocular lens: Secondary | ICD-10-CM | POA: Diagnosis not present

## 2017-11-19 DIAGNOSIS — E119 Type 2 diabetes mellitus without complications: Secondary | ICD-10-CM | POA: Diagnosis not present

## 2017-11-28 NOTE — Progress Notes (Signed)
Crystal Barnett Kitchen  HEMATOLOGY ONCOLOGY PROGRESS NOTE  Date of service:  12/02/17   Patient Care Team: Crystal Ruff, MD as PCP - General (Family Medicine)  Diagnosis:  #1 Thrombocytopenia - ?related to NASH with liver cirrhosis/hypersplenism #2 History of fatty liver now with concern for liver cirrhosis from NASH and concerning liver lesion -likely dysplastic nodule vs early [facility] #3 Liver nodule ?dysplatic nodule vs HCC (will be following up with Dr. Laurence Barnett)  Current Treatment: observation and further evaluation  INTERVAL HISTORY:   Crystal Barnett is here for follow-up regarding her thrombocytopenia and regarding her concerning liver lesion - likely low grade HCC. Her last visit was 06/04/17. She presents to the clinic today by herself noting she is doing well. She was last seen by Dr, Crystal Barnett in 10/2017 and had a MRI abdomen with stable nodule. She notes Dr. Laurence Barnett will not likely do ablation due to location but will try Y90 treatment. She notes she sees Dr. Henrene Barnett yearly and is due for her next endoscopy.   On review of symptoms, pt notes she is hard of hearing. She denies issues with bleeding in stool, mouth or nose.        REVIEW OF SYSTEMS:    10 Point review of systems of done and is negative except as noted above in HPI.   Crystal Barnett Kitchen Past Medical History:  Diagnosis Date  . ALLERGIC RHINITIS 01/12/2008  . Colon polyps    hyperplastic  . Depression   . Diabetes ([place])   . Fatty liver   . GOITER, MULTINODULAR 01/12/2008  . Hepatic cirrhosis ([place])   . Hyperlipidemia   . Hypertension   . HYPOTHYROIDISM 09/07/2008  . 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 Splenomegaly   . Thrombocytopenia ([place])    Appears chronic from at least 2014  . Vitamin D deficiency     . Past Surgical History:  Procedure Laterality Date  . arthroscopic left knee  2005  . BREAST SURGERY  2008   Breast Biopsy   . CHOLECYSTECTOMY  1985  . IR GENERIC HISTORICAL   04/02/2016   IR RADIOLOGIST EVAL & MGMT 04/02/2016 Crystal Cadet, MD [facility] INTERV RAD  . IR RADIOLOGIST EVAL & MGMT  10/29/2016  . IR RADIOLOGIST EVAL & MGMT  04/29/2017  . IR RADIOLOGIST EVAL & MGMT  11/04/2017    . Social History   Tobacco Use  . Smoking status: Former Smoker    Last attempt to quit: 05/13/1990    Years since quitting: 27.5  . Smokeless tobacco: Never Used  Substance Use Topics  . Alcohol use: No    Alcohol/week: 0.0 oz  . Drug use: No    ALLERGIES:  has No Known Allergies.  MEDICATIONS:  Current Outpatient Medications  Medication Sig Dispense Refill  . alendronate (FOSAMAX) 70 MG tablet     . cholecalciferol (VITAMIN D) 1000 UNITS tablet Take 2,000 Units by mouth daily.    . fluticasone (FLONASE) 50 MCG/ACT nasal spray Place into both nostrils daily.    . Lactobacillus (PROBIOTIC ACIDOPHILUS) CAPS Take by mouth daily.    Crystal Barnett Kitchen levothyroxine (SYNTHROID, LEVOTHROID) 50 MCG tablet TAKE 1 TABLET EVERY DAY 90 tablet 3  . lisinopril-hydrochlorothiazide (PRINZIDE,ZESTORETIC) 10-12.5 MG tablet     . metFORMIN (GLUCOPHAGE) 500 MG tablet Take 1 tablet by mouth once daily     . montelukast (SINGULAIR) 10 MG tablet     . Multiple Vitamin (MULTIVITAMIN) tablet  Take 1 tablet by mouth daily.     No current facility-administered medications for this visit.     PHYSICAL EXAMINATION: ECOG PERFORMANCE STATUS: 1 - Symptomatic but completely ambulatory  . Vitals:   12/02/17 0920  BP: 130/62  Pulse: 77  Resp: 18  Temp: 98.2 F (36.8 C)  SpO2: 97%   Filed Weights   12/02/17 0920  Weight: 225 lb (102.1 kg)   .Body mass index is 37.44 kg/m.  GENERAL:alert, in no acute distress and comfortable SKIN: skin color, texture, turgor are normal, no rashes or significant lesions EYES: normal, conjunctiva are pink and non-injected, sclera clear OROPHARYNX:no exudate, no erythema and lips, buccal mucosa, and tongue normal  NECK: supple, no JVD, thyroid normal size,  non-tender, without nodularity LYMPH:  no palpable lymphadenopathy in the cervical, axillary or inguinal LUNGS: clear to auscultation with normal respiratory effort HEART: regular rate & rhythm,  no murmurs (+) B/l  lower extremity edema in ankles ABDOMEN: abdomen soft, non-tender, normoactive bowel sounds ,No palpable hepatosplenomegaly Musculoskeletal: no cyanosis of digits and no clubbing  PSYCH: alert & oriented x 3 with fluent speech NEURO: no focal motor/sensory deficits  LABORATORY DATA:   I have reviewed the data as listed  . CBC Latest Ref Rng & Units 12/02/2017 10/23/2017 06/04/2017  WBC 3.9 - 10.3 K/uL 4.5 4.3 3.1(L)  Hemoglobin 11.6 - 15.9 g/dL 13.2 13.2 12.9  Hematocrit 34.8 - 46.6 % 39.3 39.5 38.6  Platelets 145 - 400 K/uL 124(L) 111(L) 87(L)    . CMP Latest Ref Rng & Units 12/02/2017 10/23/2017 06/04/2017  Glucose 70 - 99 mg/dL 112(H) 104 200(H)  BUN 8 - 23 mg/dL 10 10 10   Creatinine 0.44 - 1.00 mg/dL 0.68 0.69 0.78  Sodium 135 - 145 mmol/L 141 140 140  Potassium 3.5 - 5.1 mmol/L 4.1 4.0 3.9  Chloride 98 - 111 mmol/L 105 104 104  CO2 22 - 32 mmol/L 27 26 29   Calcium 8.9 - 10.3 mg/dL 9.2 9.2 8.9  Total Protein 6.5 - 8.1 g/dL 6.8 6.7 6.6  Total Bilirubin 0.3 - 1.2 mg/dL 0.6 0.7 0.4  Alkaline Phos 38 - 126 U/L 70 68 65  AST 15 - 41 U/L 29 28 31   ALT 0 - 44 U/L 19 16 26      RADIOLOGY  MRI Abdomen W WO Contrast 10/28/17  IMPRESSION: 1.2 cm hypervascular mass in the dome of the right hepatic lobe shows no significant change, however Imaging findings are diagnostic of hepatocellular carcinoma in the setting of cirrhosis. No evidence of abdominal metastatic disease. Hepatic cirrhosis, and mild splenomegaly consistent with portal venous hypertension. Moderate ascites, increased since prior study.  MRI abd w and wo contrast 04/23/17 stable appearance of segment 7 liver lesion. Highly suspicious for hepatocellular carcinoma. Cirrhosis, mild hepatic steatosis and  splenomegaly.   MRI abd w and wo contrast 10/22/2016 CLINICAL DATA:  Followup of liver lesions. Nonalcoholic steatohepatitis induced cirrhosis. Thrombocytopenia. EXAM: MRI ABDOMEN WITHOUT AND WITH CONTRAST TECHNIQUE: Multiplanar multisequence MR imaging of the abdomen was performed both before and after the administration of intravenous contrast. CONTRAST:  12m EOVIST GADOXETATE DISODIUM 0.25 MOL/L IV SOLN COMPARISON:  MRI of 02/27/2016.  Clinic note of 04/02/2016. FINDINGS: Motion degradation involves the arterial phase post-contrast images primarily. Lower chest: Normal heart size without pericardial or pleural effusion. Hepatobiliary: Moderate to marked cirrhosis. Segment 7 liver lesion has undergone mild interval enlargement. For example, on T2 weighted imaging, measures 12 x 10 mm (image 13/series 7) versus 10  x 9 mm on the prior. On post-contrast image 27/ series 502, measures 17 x 13 mm today versus 14 x 10 mm on the prior (when remeasured). Demonstrates arterial and portal venous phase hyper enhancement with relative isointensity on more delayed post-contrast imaging. Left hepatic lobe tiny lesion is likely a cyst and is similar. Mild hepatic steatosis. Cholecystectomy, without biliary ductal dilatation. Pancreas:  Normal, without mass or ductal dilatation. Spleen:  Mild splenomegaly, 14.3 cm craniocaudal. Adrenals/Urinary Tract: Normal adrenal glands. Normal kidneys, without hydronephrosis. Stomach/Bowel: Normal stomach and abdominal bowel loops. Vascular/Lymphatic: Patent hepatic and portal veins. No specific evidence of portal venous hypertension. Circumaortic left renal vein. No retroperitoneal or retrocrural adenopathy. Other:  Small volume perihepatic ascites is unchanged Musculoskeletal: No acute osseous abnormality. IMPRESSION: 1. Mild interval enlargement of a segment 7 liver lesion, highly suspicious for hepatocellular carcinoma. 2. Cirrhosis, mild hepatic  steatosis, and mild splenomegaly. Electronically Signed   By: Abigail Miyamoto M.D.   On: 10/22/2016 10:22    ASSESSMENT & PLAN:   #1 Chronic thrombocytopenia. Platelet counts today are relatively stable at 87k (were 79k -6 months ago) Likely related to cirrhosis related to NASH with mild splenomegaly.  B12, TSH, LDH within normal limits. 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 Recent EGD showed no evidence of varices. Subtle changes of portal hypertensive gastropathy.  Plan  -Pts PLT increased to 124k, liver functions are WNL.  -Counseled pt to continue f/u with her PCP Crystal Ruff, MD  -Pt rpt MRI abdomen from 10/2017 showed stable liver lesion. Also shows ascites and portal HTN, I suggest she follow up with Dr. Henrene Barnett for management and screen for varices -Continue liver related cares with Dr. Henrene Barnett -No other intervention indicated at this time. -if the platelets are less than 30k or if they're less than 50k  and with bleeding issues -would consider the use of Nplate. --liver cirrhosis mx per GI and PCP   #3 Lesion in the RIGHT hepatic lobe has imaging characteristics highly suspicious for hepatic cellular carcinoma including arterial enhancement and a new peripheral rim.  -MRI results from 04/23/17 do not indicate a significant change in the liver nodule   -Rpt MRI from 10/28/17 shows overall stable liver lesion at 1.2cm, mild splenomegaly, moderate ascites, liver cirrhosis.   PLAN:  -AFP tumor marker remains stable. At 5.4 <---6.9 -She continues to follow-up with Crystal Barnett (IR)- to determine need for ablation or possible radiation of the liver lesion. He did not deem necessary at this time. -I reviewed 10/2017 MRI with pt today, overall stable with evidence of portal vein HTN. I recommend she follow up with Dr. Henrene Barnett for management  -She will proceed with endoscopy this year.    RTC with  Dr Irene Limbo in 6 months with labs Continue follow-up with primary care physician.  . The total time spent in the appointment was 20 minutes and more than 50% was on counseling and direct patient cares.      Sullivan Lone MD MS AAHIVMS [facility] Hematology/Oncology Physician [facility]  (Office):       [phone] (Work cell):  [phone] (Fax):           [phone]  I, Joslyn Devon, am acting as scribe for Sullivan Lone, MD.   .I have reviewed the above documentation for accuracy and completeness, and I agree with the above.    Brunetta Genera MD MS

## 2017-12-02 ENCOUNTER — Inpatient Hospital Stay: Payer: Medicare Other

## 2017-12-02 ENCOUNTER — Inpatient Hospital Stay: Payer: Medicare Other | Attending: Hematology | Admitting: Hematology

## 2017-12-02 ENCOUNTER — Encounter: Payer: Self-pay | Admitting: Hematology

## 2017-12-02 ENCOUNTER — Encounter: Payer: Self-pay | Admitting: Internal Medicine

## 2017-12-02 ENCOUNTER — Telehealth: Payer: Self-pay | Admitting: Hematology

## 2017-12-02 VITALS — BP 130/62 | HR 77 | Temp 98.2°F | Resp 18 | Ht 65.0 in | Wt 225.0 lb

## 2017-12-02 DIAGNOSIS — D731 Hypersplenism: Secondary | ICD-10-CM | POA: Diagnosis not present

## 2017-12-02 DIAGNOSIS — E039 Hypothyroidism, unspecified: Secondary | ICD-10-CM

## 2017-12-02 DIAGNOSIS — Z79899 Other long term (current) drug therapy: Secondary | ICD-10-CM | POA: Diagnosis not present

## 2017-12-02 DIAGNOSIS — D696 Thrombocytopenia, unspecified: Secondary | ICD-10-CM

## 2017-12-02 DIAGNOSIS — F329 Major depressive disorder, single episode, unspecified: Secondary | ICD-10-CM | POA: Insufficient documentation

## 2017-12-02 DIAGNOSIS — K766 Portal hypertension: Secondary | ICD-10-CM | POA: Diagnosis not present

## 2017-12-02 DIAGNOSIS — K3189 Other diseases of stomach and duodenum: Secondary | ICD-10-CM

## 2017-12-02 DIAGNOSIS — E785 Hyperlipidemia, unspecified: Secondary | ICD-10-CM | POA: Diagnosis not present

## 2017-12-02 DIAGNOSIS — K769 Liver disease, unspecified: Secondary | ICD-10-CM

## 2017-12-02 DIAGNOSIS — C220 Liver cell carcinoma: Secondary | ICD-10-CM

## 2017-12-02 DIAGNOSIS — I10 Essential (primary) hypertension: Secondary | ICD-10-CM | POA: Diagnosis not present

## 2017-12-02 DIAGNOSIS — K7581 Nonalcoholic steatohepatitis (NASH): Secondary | ICD-10-CM | POA: Diagnosis not present

## 2017-12-02 DIAGNOSIS — Z86010 Personal history of colonic polyps: Secondary | ICD-10-CM | POA: Diagnosis not present

## 2017-12-02 DIAGNOSIS — R188 Other ascites: Secondary | ICD-10-CM | POA: Diagnosis not present

## 2017-12-02 DIAGNOSIS — E119 Type 2 diabetes mellitus without complications: Secondary | ICD-10-CM | POA: Insufficient documentation

## 2017-12-02 DIAGNOSIS — K7460 Unspecified cirrhosis of liver: Secondary | ICD-10-CM

## 2017-12-02 DIAGNOSIS — E559 Vitamin D deficiency, unspecified: Secondary | ICD-10-CM

## 2017-12-02 DIAGNOSIS — Z7984 Long term (current) use of oral hypoglycemic drugs: Secondary | ICD-10-CM | POA: Diagnosis not present

## 2017-12-02 DIAGNOSIS — Z87891 Personal history of nicotine dependence: Secondary | ICD-10-CM | POA: Diagnosis not present

## 2017-12-02 LAB — RETICULOCYTES
RBC.: 4.45 MIL/uL (ref 3.70–5.45)
Retic Count, Absolute: 71.2 10*3/uL (ref 33.7–90.7)
Retic Ct Pct: 1.6 % (ref 0.7–2.1)

## 2017-12-02 LAB — CBC WITH DIFFERENTIAL (CANCER CENTER ONLY)
Basophils Absolute: 0 10*3/uL (ref 0.0–0.1)
Basophils Relative: 0 %
Eosinophils Absolute: 0.1 10*3/uL (ref 0.0–0.5)
Eosinophils Relative: 3 %
HEMATOCRIT: 39.3 % (ref 34.8–46.6)
Hemoglobin: 13.2 g/dL (ref 11.6–15.9)
LYMPHS ABS: 1 10*3/uL (ref 0.9–3.3)
LYMPHS PCT: 23 %
MCH: 29.7 pg (ref 25.1–34.0)
MCHC: 33.6 g/dL (ref 31.5–36.0)
MCV: 88.3 fL (ref 79.5–101.0)
Monocytes Absolute: 0.4 10*3/uL (ref 0.1–0.9)
Monocytes Relative: 9 %
Neutro Abs: 2.9 10*3/uL (ref 1.5–6.5)
Neutrophils Relative %: 65 %
Platelet Count: 124 10*3/uL — ABNORMAL LOW (ref 145–400)
RBC: 4.45 MIL/uL (ref 3.70–5.45)
RDW: 13.6 % (ref 11.2–14.5)
WBC: 4.5 10*3/uL (ref 3.9–10.3)

## 2017-12-02 LAB — CMP (CANCER CENTER ONLY)
ALBUMIN: 3.7 g/dL (ref 3.5–5.0)
ALT: 19 U/L (ref 0–44)
AST: 29 U/L (ref 15–41)
Alkaline Phosphatase: 70 U/L (ref 38–126)
Anion gap: 9 (ref 5–15)
BUN: 10 mg/dL (ref 8–23)
CHLORIDE: 105 mmol/L (ref 98–111)
CO2: 27 mmol/L (ref 22–32)
Calcium: 9.2 mg/dL (ref 8.9–10.3)
Creatinine: 0.68 mg/dL (ref 0.44–1.00)
GFR, Est AFR Am: >60 mL/min (ref >60)
GFR, Estimated: >60 mL/min (ref >60)
GLUCOSE: 112 mg/dL — AB (ref 70–99)
POTASSIUM: 4.1 mmol/L (ref 3.5–5.1)
SODIUM: 141 mmol/L (ref 135–145)
Total Bilirubin: 0.6 mg/dL (ref 0.3–1.2)
Total Protein: 6.8 g/dL (ref 6.5–8.1)

## 2017-12-02 NOTE — Telephone Encounter (Signed)
Gave patient avs and calendar of jan appts

## 2017-12-03 LAB — AFP TUMOR MARKER: AFP, SERUM, TUMOR MARKER: 5.4 ng/mL (ref 0.0–8.3)

## 2017-12-12 ENCOUNTER — Ambulatory Visit
Admission: RE | Admit: 2017-12-12 | Discharge: 2017-12-12 | Disposition: A | Discharge status: Home / Self Care | Payer: Medicare Other | Source: Ambulatory Visit | Attending: Family Medicine | Admitting: Family Medicine

## 2017-12-12 DIAGNOSIS — Z1231 Encounter for screening mammogram for malignant neoplasm of breast: Secondary | ICD-10-CM | POA: Diagnosis not present

## 2018-01-14 ENCOUNTER — Ambulatory Visit (AMBULATORY_SURGERY_CENTER): Payer: Self-pay

## 2018-01-14 VITALS — Ht 64.0 in | Wt 223.6 lb

## 2018-01-14 DIAGNOSIS — K7581 Nonalcoholic steatohepatitis (NASH): Secondary | ICD-10-CM

## 2018-01-14 NOTE — Progress Notes (Signed)
No egg or soy allergy known to patient  No issues with past sedation with any surgeries  or procedures, no intubation problems  No diet pills per patient No home 02 use per patient  No blood thinners per patient  Pt denies issues with constipation  No A fib or A flutter  EMMI video sent to pt's e mail pt declined

## 2018-01-28 ENCOUNTER — Encounter: Payer: Self-pay | Admitting: Internal Medicine

## 2018-01-28 ENCOUNTER — Ambulatory Visit (AMBULATORY_SURGERY_CENTER): Payer: Medicare Other | Admitting: Internal Medicine

## 2018-01-28 VITALS — BP 118/64 | HR 77 | Temp 98.6°F | Resp 13 | Ht 64.0 in | Wt 223.0 lb

## 2018-01-28 DIAGNOSIS — K7460 Unspecified cirrhosis of liver: Secondary | ICD-10-CM

## 2018-01-28 DIAGNOSIS — I10 Essential (primary) hypertension: Secondary | ICD-10-CM | POA: Diagnosis not present

## 2018-01-28 DIAGNOSIS — R932 Abnormal findings on diagnostic imaging of liver and biliary tract: Secondary | ICD-10-CM

## 2018-01-28 DIAGNOSIS — K7581 Nonalcoholic steatohepatitis (NASH): Secondary | ICD-10-CM

## 2018-01-28 DIAGNOSIS — K219 Gastro-esophageal reflux disease without esophagitis: Secondary | ICD-10-CM | POA: Diagnosis not present

## 2018-01-28 DIAGNOSIS — E119 Type 2 diabetes mellitus without complications: Secondary | ICD-10-CM | POA: Diagnosis not present

## 2018-01-28 MED ORDER — SODIUM CHLORIDE 0.9 % IV SOLN: 500.0000 mL | Freq: Once | INTRAVENOUS | Status: DC

## 2018-01-28 NOTE — Patient Instructions (Signed)
YOU HAD AN ENDOSCOPIC PROCEDURE TODAY AT THE [facility]:   Refer to the procedure report that was given to you for any specific questions about what was found during the examination.  If the procedure report does not answer your questions, please call your gastroenterologist to clarify.  If you requested that your care partner not be given the details of your procedure findings, then the procedure report has been included in a sealed envelope for you to review at your convenience later.  YOU SHOULD EXPECT: Some feelings of bloating in the abdomen. Passage of more gas than usual.  Walking can help get rid of the air that was put into your GI tract during the procedure and reduce the bloating. If you had a lower endoscopy (such as a colonoscopy or flexible sigmoidoscopy) you may notice spotting of blood in your stool or on the toilet paper. If you underwent a bowel prep for your procedure, you may not have a normal bowel movement for a few days.  Please Note:  You might notice some irritation and congestion in your nose or some drainage.  This is from the oxygen used during your procedure.  There is no need for concern and it should clear up in a day or so.  SYMPTOMS TO REPORT IMMEDIATELY:   Following upper endoscopy (EGD)  Vomiting of blood or coffee ground material  New chest pain or pain under the shoulder blades  Painful or persistently difficult swallowing  New shortness of breath  Fever of 100F or higher  Black, tarry-looking stools  For urgent or emergent issues, a gastroenterologist can be reached at any hour by calling [phone].   DIET:  We do recommend a small meal at first, but then you may proceed to your regular diet.  Drink plenty of fluids but you should avoid alcoholic beverages for 24 hours.  ACTIVITY:  You should plan to take it easy for the rest of today and you should NOT DRIVE or use heavy machinery until tomorrow (because of the sedation medicines used  during the test).    FOLLOW UP: Our staff will call the number listed on your records the next business day following your procedure to check on you and address any questions or concerns that you may have regarding the information given to you following your procedure. If we do not reach you, we will leave a message.  However, if you are feeling well and you are not experiencing any problems, there is no need to return our call.  We will assume that you have returned to your regular daily activities without incident.  If any biopsies were taken you will be contacted by phone or by letter within the next 1-3 weeks.  Please call us at [phone] if you have not heard about the biopsies in 3 weeks.    SIGNATURES/CONFIDENTIALITY: You and/or your care partner have signed paperwork which will be entered into your electronic medical record.  These signatures attest to the fact that that the information above on your After Visit Summary has been reviewed and is understood.  Full responsibility of the confidentiality of this discharge information lies with you and/or your care-partner. 

## 2018-01-28 NOTE — Progress Notes (Signed)
Report to PACU, RN, vss, BBS= Clear.  

## 2018-01-28 NOTE — Op Note (Signed)
[place] Patient Name: Crystal Barnett Procedure Date: 01/28/2018 9:26 AM MRN: [id number] Endoscopist: Docia Chuck. Henrene Pastor , MD Age: 77 Referring MD:  Date of Birth: 1940-10-18 Gender: Female Account #: [id number] Procedure:                Upper GI endoscopy Indications:              Cirrhosis rule out esophageal varices. Last                            examination March 2017. No varices Medicines:                Monitored Anesthesia Care Procedure:                Pre-Anesthesia Assessment:                           - Prior to the procedure, a History and Physical                            was performed, and patient medications and                            allergies were reviewed. The patient's tolerance of                            previous anesthesia was also reviewed. The risks                            and benefits of the procedure and the sedation                            options and risks were discussed with the patient.                            All questions were answered, and informed consent                            was obtained. Prior Anticoagulants: The patient has                            taken no previous anticoagulant or antiplatelet                            agents. ASA Grade Assessment: III - A patient with                            severe systemic disease. After reviewing the risks                            and benefits, the patient was deemed in                            satisfactory condition to undergo the procedure.  After obtaining informed consent, the endoscope was                            passed under direct vision. Throughout the                            procedure, the patient's blood pressure, pulse, and                            oxygen saturations were monitored continuously. The                            Model GIF-HQ190 [id number]) scope was introduced                            through the mouth, and  advanced to the second part                            of duodenum. The upper GI endoscopy was                            accomplished without difficulty. The patient                            tolerated the procedure well. Scope In: Scope Out: Findings:                 The esophagus was normal. No varices.                           Mild portal hypertensive gastropathy was found in                            the gastric body. The stomach was otherwise normal.                            No varices.                           The examined duodenum was normal.                           The cardia and gastric fundus were normal on                            retroflexion. Complications:            No immediate complications. Estimated Blood Loss:     Estimated blood loss: none. Impression:               - Normal esophagus.                           - Portal hypertensive gastropathy.                           - Normal examined duodenum.                           -  No specimens collected. Recommendation:           - Patient has a contact number available for                            emergencies. The signs and symptoms of potential                            delayed complications were discussed with the                            patient. Return to normal activities tomorrow.                            Written discharge instructions were provided to the                            patient.                           - Resume previous diet.                           - Continue present medications.                           - Please arrange ultrasound-guided paracentesis up                            to 5 L. Albumin replacement. Send fluid for cell                            count with differential and cytology                           - Please follow-up in the office with Dr. Henrene Pastor in                            4-6 weeks                           - Repeat screening EGD in 2 years Crystal Rivere N.  Henrene Pastor, MD 01/28/2018 9:43:33 AM This report has been signed electronically.

## 2018-01-29 ENCOUNTER — Telehealth: Payer: Self-pay

## 2018-01-29 ENCOUNTER — Telehealth: Payer: Self-pay | Admitting: *Deleted

## 2018-01-29 ENCOUNTER — Other Ambulatory Visit: Payer: Self-pay

## 2018-01-29 DIAGNOSIS — R188 Other ascites: Secondary | ICD-10-CM

## 2018-01-29 NOTE — Telephone Encounter (Signed)
  Follow up Call-  Call back number 01/28/2018 07/20/2015  Post procedure Call Back phone  # [phone]  Permission to leave phone message Yes Yes  Some recent data might be hidden     Patient questions:  Do you have a fever, pain , or abdominal swelling? No. Pain Score  0 *  Have you tolerated food without any problems? Yes.    Have you been able to return to your normal activities? Yes.    Do you have any questions about your discharge instructions: Diet   No. Medications  No. Follow up visit  No.  Do you have questions or concerns about your Care? No.  Actions: * If pain score is 4 or above: No action needed, pain <4.  No problems noted per pt. maw

## 2018-01-29 NOTE — Telephone Encounter (Signed)
Pt scheduled for IR para at [facility] tomorrow at 10am, pt to arrive there at 9:45am. Para ordered per Dr. Blanch Media procedure note. Left message for pt regarding appt, requested she confirm appt.

## 2018-01-29 NOTE — Telephone Encounter (Signed)
Left message on f/u call 

## 2018-01-30 ENCOUNTER — Ambulatory Visit (HOSPITAL_COMMUNITY): Admission: RE | Admit: 2018-01-30 | Payer: Medicare Other | Source: Ambulatory Visit

## 2018-01-30 NOTE — Telephone Encounter (Signed)
Pt did not return call and did not show for appt. Attempted to call pt again today and had to leave another message.

## 2018-01-30 NOTE — Telephone Encounter (Signed)
Pts paracentesis rescheduled at [facility] hospital 02/02/18@10am , pt to arrive there at 9:45am. Pt aware of appt.

## 2018-02-02 ENCOUNTER — Encounter (HOSPITAL_COMMUNITY): Payer: Self-pay | Admitting: Physician Assistant

## 2018-02-02 ENCOUNTER — Ambulatory Visit (HOSPITAL_COMMUNITY)
Admission: RE | Admit: 2018-02-02 | Discharge: 2018-02-02 | Disposition: A | Discharge status: Home / Self Care | Payer: Medicare Other | Source: Ambulatory Visit | Attending: Internal Medicine | Admitting: Internal Medicine

## 2018-02-02 DIAGNOSIS — R188 Other ascites: Secondary | ICD-10-CM | POA: Insufficient documentation

## 2018-02-02 HISTORY — PX: IR PARACENTESIS: IMG2679

## 2018-02-02 LAB — BODY FLUID CELL COUNT WITH DIFFERENTIAL
EOS FL: 0 %
LYMPHS FL: 60 %
MONOCYTE-MACROPHAGE-SEROUS FLUID: 26 % — AB (ref 50–90)
NEUTROPHIL FLUID: 14 % (ref 0–25)
WBC FLUID: 530 uL (ref 0–1000)

## 2018-02-02 LAB — GRAM STAIN

## 2018-02-02 MED ORDER — ALBUMIN HUMAN 25 % IV SOLN
INTRAVENOUS | Status: AC
  Administered 2018-02-02: 12.5 g via INTRAVENOUS
  Filled 2018-02-02: qty 50

## 2018-02-02 MED ORDER — LIDOCAINE HCL 1 % IJ SOLN
INTRAMUSCULAR | Status: AC
  Filled 2018-02-02: qty 20

## 2018-02-02 MED ORDER — LIDOCAINE HCL 1 % IJ SOLN
INTRAMUSCULAR | Status: DC | PRN
  Administered 2018-02-02: 10 mL

## 2018-02-02 MED ORDER — ALBUMIN HUMAN 25 % IV SOLN
12.5000 g | Freq: Once | INTRAVENOUS | Status: AC
  Administered 2018-02-02: 12.5 g via INTRAVENOUS

## 2018-02-02 NOTE — Procedures (Signed)
PROCEDURE SUMMARY:  Successful image-guided paracentesis from the left lower abdomen.  Yielded 5.0 liters of hazy dark yellow fluid.  No immediate complications.  Patient tolerated well.   Specimen was sent for labs.  Please see imaging section in Epic for full dictation.  Joaquim Nam PA-C 02/02/2018 10:59 AM

## 2018-02-03 LAB — PATHOLOGIST SMEAR REVIEW

## 2018-02-06 ENCOUNTER — Telehealth: Payer: Self-pay | Admitting: Internal Medicine

## 2018-02-06 NOTE — Telephone Encounter (Signed)
Spoke with pt and let her know she just needs to call the office if she feels the fluid is building up in her belly again or if she is uncomfortable and we can get her scheduled for another para. Discussed there was not a set time frame it is based on how she is feeling. Pt verbalized understanding.

## 2018-02-06 NOTE — Telephone Encounter (Signed)
Pt called again, she also needs to know when she needs to schedule another paracentesis.

## 2018-02-07 LAB — CULTURE, BODY FLUID-BOTTLE

## 2018-02-07 LAB — CULTURE, BODY FLUID W GRAM STAIN -BOTTLE: Culture: NO GROWTH

## 2018-02-27 ENCOUNTER — Encounter: Payer: Self-pay | Admitting: Endocrinology

## 2018-02-27 ENCOUNTER — Ambulatory Visit (INDEPENDENT_AMBULATORY_CARE_PROVIDER_SITE_OTHER): Payer: Medicare Other | Admitting: Endocrinology

## 2018-02-27 VITALS — BP 126/72 | HR 72 | Ht 64.0 in | Wt 221.8 lb

## 2018-02-27 DIAGNOSIS — E042 Nontoxic multinodular goiter: Secondary | ICD-10-CM | POA: Diagnosis not present

## 2018-02-27 NOTE — Progress Notes (Signed)
Subjective:    Patient ID: Crystal Barnett, female    DOB: [birth year], 77 y.o.   MRN: [id number]  HPI pt returns for f/u of multinodular goiter (dx'ed 2009, when bilat bxs were benign in 2009; the cytology did not show chronic thyroiditis, but she has chronic hypothyroidism; she has been on synthroid since 2009; f/u US in 2016 was unchanged; [place] is 2019 showed interval enlargement of the isthmus nodule; bx in 2019 showed BENIGN FOLLICULAR NODULE ([place] CATEGORY II)).  She notices the goiter, but says there has been no change.   Past Medical History:  Diagnosis Date  . ALLERGIC RHINITIS 01/12/2008  . Allergy   . Cataract    bilateral  . Colon polyps    hyperplastic  . Diabetes ([place])   . Fatty liver   . GOITER, MULTINODULAR 01/12/2008  . Hepatic cirrhosis ([place])   . Hyperlipidemia   . Hypertension   . HYPOTHYROIDISM 09/07/2008  . 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 Osteoporosis   . Splenomegaly   . Thrombocytopenia ([place])    Appears chronic from at least 2014  . Vitamin D deficiency     Past Surgical History:  Procedure Laterality Date  . arthroscopic left knee  2005  . BREAST BIOPSY Left 2017  . BREAST EXCISIONAL BIOPSY Right 2008  . BREAST SURGERY  2008   Breast Biopsy   . CHOLECYSTECTOMY  1985  . COLONOSCOPY    . IR GENERIC HISTORICAL  04/02/2016   IR RADIOLOGIST EVAL & MGMT 04/02/2016 Jacqulynn Cadet, MD [facility] INTERV RAD  . IR PARACENTESIS  02/02/2018  . IR RADIOLOGIST EVAL & MGMT  10/29/2016  . IR RADIOLOGIST EVAL & MGMT  04/29/2017  . IR RADIOLOGIST EVAL & MGMT  11/04/2017  . POLYPECTOMY    . UPPER GASTROINTESTINAL ENDOSCOPY      Social History   Socioeconomic History  . Marital status: Single    Spouse name: Not on file  . Number of children: Not on file  . Years of education: Not on file  . Highest education level: Not on file  Occupational History  . 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 Financial resource strain: Not on file  . Food insecurity:    Worry: Not on file    Inability: Not on file  . Transportation needs:    Medical: Not on file    Non-medical: Not on file  Tobacco Use  . Smoking status: Former Smoker    Last attempt to quit: 05/13/1990    Years since quitting: 27.8  . Smokeless tobacco: Never Used  Substance and Sexual Activity  . Alcohol use: No    Alcohol/week: 0.0 standard drinks  . Drug use: No  . Sexual activity: Not on file  Lifestyle  . Physical activity:    Days per week: Not on file    Minutes per session: Not on file  . Stress: Not on file  Relationships  . Social connections:    Talks on phone: Not on file    Gets together: Not on file    Attends religious service: Not on file    Active member of club or organization: Not on file    Attends meetings of clubs or organizations: Not on file    Relationship status: Not on file  . Intimate partner violence:  Fear of current or ex partner: Not on file    Emotionally abused: Not on file    Physically abused: Not on file    Forced sexual activity: Not on file  Other Topics Concern  . Not on file  Social History Narrative  . Not on file    Current Outpatient Medications on File Prior to Visit  Medication Sig Dispense Refill  . alendronate (FOSAMAX) 70 MG tablet     . Blood Glucose Monitoring Suppl (ONETOUCH VERIO FLEX SYSTEM) w/Device KIT OneTouch Verio System  USE TO TEST ONCE D UTD    . cholecalciferol (VITAMIN D) 1000 UNITS tablet Take 2,000 Units by mouth daily.    . fluticasone (FLONASE) 50 MCG/ACT nasal spray Place into both nostrils daily.    . Lactobacillus (PROBIOTIC ACIDOPHILUS) CAPS Take by mouth daily.    Marland Kitchen levothyroxine (SYNTHROID, LEVOTHROID) 50 MCG tablet TAKE 1 TABLET EVERY DAY 90 tablet 3  . lisinopril-hydrochlorothiazide (PRINZIDE,ZESTORETIC) 10-12.5 MG tablet     . metFORMIN (GLUCOPHAGE) 500 MG tablet Take 1 tablet  by mouth once daily     . mometasone (NASONEX) 50 MCG/ACT nasal spray Nasonex    . montelukast (SINGULAIR) 10 MG tablet     . Multiple Vitamin (MULTIVITAMIN) tablet Take 1 tablet by mouth daily.     No current facility-administered medications on file prior to visit.     No Known Allergies  Family History  Problem Relation Age of Onset  . Goiter Maternal Grandmother   . Stroke Father 73  . Breast cancer Mother        in 70's  . Colon cancer Neg Hx   . Colon polyps Neg Hx   . Esophageal cancer Neg Hx   . Stomach cancer Neg Hx   . Rectal cancer Neg Hx     BP 126/72 (BP Location: Left Arm, Patient Position: Sitting, Cuff Size: Large)   Pulse 72   Ht 5' 4"  (1.626 m)   Wt 221 lb 12.8 oz (100.6 kg)   SpO2 96%   BMI 38.07 kg/m    Review of Systems Denies neck pain.      Objective:   Physical Exam VITAL SIGNS:  See vs page GENERAL: no distress.  Neck: thyroid is 5-10 times normal size (R>L).  3 nodules are palpable: left right, and just to the right of the isthmus.     Lab Results  Component Value Date   TSH 1.32 07/09/2017      Assessment & Plan:  MNG: due for recheck Hypothyroidism, poss unrelated to MNG: well-replaced  Patient Instructions  Let's recheck the ultrasound.  you will receive a phone call, about a day and time for an appointment. If there is no change, please come back for a follow-up appointment in 1 year.

## 2018-02-27 NOTE — Patient Instructions (Addendum)
Let's recheck the ultrasound.  you will receive a phone call, about a day and time for an appointment. If there is no change, please come back for a follow-up appointment in 1 year.

## 2018-03-02 ENCOUNTER — Ambulatory Visit
Admission: RE | Admit: 2018-03-02 | Discharge: 2018-03-02 | Disposition: A | Discharge status: Home / Self Care | Payer: Medicare Other | Source: Ambulatory Visit | Attending: Endocrinology | Admitting: Endocrinology

## 2018-03-02 DIAGNOSIS — E041 Nontoxic single thyroid nodule: Secondary | ICD-10-CM | POA: Diagnosis not present

## 2018-03-02 DIAGNOSIS — E042 Nontoxic multinodular goiter: Secondary | ICD-10-CM

## 2018-03-03 ENCOUNTER — Telehealth: Payer: Self-pay

## 2018-03-03 NOTE — Telephone Encounter (Signed)
Called to review results of [place]. Verbalized acceptance and understanding.

## 2018-03-03 NOTE — Telephone Encounter (Signed)
-----   Message from Renato Shin, MD sent at 03/02/2018  6:44 PM EDT ----- please call patient: No change--good

## 2018-03-10 ENCOUNTER — Ambulatory Visit (INDEPENDENT_AMBULATORY_CARE_PROVIDER_SITE_OTHER): Payer: Medicare Other | Admitting: Internal Medicine

## 2018-03-10 ENCOUNTER — Other Ambulatory Visit: Payer: Self-pay

## 2018-03-10 ENCOUNTER — Encounter: Payer: Self-pay | Admitting: Internal Medicine

## 2018-03-10 VITALS — BP 120/68 | HR 72 | Ht 64.0 in | Wt 218.4 lb

## 2018-03-10 DIAGNOSIS — R188 Other ascites: Secondary | ICD-10-CM

## 2018-03-10 DIAGNOSIS — K7460 Unspecified cirrhosis of liver: Secondary | ICD-10-CM

## 2018-03-10 DIAGNOSIS — K7581 Nonalcoholic steatohepatitis (NASH): Secondary | ICD-10-CM

## 2018-03-10 DIAGNOSIS — R932 Abnormal findings on diagnostic imaging of liver and biliary tract: Secondary | ICD-10-CM | POA: Diagnosis not present

## 2018-03-10 MED ORDER — SPIRONOLACTONE 50 MG PO TABS: 50.0000 mg | ORAL_TABLET | Freq: Every day | ORAL | 3 refills | Status: DC

## 2018-03-10 MED ORDER — SPIRONOLACTONE 50 MG PO TABS: 50.0000 mg | ORAL_TABLET | Freq: Every day | ORAL | 6 refills | Status: DC

## 2018-03-10 MED ORDER — FUROSEMIDE 20 MG PO TABS: 20.0000 mg | ORAL_TABLET | Freq: Every day | ORAL | 3 refills | Status: DC

## 2018-03-10 MED ORDER — FUROSEMIDE 20 MG PO TABS: 20.0000 mg | ORAL_TABLET | Freq: Every day | ORAL | 6 refills | Status: DC

## 2018-03-10 NOTE — Patient Instructions (Addendum)
We have sent the following medications to your pharmacy for you to pick up at your convenience:  Aldactone, Lasix  Per Dr. Henrene Pastor, discontinue your Lisinopril/HZTC  Please follow up with Dr. Henrene Pastor on ___12/01/2018 at 10:00am__________________

## 2018-03-10 NOTE — Progress Notes (Addendum)
HISTORY OF PRESENT ILLNESS:  Crystal Barnett is a 77 y.o. female with multiple medical problems who has been followed in this office for decompensated [place] cirrhosis.  She is also followed by hematology for thrombocytopenia and an indeterminate liver lesion which they are monitoring radiologically.  She was last seen in this office February 17, 2017.  See that dictation.  She completed Twinrix vaccination series.  Last colonoscopy 2008 negative for neoplasia.  She has aged out of routine screening.  The most recent MRI of the abdomen October 28, 2017 revealed a 1.2 cm hypervascular mass in the dome of the right hepatic lobe without significant change from previous.  Moderate ascites.  The patient underwent repeat screening upper endoscopy January 28, 2018.  No varices.  Mild portal hypertensive gastropathy.  Routine screening in 2 years recommended.  She was complaining of progressive abdominal distention for which she underwent ultrasound-guided paracentesis of 5 L.  Tolerated well.  No diuretics other than low-dose Zestoretic.  Her only GI complaint today is more frequent bowel movements.  No fevers, bleeding, or other issues.  She does not have a scale at home in order to weigh herself.  She has been trying to limit her sodium.  Review of laboratories shows no evidence for SBP on her cell fluid analysis.  REVIEW OF SYSTEMS:  All non-GI ROS negative unless otherwise stated in the HPI except for exertional dyspnea  Past Medical History:  Diagnosis Date  . ALLERGIC RHINITIS 01/12/2008  . Allergy   . Cataract    bilateral  . Colon polyps    hyperplastic  . Diabetes ([place])   . Fatty liver   . GOITER, MULTINODULAR 01/12/2008  . Hepatic cirrhosis ([place])   . Hyperlipidemia   . Hypertension   . HYPOTHYROIDISM 09/07/2008  . 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 Osteoporosis   . Splenomegaly   . Thrombocytopenia ([place])    Appears chronic from at least 2014  .  Vitamin D deficiency     Past Surgical History:  Procedure Laterality Date  . arthroscopic left knee  2005  . BREAST BIOPSY Left 2017  . BREAST EXCISIONAL BIOPSY Right 2008  . BREAST SURGERY  2008   Breast Biopsy   . CHOLECYSTECTOMY  1985  . COLONOSCOPY    . IR GENERIC HISTORICAL  04/02/2016   IR RADIOLOGIST EVAL & MGMT 04/02/2016 Jacqulynn Cadet, MD [facility] INTERV RAD  . IR PARACENTESIS  02/02/2018  . IR RADIOLOGIST EVAL & MGMT  10/29/2016  . IR RADIOLOGIST EVAL & MGMT  04/29/2017  . IR RADIOLOGIST EVAL & MGMT  11/04/2017  . POLYPECTOMY    . UPPER GASTROINTESTINAL ENDOSCOPY      Social History Crystal Barnett  reports that she quit smoking about 27 years ago. She has never used smokeless tobacco. She reports that she does not drink alcohol or use drugs.  family history includes Breast cancer in her mother; Goiter in her maternal grandmother; Stroke (age of onset: 76) in her father.  No Known Allergies     PHYSICAL EXAMINATION: Vital signs: BP 120/68   Pulse 72   Ht 5' 4"  (1.626 m)   Wt 218 lb 6.4 oz (99.1 kg)   BMI 37.49 kg/m   Constitutional: Pleasant, obese, elderly , no acute distress Psychiatric: alert and oriented x3, cooperative Eyes: extraocular movements intact, anicteric, conjunctiva pink Mouth: oral pharynx moist, no lesions Neck: supple no  lymphadenopathy Cardiovascular: heart regular rate and rhythm, no murmur Lungs: clear to auscultation bilaterally Abdomen: soft, nontender, nondistended, obese with non-tense but obvious ascites, no peritoneal signs, normal bowel sounds, no organomegaly Rectal: Omitted Extremities: no clubbing or cyanosis.  Trace lower extremity edema bilaterally Skin: no lesions on visible extremities Neuro: No focal deficits. No asterixis.   ASSESSMENT:  1.  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  Recent upper endoscopy with mild portal hypertensive gastropathy.  No varices 3.  Negative screening colonoscopy 2008 4.   Morbid obesity  PLAN:  1.  2 g sodium diet 2.  Stop Zestoretic 3.  Prescribed Lasix 20 mg daily 4.  Prescribed Aldactone 50 mg daily 5.  Basic metabolic panel in 2 weeks 6.  Office follow-up 6 weeks   40 minutes spent face-to-face with the patient.  Greater than 50% the time used for counseling regarding management of her decompensated chronic liver disease.

## 2018-03-12 DIAGNOSIS — E041 Nontoxic single thyroid nodule: Secondary | ICD-10-CM | POA: Diagnosis not present

## 2018-03-12 DIAGNOSIS — K7460 Unspecified cirrhosis of liver: Secondary | ICD-10-CM | POA: Diagnosis not present

## 2018-03-12 DIAGNOSIS — I10 Essential (primary) hypertension: Secondary | ICD-10-CM | POA: Diagnosis not present

## 2018-03-12 DIAGNOSIS — E559 Vitamin D deficiency, unspecified: Secondary | ICD-10-CM | POA: Diagnosis not present

## 2018-03-12 DIAGNOSIS — R188 Other ascites: Secondary | ICD-10-CM | POA: Diagnosis not present

## 2018-03-12 DIAGNOSIS — E039 Hypothyroidism, unspecified: Secondary | ICD-10-CM | POA: Diagnosis not present

## 2018-03-12 DIAGNOSIS — E119 Type 2 diabetes mellitus without complications: Secondary | ICD-10-CM | POA: Diagnosis not present

## 2018-03-12 DIAGNOSIS — D696 Thrombocytopenia, unspecified: Secondary | ICD-10-CM | POA: Diagnosis not present

## 2018-03-12 DIAGNOSIS — M810 Age-related osteoporosis without current pathological fracture: Secondary | ICD-10-CM | POA: Diagnosis not present

## 2018-03-12 DIAGNOSIS — Z23 Encounter for immunization: Secondary | ICD-10-CM | POA: Diagnosis not present

## 2018-03-12 DIAGNOSIS — E1169 Type 2 diabetes mellitus with other specified complication: Secondary | ICD-10-CM | POA: Diagnosis not present

## 2018-03-12 DIAGNOSIS — K7581 Nonalcoholic steatohepatitis (NASH): Secondary | ICD-10-CM | POA: Diagnosis not present

## 2018-03-30 ENCOUNTER — Telehealth: Payer: Self-pay | Admitting: Internal Medicine

## 2018-03-30 NOTE — Telephone Encounter (Signed)
Noted  

## 2018-03-30 NOTE — Telephone Encounter (Signed)
Pt called to inform that she would come to do labs on 04/03/18 as she is out of town at this moment.

## 2018-03-31 ENCOUNTER — Other Ambulatory Visit: Payer: Self-pay | Admitting: Radiology

## 2018-03-31 ENCOUNTER — Encounter: Payer: Self-pay | Admitting: Radiology

## 2018-03-31 ENCOUNTER — Other Ambulatory Visit (HOSPITAL_COMMUNITY): Payer: Self-pay | Admitting: Interventional Radiology

## 2018-03-31 DIAGNOSIS — R160 Hepatomegaly, not elsewhere classified: Secondary | ICD-10-CM

## 2018-04-06 ENCOUNTER — Emergency Department (HOSPITAL_COMMUNITY): Payer: Medicare Other

## 2018-04-06 ENCOUNTER — Other Ambulatory Visit (INDEPENDENT_AMBULATORY_CARE_PROVIDER_SITE_OTHER): Payer: Medicare Other

## 2018-04-06 ENCOUNTER — Encounter (HOSPITAL_COMMUNITY): Payer: Self-pay

## 2018-04-06 ENCOUNTER — Telehealth: Payer: Self-pay | Admitting: Endocrinology

## 2018-04-06 ENCOUNTER — Other Ambulatory Visit: Payer: Self-pay

## 2018-04-06 ENCOUNTER — Emergency Department (HOSPITAL_COMMUNITY)
Admission: EM | Admit: 2018-04-06 | Discharge: 2018-04-07 | Disposition: A | Discharge status: Home / Self Care | Payer: Medicare Other | Attending: Emergency Medicine | Admitting: Emergency Medicine

## 2018-04-06 ENCOUNTER — Telehealth: Payer: Self-pay

## 2018-04-06 DIAGNOSIS — E785 Hyperlipidemia, unspecified: Secondary | ICD-10-CM | POA: Insufficient documentation

## 2018-04-06 DIAGNOSIS — E119 Type 2 diabetes mellitus without complications: Secondary | ICD-10-CM | POA: Insufficient documentation

## 2018-04-06 DIAGNOSIS — S41111A Laceration without foreign body of right upper arm, initial encounter: Secondary | ICD-10-CM

## 2018-04-06 DIAGNOSIS — K7581 Nonalcoholic steatohepatitis (NASH): Secondary | ICD-10-CM

## 2018-04-06 DIAGNOSIS — Z7984 Long term (current) use of oral hypoglycemic drugs: Secondary | ICD-10-CM | POA: Diagnosis not present

## 2018-04-06 DIAGNOSIS — S92514A Nondisplaced fracture of proximal phalanx of right lesser toe(s), initial encounter for closed fracture: Secondary | ICD-10-CM | POA: Insufficient documentation

## 2018-04-06 DIAGNOSIS — W01198A Fall on same level from slipping, tripping and stumbling with subsequent striking against other object, initial encounter: Secondary | ICD-10-CM | POA: Diagnosis not present

## 2018-04-06 DIAGNOSIS — S51811A Laceration without foreign body of right forearm, initial encounter: Secondary | ICD-10-CM | POA: Diagnosis not present

## 2018-04-06 DIAGNOSIS — K7460 Unspecified cirrhosis of liver: Secondary | ICD-10-CM | POA: Diagnosis not present

## 2018-04-06 DIAGNOSIS — Y9389 Activity, other specified: Secondary | ICD-10-CM | POA: Diagnosis not present

## 2018-04-06 DIAGNOSIS — Z23 Encounter for immunization: Secondary | ICD-10-CM | POA: Insufficient documentation

## 2018-04-06 DIAGNOSIS — S51011A Laceration without foreign body of right elbow, initial encounter: Secondary | ICD-10-CM | POA: Diagnosis not present

## 2018-04-06 DIAGNOSIS — I10 Essential (primary) hypertension: Secondary | ICD-10-CM | POA: Insufficient documentation

## 2018-04-06 DIAGNOSIS — Y92008 Other place in unspecified non-institutional (private) residence as the place of occurrence of the external cause: Secondary | ICD-10-CM | POA: Insufficient documentation

## 2018-04-06 DIAGNOSIS — Z87891 Personal history of nicotine dependence: Secondary | ICD-10-CM | POA: Diagnosis not present

## 2018-04-06 DIAGNOSIS — E039 Hypothyroidism, unspecified: Secondary | ICD-10-CM | POA: Diagnosis not present

## 2018-04-06 DIAGNOSIS — Z79899 Other long term (current) drug therapy: Secondary | ICD-10-CM | POA: Insufficient documentation

## 2018-04-06 DIAGNOSIS — Y998 Other external cause status: Secondary | ICD-10-CM | POA: Diagnosis not present

## 2018-04-06 DIAGNOSIS — R188 Other ascites: Principal | ICD-10-CM

## 2018-04-06 DIAGNOSIS — S90121A Contusion of right lesser toe(s) without damage to nail, initial encounter: Secondary | ICD-10-CM | POA: Diagnosis not present

## 2018-04-06 LAB — BASIC METABOLIC PANEL
BUN: 12 mg/dL (ref 6–23)
CO2: 29 meq/L (ref 19–32)
Calcium: 9.1 mg/dL (ref 8.4–10.5)
Chloride: 102 mEq/L (ref 96–112)
Creatinine, Ser: 0.7 mg/dL (ref 0.40–1.20)
GFR: 86 mL/min (ref >60.00)
Glucose, Bld: 97 mg/dL (ref 70–99)
POTASSIUM: 4.3 meq/L (ref 3.5–5.1)
SODIUM: 139 meq/L (ref 135–145)

## 2018-04-06 MED ORDER — LIDOCAINE-EPINEPHRINE 2 %-1:100000 IJ SOLN: 20.0000 mL | Freq: Once | INTRAMUSCULAR | Status: DC

## 2018-04-06 MED ORDER — TETANUS-DIPHTH-ACELL PERTUSSIS 5-2.5-18.5 LF-MCG/0.5 IM SUSP
0.5000 mL | Freq: Once | INTRAMUSCULAR | Status: AC
  Administered 2018-04-06: 0.5 mL via INTRAMUSCULAR
  Filled 2018-04-06: qty 0.5

## 2018-04-06 MED ORDER — LIDOCAINE HCL (PF) 1 % IJ SOLN
30.0000 mL | Freq: Once | INTRAMUSCULAR | Status: AC
  Administered 2018-04-07: 30 mL via INTRADERMAL
  Filled 2018-04-06: qty 30

## 2018-04-06 NOTE — Telephone Encounter (Signed)
2 diagnoses: Multinodular goiter ("lumpy thyroid") Hypothyroidism (underactive thyroid)

## 2018-04-06 NOTE — Telephone Encounter (Signed)
Called pt to make her aware. Asked that I mail this information in a letter. Mailed today.

## 2018-04-06 NOTE — Telephone Encounter (Signed)
Please advise 

## 2018-04-06 NOTE — ED Notes (Signed)
Bed: [facility] Expected date:  Expected time:  Means of arrival:  Comments: 

## 2018-04-06 NOTE — ED Provider Notes (Signed)
[facility] DEPT Provider Note   CSN: [id number] Arrival date & time: 04/06/18  1545     History   Chief Complaint Chief Complaint  Patient presents with  . Laceration    HPI Crystal Barnett is a 77 y.o. female.  The history is provided by the patient and medical records.  Laceration       77 y.o. F with hx of allergies, cataracts, DM, HLP, HTN, osteoporosis, presenting to the ED for laceration.  Patient states she was trying to get a box down out of her closet and it was too heavy causing her to stumble and fell into a metal shelf.  She sustained laceration to right elbow.  States box then fell onto right foot and has a lot of bruising on the 5th toe.  She denies any numbness/weakness of extremities.  No reported head injury or LOC.  Last tetanus vaccine 2007.  Past Medical History:  Diagnosis Date  . ALLERGIC RHINITIS 01/12/2008  . Allergy   . Cataract    bilateral  . Colon polyps    hyperplastic  . Diabetes ([place])   . Fatty liver   . GOITER, MULTINODULAR 01/12/2008  . Hepatic cirrhosis ([place])   . Hyperlipidemia   . Hypertension   . HYPOTHYROIDISM 09/07/2008  . 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 Osteoporosis   . Splenomegaly   . Thrombocytopenia ([place])    Appears chronic from at least 2014  . Vitamin D deficiency     Patient Active Problem List   Diagnosis Date Noted  . 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 Thrombocytopenia ([place]) 04/19/2015  . Liver lesion, left lobe 04/19/2015  . 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 Arrhythmia 04/23/2013  . Hypothyroidism 09/07/2008  . GOITER, MULTINODULAR 01/12/2008  . ALLERGIC RHINITIS 01/12/2008  . CHICKENPOX, HX OF 01/12/2008    Past Surgical History:  Procedure Laterality Date  . arthroscopic left knee  2005  . BREAST BIOPSY Left 2017  . BREAST EXCISIONAL BIOPSY Right 2008  . BREAST SURGERY  2008   Breast Biopsy   .  CHOLECYSTECTOMY  1985  . COLONOSCOPY    . IR GENERIC HISTORICAL  04/02/2016   IR RADIOLOGIST EVAL & MGMT 04/02/2016 Jacqulynn Cadet, MD [facility] INTERV RAD  . IR PARACENTESIS  02/02/2018  . IR RADIOLOGIST EVAL & MGMT  10/29/2016  . IR RADIOLOGIST EVAL & MGMT  04/29/2017  . IR RADIOLOGIST EVAL & MGMT  11/04/2017  . POLYPECTOMY    . UPPER GASTROINTESTINAL ENDOSCOPY       OB History   None      Home Medications    Prior to Admission medications   Medication Sig Start Date End Date Taking? Authorizing Provider  alendronate (FOSAMAX) 70 MG tablet Take 70 mg by mouth once a week. On Saturday 03/24/17  Yes [provider]  Cholecalciferol (VITAMIN D) 50 MCG (2000 UT) CAPS Take 2,000 Units by mouth at bedtime.   Yes [provider]  fluticasone (FLONASE) 50 MCG/ACT nasal spray Place 1 spray into both nostrils daily.    Yes [provider]  furosemide (LASIX) 20 MG tablet Take 1 tablet (20 mg total) by mouth daily. 03/10/18  Yes Irene Shipper, MD  Lactobacillus (PROBIOTIC ACIDOPHILUS) CAPS Take 1 capsule by mouth 2 (two) times a week.    Yes [provider]  levothyroxine (SYNTHROID, [place]) 50  MCG tablet TAKE 1 TABLET EVERY DAY Patient taking differently: Take 50 mcg by mouth daily.  11/06/17  Yes Renato Shin, MD  metFORMIN (GLUCOPHAGE) 500 MG tablet Take 500 mg by mouth every evening. Take 1 tablet by mouth once daily    Yes [provider]  montelukast (SINGULAIR) 10 MG tablet Take 10 mg by mouth at bedtime.  03/25/14  Yes [provider]  Multiple Vitamin (MULTIVITAMIN) tablet Take 1 tablet by mouth at bedtime.    Yes [provider]  spironolactone (ALDACTONE) 50 MG tablet Take 1 tablet (50 mg total) by mouth daily. 03/10/18  Yes Irene Shipper, MD  Blood Glucose Monitoring Suppl ([place]) w/Device KIT OneTouch Verio System  USE TO TEST ONCE D UTD    [provider]    Family History Family  History  Problem Relation Age of Onset  . Goiter Maternal Grandmother   . Stroke Father 33  . Breast cancer Mother        in 27's  . Colon cancer Neg Hx   . Colon polyps Neg Hx   . Esophageal cancer Neg Hx   . Stomach cancer Neg Hx   . Rectal cancer Neg Hx     Social History Social History   Tobacco Use  . Smoking status: Former Smoker    Last attempt to quit: 05/13/1990    Years since quitting: 27.9  . Smokeless tobacco: Never Used  Substance Use Topics  . Alcohol use: No    Alcohol/week: 0.0 standard drinks  . Drug use: No     Allergies   Patient has no known allergies.   Review of Systems Review of Systems  Musculoskeletal: Positive for arthralgias.  Skin: Positive for wound.  All other systems reviewed and are negative.    Physical Exam Updated Vital Signs BP (!) 160/87 (BP Location: Left Arm)   Pulse 81   Temp 98.3 F (36.8 C) (Oral)   Resp 20   SpO2 99%   Physical Exam  Constitutional: She is oriented to person, place, and time. She appears well-developed and well-nourished.  HENT:  Head: Normocephalic and atraumatic.  Mouth/Throat: Oropharynx is clear and moist.  Eyes: Pupils are equal, round, and reactive to light. Conjunctivae and EOM are normal.  Neck: Normal range of motion.  Cardiovascular: Normal rate, regular rhythm and normal heart sounds.  Pulmonary/Chest: Effort normal and breath sounds normal. No stridor. No respiratory distress.  Abdominal: Soft. Bowel sounds are normal. There is no tenderness. There is no rebound.  Musculoskeletal: Normal range of motion.  L-shaped laceration to right elbow, somewhat deep, approx 8cm; has some areas of skin tear along corner of the wound; no active bleeding, full ROM of elbow, no bony deformities; arm is NVI  Right 5th toe is diffusely bruised and tender to palpation; foot is otherwise non-tender, she remains ambulatory  Neurological: She is alert and oriented to person, place, and time.  Skin: Skin is  warm and dry.  Psychiatric: She has a normal mood and affect.  Nursing note and vitals reviewed.    ED Treatments / Results  Labs (all labs ordered are listed, but only abnormal results are displayed) Labs Reviewed - No data to display  EKG None  Radiology Dg Forearm Right  Result Date: 04/06/2018 CLINICAL DATA:  Laceration EXAM: RIGHT FOREARM - 2 VIEW COMPARISON:  None. FINDINGS: No fracture or malalignment. No radiopaque foreign body in the soft tissues. IMPRESSION: No acute osseous abnormality. Electronically  Signed   By: Donavan Foil M.D.   On: 04/06/2018 18:05   Dg Toe 5th Right  Result Date: 04/06/2018 CLINICAL DATA:  Injury with bruising EXAM: RIGHT FIFTH TOE COMPARISON:  None. FINDINGS: Possible fracture dorsal base of the fifth middle phalanx. Questionable fracture base of the fourth distal phalanx. No subluxation or radiopaque foreign body. IMPRESSION: 1. Possible acute fracture at the dorsal base of the fifth middle phalanx. 2. Questionable fracture base of the fourth distal phalanx for which correlation for point tenderness is recommended Electronically Signed   By: Donavan Foil M.D.   On: 04/06/2018 23:57    Procedures Procedures (including critical care time)  LACERATION REPAIR Performed by: Larene Pickett Authorized by: Larene Pickett Consent: Verbal consent obtained. Risks and benefits: risks, benefits and alternatives were discussed Consent given by: patient Patient identity confirmed: provided demographic data Prepped and Draped in normal sterile fashion Wound explored  Laceration Location: right elbow  Laceration Length: 8cm  No Foreign Bodies seen or palpated  Anesthesia: local infiltration  Local anesthetic: lidocaine 1% without epinephrine  Anesthetic total: 10 ml  Irrigation method: syringe Amount of cleaning: standard  Skin closure: 4-0 prolene  Number of sutures: 8  Technique: simple interrupted  Patient tolerance: Patient  tolerated the procedure well with no immediate complications.   Medications Ordered in ED Medications  Tdap (BOOSTRIX) injection 0.5 mL (0.5 mLs Intramuscular Given 04/06/18 2358)  lidocaine (PF) (XYLOCAINE) 1 % injection 30 mL (30 mLs Intradermal Given 04/07/18 0000)     Initial Impression / Assessment and Plan / ED Course  I have reviewed the triage vital signs and the nursing notes.  Pertinent labs & imaging results that were available during my care of the patient were reviewed by me and considered in my medical decision making (see chart for details).  77 year old female here with laceration of the right elbow.  She was trying to get a box out of the top of the closet but was too heavy causing her to fall forward and struck her elbow on a metal shelf.  No head injury or LOC.  Box then fell onto foot.  Patient has 8 cm, L-shaped laceration of the right elbow that is somewhat deep but no active bleeding.  She does not have any bony deformities.  Does have diffuse bruising and some mild swelling of the right fifth digit.  X-rays of the arm is negative.  X-ray of the toe with possible acute fracture of the base of the fifth toe.  Toes were buddy taped.  Laceration repaired as above, patient tolerated well.  Tetanus was updated.  Discharge home with wound care instructions.  Close follow-up with PCP for suture removal in 7 to 10 days.  She will return here for any new or worsening symptoms.  Final Clinical Impressions(s) / ED Diagnoses   Final diagnoses:  Laceration of right upper extremity, initial encounter  Closed nondisplaced fracture of proximal phalanx of lesser toe of right foot, initial encounter    ED Discharge Orders    None       Larene Pickett, PA-C 04/07/18 0034    Malvin Johns, MD 04/09/18 [phone]

## 2018-04-06 NOTE — Telephone Encounter (Signed)
Patient called to request the diagnoses codes Dr. Loanne Drilling uses for patient and what they mean. Please call patient at ph# [phone] to advise.

## 2018-04-06 NOTE — ED Triage Notes (Addendum)
Patient c/o of lacertation on right arm. Patient was moving things in closet and hit her arm on steel shelving. Patient also c/o of toe pain from unknown object falling on pink toe on right foot.   On assessment Large laceration near elbow needing sutures. Approximately 1/4 inch deep.   A/ox4 Ambulatory in triage.

## 2018-04-06 NOTE — Telephone Encounter (Signed)
Letter mailed

## 2018-04-07 DIAGNOSIS — S51011A Laceration without foreign body of right elbow, initial encounter: Secondary | ICD-10-CM | POA: Diagnosis not present

## 2018-04-07 NOTE — Discharge Instructions (Signed)
Keep wound clean and dry.  Sutures will need to be taken out in about 7-10 days.  Follow-up with your primary care doctor for this. Your x-ray did show a possible fracture at the base of your 5th toe, you can follow-up with your primary care doctor about this too.  Can keep them "buddy taped" together for comfort if needed. Return to the ED for new or worsening symptoms.

## 2018-04-07 NOTE — ED Notes (Signed)
Bed: WA07 Expected date:  Expected time:  Means of arrival:  Comments: Sterile procedure

## 2018-04-14 ENCOUNTER — Other Ambulatory Visit: Payer: Self-pay | Admitting: *Deleted

## 2018-04-14 ENCOUNTER — Inpatient Hospital Stay: Payer: Medicare Other | Attending: Hematology

## 2018-04-14 DIAGNOSIS — C228 Malignant neoplasm of liver, primary, unspecified as to type: Secondary | ICD-10-CM

## 2018-04-14 LAB — CMP (CANCER CENTER ONLY)
ALT: 13 U/L (ref 0–44)
ANION GAP: 8 (ref 5–15)
AST: 21 U/L (ref 15–41)
Albumin: 3.4 g/dL — ABNORMAL LOW (ref 3.5–5.0)
Alkaline Phosphatase: 79 U/L (ref 38–126)
BUN: 12 mg/dL (ref 8–23)
CO2: 26 mmol/L (ref 22–32)
Calcium: 9 mg/dL (ref 8.9–10.3)
Chloride: 104 mmol/L (ref 98–111)
Creatinine: 0.71 mg/dL (ref 0.44–1.00)
GFR, Est AFR Am: >60 mL/min (ref >60)
GFR, Estimated: >60 mL/min (ref >60)
Glucose, Bld: 124 mg/dL — ABNORMAL HIGH (ref 70–99)
Potassium: 4.3 mmol/L (ref 3.5–5.1)
Sodium: 138 mmol/L (ref 135–145)
Total Bilirubin: 0.6 mg/dL (ref 0.3–1.2)
Total Protein: 6.6 g/dL (ref 6.5–8.1)

## 2018-04-14 LAB — CBC WITH DIFFERENTIAL (CANCER CENTER ONLY)
Abs Immature Granulocytes: 0.01 10*3/uL (ref 0.00–0.07)
Basophils Absolute: 0 10*3/uL (ref 0.0–0.1)
Basophils Relative: 1 %
EOS ABS: 0.1 10*3/uL (ref 0.0–0.5)
EOS PCT: 2 %
HCT: 38.5 % (ref 36.0–46.0)
Hemoglobin: 12.5 g/dL (ref 12.0–15.0)
Immature Granulocytes: 0 %
Lymphocytes Relative: 15 %
Lymphs Abs: 0.6 10*3/uL — ABNORMAL LOW (ref 0.7–4.0)
MCH: 28.2 pg (ref 26.0–34.0)
MCHC: 32.5 g/dL (ref 30.0–36.0)
MCV: 86.9 fL (ref 80.0–100.0)
Monocytes Absolute: 0.3 10*3/uL (ref 0.1–1.0)
Monocytes Relative: 8 %
Neutro Abs: 3.2 10*3/uL (ref 1.7–7.7)
Neutrophils Relative %: 74 %
Platelet Count: 135 10*3/uL — ABNORMAL LOW (ref 150–400)
RBC: 4.43 MIL/uL (ref 3.87–5.11)
RDW: 13.5 % (ref 11.5–15.5)
WBC Count: 4.2 10*3/uL (ref 4.0–10.5)
nRBC: 0 % (ref 0.0–0.2)

## 2018-04-14 LAB — PROTIME-INR
INR: 1.03
Prothrombin Time: 13.4 seconds (ref 11.4–15.2)

## 2018-04-15 ENCOUNTER — Telehealth: Payer: Self-pay | Admitting: Internal Medicine

## 2018-04-15 ENCOUNTER — Other Ambulatory Visit: Payer: Self-pay

## 2018-04-15 DIAGNOSIS — R188 Other ascites: Secondary | ICD-10-CM

## 2018-04-15 LAB — AFP TUMOR MARKER: AFP, SERUM, TUMOR MARKER: 3.7 ng/mL (ref 0.0–8.3)

## 2018-04-15 NOTE — Telephone Encounter (Signed)
Patient requesting to speak with nurse regarding scheduling another paracentesis due to fluid in her stomach.

## 2018-04-15 NOTE — Telephone Encounter (Signed)
Pt scheduled for IR para at [facility] 04/20/18@9am . Pt to arrive there at 8:45am. Max 8 liters to be drawn off, pt to receive 8gm albumin for each liter removed. Pt aware of appt. Will call pt Wednesday to go over med changes on Thursday 04/23/18.

## 2018-04-15 NOTE — Telephone Encounter (Signed)
Pt called and states she has accumulated more fluid in her abdomen and is requesting a paracentesis. Pt states she cannot weigh herself. States she is uncomfortable and cannot lay on her back now due to the fluid. Please advise.

## 2018-04-15 NOTE — Telephone Encounter (Signed)
1.  Arrange large-volume paracentesis up to 8 L with albumin replacement at 8 g/L removed.  Send fluid for cell count with differential 2.  3 days after bleeding her paracentesis, I want to increase her Lasix from 20 mg daily to 40 mg daily.  Also, increase Aldactone from 50 mg daily to 100 mg daily 3.  Repeat basic metabolic panel 2 weeks after initiating the diuretic change 4.  Reiterated the importance of 2 g sodium diet

## 2018-04-16 ENCOUNTER — Ambulatory Visit (HOSPITAL_COMMUNITY)
Admission: RE | Admit: 2018-04-16 | Discharge: 2018-04-16 | Disposition: A | Discharge status: Home / Self Care | Payer: Medicare Other | Source: Ambulatory Visit | Attending: Interventional Radiology | Admitting: Interventional Radiology

## 2018-04-16 ENCOUNTER — Ambulatory Visit
Admission: RE | Admit: 2018-04-16 | Discharge: 2018-04-16 | Disposition: A | Discharge status: Home / Self Care | Payer: Medicare Other | Source: Ambulatory Visit | Attending: Interventional Radiology | Admitting: Interventional Radiology

## 2018-04-16 DIAGNOSIS — R160 Hepatomegaly, not elsewhere classified: Secondary | ICD-10-CM

## 2018-04-16 DIAGNOSIS — K7581 Nonalcoholic steatohepatitis (NASH): Secondary | ICD-10-CM | POA: Diagnosis not present

## 2018-04-16 DIAGNOSIS — K7689 Other specified diseases of liver: Secondary | ICD-10-CM | POA: Diagnosis not present

## 2018-04-16 DIAGNOSIS — R188 Other ascites: Secondary | ICD-10-CM | POA: Diagnosis not present

## 2018-04-16 DIAGNOSIS — N951 Menopausal and female climacteric states: Secondary | ICD-10-CM | POA: Insufficient documentation

## 2018-04-16 DIAGNOSIS — K7460 Unspecified cirrhosis of liver: Secondary | ICD-10-CM | POA: Diagnosis not present

## 2018-04-16 HISTORY — PX: IR RADIOLOGIST EVAL & MGMT: IMG5224

## 2018-04-16 MED ORDER — GADOXETATE DISODIUM 0.25 MMOL/ML IV SOLN
8.0000 mL | Freq: Once | INTRAVENOUS | Status: AC | PRN
  Administered 2018-04-16: 8 mL via INTRAVENOUS

## 2018-04-16 NOTE — Progress Notes (Signed)
Chief Complaint: Patient was seen in follow-up today for liver lesion at the request of Cleatus Gabriel  Referring Physician(s): Dr. Irene Limbo  History of Present Illness: Crystal Barnett is a 77 y.o. female with a history ofNASHcirrhosis comlicatedby thrombocytopeniaand minimal ascites. MRI imaging was performed first in December 2016 to evaluate a region of possible concern in the left hepatic lobe. On that study, there was a small 9 mm enhancing focus in hepatic segment 7 in the posterosuperior aspect of the hepatic dome. This subsequently been followed at 6 month intervals with repeat MRI scans of the abdomen. On the scan which was performed on 02/27/2016, the lesion measures 10 mm compared to 9 mm nearly one year previously. However, the lesion does appear slightly more prominent and demonstrates enhancement characteristics which are concerning for hepatocellular carcinoma.Imaging on 10/22/2016, this area of concern showed mild interval enlargementto 1.7 cm. We elected to continue observation despite the slow growth and her most recent MR imaging from 04/23/17 demonstrates stability at 1.6 cm.   MRI dated 10/28/2017 demonstrates continued stability of the lesion.  In fact, on today's examination it measures approximately 1.2 cm.  The slight decrease in size may represent differences in measurement technique rather than true involution.  She now presents today for follow-up imaging after a 38-monthinterval.  Her MRI today demonstrates more definitive enlargement of the lesion now measuring up to 1.9 cm.  Additionally, there appears to be an enhancing pseudocapsule.  On the present study, the imaging characteristics are consistent with a lie RADS 5 lesion which is diagnostic of hepatocellular carcinoma.  The very slow growth rate over the past 3 years suggests a low-grade or indolent malignancy at this time.  Clinically, the patient is doing fairly well. Her ascites has progressed since her  last visit and she has now required several paracenteses.  She was recently taken off of her ACE inhibitor and started on Lasix and spironolactone.  Past Medical History:  Diagnosis Date  . ALLERGIC RHINITIS 01/12/2008  . Allergy   . Cataract    bilateral  . Colon polyps    hyperplastic  . Diabetes ([place]   . Fatty liver   . GOITER, MULTINODULAR 01/12/2008  . Hepatic cirrhosis ([place]   . Hyperlipidemia   . Hypertension   . HYPOTHYROIDISM 09/07/2008  . 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 Osteoporosis   . Splenomegaly   . Thrombocytopenia ([place]    Appears chronic from at least 2014  . Vitamin D deficiency     Past Surgical History:  Procedure Laterality Date  . arthroscopic left knee  2005  . BREAST BIOPSY Left 2017  . BREAST EXCISIONAL BIOPSY Right 2008  . BREAST SURGERY  2008   Breast Biopsy   . CHOLECYSTECTOMY  1985  . COLONOSCOPY    . IR GENERIC HISTORICAL  04/02/2016   IR RADIOLOGIST EVAL & MGMT 04/02/2016 HJacqulynn Cadet MD [facility] INTERV RAD  . IR PARACENTESIS  02/02/2018  . IR RADIOLOGIST EVAL & MGMT  10/29/2016  . IR RADIOLOGIST EVAL & MGMT  04/29/2017  . IR RADIOLOGIST EVAL & MGMT  11/04/2017  . IR RADIOLOGIST EVAL & MGMT  04/16/2018  . POLYPECTOMY    . UPPER GASTROINTESTINAL ENDOSCOPY      Allergies: Patient has no known allergies.  Medications: Prior to Admission medications   Medication Sig Start Date End Date Taking? Authorizing Provider  alendronate (FOSAMAX) 70  MG tablet Take 70 mg by mouth once a week. On Saturday 03/24/17   [provider]  Blood Glucose Monitoring Suppl ([place]) w/Device KIT OneTouch Verio System  USE TO TEST ONCE D UTD    [provider]  Cholecalciferol (VITAMIN D) 50 MCG (2000 UT) CAPS Take 2,000 Units by mouth at bedtime.    [provider]  fluticasone (FLONASE) 50 MCG/ACT nasal spray Place 1 spray into both nostrils daily.     [provider]  furosemide (LASIX) 20 MG tablet Take 1 tablet (20 mg total) by mouth daily. 03/10/18   Irene Shipper, MD  Lactobacillus (PROBIOTIC ACIDOPHILUS) CAPS Take 1 capsule by mouth 2 (two) times a week.     [provider]  levothyroxine (SYNTHROID, LEVOTHROID) 50 MCG tablet TAKE 1 TABLET EVERY DAY Patient taking differently: Take 50 mcg by mouth daily.  11/06/17   Renato Shin, MD  metFORMIN (GLUCOPHAGE) 500 MG tablet Take 500 mg by mouth every evening. Take 1 tablet by mouth once daily     [provider]  montelukast (SINGULAIR) 10 MG tablet Take 10 mg by mouth at bedtime.  03/25/14   [provider]  Multiple Vitamin (MULTIVITAMIN) tablet Take 1 tablet by mouth at bedtime.     [provider]  spironolactone (ALDACTONE) 50 MG tablet Take 1 tablet (50 mg total) by mouth daily. 03/10/18   Irene Shipper, MD     Family History  Problem Relation Age of Onset  . Goiter Maternal Grandmother   . Stroke Father 36  . Breast cancer Mother        in 25's  . Colon cancer Neg Hx   . Colon polyps Neg Hx   . Esophageal cancer Neg Hx   . Stomach cancer Neg Hx   . Rectal cancer Neg Hx     Social History   Socioeconomic History  . Marital status: Single    Spouse name: Not on file  . Number of children: Not on file  . Years of education: Not on file  . Highest education level: Not on file  Occupational History  . Occupation: Chiropodist    Comment: works in Chiropodist for [employer]  . Financial resource strain: Not on file  . Food insecurity:    Worry: Not on file    Inability: Not on file  . Transportation needs:    Medical: Not on file    Non-medical: Not on file  Tobacco Use  . Smoking status: Former Smoker    Last attempt to quit: 05/13/1990    Years since quitting: 27.9  . Smokeless tobacco: Never Used  Substance and Sexual Activity  . Alcohol use: No    Alcohol/week: 0.0 standard drinks  . Drug use:  No  . Sexual activity: Not on file  Lifestyle  . Physical activity:    Days per week: Not on file    Minutes per session: Not on file  . Stress: Not on file  Relationships  . Social connections:    Talks on phone: Not on file    Gets together: Not on file    Attends religious service: Not on file    Active member of club or organization: Not on file    Attends meetings of clubs or organizations: Not on file    Relationship status: Not on file  Other Topics Concern  . Not on file  Social History  Narrative  . Not on file    ECOG Status: 0 - Asymptomatic  Review of Systems: A 12 point ROS discussed and pertinent positives are indicated in the HPI above.  All other systems are negative.  Review of Systems  Vital Signs: BP (!) 177/82 (BP Location: Left Wrist, Patient Position: Sitting, Cuff Size: Normal)   Pulse 80   Temp 97.9 F (36.6 C)   Resp 18   SpO2 97%   Physical Exam  Constitutional: She is oriented to person, place, and time. She appears well-developed and well-nourished. No distress.  HENT:  Head: Normocephalic and atraumatic.  Eyes: No scleral icterus.  Cardiovascular: Normal rate.  Pulmonary/Chest: Effort normal.  Abdominal: Soft. She exhibits distension.  Neurological: She is alert and oriented to person, place, and time.  Skin: Skin is warm and dry.  Psychiatric: She has a normal mood and affect. Her behavior is normal.  Nursing note and vitals reviewed.    Imaging: Dg Forearm Right  Result Date: 04/06/2018 CLINICAL DATA:  Laceration EXAM: RIGHT FOREARM - 2 VIEW COMPARISON:  None. FINDINGS: No fracture or malalignment. No radiopaque foreign body in the soft tissues. IMPRESSION: No acute osseous abnormality. Electronically Signed   By: Donavan Foil M.D.   On: 04/06/2018 18:05   Mr Abdomen Wwo Contrast  Result Date: 04/16/2018 CLINICAL DATA:  Cirrhosis and liver mass.  Follow-up exam. EXAM: MRI ABDOMEN WITHOUT AND WITH CONTRAST TECHNIQUE: Multiplanar  multisequence MR imaging of the abdomen was performed both before and after the administration of intravenous contrast. CONTRAST:  19m EOVIST GADOXETATE DISODIUM 0.25 MOL/L IV SOLN COMPARISON:  10/28/2017 FINDINGS: Lower chest: No acute findings. Hepatobiliary: Hepatic cirrhosis is again demonstrated. Hypervascular liver lesion within the posterior dome is again noted. On today's study this measures 1.9 by 1.3 cm, image 31/501. Previously 1.4 x 1.0 cm. On the portal venous phase there is an enhancing capsule and on the delayed images there is a washout from this lesion. 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 No new hyperenhancing liver lesions identified. Previous cholecystectomy. No biliary dilatation. Pancreas: No mass, inflammatory changes, or other parenchymal abnormality identified. Spleen: The spleen is enlarged with a cranial caudal dimension 15.7 cm, image 29/12. Adrenals/Urinary Tract: No masses identified. No evidence of hydronephrosis. Stomach/Bowel: Visualized portions within the abdomen are unremarkable. Vascular/Lymphatic: No pathologically enlarged lymph nodes identified. No abdominal aortic aneurysm demonstrated. Small esophageal varices identified. The portal vein remains patent. Other: Large volume of ascites identified. Increased from previous exam. Musculoskeletal: No suspicious bone lesions identified. IMPRESSION: 1. There has been mild increase in size of hypervascular liver lesion within posterior dome. 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 No new lesions identified. 2. Morphologic features of liver compatible with cirrhosis with stigmata of portal venous hypertension. Electronically Signed   By: TKerby MoorsM.D.   On: 04/16/2018 10:35   Dg Toe 5th Right  Result Date: 04/06/2018 CLINICAL DATA:  Injury with bruising EXAM: RIGHT FIFTH TOE COMPARISON:  None. FINDINGS: Possible fracture dorsal base of the fifth middle phalanx. Questionable fracture  base of the fourth distal phalanx. No subluxation or radiopaque foreign body. IMPRESSION: 1. Possible acute fracture at the dorsal base of the fifth middle phalanx. 2. Questionable fracture base of the fourth distal phalanx for which correlation for point tenderness is recommended Electronically Signed   By: KDonavan FoilM.D.   On: 04/06/2018 23:57   Ir Radiologist Eval & Mgmt  Result Date: 04/16/2018 Please refer to notes tab for  details about interventional procedure. (Op Note)   Labs:  CBC: Recent Labs    06/04/17 0852 10/23/17 1027 12/02/17 0856 04/14/18 1239  WBC 3.1* 4.3 4.5 4.2  HGB 12.9 13.2 13.2 12.5  HCT 38.6 39.5 39.3 38.5  PLT 87* 111* 124* 135*    COAGS: Recent Labs    04/18/17 1210 04/14/18 1239  INR 1.1 1.03    BMP: Recent Labs    06/04/17 0852 10/23/17 1027 12/02/17 0856 04/06/18 0739 04/14/18 1239  NA 140 140 141 139 138  K 3.9 4.0 4.1 4.3 4.3  CL 104 104 105 102 104  CO2 29 26 27 29 26   GLUCOSE 200* 104 112* 97 124*  BUN 10 10 10 12 12   CALCIUM 8.9 9.2 9.2 9.1 9.0  CREATININE 0.78 0.69 0.68 0.70 0.71  GFRNONAA >60 >60 >60  --  >60  GFRAA >60 >60 >60  --  >60    LIVER FUNCTION TESTS: Recent Labs    06/04/17 0852 10/23/17 1027 12/02/17 0856 04/14/18 1239  BILITOT 0.4 0.7 0.6 0.6  AST 31 28 29 21   ALT 26 16 19 13   ALKPHOS 65 68 70 79  PROT 6.6 6.7 6.8 6.6  ALBUMIN 3.6 3.7 3.7 3.4*    TUMOR MARKERS: Recent Labs    04/18/17 1210  AFPTM 5.4    Assessment and Plan:  We have been following this lesion for the past 3 years and it finally meets imaging criteria consistent with hepatocellular carcinoma.  The lesion is still quite small at 1.9 cm, and unfortunately Crystal Barnett has developed more symptomatic ascites over the last several months which is a risk factor for percutaneous microwave ablation (portal hypertension, increased risk of bleeding in the presence of ascites).  Given the small size of the lesion and the indolent  course over the past 3 years, our current plan is to proceed with a CT arteriogram of the abdomen in 3 months to assess the arterial supply of the lesion.  We will then follow this with a chemoembolization using either Lumi beads or conventional lipiodol in an effort to stain the lesion.  Finally, we will plan to offer definitive microwave therapy once the lesion is more visible and her ascites has been controlled.  1.) Liver protocol CT scan of the abdomen in 3 months with return clinic visit. 2.)  Plan for conventional TACE followed by microwave ablation    Electronically Signed: [place], [place] 04/16/2018, 12:46 PM   I spent a total of  15 Minutes in face to face in clinical consultation, greater than 50% of which was counseling/coordinating care for putative hepatocellular carcinoma.

## 2018-04-20 ENCOUNTER — Encounter (HOSPITAL_COMMUNITY): Payer: Self-pay | Admitting: Physician Assistant

## 2018-04-20 ENCOUNTER — Ambulatory Visit (HOSPITAL_COMMUNITY)
Admission: RE | Admit: 2018-04-20 | Discharge: 2018-04-20 | Disposition: A | Discharge status: Home / Self Care | Payer: Medicare Other | Source: Ambulatory Visit | Attending: Internal Medicine | Admitting: Internal Medicine

## 2018-04-20 ENCOUNTER — Ambulatory Visit: Payer: Medicare Other | Admitting: Internal Medicine

## 2018-04-20 ENCOUNTER — Other Ambulatory Visit (INDEPENDENT_AMBULATORY_CARE_PROVIDER_SITE_OTHER): Payer: Medicare Other

## 2018-04-20 DIAGNOSIS — R188 Other ascites: Secondary | ICD-10-CM

## 2018-04-20 HISTORY — PX: IR PARACENTESIS: IMG2679

## 2018-04-20 LAB — BODY FLUID CELL COUNT WITH DIFFERENTIAL
Eos, Fluid: 0 %
Lymphs, Fluid: 63 %
Monocyte-Macrophage-Serous Fluid: 19 % — ABNORMAL LOW (ref 50–90)
Neutrophil Count, Fluid: 18 % (ref 0–25)
WBC FLUID: 328 uL (ref 0–1000)

## 2018-04-20 LAB — BASIC METABOLIC PANEL
BUN: 11 mg/dL (ref 6–23)
CO2: 29 mEq/L (ref 19–32)
Calcium: 9.1 mg/dL (ref 8.4–10.5)
Chloride: 103 mEq/L (ref 96–112)
Creatinine, Ser: 0.64 mg/dL (ref 0.40–1.20)
GFR: 95.36 mL/min (ref >60.00)
Glucose, Bld: 79 mg/dL (ref 70–99)
Potassium: 3.8 mEq/L (ref 3.5–5.1)
Sodium: 140 mEq/L (ref 135–145)

## 2018-04-20 MED ORDER — LIDOCAINE HCL 1 % IJ SOLN
INTRAMUSCULAR | Status: AC
  Filled 2018-04-20: qty 20

## 2018-04-20 MED ORDER — ALBUMIN HUMAN 25 % IV SOLN
50.0000 g | Freq: Once | INTRAVENOUS | Status: AC
  Administered 2018-04-20: 50 g via INTRAVENOUS

## 2018-04-20 MED ORDER — LIDOCAINE HCL 1 % IJ SOLN
INTRAMUSCULAR | Status: DC | PRN
  Administered 2018-04-20: 10 mL

## 2018-04-20 MED ORDER — ALBUMIN HUMAN 25 % IV SOLN
INTRAVENOUS | Status: AC
  Filled 2018-04-20: qty 200

## 2018-04-20 NOTE — Procedures (Signed)
PROCEDURE SUMMARY:  Successful US guided paracentesis from left lateral abdomen.  Yielded 8 L of cloudy fluid.  No immediate complications.  Patient tolerated well.  EBL trace   Specimen was sent for labs.  Judie Grieve Meliza Kage PA-C 04/20/2018 12:32 PM

## 2018-04-21 DIAGNOSIS — S51011D Laceration without foreign body of right elbow, subsequent encounter: Secondary | ICD-10-CM | POA: Diagnosis not present

## 2018-04-21 LAB — PATHOLOGIST SMEAR REVIEW: Path Review: 121019

## 2018-04-22 MED ORDER — FUROSEMIDE 40 MG PO TABS: 40.0000 mg | ORAL_TABLET | Freq: Every day | ORAL | 3 refills | Status: DC

## 2018-04-22 MED ORDER — SPIRONOLACTONE 100 MG PO TABS: 100.0000 mg | ORAL_TABLET | Freq: Every day | ORAL | 3 refills | Status: DC

## 2018-04-22 NOTE — Telephone Encounter (Signed)
Spoke with pt and she knows to increase the meds as noted below tomorrow. New scripts sent to pharmacy and pt knows to repeat labs in 2 weeks, order in epic.

## 2018-04-27 ENCOUNTER — Ambulatory Visit (INDEPENDENT_AMBULATORY_CARE_PROVIDER_SITE_OTHER): Payer: Medicare Other | Admitting: Internal Medicine

## 2018-04-27 ENCOUNTER — Encounter: Payer: Self-pay | Admitting: Internal Medicine

## 2018-04-27 VITALS — BP 124/64 | HR 72 | Ht 64.25 in | Wt 195.0 lb

## 2018-04-27 DIAGNOSIS — K7460 Unspecified cirrhosis of liver: Secondary | ICD-10-CM | POA: Diagnosis not present

## 2018-04-27 DIAGNOSIS — K7581 Nonalcoholic steatohepatitis (NASH): Secondary | ICD-10-CM | POA: Diagnosis not present

## 2018-04-27 DIAGNOSIS — R932 Abnormal findings on diagnostic imaging of liver and biliary tract: Secondary | ICD-10-CM

## 2018-04-27 DIAGNOSIS — R188 Other ascites: Secondary | ICD-10-CM | POA: Diagnosis not present

## 2018-04-27 NOTE — Progress Notes (Signed)
HISTORY OF PRESENT ILLNESS:  Crystal Barnett is a 77 y.o. 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  She is also followed by hematology/oncology for thrombocytopenia and an indeterminate liver lesion which they are monitoring radiographically.  She has completed Twinrix vaccination series.  She was last seen in this office March 10, 2018.  See that dictation.  At that time she was initiated on low-dose Lasix 20 mg and Aldactone 50 mg daily.  Zestoretic was stopped.  Despite this he had significant recurrent symptomatic ascites and underwent 8 L paracentesis last week.  No evidence for SBP.  3 days later her diuretics were increased to Lasix 40 mg daily and Aldactone 100 mg daily.  Blood work 1 week ago was unremarkable.  She is due for repeat blood work next week.  Patient tells me that she feels much better after her paracentesis.  No other particular complaints.  No evidence for GI bleeding.  Last upper endoscopy September 2019 with portal hypertensive gastropathy but no varices.  Last colonoscopy elsewhere 2008.  She has aged out.  From her office visit 6 weeks ago her weight is down 23 pounds.  REVIEW OF SYSTEMS:  All non-GI ROS negative unless otherwise stated in the HPI except for fatigue  Past Medical History:  Diagnosis Date  . ALLERGIC RHINITIS 01/12/2008  . Allergy   . Cataract    bilateral  . Colon polyps    hyperplastic  . Diabetes ([place])   . Fatty liver   . GOITER, MULTINODULAR 01/12/2008  . Hepatic cirrhosis ([place])   . Hyperlipidemia   . Hypertension   . HYPOTHYROIDISM 09/07/2008  . 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 Osteoporosis   . Splenomegaly   . Thrombocytopenia ([place])    Appears chronic from at least 2014  . Vitamin D deficiency     Past Surgical History:  Procedure Laterality Date  . arthroscopic left knee  2005  .  BREAST BIOPSY Left 2017  . BREAST EXCISIONAL BIOPSY Right 2008  . BREAST SURGERY  2008   Breast Biopsy   . CHOLECYSTECTOMY  1985  . COLONOSCOPY    . IR GENERIC HISTORICAL  04/02/2016   IR RADIOLOGIST EVAL & MGMT 04/02/2016 Jacqulynn Cadet, MD [facility] INTERV RAD  . IR PARACENTESIS  02/02/2018  . IR PARACENTESIS  04/20/2018  . IR RADIOLOGIST EVAL & MGMT  10/29/2016  . IR RADIOLOGIST EVAL & MGMT  04/29/2017  . IR RADIOLOGIST EVAL & MGMT  11/04/2017  . IR RADIOLOGIST EVAL & MGMT  04/16/2018  . POLYPECTOMY    . UPPER GASTROINTESTINAL ENDOSCOPY      Social History Crystal Barnett  reports that she quit smoking about 27 years ago. She has never used smokeless tobacco. She reports that she does not drink alcohol or use drugs.  family history includes Breast cancer in her mother; Goiter in her maternal grandmother; Stroke (age of onset: 73) in her father.  No Known Allergies     PHYSICAL EXAMINATION: Vital signs: BP 124/64   Pulse 72   Ht 5' 4.25" (1.632 m)   Wt 195 lb (88.5 kg)   BMI 33.21 kg/m   Constitutional: generally well-appearing, no acute distress Psychiatric: alert and oriented x3, cooperative Eyes: extraocular movements intact, anicteric, conjunctiva pink Mouth: oral pharynx moist, no lesions Neck: supple no lymphadenopathy Cardiovascular:  heart regular rate and rhythm, no murmur Lungs: clear to auscultation bilaterally Abdomen: soft, obese, nontender, nondistended, no obvious ascites, no peritoneal signs, normal bowel sounds, no organomegaly Rectal: Omitted Extremities: no clubbing or cyanosis.  Trace lower extremity edema bilaterally Skin: no lesions on visible extremities Neuro: No focal deficits. No asterixis.   ASSESSMENT:  1.  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  Clinically stable status post recent large-volume paracentesis 2.  Portal hypertensive gastropathy without varices on endoscopy 3.  Multiple medical problems   PLAN:  1.  Continue  Lasix 40 mg daily 2.  Continue Aldactone 100 mg daily 3.  2 g sodium diet 4.  Repeat basic metabolic panel next week and thereafter as directed 5.  Office follow-up 3 months.  Sooner if needed  25-minute spent face-to-face with the patient.  Greater than 50% of the time used for counseling regarding her advanced liver disease.  As well answering multiple questions

## 2018-04-27 NOTE — Patient Instructions (Signed)
Please follow up in 3 months.  

## 2018-05-07 ENCOUNTER — Other Ambulatory Visit (INDEPENDENT_AMBULATORY_CARE_PROVIDER_SITE_OTHER): Payer: Medicare Other

## 2018-05-07 DIAGNOSIS — R188 Other ascites: Secondary | ICD-10-CM | POA: Diagnosis not present

## 2018-05-07 LAB — BASIC METABOLIC PANEL
BUN: 15 mg/dL (ref 6–23)
CO2: 30 mEq/L (ref 19–32)
Calcium: 9.1 mg/dL (ref 8.4–10.5)
Chloride: 98 mEq/L (ref 96–112)
Creatinine, Ser: 0.71 mg/dL (ref 0.40–1.20)
GFR: 84.59 mL/min (ref >60.00)
Glucose, Bld: 112 mg/dL — ABNORMAL HIGH (ref 70–99)
Potassium: 4.2 mEq/L (ref 3.5–5.1)
Sodium: 134 mEq/L — ABNORMAL LOW (ref 135–145)

## 2018-05-11 ENCOUNTER — Other Ambulatory Visit: Payer: Self-pay

## 2018-05-11 DIAGNOSIS — R188 Other ascites: Principal | ICD-10-CM

## 2018-05-11 DIAGNOSIS — K7460 Unspecified cirrhosis of liver: Secondary | ICD-10-CM

## 2018-06-01 NOTE — Progress Notes (Signed)
[facility]  HEMATOLOGY ONCOLOGY PROGRESS NOTE  Date of service:  06/02/18   Patient Care Team: Leighton Ruff, MD as PCP - General (Family Medicine)  Diagnosis:  #1 Thrombocytopenia - ?related to NASH with liver cirrhosis/hypersplenism #2 History of fatty liver now with concern for liver cirrhosis from NASH and concerning liver lesion -likely dysplastic nodule vs early [facility] #3 Liver nodule ?dysplatic nodule vs HCC (will be following up with Dr. Laurence Ferrari)  Current Treatment: observation and further evaluation  INTERVAL HISTORY:   Crystal Barnett is here for follow-up regarding her thrombocytopenia and regarding her concerning liver lesion - likely low grade HCC. The patient's last visit with [place] was on 12/02/17. The pt reports that she is doing well overall.   In the interim, the pt had 8 liters removed via IR Paracentesis on 04/20/18. She also had an MRI abdomen on 04/16/18, as noted below. The pt notes that her lesion has not been ablated in the interim. She also notes that 4 liters were removed 2 months prior to the 04/20/18 removal. Her dose of Lasix was doubled with DR. Scarlette Shorts in GI, and she is also taking Aldactone. The pt had an endoscopy in September which was notable for portal hypertensive gastropathy. She denies any blood in the stools or black stools. She is abiding by her diabetic and low sodium diets. The pt notes that she is intending to pursue a tace procedure in March 2020 with GI. She denies abdominal pains at this time.   The pt reports that she continues to have good energy levels and is eating well. She has lost weight through diet modification and fluid removal.   Of note since the patient's last visit, pt has had an MRI Abdomen completed on 04/16/18 with results revealing There has been mild increase in size of hypervascular liver lesion within posterior dome. Enhancement characteristics are compatible with LR-5 (definitely [place]). No new lesions identified. 2. Morphologic  features of liver compatible with cirrhosis with stigmata of portal venous hypertension.  Lab results today (06/02/18) of CBC w/diff, Reticulocytes is as follows: all values are WNL except for PLT at 117k. 06/02/18 CMP is pending 06/02/18 AFP Tumor marker is pending   On review of systems, pt reports expected weight loss, good energy levels, some abdominal fullness, eating well, staying active, and denies blood in the stools, black stools, abdominal pains, leg swelling, and any other symptoms.    REVIEW OF SYSTEMS:    A 10+ POINT REVIEW OF SYSTEMS WAS OBTAINED including neurology, dermatology, psychiatry, cardiac, respiratory, lymph, extremities, GI, GU, Musculoskeletal, constitutional, breasts, reproductive, HEENT.  All pertinent positives are noted in the HPI.  All others are negative.  . Past Medical History:  Diagnosis Date  . ALLERGIC RHINITIS 01/12/2008  . Allergy   . Cataract    bilateral  . Colon polyps    hyperplastic  . Diabetes ([place])   . Fatty liver   . GOITER, MULTINODULAR 01/12/2008  . Hepatic cirrhosis ([place])   . Hyperlipidemia   . Hypertension   . HYPOTHYROIDISM 09/07/2008  . 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 Osteoporosis   . Splenomegaly   . Thrombocytopenia ([place])    Appears chronic from at least 2014  . Vitamin D deficiency     . Past Surgical History:  Procedure Laterality Date  . arthroscopic left knee  2005  . BREAST BIOPSY Left 2017  . BREAST EXCISIONAL  BIOPSY Right 2008  . BREAST SURGERY  2008   Breast Biopsy   . CHOLECYSTECTOMY  1985  . COLONOSCOPY    . IR GENERIC HISTORICAL  04/02/2016   IR RADIOLOGIST EVAL & MGMT 04/02/2016 Jacqulynn Cadet, MD [facility] INTERV RAD  . IR PARACENTESIS  02/02/2018  . IR PARACENTESIS  04/20/2018  . IR RADIOLOGIST EVAL & MGMT  10/29/2016  . IR RADIOLOGIST EVAL & MGMT  04/29/2017  . IR RADIOLOGIST EVAL & MGMT  11/04/2017  . IR RADIOLOGIST EVAL & MGMT  04/16/2018  . POLYPECTOMY      . UPPER GASTROINTESTINAL ENDOSCOPY      . Social History   Tobacco Use  . Smoking status: Former Smoker    Last attempt to quit: 05/13/1990    Years since quitting: 28.0  . Smokeless tobacco: Never Used  Substance Use Topics  . Alcohol use: No    Alcohol/week: 0.0 standard drinks  . Drug use: No    ALLERGIES:  has No Known Allergies.  MEDICATIONS:  Current Outpatient Medications  Medication Sig Dispense Refill  . alendronate (FOSAMAX) 70 MG tablet Take 70 mg by mouth once a week. On Saturday    . Blood Glucose Monitoring Suppl (ONETOUCH VERIO FLEX SYSTEM) w/Device KIT OneTouch Verio System  USE TO TEST ONCE D UTD    . Cholecalciferol (VITAMIN D) 50 MCG (2000 UT) CAPS Take 2,000 Units by mouth at bedtime.    . fluticasone (FLONASE) 50 MCG/ACT nasal spray Place 1 spray into both nostrils daily.     . furosemide (LASIX) 40 MG tablet Take 1 tablet (40 mg total) by mouth daily. 30 tablet 3  . Lactobacillus (PROBIOTIC ACIDOPHILUS) CAPS Take 1 capsule by mouth 2 (two) times a week.     . levothyroxine (SYNTHROID, LEVOTHROID) 50 MCG tablet TAKE 1 TABLET EVERY DAY (Patient taking differently: Take 50 mcg by mouth daily. ) 90 tablet 3  . metFORMIN (GLUCOPHAGE) 500 MG tablet Take 500 mg by mouth every evening. Take 1 tablet by mouth once daily     . montelukast (SINGULAIR) 10 MG tablet Take 10 mg by mouth at bedtime.     . Multiple Vitamin (MULTIVITAMIN) tablet Take 1 tablet by mouth at bedtime.     Marland Kitchen spironolactone (ALDACTONE) 100 MG tablet Take 1 tablet (100 mg total) by mouth daily. 30 tablet 3   No current facility-administered medications for this visit.     PHYSICAL EXAMINATION: ECOG PERFORMANCE STATUS: 1 - Symptomatic but completely ambulatory  Vitals:   06/02/18 1023  BP: (!) 132/59  Pulse: 63  Resp: 18  Temp: 97.7 F (36.5 C)  SpO2: 99%   Filed Weights   06/02/18 1023  Weight: 191 lb 3.2 oz (86.7 kg)   .Body mass index is 32.56 kg/m.  GENERAL:alert, in no acute  distress and comfortable SKIN: no acute rashes, no significant lesions EYES: conjunctiva are pink and non-injected, sclera anicteric OROPHARYNX: MMM, no exudates, no oropharyngeal erythema or ulceration NECK: supple, no JVD LYMPH:  no palpable lymphadenopathy in the cervical, axillary or inguinal regions LUNGS: clear to auscultation b/l with normal respiratory effort HEART: regular rate & rhythm ABDOMEN:  normoactive bowel sounds , non tender, not distended. No palpable hepatosplenomegaly.  Extremity: no pedal edema PSYCH: alert & oriented x 3 with fluent speech NEURO: no focal motor/sensory deficits   LABORATORY DATA:   I have reviewed the data as listed  . CBC Latest Ref Rng & Units 06/02/2018 04/14/2018 12/02/2017  WBC 4.0 -  10.5 K/uL 4.2 4.2 4.5  Hemoglobin 12.0 - 15.0 g/dL 13.5 12.5 13.2  Hematocrit 36.0 - 46.0 % 40.8 38.5 39.3  Platelets 150 - 400 K/uL 117(L) 135(L) 124(L)    . CMP Latest Ref Rng & Units 06/05/2018 06/02/2018 05/07/2018  Glucose 70 - 99 mg/dL 133(H) 99 112(H)  BUN 6 - 23 mg/dL _0 Creatinine 0.40 - 1.20 mg/dL 0.70 0.76 0.71  Sodium 135 - 145 mEq/L 135 136 134(L)  Potassium 3.5 - 5.1 mEq/L 4.4 4.6 4.2  Chloride 96 - 112 mEq/L 98 101 98  CO2 19 - 32 mEq/L _1 Calcium 8.4 - 10.5 mg/dL 9.5 9.1 9.1  Total Protein 6.5 - 8.1 g/dL - 7.0 -  Total Bilirubin 0.3 - 1.2 mg/dL - 0.7 -  Alkaline Phos 38 - 126 U/L - 80 -  AST 15 - 41 U/L - 34 -  ALT 0 - 44 U/L - 27 -     RADIOLOGY  MRI Abdomen W WO Contrast 10/28/17  IMPRESSION: 1.2 cm hypervascular mass in the dome of the right hepatic lobe shows no significant change, however Imaging findings are diagnostic of hepatocellular carcinoma in the setting of cirrhosis. No evidence of abdominal metastatic disease. Hepatic cirrhosis, and mild splenomegaly consistent with portal venous hypertension. Moderate ascites, increased since prior study.  MRI abd w and wo contrast 04/23/17 stable appearance of  segment 7 liver lesion. Highly suspicious for hepatocellular carcinoma. Cirrhosis, mild hepatic steatosis and splenomegaly.   MRI abd w and wo contrast 10/22/2016 CLINICAL DATA:  Followup of liver lesions. Nonalcoholic steatohepatitis induced cirrhosis. Thrombocytopenia. EXAM: MRI ABDOMEN WITHOUT AND WITH CONTRAST TECHNIQUE: Multiplanar multisequence MR imaging of the abdomen was performed both before and after the administration of intravenous contrast. CONTRAST:  13m EOVIST GADOXETATE DISODIUM 0.25 MOL/L IV SOLN COMPARISON:  MRI of 02/27/2016.  Clinic note of 04/02/2016. FINDINGS: Motion degradation involves the arterial phase post-contrast images primarily. Lower chest: Normal heart size without pericardial or pleural effusion. Hepatobiliary: Moderate to marked cirrhosis. Segment 7 liver lesion has undergone mild interval enlargement. For example, on T2 weighted imaging, measures 12 x 10 mm (image 13/series 7) versus 10 x 9 mm on the prior. On post-contrast image 27/ series 502, measures 17 x 13 mm today versus 14 x 10 mm on the prior (when remeasured). Demonstrates arterial and portal venous phase hyper enhancement with relative isointensity on more delayed post-contrast imaging. Left hepatic lobe tiny lesion is likely a cyst and is similar. Mild hepatic steatosis. Cholecystectomy, without biliary ductal dilatation. Pancreas:  Normal, without mass or ductal dilatation. Spleen:  Mild splenomegaly, 14.3 cm craniocaudal. Adrenals/Urinary Tract: Normal adrenal glands. Normal kidneys, without hydronephrosis. Stomach/Bowel: Normal stomach and abdominal bowel loops. Vascular/Lymphatic: Patent hepatic and portal veins. No specific evidence of portal venous hypertension. Circumaortic left renal vein. No retroperitoneal or retrocrural adenopathy. Other:  Small volume perihepatic ascites is unchanged Musculoskeletal: No acute osseous abnormality. IMPRESSION: 1. Mild interval  enlargement of a segment 7 liver lesion, highly suspicious for hepatocellular carcinoma. 2. Cirrhosis, mild hepatic steatosis, and mild splenomegaly. Electronically Signed   By: KAbigail MiyamotoM.D.   On: 10/22/2016 10:22    ASSESSMENT & PLAN:   78y.o. female with  #1 Chronic thrombocytopenia. Platelet counts were previously relatively stable at 87k (were 79k -6 months ago). Increased to 124k at last visit.  Likely related to cirrhosis related to NASH with mild splenomegaly.  B12, TSH, LDH within normal limits. MRI abd shows mild  splenomegaly at 14.5 cm  #2 liver cirrhosis likely related to NASH hepatitis profile neg, ferritin unrevealing. Recent EGD showed no evidence of varices. Subtle changes of portal hypertensive gastropathy.  #3 Lesion in the RIGHT hepatic lobe has imaging characteristics highly suspicious for hepatic cellular carcinoma including arterial enhancement and a new peripheral rim.  -MRI results from 04/23/17 do not indicate a significant change in the liver nodule   -Rpt MRI from 10/28/17 shows overall stable liver lesion at 1.2cm, mild splenomegaly, moderate ascites, liver cirrhosis.   Plan  -Discussed pt labwork today, 06/02/18; blood counts are stable -06/02/18 AFP tumor marker is 3.9. Last available AFP from 04/14/18 was at 3.7 -Discussed the 04/16/18 MRI Abdomen which revealed There has been mild increase in size of hypervascular liver lesion within posterior dome. 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 No new lesions identified. 2. Morphologic features of liver compatible with cirrhosis with stigmata of portal venous hypertension. -Continue follow up with Dr. Scarlette Shorts in GI and IR for ongoing management, evaluation and treatment of [place] and liver cirrhosis -If the platelets are less than 30k or if they're less than 50k  and with bleeding issues -would consider the use of Nplate. -She continues to follow-up with Laurence Ferrari (IR)- to  determine need for ablation or possible radiation of the liver lesion. He did not deem necessary at this time. -Will see the pt back in 6 months, sooner if any new concerns    RTC with dr Irene Limbo with labs in 6 months   The total time spent in the appt was 25 minutes and more than 50% was on counseling and direct patient cares.     Sullivan Lone MD [place] AAHIVMS [facility] Hematology/Oncology Physician [facility]  (Office):       [phone] (Work cell):  [phone] (Fax):           [phone]  I, Baldwin [place], am acting as a scribe for Dr. Sullivan Lone.   .I have reviewed the above documentation for accuracy and completeness, and I agree with the above. Brunetta Genera MD\

## 2018-06-02 ENCOUNTER — Telehealth: Payer: Self-pay | Admitting: Hematology

## 2018-06-02 ENCOUNTER — Inpatient Hospital Stay: Payer: Medicare Other | Attending: Hematology

## 2018-06-02 ENCOUNTER — Inpatient Hospital Stay (HOSPITAL_BASED_OUTPATIENT_CLINIC_OR_DEPARTMENT_OTHER): Payer: Medicare Other | Admitting: Hematology

## 2018-06-02 VITALS — BP 132/59 | HR 63 | Temp 97.7°F | Resp 18 | Ht 64.25 in | Wt 191.2 lb

## 2018-06-02 DIAGNOSIS — Z79899 Other long term (current) drug therapy: Secondary | ICD-10-CM | POA: Diagnosis not present

## 2018-06-02 DIAGNOSIS — Z7984 Long term (current) use of oral hypoglycemic drugs: Secondary | ICD-10-CM

## 2018-06-02 DIAGNOSIS — E785 Hyperlipidemia, unspecified: Secondary | ICD-10-CM | POA: Insufficient documentation

## 2018-06-02 DIAGNOSIS — K769 Liver disease, unspecified: Secondary | ICD-10-CM | POA: Insufficient documentation

## 2018-06-02 DIAGNOSIS — E119 Type 2 diabetes mellitus without complications: Secondary | ICD-10-CM | POA: Diagnosis not present

## 2018-06-02 DIAGNOSIS — K766 Portal hypertension: Secondary | ICD-10-CM | POA: Insufficient documentation

## 2018-06-02 DIAGNOSIS — R162 Hepatomegaly with splenomegaly, not elsewhere classified: Secondary | ICD-10-CM

## 2018-06-02 DIAGNOSIS — K7460 Unspecified cirrhosis of liver: Secondary | ICD-10-CM

## 2018-06-02 DIAGNOSIS — R188 Other ascites: Secondary | ICD-10-CM | POA: Insufficient documentation

## 2018-06-02 DIAGNOSIS — C220 Liver cell carcinoma: Secondary | ICD-10-CM

## 2018-06-02 DIAGNOSIS — Z87891 Personal history of nicotine dependence: Secondary | ICD-10-CM | POA: Insufficient documentation

## 2018-06-02 DIAGNOSIS — L439 Lichen planus, unspecified: Secondary | ICD-10-CM

## 2018-06-02 DIAGNOSIS — K7581 Nonalcoholic steatohepatitis (NASH): Secondary | ICD-10-CM | POA: Insufficient documentation

## 2018-06-02 DIAGNOSIS — M810 Age-related osteoporosis without current pathological fracture: Secondary | ICD-10-CM | POA: Insufficient documentation

## 2018-06-02 DIAGNOSIS — E039 Hypothyroidism, unspecified: Secondary | ICD-10-CM | POA: Insufficient documentation

## 2018-06-02 DIAGNOSIS — D696 Thrombocytopenia, unspecified: Secondary | ICD-10-CM | POA: Insufficient documentation

## 2018-06-02 DIAGNOSIS — E559 Vitamin D deficiency, unspecified: Secondary | ICD-10-CM

## 2018-06-02 DIAGNOSIS — I10 Essential (primary) hypertension: Secondary | ICD-10-CM | POA: Insufficient documentation

## 2018-06-02 LAB — CBC WITH DIFFERENTIAL/PLATELET
Abs Immature Granulocytes: 0.01 10*3/uL (ref 0.00–0.07)
BASOS ABS: 0 10*3/uL (ref 0.0–0.1)
Basophils Relative: 1 %
EOS ABS: 0.1 10*3/uL (ref 0.0–0.5)
Eosinophils Relative: 2 %
HCT: 40.8 % (ref 36.0–46.0)
Hemoglobin: 13.5 g/dL (ref 12.0–15.0)
Immature Granulocytes: 0 %
Lymphocytes Relative: 20 %
Lymphs Abs: 0.9 10*3/uL (ref 0.7–4.0)
MCH: 28.2 pg (ref 26.0–34.0)
MCHC: 33.1 g/dL (ref 30.0–36.0)
MCV: 85.4 fL (ref 80.0–100.0)
Monocytes Absolute: 0.4 10*3/uL (ref 0.1–1.0)
Monocytes Relative: 9 %
Neutro Abs: 2.9 10*3/uL (ref 1.7–7.7)
Neutrophils Relative %: 68 %
Platelets: 117 10*3/uL — ABNORMAL LOW (ref 150–400)
RBC: 4.78 MIL/uL (ref 3.87–5.11)
RDW: 14.3 % (ref 11.5–15.5)
WBC: 4.2 10*3/uL (ref 4.0–10.5)
nRBC: 0 % (ref 0.0–0.2)

## 2018-06-02 LAB — CMP (CANCER CENTER ONLY)
ALT: 27 U/L (ref 0–44)
AST: 34 U/L (ref 15–41)
Albumin: 3.8 g/dL (ref 3.5–5.0)
Alkaline Phosphatase: 80 U/L (ref 38–126)
Anion gap: 10 (ref 5–15)
BUN: 12 mg/dL (ref 8–23)
CO2: 25 mmol/L (ref 22–32)
Calcium: 9.1 mg/dL (ref 8.9–10.3)
Chloride: 101 mmol/L (ref 98–111)
Creatinine: 0.76 mg/dL (ref 0.44–1.00)
GFR, Est AFR Am: >60 mL/min (ref >60)
GFR, Estimated: >60 mL/min (ref >60)
Glucose, Bld: 99 mg/dL (ref 70–99)
Potassium: 4.6 mmol/L (ref 3.5–5.1)
Sodium: 136 mmol/L (ref 135–145)
TOTAL PROTEIN: 7 g/dL (ref 6.5–8.1)
Total Bilirubin: 0.7 mg/dL (ref 0.3–1.2)

## 2018-06-02 LAB — RETICULOCYTES
Immature Retic Fract: 6.9 % (ref 2.3–15.9)
RBC.: 4.78 MIL/uL (ref 3.87–5.11)
Retic Count, Absolute: 73.6 10*3/uL (ref 19.0–186.0)
Retic Ct Pct: 1.5 % (ref 0.4–3.1)

## 2018-06-02 NOTE — Patient Instructions (Signed)
Thank you for choosing [facility] to provide your oncology and hematology care.  To afford each patient quality time with our providers, please arrive 30 minutes before your scheduled appointment time.  If you arrive late for your appointment, you may be asked to reschedule.  We strive to give you quality time with our providers, and arriving late affects you and other patients whose appointments are after yours.    If you are a no show for multiple scheduled visits, you may be dismissed from the clinic at the providers discretion.     Again, thank you for choosing [facility], our hope is that these requests will decrease the amount of time that you wait before being seen by our physicians.  ______________________________________________________________________   Should you have questions after your visit to the [facility], please contact our office at [phone] between the hours of 8:30 and 4:30 p.m.    Voicemails left after 4:30p.m will not be returned until the following business day.     For prescription refill requests, please have your pharmacy contact us directly.  Please also try to allow 48 hours for prescription requests.     Please contact the scheduling department for questions regarding scheduling.  For scheduling of procedures such as PET scans, CT scans, MRI, Ultrasound, etc please contact central scheduling at [phone].     Resources For Cancer Patients and Caregivers:    Oncolink.org:  A wonderful resource for patients and healthcare providers for information regarding your disease, ways to tract your treatment, what to expect, etc.      American Cancer Society:  800-227-2345  Can help patients locate various types of support and financial assistance   Cancer Care: 1-800-813-HOPE (4673) Provides financial assistance, online support groups, medication/co-pay assistance.     [place] DSS:  336-641-3447 Where to apply  for food stamps, Medicaid, and utility assistance   Medicare Rights Center: 800-333-4114 Helps people with Medicare understand their rights and benefits, navigate the Medicare system, and secure the quality healthcare they deserve   SCAT: 336-333-6589 [place] [employer]'s shared-ride transportation service for eligible riders who have a disability that prevents them from riding the fixed route bus.     For additional information on assistance programs please contact our social worker:   Abigail Elmore:  [phone]  

## 2018-06-02 NOTE — Telephone Encounter (Signed)
Scheduled appts per 01/21 los.  Printed calendar and after visit summary per patient request.

## 2018-06-03 LAB — AFP TUMOR MARKER: AFP, Serum, Tumor Marker: 3.9 ng/mL (ref 0.0–8.3)

## 2018-06-04 ENCOUNTER — Other Ambulatory Visit: Payer: Self-pay

## 2018-06-04 DIAGNOSIS — K7460 Unspecified cirrhosis of liver: Secondary | ICD-10-CM

## 2018-06-05 ENCOUNTER — Other Ambulatory Visit (INDEPENDENT_AMBULATORY_CARE_PROVIDER_SITE_OTHER): Payer: Medicare Other

## 2018-06-05 ENCOUNTER — Other Ambulatory Visit: Payer: Self-pay

## 2018-06-05 DIAGNOSIS — R188 Other ascites: Principal | ICD-10-CM

## 2018-06-05 DIAGNOSIS — K7460 Unspecified cirrhosis of liver: Secondary | ICD-10-CM

## 2018-06-05 LAB — BASIC METABOLIC PANEL
BUN: 17 mg/dL (ref 6–23)
CALCIUM: 9.5 mg/dL (ref 8.4–10.5)
CO2: 29 meq/L (ref 19–32)
Chloride: 98 mEq/L (ref 96–112)
Creatinine, Ser: 0.7 mg/dL (ref 0.40–1.20)
GFR: 80.88 mL/min (ref >60.00)
Glucose, Bld: 133 mg/dL — ABNORMAL HIGH (ref 70–99)
Potassium: 4.4 mEq/L (ref 3.5–5.1)
Sodium: 135 mEq/L (ref 135–145)

## 2018-06-21 ENCOUNTER — Other Ambulatory Visit: Payer: Self-pay | Admitting: Internal Medicine

## 2018-07-01 ENCOUNTER — Other Ambulatory Visit: Payer: Self-pay | Admitting: Radiology

## 2018-07-01 ENCOUNTER — Other Ambulatory Visit: Payer: Self-pay | Admitting: Interventional Radiology

## 2018-07-01 DIAGNOSIS — K7460 Unspecified cirrhosis of liver: Secondary | ICD-10-CM

## 2018-07-01 DIAGNOSIS — R160 Hepatomegaly, not elsewhere classified: Secondary | ICD-10-CM

## 2018-07-01 DIAGNOSIS — K7581 Nonalcoholic steatohepatitis (NASH): Principal | ICD-10-CM

## 2018-07-09 ENCOUNTER — Ambulatory Visit: Payer: Medicare Other | Admitting: Endocrinology

## 2018-07-23 ENCOUNTER — Other Ambulatory Visit (HOSPITAL_COMMUNITY)
Admission: RE | Admit: 2018-07-23 | Discharge: 2018-07-23 | Disposition: A | Discharge status: Home / Self Care | Payer: Medicare Other | Source: Other Acute Inpatient Hospital | Attending: Interventional Radiology | Admitting: Interventional Radiology

## 2018-07-23 DIAGNOSIS — K7460 Unspecified cirrhosis of liver: Secondary | ICD-10-CM | POA: Diagnosis not present

## 2018-07-23 DIAGNOSIS — K7581 Nonalcoholic steatohepatitis (NASH): Secondary | ICD-10-CM | POA: Diagnosis not present

## 2018-07-23 LAB — CBC
HCT: 44.7 % (ref 36.0–46.0)
Hemoglobin: 14.2 g/dL (ref 12.0–15.0)
MCH: 28.6 pg (ref 26.0–34.0)
MCHC: 31.8 g/dL (ref 30.0–36.0)
MCV: 89.9 fL (ref 80.0–100.0)
PLATELETS: 123 10*3/uL — AB (ref 150–400)
RBC: 4.97 MIL/uL (ref 3.87–5.11)
RDW: 14.7 % (ref 11.5–15.5)
WBC: 4.2 10*3/uL (ref 4.0–10.5)
nRBC: 0 % (ref 0.0–0.2)

## 2018-07-23 LAB — COMPREHENSIVE METABOLIC PANEL
ALT: 20 U/L (ref 0–44)
AST: 30 U/L (ref 15–41)
Albumin: 4 g/dL (ref 3.5–5.0)
Alkaline Phosphatase: 72 U/L (ref 38–126)
Anion gap: 10 (ref 5–15)
BUN: 15 mg/dL (ref 8–23)
CHLORIDE: 104 mmol/L (ref 98–111)
CO2: 24 mmol/L (ref 22–32)
Calcium: 9.1 mg/dL (ref 8.9–10.3)
Creatinine, Ser: 0.67 mg/dL (ref 0.44–1.00)
GFR calc non Af Amer: >60 mL/min (ref >60)
Glucose, Bld: 97 mg/dL (ref 70–99)
Potassium: 4.2 mmol/L (ref 3.5–5.1)
Sodium: 138 mmol/L (ref 135–145)
Total Bilirubin: 0.9 mg/dL (ref 0.3–1.2)
Total Protein: 7.1 g/dL (ref 6.5–8.1)

## 2018-07-23 LAB — PROTIME-INR
INR: 1 (ref 0.8–1.2)
Prothrombin Time: 13.3 seconds (ref 11.4–15.2)

## 2018-07-24 ENCOUNTER — Other Ambulatory Visit: Payer: Self-pay

## 2018-07-24 ENCOUNTER — Ambulatory Visit (HOSPITAL_COMMUNITY)
Admission: RE | Admit: 2018-07-24 | Discharge: 2018-07-24 | Disposition: A | Discharge status: Home / Self Care | Payer: Medicare Other | Source: Ambulatory Visit | Attending: Interventional Radiology | Admitting: Interventional Radiology

## 2018-07-24 DIAGNOSIS — K7460 Unspecified cirrhosis of liver: Secondary | ICD-10-CM | POA: Insufficient documentation

## 2018-07-24 DIAGNOSIS — K7581 Nonalcoholic steatohepatitis (NASH): Secondary | ICD-10-CM | POA: Insufficient documentation

## 2018-07-24 LAB — AFP TUMOR MARKER: AFP, Serum, Tumor Marker: 5.2 ng/mL (ref 0.0–8.3)

## 2018-07-24 MED ORDER — SODIUM CHLORIDE (PF) 0.9 % IJ SOLN
INTRAMUSCULAR | Status: AC
  Filled 2018-07-24: qty 50

## 2018-07-24 MED ORDER — IOHEXOL 350 MG/ML SOLN
100.0000 mL | Freq: Once | INTRAVENOUS | Status: AC | PRN
  Administered 2018-07-24: 100 mL via INTRAVENOUS

## 2018-07-28 ENCOUNTER — Other Ambulatory Visit: Payer: Medicare Other

## 2018-07-28 ENCOUNTER — Telehealth: Payer: Self-pay | Admitting: Interventional Radiology

## 2018-07-28 DIAGNOSIS — C220 Liver cell carcinoma: Secondary | ICD-10-CM | POA: Diagnosis not present

## 2018-07-28 NOTE — Telephone Encounter (Signed)
Chief Complaint: Patient was evaluated by telephone today for  Chief Complaint  Patient presents with   Hepatic Cancer    Referring Physician(s): Dr. Irene Limbo  History of Present Illness: Crystal Barnett is a 78 y.o. 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 MRI imaging was performed first in December 2016 to evaluate a region of possible concern in the left hepatic lobe. On that study, there was a small 9 mm enhancing focus in hepatic segment 7 in the posterosuperior aspect of the hepatic dome. This subsequently been followed at 6 month intervals with repeat MRI scans of the abdomen. On the scan which was performed on 02/27/2016, the lesion measures 10 mm compared to 9 mm nearly one year previously. However, the lesion does appear slightly more prominent and demonstrates enhancement characteristics which are concerning for hepatocellular carcinoma.Imaging on 10/22/2016, this area of concern showed mild interval enlargementto 1.7 cm. We elected to continue observation despite the slow growth and her most recent MR imaging from 04/23/17 demonstrates stabilityat 1.6 cm.MRI dated 10/28/2017 demonstrates continued stability of the lesion. In fact, on today's examination it measures approximately 1.2 cm. The slight decrease in size may represent differences in measurement technique rather than true involution.  MRI from 04/16/18 demonstrates more definitive enlargement of the lesion now measuring up to 1.9 cm.  Additionally, there appears to be an enhancing pseudocapsule.  On the present study, the imaging characteristics are consistent with a lie RADS 5 lesion which is diagnostic of hepatocellular carcinoma.  The very slow growth rate over the past 3 years suggests a low-grade or indolent malignancy at this time.  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   Osteoporosis    Splenomegaly    Thrombocytopenia ([place])    Appears chronic from at least 2014   Vitamin D deficiency     Past Surgical History:  Procedure Laterality Date   arthroscopic left knee  2005   BREAST BIOPSY Left 2017   BREAST EXCISIONAL BIOPSY Right 2008   BREAST SURGERY  2008   Breast Biopsy    CHOLECYSTECTOMY  1985   COLONOSCOPY     IR GENERIC HISTORICAL  04/02/2016   IR RADIOLOGIST EVAL & MGMT 04/02/2016 Jacqulynn Cadet, MD [facility] INTERV RAD   IR PARACENTESIS  02/02/2018   IR PARACENTESIS  04/20/2018   IR RADIOLOGIST EVAL & MGMT  10/29/2016   IR RADIOLOGIST EVAL & MGMT  04/29/2017   IR RADIOLOGIST EVAL & MGMT  11/04/2017   IR RADIOLOGIST EVAL & MGMT  04/16/2018   POLYPECTOMY     UPPER GASTROINTESTINAL ENDOSCOPY      Allergies: Patient has no known allergies.  Medications: Prior to Admission medications   Medication Sig Start Date End Date Taking? Authorizing Provider  alendronate (FOSAMAX) 70 MG tablet Take 70 mg by mouth once a week. On Saturday 03/24/17   [provider]  Blood Glucose Monitoring Suppl ([place]) w/Device KIT OneTouch Verio System  USE TO TEST ONCE D UTD  [provider]  Cholecalciferol (VITAMIN D) 50 MCG (2000 UT) CAPS Take 2,000 Units by mouth at bedtime.    [provider]  fluticasone (FLONASE) 50 MCG/ACT nasal spray Place 1 spray into both nostrils daily.     [provider]  furosemide (LASIX) 40 MG tablet TAKE 1 TABLET (40 MG TOTAL) BY MOUTH DAILY. 06/22/18   Irene Shipper, MD  Lactobacillus (PROBIOTIC ACIDOPHILUS) CAPS Take 1 capsule by mouth 2 (two) times a week.      [provider]  levothyroxine (SYNTHROID, LEVOTHROID) 50 MCG tablet TAKE 1 TABLET EVERY DAY Patient taking differently: Take 50 mcg by mouth daily.  11/06/17   Renato Shin, MD  metFORMIN (GLUCOPHAGE) 500 MG tablet Take 500 mg by mouth every evening. Take 1 tablet by mouth once daily     [provider]  montelukast (SINGULAIR) 10 MG tablet Take 10 mg by mouth at bedtime.  03/25/14   [provider]  Multiple Vitamin (MULTIVITAMIN) tablet Take 1 tablet by mouth at bedtime.     [provider]  spironolactone (ALDACTONE) 100 MG tablet TAKE 1 TABLET (100 MG TOTAL) BY MOUTH DAILY. 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 strain: Not on file   Food insecurity:    Worry: Not on file    Inability: Not on file   Transportation needs:    Medical: Not on file    Non-medical: Not on file  Tobacco Use   Smoking status: Former Smoker    Last attempt to quit: 05/13/1990    Years since quitting: 28.2   Smokeless tobacco: Never Used  Substance and Sexual Activity   Alcohol use: No    Alcohol/week: 0.0 standard drinks   Drug use: No   Sexual activity: Not on file  Lifestyle   Physical activity:    Days per week: Not on file    Minutes per session: Not on file   Stress: Not on file  Relationships   Social connections:    Talks on phone: Not on file     Gets together: Not on file    Attends religious service: Not on file    Active member of club or organization: Not on file    Attends meetings of clubs or organizations: Not on file    Relationship status: Not on file  Other Topics Concern   Not on file  Social History Narrative   Not on file    ECOG Status: 0 - Asymptomatic  Review of Systems: A 12 point ROS discussed and pertinent positives are indicated in the HPI above.  All other systems are negative.   Imaging: Ct Angio Abdomen W &/  or Wo Contrast  Result Date: 07/24/2018 CLINICAL DATA:  Patient states she has been having MRI scans for cirrhosis from NASH since 2016; In Dec 2019, patient was told she has hepatocellular carcinoma of liver. There has been mild increase in size of hypervascular liver lesion within posterior dome. Hx cholecystectomy 1985; RT breast incisional bx done, non-malignant/. Assess arterial supply of lesion EXAM: CT ANGIOGRAPHY ABDOMEN TECHNIQUE: Multidetector CT imaging of the abdomen was performed using the standard protocol during bolus administration of intravenous contrast. Multiplanar reconstructed images and MIPs were obtained and reviewed to evaluate the vascular anatomy. CONTRAST:  151m OMNIPAQUE IOHEXOL 350 MG/ML SOLN COMPARISON:  MR 04/16/2018 and previous FINDINGS: VASCULAR Aorta: Mild scattered calcified plaque. No dissection, aneurysm, or stenosis. Celiac: Patent without evidence of aneurysm, dissection, vasculitis or significant stenosis. Patent central hepatic arterial anatomy. SMA: Patent without evidence of aneurysm, dissection, vasculitis or significant stenosis. Renals: Single bilaterally, both with partially calcified nonocclusive ostial plaque, patent distally. IMA: Patent without evidence of aneurysm, dissection, vasculitis or significant stenosis. Inflow: Aortic bifurcation unremarkable Veins: Patent hepatic veins, portal vein, SMV, splenic vein. Circumaortic left renal vein, an anatomic  variant. Right renal vein patent. Visualized IVC patent. Small esophageal varices. Review of the MIP images confirms the above findings. NON-VASCULAR Lower chest: No acute abnormality. Hepatobiliary: 1.7 cm poorly marginated lesion in hepatic segment 7 with marked enhancement on arterial phase, supplied primarily by an anterior right hepatic artery branch. Nodular hepatic contour. Cholecystectomy clips. Ectatic CBD, seen to the ampulla. No intrahepatic biliary ductal dilatation. Pancreas: Unremarkable. No pancreatic ductal dilatation or surrounding inflammatory changes. Spleen: Scattered small calcified granulomas. Splenomegaly, 15 cm craniocaudal length. Adrenals/Urinary Tract: Normal adrenals. Normal kidneys. No hydronephrosis. Stomach/Bowel: The stomach is incompletely distended. Visualized portions of small bowel and colon are nondilated. Lymphatic: No abdominal or mesenteric adenopathy. Other: Abdominal ascites. No free air. Musculoskeletal: Mild lumbar spondylitic change. No fracture or worrisome bone lesion. IMPRESSION: VASCULAR 1. Anterior right hepatic arterial branch is primary supply to the hypervascular 1.7 cm hepatic segment 7 lesion. 2. Small esophageal varices. NON-VASCULAR:. 1. Cirrhosis. 2. Splenomegaly, ascites, and esophageal varices consistent with portal venous hypertension. Electronically Signed   By: DLucrezia EuropeM.D.   On: 07/24/2018 12:08    Labs:  CBC: Recent Labs    12/02/17 0856 04/14/18 1239 06/02/18 0950 07/23/18 1045  WBC 4.5 4.2 4.2 4.2  HGB 13.2 12.5 13.5 14.2  HCT 39.3 38.5 40.8 44.7  PLT 124* 135* 117* 123*    COAGS: Recent Labs    04/14/18 1239 07/23/18 1045  INR 1.03 1.0    BMP: Recent Labs    12/02/17 0856  04/14/18 1239  05/07/18 1100 06/02/18 0950 06/05/18 1030 07/23/18 1045  NA 141   < > 138   < > 134* 136 135 138  K 4.1   < > 4.3   < > 4.2 4.6 4.4 4.2  CL 105   < > 104   < > 98 101 98 104  CO2 27   < > 26   < > 30 25 29 24   GLUCOSE 112*    < > 124*   < > 112* 99 133* 97  BUN 10   < > 12   < > 15 12 17 15   CALCIUM 9.2   < > 9.0   < > 9.1 9.1 9.5 9.1  CREATININE 0.68   < > 0.71   < > 0.71 0.76 0.70 0.67  GFRNONAA >60  --  >  60  --   --  >60  --  >60  GFRAA >60  --  >60  --   --  >60  --  >60   < > = values in this interval not displayed.    LIVER FUNCTION TESTS: Recent Labs    12/02/17 0856 04/14/18 1239 06/02/18 0950 07/23/18 1045  BILITOT 0.6 0.6 0.7 0.9  AST 29 21 34 30  ALT 19 13 27 20   ALKPHOS 70 79 80 72  PROT 6.8 6.6 7.0 7.1  ALBUMIN 3.7 3.4* 3.8 4.0    TUMOR MARKERS: No results for input(s): AFPTM, CEA, CA199, CHROMGRNA in the last 8760 hours.  Assessment and Plan:  Mrs. Restivo has an approximately 1.9 cm arterially enhancing lesion in segment 7 of her liver, up high in the hepatic dome.  This lesion Li-RADS category 5 which is diagnostic for hepatocellular carcinoma in the setting of cirrhosis.  The tumor biology is likely will differentiated as it has had an indolent course over the last several years.  We have been following this since 2016 and it has grown very slowly over time.  Her most recent CT arteriogram demonstrates what appears to be a relatively well-defined arterial supply.  Due to her mild ascites, and the [place] of the lesion high in the hepatic dome, percutaneous thermal ablation would be challenging.  However, with the relatively well-defined arterial supply I suspect she would be an excellent candidate for radiation segmentectomy.  While the presence of ascites is a relative contraindication for whole liver or lobar Y 90, radiation segmentectomy assumes a much less significant risk.  Additionally, her bilirubin is normal.  I explained to her the details of the procedure including the risks, benefits and alternatives.  She understands and is grateful to have a treatment plan in place finally after following this lesion for the last several years.  She feels better knowing that she has a diagnosis  and a plan in place for treatment.  We also discussed that while her lesion does need treated, we do have a relatively generous time window given its indolent behavior over the past several years.  With the current coven pandemic in place, I would plan on treating her when elective cancer therapies pose an acceptable risk, likely in the next 4-6 weeks.  1.)  Please schedule for preprocedure planning hepatic arteriogram at some point in the next 4-6 weeks to be followed in 2 weeks with a right segment 7 radiation segmentectomy (3-day Flex dosing).     Electronically Signed: Jacqulynn Cadet 07/28/2018, 12:46 PM   I spent a total of 15 Minutes in face to face in clinical consultation, greater than 50% of which was counseling/coordinating care for hepatocellular cancer

## 2018-08-04 ENCOUNTER — Other Ambulatory Visit (INDEPENDENT_AMBULATORY_CARE_PROVIDER_SITE_OTHER): Payer: Medicare Other

## 2018-08-04 DIAGNOSIS — K7460 Unspecified cirrhosis of liver: Secondary | ICD-10-CM

## 2018-08-04 DIAGNOSIS — R188 Other ascites: Secondary | ICD-10-CM | POA: Diagnosis not present

## 2018-08-04 LAB — BASIC METABOLIC PANEL
BUN: 17 mg/dL (ref 6–23)
CALCIUM: 9.5 mg/dL (ref 8.4–10.5)
CO2: 29 mEq/L (ref 19–32)
Chloride: 101 mEq/L (ref 96–112)
Creatinine, Ser: 0.7 mg/dL (ref 0.40–1.20)
GFR: 80.85 mL/min (ref >60.00)
Glucose, Bld: 92 mg/dL (ref 70–99)
Potassium: 4.6 mEq/L (ref 3.5–5.1)
Sodium: 137 mEq/L (ref 135–145)

## 2018-08-05 ENCOUNTER — Other Ambulatory Visit: Payer: Self-pay

## 2018-08-05 DIAGNOSIS — K7460 Unspecified cirrhosis of liver: Secondary | ICD-10-CM

## 2018-08-10 ENCOUNTER — Encounter: Payer: Self-pay | Admitting: *Deleted

## 2018-08-12 ENCOUNTER — Telehealth: Payer: Self-pay | Admitting: Internal Medicine

## 2018-08-12 NOTE — Telephone Encounter (Signed)
Pt was scheduled for a webex visit with Dr. Henrene Pastor but has cancelled that appointment, states she does not have privacy to do the visit and would reschedule. She had Bmet done last week and is supposed to have repeat lab done in a week. Is pt ok to wait to reschedule or does she need to come in for an OV? Please advise.

## 2018-08-12 NOTE — Telephone Encounter (Signed)
Spoke with pt and she knows to repeat her labs in 1 month, order in epic. Pt will be rescheduled for an OV at a later date due to covid-19.

## 2018-08-12 NOTE — Telephone Encounter (Signed)
Pt is wanting to speak with the nurse about her reoccurring lab work and do she still need to have the lab works.

## 2018-08-12 NOTE — Telephone Encounter (Signed)
Ok to r/s. Call for problems. Get blood work as recommended. Thanks

## 2018-08-13 ENCOUNTER — Ambulatory Visit: Payer: Medicare Other | Admitting: Internal Medicine

## 2018-09-01 ENCOUNTER — Other Ambulatory Visit: Payer: Self-pay | Admitting: Endocrinology

## 2018-09-04 ENCOUNTER — Other Ambulatory Visit (INDEPENDENT_AMBULATORY_CARE_PROVIDER_SITE_OTHER): Payer: Medicare Other

## 2018-09-04 DIAGNOSIS — K7460 Unspecified cirrhosis of liver: Secondary | ICD-10-CM | POA: Diagnosis not present

## 2018-09-04 LAB — BASIC METABOLIC PANEL
BUN: 17 mg/dL (ref 6–23)
CO2: 31 mEq/L (ref 19–32)
Calcium: 9.1 mg/dL (ref 8.4–10.5)
Chloride: 100 mEq/L (ref 96–112)
Creatinine, Ser: 0.79 mg/dL (ref 0.40–1.20)
GFR: 70.3 mL/min (ref >60.00)
Glucose, Bld: 98 mg/dL (ref 70–99)
Potassium: 4.3 mEq/L (ref 3.5–5.1)
Sodium: 138 mEq/L (ref 135–145)

## 2018-09-07 ENCOUNTER — Other Ambulatory Visit: Payer: Self-pay | Admitting: *Deleted

## 2018-09-07 DIAGNOSIS — K7460 Unspecified cirrhosis of liver: Secondary | ICD-10-CM

## 2018-09-30 ENCOUNTER — Other Ambulatory Visit (HOSPITAL_COMMUNITY): Payer: Self-pay | Admitting: Interventional Radiology

## 2018-09-30 DIAGNOSIS — C220 Liver cell carcinoma: Secondary | ICD-10-CM

## 2018-10-06 ENCOUNTER — Other Ambulatory Visit (INDEPENDENT_AMBULATORY_CARE_PROVIDER_SITE_OTHER): Payer: Medicare Other

## 2018-10-06 ENCOUNTER — Other Ambulatory Visit: Payer: Self-pay

## 2018-10-06 DIAGNOSIS — K7460 Unspecified cirrhosis of liver: Secondary | ICD-10-CM | POA: Diagnosis not present

## 2018-10-06 DIAGNOSIS — R188 Other ascites: Secondary | ICD-10-CM | POA: Diagnosis not present

## 2018-10-06 LAB — BASIC METABOLIC PANEL
BUN: 11 mg/dL (ref 6–23)
CO2: 25 mEq/L (ref 19–32)
Calcium: 9 mg/dL (ref 8.4–10.5)
Chloride: 104 mEq/L (ref 96–112)
Creatinine, Ser: 0.72 mg/dL (ref 0.40–1.20)
GFR: 78.23 mL/min (ref >60.00)
Glucose, Bld: 101 mg/dL — ABNORMAL HIGH (ref 70–99)
Potassium: 4.5 mEq/L (ref 3.5–5.1)
Sodium: 137 mEq/L (ref 135–145)

## 2018-10-16 DIAGNOSIS — Z1389 Encounter for screening for other disorder: Secondary | ICD-10-CM | POA: Diagnosis not present

## 2018-10-16 DIAGNOSIS — E119 Type 2 diabetes mellitus without complications: Secondary | ICD-10-CM | POA: Diagnosis not present

## 2018-10-16 DIAGNOSIS — J302 Other seasonal allergic rhinitis: Secondary | ICD-10-CM | POA: Diagnosis not present

## 2018-10-16 DIAGNOSIS — E041 Nontoxic single thyroid nodule: Secondary | ICD-10-CM | POA: Diagnosis not present

## 2018-10-16 DIAGNOSIS — D696 Thrombocytopenia, unspecified: Secondary | ICD-10-CM | POA: Diagnosis not present

## 2018-10-16 DIAGNOSIS — M810 Age-related osteoporosis without current pathological fracture: Secondary | ICD-10-CM | POA: Diagnosis not present

## 2018-10-16 DIAGNOSIS — K7581 Nonalcoholic steatohepatitis (NASH): Secondary | ICD-10-CM | POA: Diagnosis not present

## 2018-10-16 DIAGNOSIS — L989 Disorder of the skin and subcutaneous tissue, unspecified: Secondary | ICD-10-CM | POA: Diagnosis not present

## 2018-10-16 DIAGNOSIS — Z0000 Encounter for general adult medical examination without abnormal findings: Secondary | ICD-10-CM | POA: Diagnosis not present

## 2018-10-16 DIAGNOSIS — E039 Hypothyroidism, unspecified: Secondary | ICD-10-CM | POA: Diagnosis not present

## 2018-10-16 DIAGNOSIS — E559 Vitamin D deficiency, unspecified: Secondary | ICD-10-CM | POA: Diagnosis not present

## 2018-10-16 DIAGNOSIS — I10 Essential (primary) hypertension: Secondary | ICD-10-CM | POA: Diagnosis not present

## 2018-10-28 ENCOUNTER — Other Ambulatory Visit: Payer: Self-pay | Admitting: Family Medicine

## 2018-10-28 ENCOUNTER — Telehealth: Payer: Self-pay | Admitting: Internal Medicine

## 2018-10-28 ENCOUNTER — Telehealth: Payer: Self-pay

## 2018-10-28 DIAGNOSIS — Z1231 Encounter for screening mammogram for malignant neoplasm of breast: Secondary | ICD-10-CM

## 2018-10-28 DIAGNOSIS — M810 Age-related osteoporosis without current pathological fracture: Secondary | ICD-10-CM

## 2018-10-28 MED ORDER — SPIRONOLACTONE 100 MG PO TABS: 100.0000 mg | ORAL_TABLET | Freq: Every day | ORAL | 1 refills | Status: DC

## 2018-10-28 MED ORDER — FUROSEMIDE 40 MG PO TABS: 40.0000 mg | ORAL_TABLET | Freq: Every day | ORAL | 1 refills | Status: DC

## 2018-10-28 NOTE — Telephone Encounter (Signed)
Patient states she has spoken with Crystal Barnett before about her [facility] paperwork.  I told her I would send Crystal Barnett a message for when she gets back next week to call her about this.  Patient agreed.

## 2018-10-28 NOTE — Telephone Encounter (Signed)
Pt requested refills on furosemide and spironolactone--would like a call back to discuss aflac (sp?_) paperwork.

## 2018-10-28 NOTE — Telephone Encounter (Signed)
Refilled Lasix and Aldactone.  Called patient; Lm on vm

## 2018-10-30 ENCOUNTER — Other Ambulatory Visit: Payer: Self-pay | Admitting: Internal Medicine

## 2018-11-02 ENCOUNTER — Other Ambulatory Visit: Payer: Self-pay | Admitting: Radiology

## 2018-11-02 ENCOUNTER — Telehealth: Payer: Self-pay

## 2018-11-02 NOTE — Telephone Encounter (Signed)
Spoke with pt and she is wanting help filling out/filing a cancer policy and wanted to know what office she should speak with. Discussed with pt that would be the oncologist office. Pt verbalized understanding and thanked me for the call.

## 2018-11-02 NOTE — Telephone Encounter (Signed)
-----   Message from Audrea Muscat, [place] sent at 10/28/2018  4:34 PM EDT ----- This patient called and stated she had talked to you in the past about her [facility] paperwork.  She mentioned that you said "Patty" might be able to help.  Patty did not know what I was talking about so patient asked if you could call her when you got back.

## 2018-11-03 ENCOUNTER — Encounter (HOSPITAL_COMMUNITY): Payer: Self-pay

## 2018-11-03 ENCOUNTER — Encounter (HOSPITAL_COMMUNITY)
Admission: RE | Admit: 2018-11-03 | Discharge: 2018-11-03 | Disposition: A | Discharge status: Home / Self Care | Payer: Medicare Other | Source: Ambulatory Visit | Attending: Interventional Radiology | Admitting: Interventional Radiology

## 2018-11-03 ENCOUNTER — Ambulatory Visit (HOSPITAL_COMMUNITY)
Admission: RE | Admit: 2018-11-03 | Discharge: 2018-11-03 | Disposition: A | Discharge status: Home / Self Care | Payer: Medicare Other | Source: Ambulatory Visit | Attending: Interventional Radiology | Admitting: Interventional Radiology

## 2018-11-03 ENCOUNTER — Other Ambulatory Visit: Payer: Self-pay

## 2018-11-03 ENCOUNTER — Other Ambulatory Visit (HOSPITAL_COMMUNITY): Payer: Self-pay | Admitting: Interventional Radiology

## 2018-11-03 DIAGNOSIS — E039 Hypothyroidism, unspecified: Secondary | ICD-10-CM | POA: Diagnosis not present

## 2018-11-03 DIAGNOSIS — Z79890 Hormone replacement therapy (postmenopausal): Secondary | ICD-10-CM | POA: Diagnosis not present

## 2018-11-03 DIAGNOSIS — K7581 Nonalcoholic steatohepatitis (NASH): Secondary | ICD-10-CM | POA: Diagnosis not present

## 2018-11-03 DIAGNOSIS — E785 Hyperlipidemia, unspecified: Secondary | ICD-10-CM | POA: Diagnosis not present

## 2018-11-03 DIAGNOSIS — C220 Liver cell carcinoma: Secondary | ICD-10-CM

## 2018-11-03 DIAGNOSIS — Z823 Family history of stroke: Secondary | ICD-10-CM | POA: Diagnosis not present

## 2018-11-03 DIAGNOSIS — Z79899 Other long term (current) drug therapy: Secondary | ICD-10-CM | POA: Insufficient documentation

## 2018-11-03 DIAGNOSIS — E119 Type 2 diabetes mellitus without complications: Secondary | ICD-10-CM | POA: Insufficient documentation

## 2018-11-03 DIAGNOSIS — Z87891 Personal history of nicotine dependence: Secondary | ICD-10-CM | POA: Insufficient documentation

## 2018-11-03 DIAGNOSIS — D696 Thrombocytopenia, unspecified: Secondary | ICD-10-CM | POA: Insufficient documentation

## 2018-11-03 DIAGNOSIS — K769 Liver disease, unspecified: Secondary | ICD-10-CM | POA: Diagnosis not present

## 2018-11-03 DIAGNOSIS — K7460 Unspecified cirrhosis of liver: Secondary | ICD-10-CM | POA: Insufficient documentation

## 2018-11-03 DIAGNOSIS — K7689 Other specified diseases of liver: Secondary | ICD-10-CM | POA: Diagnosis not present

## 2018-11-03 DIAGNOSIS — Z7984 Long term (current) use of oral hypoglycemic drugs: Secondary | ICD-10-CM | POA: Diagnosis not present

## 2018-11-03 DIAGNOSIS — Z803 Family history of malignant neoplasm of breast: Secondary | ICD-10-CM | POA: Diagnosis not present

## 2018-11-03 DIAGNOSIS — M810 Age-related osteoporosis without current pathological fracture: Secondary | ICD-10-CM | POA: Diagnosis not present

## 2018-11-03 DIAGNOSIS — I10 Essential (primary) hypertension: Secondary | ICD-10-CM | POA: Insufficient documentation

## 2018-11-03 HISTORY — PX: IR US GUIDE VASC ACCESS RIGHT: IMG2390

## 2018-11-03 HISTORY — PX: IR ANGIOGRAM SELECTIVE EACH ADDITIONAL VESSEL: IMG667

## 2018-11-03 HISTORY — PX: IR ANGIOGRAM VISCERAL SELECTIVE: IMG657

## 2018-11-03 LAB — BASIC METABOLIC PANEL
Anion gap: 11 (ref 5–15)
BUN: 11 mg/dL (ref 8–23)
CO2: 23 mmol/L (ref 22–32)
Calcium: 9.2 mg/dL (ref 8.9–10.3)
Chloride: 103 mmol/L (ref 98–111)
Creatinine, Ser: 0.69 mg/dL (ref 0.44–1.00)
GFR calc Af Amer: >60 mL/min (ref >60)
GFR calc non Af Amer: >60 mL/min (ref >60)
Glucose, Bld: 106 mg/dL — ABNORMAL HIGH (ref 70–99)
Potassium: 4.2 mmol/L (ref 3.5–5.1)
Sodium: 137 mmol/L (ref 135–145)

## 2018-11-03 LAB — CBC
HCT: 40.6 % (ref 36.0–46.0)
Hemoglobin: 13.5 g/dL (ref 12.0–15.0)
MCH: 29.8 pg (ref 26.0–34.0)
MCHC: 33.3 g/dL (ref 30.0–36.0)
MCV: 89.6 fL (ref 80.0–100.0)
Platelets: 126 10*3/uL — ABNORMAL LOW (ref 150–400)
RBC: 4.53 MIL/uL (ref 3.87–5.11)
RDW: 12.8 % (ref 11.5–15.5)
WBC: 4.7 10*3/uL (ref 4.0–10.5)
nRBC: 0 % (ref 0.0–0.2)

## 2018-11-03 LAB — GLUCOSE, CAPILLARY: Glucose-Capillary: 105 mg/dL — ABNORMAL HIGH (ref 70–99)

## 2018-11-03 LAB — APTT: aPTT: 28 seconds (ref 24–36)

## 2018-11-03 LAB — PROTIME-INR
INR: 1.1 (ref 0.8–1.2)
Prothrombin Time: 13.7 seconds (ref 11.4–15.2)

## 2018-11-03 MED ORDER — TECHNETIUM TO 99M ALBUMIN AGGREGATED
5.4000 | Freq: Once | INTRAVENOUS | Status: AC | PRN
  Administered 2018-11-03: 5.4 via INTRAVENOUS

## 2018-11-03 MED ORDER — MIDAZOLAM HCL 2 MG/2ML IJ SOLN
INTRAMUSCULAR | Status: AC | PRN
  Administered 2018-11-03: 1 mg via INTRAVENOUS

## 2018-11-03 MED ORDER — IOHEXOL 300 MG/ML  SOLN
100.0000 mL | Freq: Once | INTRAMUSCULAR | Status: AC | PRN
  Administered 2018-11-03: 13 mL via INTRA_ARTERIAL

## 2018-11-03 MED ORDER — TECHNETIUM TO 99M ALBUMIN AGGREGATED: 5.4000 | Freq: Once | INTRAVENOUS | Status: DC | PRN

## 2018-11-03 MED ORDER — IOHEXOL 300 MG/ML  SOLN
100.0000 mL | Freq: Once | INTRAMUSCULAR | Status: AC | PRN
  Administered 2018-11-03: 11:00:00 32 mL via INTRA_ARTERIAL

## 2018-11-03 MED ORDER — SODIUM CHLORIDE 0.9 % IV SOLN
INTRAVENOUS | Status: DC
  Administered 2018-11-03: 09:00:00 via INTRAVENOUS
  Administered 2018-11-03: 100 mL via INTRAVENOUS

## 2018-11-03 MED ORDER — FENTANYL CITRATE (PF) 100 MCG/2ML IJ SOLN
INTRAMUSCULAR | Status: AC | PRN
  Administered 2018-11-03: 50 ug via INTRAVENOUS

## 2018-11-03 MED ORDER — FENTANYL CITRATE (PF) 100 MCG/2ML IJ SOLN
INTRAMUSCULAR | Status: AC
  Filled 2018-11-03: qty 2

## 2018-11-03 MED ORDER — LIDOCAINE HCL 1 % IJ SOLN
INTRAMUSCULAR | Status: AC
  Filled 2018-11-03: qty 20

## 2018-11-03 MED ORDER — MIDAZOLAM HCL 2 MG/2ML IJ SOLN
INTRAMUSCULAR | Status: AC
  Filled 2018-11-03: qty 2

## 2018-11-03 NOTE — H&P (Signed)
Chief Complaint: Patient was seen in consultation today for hepatic cancer  Referring Physician(s): Dr. Irene Limbo  Supervising Physician: Jacqulynn Cadet  Patient Status: [place]  History of Present Illness: Crystal Barnett is a 78 y.o. 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 She was found to have a hepatic lesion by MR imaging in December 2016 and has followed the lesion along with Dr. Irene Limbo and Dr. Laurence Ferrari for several years.  Her most recent imaging from 04/16/18 showed efinitive enlargement with an enhancing pseudocapsule which warrants intervention.  This was dicussed between the patient and Dr. Laurence Ferrari by telephone consultation in March 2020 at which time the patient elected to proceed with Y90 treatment of her Li-RADS 5 hepatocellular carcinoma.  She presents today for pre-Y90 roadmapping and embolization.  She has been NPO.  She does not take blood thinners.  She denies fever, chills, nausea, vomiting, abdominal pain, cough, shortness of breath, dysuria.   Past Medical History:  Diagnosis Date  . ALLERGIC RHINITIS 01/12/2008  . Allergy   . Cataract    bilateral  . Colon polyps    hyperplastic  . Diabetes ([place])   . Esophageal varices ([place])   . Fatty liver   . GOITER, MULTINODULAR 01/12/2008  . Hepatic cirrhosis ([place])   . Hyperlipidemia   . Hypertension   . HYPOTHYROIDISM 09/07/2008  . 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 Liver cirrhosis secondary to NASH ([place])   . Osteoporosis   . 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 Splenomegaly   . Thrombocytopenia ([place])    Appears chronic from at least 2014  . Vitamin D deficiency     Past Surgical History:  Procedure Laterality Date  . arthroscopic left knee  2005  . BREAST BIOPSY Left 2017  . BREAST EXCISIONAL BIOPSY Right 2008  . BREAST SURGERY  2008   Breast Biopsy   . CHOLECYSTECTOMY  1985  . COLONOSCOPY    . IR  GENERIC HISTORICAL  04/02/2016   IR RADIOLOGIST EVAL & MGMT 04/02/2016 Jacqulynn Cadet, MD [facility] INTERV RAD  . IR PARACENTESIS  02/02/2018  . IR PARACENTESIS  04/20/2018  . IR RADIOLOGIST EVAL & MGMT  10/29/2016  . IR RADIOLOGIST EVAL & MGMT  04/29/2017  . IR RADIOLOGIST EVAL & MGMT  11/04/2017  . IR RADIOLOGIST EVAL & MGMT  04/16/2018  . POLYPECTOMY    . UPPER GASTROINTESTINAL ENDOSCOPY      Allergies: Patient has no known allergies.  Medications: Prior to Admission medications   Medication Sig Start Date End Date Taking? Authorizing Provider  Blood Glucose Monitoring Suppl ([place]) w/Device KIT OneTouch Verio System  USE TO TEST ONCE D UTD   Yes [provider]  Cholecalciferol (VITAMIN D) 50 MCG (2000 UT) CAPS Take 2,000 Units by mouth at bedtime.   Yes [provider]  fluticasone (FLONASE) 50 MCG/ACT nasal spray Place 1 spray into both nostrils daily.    Yes [provider]  furosemide (LASIX) 40 MG tablet TAKE 1 TABLET (40 MG TOTAL) BY MOUTH DAILY. 10/30/18  Yes Irene Shipper, MD  Lactobacillus (PROBIOTIC ACIDOPHILUS) CAPS Take 1 capsule by mouth 2 (two) times a week.    Yes [provider]  levothyroxine (SYNTHROID) 50 MCG tablet TAKE 1 TABLET EVERY DAY 09/02/18  Yes Renato Shin, MD  metFORMIN (GLUCOPHAGE) 500 MG tablet Take 500 mg by mouth every evening. Take  1 tablet by mouth once daily    Yes [provider]  montelukast (SINGULAIR) 10 MG tablet Take 10 mg by mouth at bedtime.  03/25/14  Yes [provider]  Multiple Vitamin (MULTIVITAMIN) tablet Take 1 tablet by mouth at bedtime.    Yes [provider]  spironolactone (ALDACTONE) 100 MG tablet TAKE 1 TABLET (100 MG TOTAL) BY MOUTH DAILY. 10/30/18  Yes Irene Shipper, MD  alendronate (FOSAMAX) 70 MG tablet Take 70 mg by mouth once a week. On Saturday 03/24/17   [provider]     Family History  Problem Relation Age of Onset  . Goiter  Maternal Grandmother   . Stroke Father 22  . Breast cancer Mother        in 98's  . Colon cancer Neg Hx   . Colon polyps Neg Hx   . Esophageal cancer Neg Hx   . Stomach cancer Neg Hx   . Rectal cancer Neg Hx     Social History   Socioeconomic History  . Marital status: Single    Spouse name: Not on file  . Number of children: 1  . Years of education: Not on file  . Highest education level: Not on file  Occupational History  . 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 Financial resource strain: Not on file  . Food insecurity    Worry: Not on file    Inability: Not on file  . Transportation needs    Medical: Not on file    Non-medical: Not on file  Tobacco Use  . Smoking status: Former Smoker    Quit date: 05/13/1990    Years since quitting: 28.4  . Smokeless tobacco: Never Used  Substance and Sexual Activity  . Alcohol use: No    Alcohol/week: 0.0 standard drinks  . Drug use: No  . Sexual activity: Not on file  Lifestyle  . Physical activity    Days per week: Not on file    Minutes per session: Not on file  . Stress: Not on file  Relationships  . Social Herbalist on phone: Not on file    Gets together: Not on file    Attends religious service: Not on file    Active member of club or organization: Not on file    Attends meetings of clubs or organizations: Not on file    Relationship status: Not on file  Other Topics Concern  . Not on file  Social History Narrative  . Not on file     Review of Systems: A 12 point ROS discussed and pertinent positives are indicated in the HPI above.  All other systems are negative.  Review of Systems  Constitutional: Negative for fatigue and fever.  Respiratory: Negative for cough and shortness of breath.   Cardiovascular: Negative for chest pain.  Gastrointestinal: Negative for abdominal pain.  Genitourinary: Negative for dysuria.  Musculoskeletal:  Negative for back pain.  Psychiatric/Behavioral: Negative for behavioral problems and confusion.    Vital Signs: BP (!) 143/73 (BP Location: Right Arm)   Temp (!) 97.5 F (36.4 C) (Oral)   Resp 18   SpO2 94%   Physical Exam Vitals signs and nursing note reviewed.  Constitutional:      Appearance: Normal appearance.  Cardiovascular:     Rate and Rhythm: Normal rate.     Comments: Sinus arrythmia Pulmonary:  Effort: Pulmonary effort is normal. No respiratory distress.     Breath sounds: Normal breath sounds.  Skin:    General: Skin is warm and dry.  Neurological:     General: No focal deficit present.     Mental Status: She is alert and oriented to person, place, and time. Mental status is at baseline.  Psychiatric:        Mood and Affect: Mood normal.        Behavior: Behavior normal.        Thought Content: Thought content normal.        Judgment: Judgment normal.      MD Evaluation Airway: WNL Heart: WNL Abdomen: WNL Chest/ Lungs: WNL ASA  Classification: 3 Mallampati/Airway Score: One   Imaging: No results found.  Labs:  CBC: Recent Labs    04/14/18 1239 06/02/18 0950 07/23/18 1045 11/03/18 0829  WBC 4.2 4.2 4.2 4.7  HGB 12.5 13.5 14.2 13.5  HCT 38.5 40.8 44.7 40.6  PLT 135* 117* 123* 126*    COAGS: Recent Labs    04/14/18 1239 07/23/18 1045 11/03/18 0829  INR 1.03 1.0 1.1  APTT  --   --  28    BMP: Recent Labs    04/14/18 1239  06/02/18 0950  07/23/18 1045 08/04/18 0952 09/04/18 1026 10/06/18 1014 11/03/18 0829  NA 138   < > 136   < > 138 137 138 137 137  K 4.3   < > 4.6   < > 4.2 4.6 4.3 4.5 4.2  CL 104   < > 101   < > 104 101 100 104 103  CO2 26   < > 25   < > 24 29 31 25 23   GLUCOSE 124*   < > 99   < > 97 92 98 101* 106*  BUN 12   < > 12   < > 15 17 17 11 11   CALCIUM 9.0   < > 9.1   < > 9.1 9.5 9.1 9.0 9.2  CREATININE 0.71   < > 0.76   < > 0.67 0.70 0.79 0.72 0.69  GFRNONAA >60  --  >60  --  >60  --   --   --  >60   GFRAA >60  --  >60  --  >60  --   --   --  >60   < > = values in this interval not displayed.    LIVER FUNCTION TESTS: Recent Labs    12/02/17 0856 04/14/18 1239 06/02/18 0950 07/23/18 1045  BILITOT 0.6 0.6 0.7 0.9  AST 29 21 34 30  ALT 19 13 27 20   ALKPHOS 70 79 80 72  PROT 6.8 6.6 7.0 7.1  ALBUMIN 3.7 3.4* 3.8 4.0    TUMOR MARKERS: No results for input(s): AFPTM, CEA, CA199, CHROMGRNA in the last 8760 hours.  Assessment and Plan: Patient with past medical history of NASH cirrhosis, mild ascites, and thrombocytopenia presents with complaint of enlarging, enhancing hepatocellular carcinoma by Li-RADS characteristics.  Patient has been following with Dr. Laurence Ferrari for imaging surveillance in the setting of suspected indolent course over the past several years.  Her most recent imaging shows more concerning features which warrants treatment at this time. She elects to proceed with Y90 radioembolization.  Patient presents today in their usual state of health.  She has been NPO and is not currently on blood thinners.  Her platelets are stable today at 126.  Risks and benefits discussed  with the patient including, but not limited to bleeding, infection, vascular injury, and possible need for additional procedures.  All of the patient's questions were answered, patient is agreeable to proceed. Consent signed and in chart.    Thank you for this interesting consult.  I greatly enjoyed meeting Crystal Barnett and look forward to participating in their care.  A copy of this report was sent to the requesting provider on this date.  Electronically Signed: Docia Barrier, PA 11/03/2018, 9:20 AM   I spent a total of  15 Minutes   in face to face in clinical consultation, greater than 50% of which was counseling/coordinating care for hepatocellular carcinoma.

## 2018-11-03 NOTE — Procedures (Signed)
Interventional Radiology Procedure Note  Procedure: Pre Y90 mapping study  Complications: None  Estimated Blood Loss: None  Recommendations: - To molecular imaging - Bedrest x 4 hrs - DC home  Signed,  Criselda Peaches, MD

## 2018-11-03 NOTE — Discharge Instructions (Signed)
Moderate Conscious Sedation, Adult, Care After These instructions provide you with information about caring for yourself after your procedure. Your health care provider may also give you more specific instructions. Your treatment has been planned according to current medical practices, but problems sometimes occur. Call your health care provider if you have any problems or questions after your procedure. What can I expect after the procedure? After your procedure, it is common:  To feel sleepy for several hours.  To feel clumsy and have poor balance for several hours.  To have poor judgment for several hours.  To vomit if you eat too soon. Follow these instructions at home: For at least 24 hours after the procedure:   Do not: ? Participate in activities where you could fall or become injured. ? Drive. ? Use heavy machinery. ? Drink alcohol. ? Take sleeping pills or medicines that cause drowsiness. ? Make important decisions or sign legal documents. ? Take care of children on your own.  Rest. Eating and drinking  Follow the diet recommended by your health care provider.  If you vomit: ? Drink water, juice, or soup when you can drink without vomiting. ? Make sure you have little or no nausea before eating solid foods. General instructions  Have a responsible adult stay with you until you are awake and alert.  Take over-the-counter and prescription medicines only as told by your health care provider.  If you smoke, do not smoke without supervision.  Keep all follow-up visits as told by your health care provider. This is important. Contact a health care provider if:  You keep feeling nauseous or you keep vomiting.  You feel light-headed.  You develop a rash.  You have a fever. Get help right away if:  You have trouble breathing. This information is not intended to replace advice given to you by your health care provider. Make sure you discuss any questions you have  with your health care provider. Document Released: 02/17/2013 Document Revised: 10/02/2015 Document Reviewed: 08/19/2015 Elsevier Interactive Patient Education  2019 [employer]   Hepatic Artery Radioembolization Mapping, Care After This sheet gives you information about how to care for yourself after your procedure. Your health care provider may also give you more specific instructions. If you have problems or questions, contact your health care provider. What can I expect after the procedure? After the procedure, it is common to have:  A slight fever for 1-2 weeks. If your fever gets worse, tell your health care provider.  Fatigue.  Loss of appetite. This should gradually improve after about 1 week.  Abdominal pain on your right side.  Soreness and tenderness in your groin area where the needle and catheter were placed (puncture site). Follow these instructions at home:  Puncture site care  Follow instructions from your health care provider about how to take care of the puncture site. Make sure you: ? Wash your hands with soap and water before you change your bandage (dressing). If soap and water are not available, use hand sanitizer. ? Change your dressing as told by your health care provider. ? Leave stitches (sutures), skin glue, or adhesive strips in place. These skin closures may need to stay in place for 2 weeks or longer. If adhesive strip edges start to loosen and curl up, you may trim the loose edges. Do not remove adhesive strips completely unless your health care provider tells you to do that.  Check your puncture site every day for signs of infection. Check for: ?  More redness, swelling, or pain. ? More fluid or blood. ? Warmth. ? Pus or a bad smell. Activity  Rest and return to your normal activities as told by your health care provider. Ask your health care provider what activities are safe for you.  Do not drive for 24 hours after the procedure if you were  given a medicine to help you relax (sedative).  Do not lift anything that is heavier than 10 lb (4.5 kg) until your health care provider says that it is safe. Medicines  Take over-the-counter and prescription medicines only as told by your health care provider.  Do not drive or use heavy machinery while taking prescription pain medicine. Radiation precautions  For up to a week after your procedure, there will be a small amount of radioactivity near your liver. This is not especially dangerous to other people. However, you should follow these precautions for 7 days: ? Do not come in close contact with people. ? Do not sleep in the same bed as someone else. ? Do not hold children or babies. ? Do not have contact with pregnant women. General instructions  To prevent or treat constipation while you are taking prescription pain medicine, your health care provider may recommend that you: ? Drink enough fluid to keep your urine clear or pale yellow. ? Take over-the-counter or prescription medicines. ? Eat foods that are high in fiber, such as fresh fruits and vegetables, whole grains, and beans. ? Limit foods that are high in fat and processed sugars, such as fried and sweet foods.  Eat frequent small meals until your appetite returns. Follow instructions from your health care provider about eating or drinking restrictions.  Do not take baths, swim, or use a hot tub until your health care provider approves. You may take showers. Wash your puncture site with mild soap and water and pat the area dry.  Wear compression stockings as told by your health care provider. These stockings help to prevent blood clots and reduce swelling in your legs.  Keep all follow-up visits as told by your health care provider. This is important. You may need to have blood tests and imaging tests done. Contact a health care provider if:  You have more redness, swelling, or pain around your puncture site.  You have  more fluid or blood coming from your puncture site.  Your puncture site feels warm to the touch.  You have pus or a bad smell coming from your puncture site.  You have pain that: ? Gets worse. ? Does not get better with medicine. ? Feels like very bad heartburn. ? Is in the middle of your abdomen, above your belly button.  Your skin and the white parts of your eyes turn yellow (jaundice).  The color of your urine changes to dark brown.  The color of your stool changes to light yellow.  Your abdominal measurement (girth) increases in a short period of time.  You gain more than 5 lb (2.3 kg) in a short period of time. Get help right away if:  You have a fever that lasts longer than 2 weeks or is higher than what your health care provider told you to expect.  You develop any of the following in your legs: ? Pain. ? Swelling. ? Skin that is cold or pale or turns blue.  You have chest pain.  You have blood in your vomit, saliva, or stool.  You have trouble breathing. This information is not intended to replace  advice given to you by your health care provider. Make sure you discuss any questions you have with your health care provider. Document Released: 05/04/2013 Document Revised: 01/27/2016 Document Reviewed: 01/27/2016 Elsevier Interactive Patient Education  2019 [employer].    Instructions after next procedure in 2 weeks  Post Y-90 Radioembolization Discharge Instructions  You have been given a radioactive material during your procedure.  While it is safe for you to be discharged home from the hospital, you need to proceed directly home.    Do not use public transportation, including air travel, lasting more than 2 hours for 1 week.  Avoid crowded public places for 1 week.  Adult visitors should try to avoid close contact with you for 1 week.    Children and pregnant females should not visit or have close contact with you for 1 week.  Items that you touch are  not radioactive.  Do not sleep in the same bed as your partner for 1 week, and a condom should be used for sexual activity during the first 24 hours.  Your blood may be radioactive and caution should be used if any bleeding occurs during the recovery period.  Body fluids may be radioactive for 24 hours.  Wash your hands after voiding.  Men should sit to urinate.  Dispose of any soiled materials (flush down toilet or place in trash at home) during the first day.  Drink 6 to 8 glasses of fluids per day for 5 days to hydrate yourself.  If you need to see a doctor during the first week, you must let them know that you were treated with yttrium-90 microspheres, and will be slightly radioactive.  They can call Interventional Radiology [phone] with any questions.

## 2018-11-06 ENCOUNTER — Telehealth: Payer: Self-pay | Admitting: *Deleted

## 2018-11-06 NOTE — Telephone Encounter (Signed)
Patient called - left voice mail asking if office could provide any assistance with some insurance forms. Contacted patient, provided information to [facility] Mychart enrollment so that patient can access her records and MD notes.

## 2018-11-17 ENCOUNTER — Other Ambulatory Visit (HOSPITAL_COMMUNITY): Payer: Medicare Other

## 2018-11-17 ENCOUNTER — Ambulatory Visit (HOSPITAL_COMMUNITY): Payer: Medicare Other

## 2018-11-17 ENCOUNTER — Other Ambulatory Visit (INDEPENDENT_AMBULATORY_CARE_PROVIDER_SITE_OTHER): Payer: Medicare Other

## 2018-11-17 DIAGNOSIS — K7460 Unspecified cirrhosis of liver: Secondary | ICD-10-CM | POA: Diagnosis not present

## 2018-11-17 LAB — BASIC METABOLIC PANEL
BUN: 14 mg/dL (ref 6–23)
CO2: 26 mEq/L (ref 19–32)
Calcium: 9 mg/dL (ref 8.4–10.5)
Chloride: 103 mEq/L (ref 96–112)
Creatinine, Ser: 0.69 mg/dL (ref 0.40–1.20)
GFR: 82.14 mL/min (ref >60.00)
Glucose, Bld: 93 mg/dL (ref 70–99)
Potassium: 4.4 mEq/L (ref 3.5–5.1)
Sodium: 137 mEq/L (ref 135–145)

## 2018-11-18 ENCOUNTER — Other Ambulatory Visit: Payer: Self-pay

## 2018-11-18 ENCOUNTER — Other Ambulatory Visit: Payer: Self-pay | Admitting: Student

## 2018-11-18 DIAGNOSIS — K7460 Unspecified cirrhosis of liver: Secondary | ICD-10-CM

## 2018-11-19 ENCOUNTER — Other Ambulatory Visit: Payer: Self-pay | Admitting: Student

## 2018-11-20 ENCOUNTER — Other Ambulatory Visit (HOSPITAL_COMMUNITY): Payer: Self-pay | Admitting: Interventional Radiology

## 2018-11-20 ENCOUNTER — Ambulatory Visit (HOSPITAL_COMMUNITY)
Admission: RE | Admit: 2018-11-20 | Discharge: 2018-11-20 | Disposition: A | Discharge status: Home / Self Care | Payer: Medicare Other | Source: Ambulatory Visit | Attending: Interventional Radiology | Admitting: Interventional Radiology

## 2018-11-20 ENCOUNTER — Encounter (HOSPITAL_COMMUNITY)
Admission: RE | Admit: 2018-11-20 | Discharge: 2018-11-20 | Disposition: A | Discharge status: Home / Self Care | Payer: Medicare Other | Source: Ambulatory Visit | Attending: Interventional Radiology | Admitting: Interventional Radiology

## 2018-11-20 ENCOUNTER — Encounter (HOSPITAL_COMMUNITY): Payer: Self-pay

## 2018-11-20 ENCOUNTER — Other Ambulatory Visit: Payer: Self-pay

## 2018-11-20 DIAGNOSIS — M810 Age-related osteoporosis without current pathological fracture: Secondary | ICD-10-CM | POA: Diagnosis not present

## 2018-11-20 DIAGNOSIS — E039 Hypothyroidism, unspecified: Secondary | ICD-10-CM | POA: Insufficient documentation

## 2018-11-20 DIAGNOSIS — E785 Hyperlipidemia, unspecified: Secondary | ICD-10-CM | POA: Diagnosis not present

## 2018-11-20 DIAGNOSIS — E119 Type 2 diabetes mellitus without complications: Secondary | ICD-10-CM | POA: Insufficient documentation

## 2018-11-20 DIAGNOSIS — K7581 Nonalcoholic steatohepatitis (NASH): Secondary | ICD-10-CM | POA: Diagnosis not present

## 2018-11-20 DIAGNOSIS — Z79899 Other long term (current) drug therapy: Secondary | ICD-10-CM | POA: Diagnosis not present

## 2018-11-20 DIAGNOSIS — C220 Liver cell carcinoma: Secondary | ICD-10-CM

## 2018-11-20 DIAGNOSIS — Z7984 Long term (current) use of oral hypoglycemic drugs: Secondary | ICD-10-CM | POA: Insufficient documentation

## 2018-11-20 DIAGNOSIS — Z79890 Hormone replacement therapy (postmenopausal): Secondary | ICD-10-CM | POA: Diagnosis not present

## 2018-11-20 DIAGNOSIS — K7460 Unspecified cirrhosis of liver: Secondary | ICD-10-CM | POA: Diagnosis not present

## 2018-11-20 DIAGNOSIS — Z923 Personal history of irradiation: Secondary | ICD-10-CM

## 2018-11-20 DIAGNOSIS — L439 Lichen planus, unspecified: Secondary | ICD-10-CM | POA: Diagnosis not present

## 2018-11-20 DIAGNOSIS — I10 Essential (primary) hypertension: Secondary | ICD-10-CM | POA: Diagnosis not present

## 2018-11-20 HISTORY — PX: IR US GUIDE VASC ACCESS LEFT: IMG2389

## 2018-11-20 HISTORY — DX: Personal history of irradiation: Z92.3

## 2018-11-20 HISTORY — PX: IR EMBO TUMOR ORGAN ISCHEMIA INFARCT INC GUIDE ROADMAPPING: IMG5449

## 2018-11-20 HISTORY — PX: IR ANGIOGRAM SELECTIVE EACH ADDITIONAL VESSEL: IMG667

## 2018-11-20 HISTORY — PX: IR ANGIOGRAM VISCERAL SELECTIVE: IMG657

## 2018-11-20 LAB — COMPREHENSIVE METABOLIC PANEL
ALT: 20 U/L (ref 0–44)
AST: 27 U/L (ref 15–41)
Albumin: 4.2 g/dL (ref 3.5–5.0)
Alkaline Phosphatase: 67 U/L (ref 38–126)
Anion gap: 10 (ref 5–15)
BUN: 15 mg/dL (ref 8–23)
CO2: 23 mmol/L (ref 22–32)
Calcium: 9.4 mg/dL (ref 8.9–10.3)
Chloride: 101 mmol/L (ref 98–111)
Creatinine, Ser: 0.68 mg/dL (ref 0.44–1.00)
GFR calc Af Amer: >60 mL/min (ref >60)
GFR calc non Af Amer: >60 mL/min (ref >60)
Glucose, Bld: 102 mg/dL — ABNORMAL HIGH (ref 70–99)
Potassium: 4.2 mmol/L (ref 3.5–5.1)
Sodium: 134 mmol/L — ABNORMAL LOW (ref 135–145)
Total Bilirubin: 1.1 mg/dL (ref 0.3–1.2)
Total Protein: 7.7 g/dL (ref 6.5–8.1)

## 2018-11-20 LAB — CBC WITH DIFFERENTIAL/PLATELET
Abs Immature Granulocytes: 0.03 10*3/uL (ref 0.00–0.07)
Basophils Absolute: 0 10*3/uL (ref 0.0–0.1)
Basophils Relative: 0 %
Eosinophils Absolute: 0.1 10*3/uL (ref 0.0–0.5)
Eosinophils Relative: 1 %
HCT: 40.8 % (ref 36.0–46.0)
Hemoglobin: 13.8 g/dL (ref 12.0–15.0)
Immature Granulocytes: 1 %
Lymphocytes Relative: 19 %
Lymphs Abs: 0.9 10*3/uL (ref 0.7–4.0)
MCH: 30.4 pg (ref 26.0–34.0)
MCHC: 33.8 g/dL (ref 30.0–36.0)
MCV: 89.9 fL (ref 80.0–100.0)
Monocytes Absolute: 0.4 10*3/uL (ref 0.1–1.0)
Monocytes Relative: 9 %
Neutro Abs: 3.4 10*3/uL (ref 1.7–7.7)
Neutrophils Relative %: 70 %
Platelets: 133 10*3/uL — ABNORMAL LOW (ref 150–400)
RBC: 4.54 MIL/uL (ref 3.87–5.11)
RDW: 12.9 % (ref 11.5–15.5)
WBC: 4.9 10*3/uL (ref 4.0–10.5)
nRBC: 0 % (ref 0.0–0.2)

## 2018-11-20 LAB — GLUCOSE, CAPILLARY: Glucose-Capillary: 108 mg/dL — ABNORMAL HIGH (ref 70–99)

## 2018-11-20 LAB — PROTIME-INR
INR: 1.1 (ref 0.8–1.2)
Prothrombin Time: 13.7 seconds (ref 11.4–15.2)

## 2018-11-20 LAB — APTT: aPTT: 31 seconds (ref 24–36)

## 2018-11-20 MED ORDER — VERAPAMIL HCL 2.5 MG/ML IV SOLN
INTRAVENOUS | Status: AC
  Filled 2018-11-20: qty 2

## 2018-11-20 MED ORDER — IOHEXOL 300 MG/ML  SOLN
100.0000 mL | Freq: Once | INTRAMUSCULAR | Status: AC | PRN
  Administered 2018-11-20: 11:00:00 55 mL via INTRA_ARTERIAL

## 2018-11-20 MED ORDER — PIPERACILLIN-TAZOBACTAM 3.375 G IVPB
INTRAVENOUS | Status: AC
  Administered 2018-11-20: 3.375 g
  Filled 2018-11-20: qty 50

## 2018-11-20 MED ORDER — LIDOCAINE HCL 1 % IJ SOLN
INTRAMUSCULAR | Status: AC
  Filled 2018-11-20: qty 20

## 2018-11-20 MED ORDER — FENTANYL CITRATE (PF) 100 MCG/2ML IJ SOLN
INTRAMUSCULAR | Status: AC
  Filled 2018-11-20: qty 2

## 2018-11-20 MED ORDER — DEXAMETHASONE SODIUM PHOSPHATE 10 MG/ML IJ SOLN
8.0000 mg | Freq: Once | INTRAMUSCULAR | Status: AC
  Administered 2018-11-20: 8 mg via INTRAVENOUS

## 2018-11-20 MED ORDER — HEPARIN SODIUM (PORCINE) 1000 UNIT/ML IJ SOLN
INTRAMUSCULAR | Status: AC
  Filled 2018-11-20: qty 1

## 2018-11-20 MED ORDER — MIDAZOLAM HCL 2 MG/2ML IJ SOLN
INTRAMUSCULAR | Status: AC
  Filled 2018-11-20: qty 2

## 2018-11-20 MED ORDER — PIPERACILLIN SOD-TAZOBACTAM SO 2.25 (2-0.25) G IV SOLR
3.3750 g | Freq: Once | INTRAVENOUS | Status: AC
  Filled 2018-11-20: qty 15

## 2018-11-20 MED ORDER — LIDOCAINE HCL (PF) 1 % IJ SOLN
INTRAMUSCULAR | Status: AC | PRN
  Administered 2018-11-20: 2 mL

## 2018-11-20 MED ORDER — FENTANYL CITRATE (PF) 100 MCG/2ML IJ SOLN
INTRAMUSCULAR | Status: AC | PRN
  Administered 2018-11-20 (×4): 25 ug via INTRAVENOUS

## 2018-11-20 MED ORDER — DEXAMETHASONE SODIUM PHOSPHATE 10 MG/ML IJ SOLN
INTRAMUSCULAR | Status: AC
  Administered 2018-11-20: 8 mg via INTRAVENOUS
  Filled 2018-11-20: qty 1

## 2018-11-20 MED ORDER — NITROGLYCERIN IN D5W 100-5 MCG/ML-% IV SOLN
INTRAVENOUS | Status: AC
  Filled 2018-11-20: qty 250

## 2018-11-20 MED ORDER — SODIUM CHLORIDE 0.9 % IV SOLN
INTRAVENOUS | Status: DC
  Administered 2018-11-20: 09:00:00 via INTRAVENOUS

## 2018-11-20 MED ORDER — MIDAZOLAM HCL 2 MG/2ML IJ SOLN
INTRAMUSCULAR | Status: AC | PRN
  Administered 2018-11-20 (×4): 0.5 mg via INTRAVENOUS

## 2018-11-20 MED ORDER — VERAPAMIL HCL 2.5 MG/ML IV SOLN
INTRA_ARTERIAL | Status: AC | PRN
  Administered 2018-11-20 (×2): via INTRA_ARTERIAL

## 2018-11-20 MED ORDER — PANTOPRAZOLE SODIUM 40 MG IV SOLR
40.0000 mg | Freq: Once | INTRAVENOUS | Status: AC
  Administered 2018-11-20: 40 mg via INTRAVENOUS
  Filled 2018-11-20 (×2): qty 40

## 2018-11-20 MED ORDER — YTTRIUM 90 INJECTION: 9.5700 | INJECTION | Freq: Once | INTRAVENOUS | Status: DC | PRN

## 2018-11-20 MED ORDER — SODIUM CHLORIDE 0.9 % IV SOLN
8.0000 mg | Freq: Once | INTRAVENOUS | Status: AC
  Administered 2018-11-20: 8 mg via INTRAVENOUS
  Filled 2018-11-20 (×3): qty 4

## 2018-11-20 NOTE — Discharge Instructions (Signed)
Moderate Conscious Sedation, Adult, Care After These instructions provide you with information about caring for yourself after your procedure. Your health care provider may also give you more specific instructions. Your treatment has been planned according to current medical practices, but problems sometimes occur. Call your health care provider if you have any problems or questions after your procedure. What can I expect after the procedure? After your procedure, it is common:  To feel sleepy for several hours.  To feel clumsy and have poor balance for several hours.  To have poor judgment for several hours.  To vomit if you eat too soon. Follow these instructions at home: For at least 24 hours after the procedure:   Do not: ? Participate in activities where you could fall or become injured. ? Drive. ? Use heavy machinery. ? Drink alcohol. ? Take sleeping pills or medicines that cause drowsiness. ? Make important decisions or sign legal documents. ? Take care of children on your own.  Rest. Eating and drinking  Follow the diet recommended by your health care provider.  If you vomit: ? Drink water, juice, or soup when you can drink without vomiting. ? Make sure you have little or no nausea before eating solid foods. General instructions  Have a responsible adult stay with you until you are awake and alert.  Take over-the-counter and prescription medicines only as told by your health care provider.  If you smoke, do not smoke without supervision.  Keep all follow-up visits as told by your health care provider. This is important. Contact a health care provider if:  You keep feeling nauseous or you keep vomiting.  You feel light-headed.  You develop a rash.  You have a fever. Get help right away if:  You have trouble breathing. This information is not intended to replace advice given to you by your health care provider. Make sure you discuss any questions you have  with your health care provider. Document Released: 02/17/2013 Document Revised: 04/11/2017 Document Reviewed: 08/19/2015 Elsevier Patient Education  [place].   Hepatic Artery Radioembolization, Care After This sheet gives you information about how to care for yourself after your procedure. Your health care provider may also give you more specific instructions. If you have problems or questions, contact your health care provider. What can I expect after the procedure? After the procedure, it is common to have:  A slight fever for 1-2 weeks. If your fever gets worse, tell your health care provider.  Fatigue.  Loss of appetite. This should gradually improve after about 1 week.  Abdominal pain on your right side.  Soreness and tenderness in your groin area where the needle and catheter were placed (puncture site). Follow these instructions at home:  Puncture site care  Follow instructions from your health care provider about how to take care of the puncture site. Make sure you: ? Wash your hands with soap and water before you change your bandage (dressing). If soap and water are not available, use hand sanitizer. ? Change your dressing as told by your health care provider. ? Leave stitches (sutures), skin glue, or adhesive strips in place. These skin closures may need to stay in place for 2 weeks or longer. If adhesive strip edges start to loosen and curl up, you may trim the loose edges. Do not remove adhesive strips completely unless your health care provider tells you to do that.  Check your puncture site every day for signs of infection. Check for: ? More  redness, swelling, or pain. ? More fluid or blood. ? Warmth. ? Pus or a bad smell. Activity  Rest and return to your normal activities as told by your health care provider. Ask your health care provider what activities are safe for you.  Do not drive for 24 hours after the procedure if you were given a medicine to  help you relax (sedative).  Do not lift anything that is heavier than 10 lb (4.5 kg) until your health care provider says that it is safe. Medicines  Take over-the-counter and prescription medicines only as told by your health care provider.  Do not drive or use heavy machinery while taking prescription pain medicine. Radiation precautions  For up to a week after your procedure, there will be a small amount of radioactivity near your liver. This is not especially dangerous to other people. However, you should follow these precautions for 7 days: ? Do not come in close contact with people. ? Do not sleep in the same bed as someone else. ? Do not hold children or babies. ? Do not have contact with pregnant women. General instructions  To prevent or treat constipation while you are taking prescription pain medicine, your health care provider may recommend that you: ? Drink enough fluid to keep your urine clear or pale yellow. ? Take over-the-counter or prescription medicines. ? Eat foods that are high in fiber, such as fresh fruits and vegetables, whole grains, and beans. ? Limit foods that are high in fat and processed sugars, such as fried and sweet foods.  Eat frequent small meals until your appetite returns. Follow instructions from your health care provider about eating or drinking restrictions.  Do not take baths, swim, or use a hot tub until your health care provider approves. You may take showers. Wash your puncture site with mild soap and water and pat the area dry.  Wear compression stockings as told by your health care provider. These stockings help to prevent blood clots and reduce swelling in your legs.  Keep all follow-up visits as told by your health care provider. This is important. You may need to have blood tests and imaging tests done. Contact a health care provider if:  You have more redness, swelling, or pain around your puncture site.  You have more fluid or blood  coming from your puncture site.  Your puncture site feels warm to the touch.  You have pus or a bad smell coming from your puncture site.  You have pain that: ? Gets worse. ? Does not get better with medicine. ? Feels like very bad heartburn. ? Is in the middle of your abdomen, above your belly button.  Your skin and the white parts of your eyes turn yellow (jaundice).  The color of your urine changes to dark brown.  The color of your stool changes to light yellow.  Your abdominal measurement (girth) increases in a short period of time.  You gain more than 5 lb (2.3 kg) in a short period of time. Get help right away if:  You have a fever that lasts longer than 2 weeks or is higher than what your health care provider told you to expect.  You develop any of the following in your legs: ? Pain. ? Swelling. ? Skin that is cold or pale or turns blue.  You have chest pain.  You have blood in your vomit, saliva, or stool.  You have trouble breathing. This information is not intended to replace advice  given to you by your health care provider. Make sure you discuss any questions you have with your health care provider. Document Released: 05/04/2013 Document Revised: 04/11/2017 Document Reviewed: 01/27/2016 Elsevier Patient Education  [place] Radioembolization Discharge Instructions  You have been given a radioactive material during your procedure.  While it is safe for you to be discharged home from the hospital, you need to proceed directly home.    Do not use public transportation, including air travel, lasting more than 2 hours for 1 week.  Avoid crowded public places for 1 week.  Adult visitors should try to avoid close contact with you for 1 week.    Children and pregnant females should not visit or have close contact with you for 1 week.  Items that you touch are not radioactive.  Do not sleep in the same bed as your partner for 1 week, and a  condom should be used for sexual activity during the first 24 hours.  Your blood may be radioactive and caution should be used if any bleeding occurs during the recovery period.  Body fluids may be radioactive for 24 hours.  Wash your hands after voiding.  Men should sit to urinate.  Dispose of any soiled materials (flush down toilet or place in trash at home) during the first day.  Drink 6 to 8 glasses of fluids per day for 5 days to hydrate yourself.  If you need to see a doctor during the first week, you must let them know that you were treated with yttrium-90 microspheres, and will be slightly radioactive.  They can call Interventional Radiology [phone] with any questions.

## 2018-11-20 NOTE — H&P (Signed)
Referring Physician(s): [place]  Supervising Physician: Jacqulynn Cadet  Patient Status:  [facility]  Chief Complaint:  Hepatocellular carcinoma  Subjective: Patient familiar to IR service from paracenteses on 02/02/2018 and 04/20/2018 as well as pre-Y 90 hepatic/visceral arteriogram with embolization on 11/03/2018.  She has known history of NASH cirrhosis, splenomegaly, ascites, esophageal varices, portal hypertension and 1.7 cm segment 7 liver lesion consistent with [place].  She presents today for hepatic arteriogram with Y 90 radioembolization.  She currently denies fever, headache, chest pain, dyspnea, cough, back pain, nausea, vomiting or bleeding.  She does have some abdominal distention/bloating.  Past Medical History:  Diagnosis Date   ALLERGIC RHINITIS 01/12/2008   Allergy    Cataract    bilateral   Colon polyps    hyperplastic   Diabetes ([place])    Esophageal varices ([place])    Fatty liver    GOITER, MULTINODULAR 01/12/2008   Hepatic cirrhosis ([place])    Hyperlipidemia    Hypertension    HYPOTHYROIDISM 06/22/4707   Lichen planus    In the mouth, Notes that she's had this for at least 20 years   Liver cirrhosis secondary to NASH ([place])    Osteoporosis    Portal venous hypertension ([place])    Splenomegaly    Thrombocytopenia ([place])    Appears chronic from at least 2014   Vitamin D deficiency    Past Surgical History:  Procedure Laterality Date   arthroscopic left knee  2005   BREAST BIOPSY Left 2017   BREAST EXCISIONAL BIOPSY Right 2008   BREAST SURGERY  2008   Breast Biopsy    CHOLECYSTECTOMY  1985   COLONOSCOPY     IR ANGIOGRAM SELECTIVE EACH ADDITIONAL VESSEL  11/03/2018   IR ANGIOGRAM SELECTIVE EACH ADDITIONAL VESSEL  11/03/2018   IR ANGIOGRAM SELECTIVE EACH ADDITIONAL VESSEL  11/03/2018   IR ANGIOGRAM SELECTIVE EACH ADDITIONAL VESSEL  11/03/2018   IR ANGIOGRAM VISCERAL SELECTIVE  11/03/2018   IR ANGIOGRAM VISCERAL SELECTIVE  11/03/2018   IR  ANGIOGRAM VISCERAL SELECTIVE  11/03/2018   IR GENERIC HISTORICAL  04/02/2016   IR RADIOLOGIST EVAL & MGMT 04/02/2016 Jacqulynn Cadet, MD [facility] INTERV RAD   IR PARACENTESIS  02/02/2018   IR PARACENTESIS  04/20/2018   IR RADIOLOGIST EVAL & MGMT  10/29/2016   IR RADIOLOGIST EVAL & MGMT  04/29/2017   IR RADIOLOGIST EVAL & MGMT  11/04/2017   IR RADIOLOGIST EVAL & MGMT  04/16/2018   IR US GUIDE VASC ACCESS RIGHT  11/03/2018   POLYPECTOMY     UPPER GASTROINTESTINAL ENDOSCOPY         Allergies: Patient has no known allergies.  Medications: Prior to Admission medications   Medication Sig Start Date End Date Taking? Authorizing Provider  alendronate (FOSAMAX) 70 MG tablet Take 70 mg by mouth once a week. On Saturday 03/24/17   [provider]  Blood Glucose Monitoring Suppl ([place]) w/Device KIT OneTouch Verio System  USE TO TEST ONCE D UTD    [provider]  Cholecalciferol (VITAMIN D) 50 MCG (2000 UT) CAPS Take 2,000 Units by mouth at bedtime.    [provider]  fluticasone (FLONASE) 50 MCG/ACT nasal spray Place 1 spray into both nostrils daily.     [provider]  furosemide (LASIX) 40 MG tablet TAKE 1 TABLET (40 MG TOTAL) BY MOUTH DAILY. 10/30/18   Irene Shipper, MD  Lactobacillus (PROBIOTIC ACIDOPHILUS) CAPS Take 1 capsule by mouth 2 (two) times a  week.     [provider]  levothyroxine (SYNTHROID) 50 MCG tablet TAKE 1 TABLET EVERY DAY 09/02/18   Renato Shin, MD  metFORMIN (GLUCOPHAGE) 500 MG tablet Take 500 mg by mouth every evening. Take 1 tablet by mouth once daily     [provider]  montelukast (SINGULAIR) 10 MG tablet Take 10 mg by mouth at bedtime.  03/25/14   [provider]  Multiple Vitamin (MULTIVITAMIN) tablet Take 1 tablet by mouth at bedtime.     [provider]  spironolactone (ALDACTONE) 100 MG tablet TAKE 1 TABLET (100 MG TOTAL) BY MOUTH DAILY. 10/30/18   Irene Shipper,  MD     Vital Signs: BP (!) 148/74    Pulse 78    Temp 98 F (36.7 C) (Oral)    Resp 16   Physical Exam awake, alert.  Chest clear to auscultation bilaterally.  Heart with regular rate and rhythm.  Abdomen soft, slightly distended, positive bowel sounds, currently nontender.  Trace- 1+ pretibial edema bilaterally.  Imaging: No results found.  Labs:  CBC: Recent Labs    04/14/18 1239 06/02/18 0950 07/23/18 1045 11/03/18 0829  WBC 4.2 4.2 4.2 4.7  HGB 12.5 13.5 14.2 13.5  HCT 38.5 40.8 44.7 40.6  PLT 135* 117* 123* 126*    COAGS: Recent Labs    04/14/18 1239 07/23/18 1045 11/03/18 0829  INR 1.03 1.0 1.1  APTT  --   --  28    BMP: Recent Labs    04/14/18 1239  06/02/18 0950  07/23/18 1045  09/04/18 1026 10/06/18 1014 11/03/18 0829 11/17/18 1004  NA 138   < > 136   < > 138   < > 138 137 137 137  K 4.3   < > 4.6   < > 4.2   < > 4.3 4.5 4.2 4.4  CL 104   < > 101   < > 104   < > 100 104 103 103  CO2 26   < > 25   < > 24   < > 31 25 23 26   GLUCOSE 124*   < > 99   < > 97   < > 98 101* 106* 93  BUN 12   < > 12   < > 15   < > 17 11 11 14   CALCIUM 9.0   < > 9.1   < > 9.1   < > 9.1 9.0 9.2 9.0  CREATININE 0.71   < > 0.76   < > 0.67   < > 0.79 0.72 0.69 0.69  GFRNONAA >60  --  >60  --  >60  --   --   --  >60  --   GFRAA >60  --  >60  --  >60  --   --   --  >60  --    < > = values in this interval not displayed.    LIVER FUNCTION TESTS: Recent Labs    12/02/17 0856 04/14/18 1239 06/02/18 0950 07/23/18 1045  BILITOT 0.6 0.6 0.7 0.9  AST 29 21 34 30  ALT 19 13 27 20   ALKPHOS 70 79 80 72  PROT 6.8 6.6 7.0 7.1  ALBUMIN 3.7 3.4* 3.8 4.0    Assessment and Plan: Pt with history of NASH cirrhosis, splenomegaly, ascites, esophageal varices, portal hypertension and 1.7 cm segment 7 liver lesion consistent with HCC.  She has been seen in consultation by Dr. Laurence Ferrari and underwent  pre-Y 90 hepatic/visceral arterial roadmapping/embolization on 11/02/2018.  She  presents today for hepatic arteriogram with Y 90 radioembolization. Risks and benefits of procedure were discussed with the patient including, but not limited to bleeding, infection, vascular injury or contrast induced renal failure.  This interventional procedure involves the use of X-rays and because of the nature of the planned procedure, it is possible that we will have prolonged use of X-ray fluoroscopy.  Potential radiation risks to you include (but are not limited to) the following: - A slightly elevated risk for cancer  several years later in life. This risk is typically less than 0.5% percent. This risk is low in comparison to the normal incidence of human cancer, which is 33% for women and 50% for men according to the [place]. - Radiation induced injury can include skin redness, resembling a rash, tissue breakdown / ulcers and hair loss (which can be temporary or permanent).   The likelihood of either of these occurring depends on the difficulty of the procedure and whether you are sensitive to radiation due to previous procedures, disease, or genetic conditions.   IF your procedure requires a prolonged use of radiation, you will be notified and given written instructions for further action.  It is your responsibility to monitor the irradiated area for the 2 weeks following the procedure and to notify your physician if you are concerned that you have suffered a radiation induced injury.    All of the patient's questions were answered, patient is agreeable to proceed.  Consent signed and in chart.  LABS PENDING    Electronically Signed: D. Rowe Robert, PA-C 11/20/2018, 8:35 AM   I spent a total of 25 minutes at the the patient's bedside AND on the patient's hospital floor or unit, greater than 50% of which was counseling/coordinating care for hepatic/visceral arteriogram with Y 90 radioembolization

## 2018-11-20 NOTE — Sedation Documentation (Signed)
TR band applied by Dr Leonie Man with [facility] air.

## 2018-11-20 NOTE — Procedures (Signed)
Interventional Radiology Procedure Note  Procedure: TARE, segment 8 segmentectomy with N79  Complications: None  Estimated Blood Loss: None  Recommendations: - DC home - F/U in 2 weeks  Signed,  Criselda Peaches, MD

## 2018-11-21 LAB — AFP TUMOR MARKER: AFP, Serum, Tumor Marker: 4.2 ng/mL (ref 0.0–8.3)

## 2018-11-21 LAB — CEA: CEA: 1.9 ng/mL (ref 0.0–4.7)

## 2018-11-23 ENCOUNTER — Encounter: Payer: Self-pay | Admitting: General Surgery

## 2018-11-23 NOTE — Progress Notes (Signed)
Cc: Constipation, swelling in belly

## 2018-11-24 ENCOUNTER — Encounter: Payer: Self-pay | Admitting: Internal Medicine

## 2018-11-24 ENCOUNTER — Ambulatory Visit (INDEPENDENT_AMBULATORY_CARE_PROVIDER_SITE_OTHER): Payer: Medicare Other | Admitting: Internal Medicine

## 2018-11-24 VITALS — Ht 64.25 in | Wt 191.0 lb

## 2018-11-24 DIAGNOSIS — K7581 Nonalcoholic steatohepatitis (NASH): Secondary | ICD-10-CM

## 2018-11-24 DIAGNOSIS — K7460 Unspecified cirrhosis of liver: Secondary | ICD-10-CM | POA: Diagnosis not present

## 2018-11-24 DIAGNOSIS — R932 Abnormal findings on diagnostic imaging of liver and biliary tract: Secondary | ICD-10-CM

## 2018-11-24 DIAGNOSIS — R188 Other ascites: Secondary | ICD-10-CM

## 2018-11-24 NOTE — Progress Notes (Signed)
HISTORY OF PRESENT ILLNESS:  Crystal Barnett is a 78 y.o. female with multiple medical problems who has been followed in this office for NASH cirrhosis complicated by ascites requiring repeat paracentesis.  She is also followed by hematology/oncology for thrombocytopenia and the indeterminate liver lesion which is felt to represent either dysplastic nodule or small HCC.  I last saw the patient April 27, 2018.  At that dictation for details.  At that time she had recently undergone large-volume paracentesis and adjustments in her diuretics with significant weight reduction.  She has continued on Lasix 40 mg daily and Aldactone 100 mg daily as well as sodium restriction.  Periodic blood work has been fine.  She was to follow-up in 3 months, but follows up at this time.  Review of relevant x-rays since that time include CT scan March 2020.  She was noted to have ascites.  Her last upper endoscopy September 2019 revealed portal gastropathy but no varices.  She tells me that her weight has been fluctuating between 187 to 191 pounds.  She was 195 pounds at the time of her last visit.  She still feels like her abdomen is protuberant.  She is raised the question of a possible umbilical hernia.  No significant discomfort.  She does have alternating bowel habits which are nicely treated with Benefiber supplementation.  Review of relevant laboratories from November 20, 2018 shows INR 1.1, normal liver test, bilirubin 1.1, sodium 134, and normal BUN and creatinine (0.68).  Also normal AFP and CEA.  At that time she did undergo radioembolization of her liver lesion November 20, 2018 with Dr. Laurence Ferrari.  Patient tolerated that well without incident.  She denies GI bleeding or difficulties with mentation.  REVIEW OF SYSTEMS:  All non-GI ROS negative unless otherwise stated in the HPI except for allergies  Past Medical History:  Diagnosis Date  . ALLERGIC RHINITIS 01/12/2008  . Allergy   . Cataract    bilateral  . Colon  polyps    hyperplastic  . Diabetes ([place])   . Esophageal varices ([place])   . Fatty liver   . GOITER, MULTINODULAR 01/12/2008  . Hepatic cirrhosis ([place])   . Hyperlipidemia   . Hypertension   . HYPOTHYROIDISM 09/07/2008  . 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 Liver cirrhosis secondary to NASH ([place])   . Osteoporosis   . 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 Splenomegaly   . Thrombocytopenia ([place])    Appears chronic from at least 2014  . Vitamin D deficiency     Past Surgical History:  Procedure Laterality Date  . arthroscopic left knee  2005  . BREAST BIOPSY Left 2017  . BREAST EXCISIONAL BIOPSY Right 2008  . BREAST SURGERY  2008   Breast Biopsy   . CHOLECYSTECTOMY  1985  . COLONOSCOPY    . IR ANGIOGRAM SELECTIVE EACH ADDITIONAL VESSEL  11/03/2018  . IR ANGIOGRAM SELECTIVE EACH ADDITIONAL VESSEL  11/03/2018  . IR ANGIOGRAM SELECTIVE EACH ADDITIONAL VESSEL  11/03/2018  . IR ANGIOGRAM SELECTIVE EACH ADDITIONAL VESSEL  11/03/2018  . IR ANGIOGRAM SELECTIVE EACH ADDITIONAL VESSEL  11/20/2018  . IR ANGIOGRAM SELECTIVE EACH ADDITIONAL VESSEL  11/20/2018  . IR ANGIOGRAM SELECTIVE EACH ADDITIONAL VESSEL  11/20/2018  . IR ANGIOGRAM VISCERAL SELECTIVE  11/03/2018  . IR ANGIOGRAM VISCERAL SELECTIVE  11/03/2018  . IR ANGIOGRAM VISCERAL SELECTIVE  11/03/2018  . IR ANGIOGRAM VISCERAL SELECTIVE  11/20/2018  .  IR ANGIOGRAM VISCERAL SELECTIVE  11/20/2018  . IR EMBO TUMOR ORGAN ISCHEMIA INFARCT INC GUIDE ROADMAPPING  11/20/2018  . IR GENERIC HISTORICAL  04/02/2016   IR RADIOLOGIST EVAL & MGMT 04/02/2016 Jacqulynn Cadet, MD [facility] INTERV RAD  . IR PARACENTESIS  02/02/2018  . IR PARACENTESIS  04/20/2018  . IR RADIOLOGIST EVAL & MGMT  10/29/2016  . IR RADIOLOGIST EVAL & MGMT  04/29/2017  . IR RADIOLOGIST EVAL & MGMT  11/04/2017  . IR RADIOLOGIST EVAL & MGMT  04/16/2018  . IR US GUIDE VASC ACCESS LEFT  11/20/2018  . IR US GUIDE VASC ACCESS RIGHT  11/03/2018  .  POLYPECTOMY    . UPPER GASTROINTESTINAL ENDOSCOPY      Social History Crystal Barnett  reports that she quit smoking about 28 years ago. She has never used smokeless tobacco. She reports that she does not drink alcohol or use drugs.  family history includes Breast cancer in her mother; Goiter in her maternal grandmother; Stroke (age of onset: 4) in her father.  No Known Allergies     PHYSICAL EXAMINATION: No physical exam with telehealth medicine visit  ASSESSMENT:  1.  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  Stable since her visit 8 months ago but she may have significant residual ascites by history.  Favorable laboratories.  Current MELD score 6 2.  Indeterminate liver lesion with slight interval growth.  Normal AFP.  Status post recent radioembolization November 20, 2018 3.  Ascites.  Somewhat symptomatic 4.  Question umbilical hernia 5.  Portal hypertensive gastropathy on EGD September 2019   PLAN:  1.  SCHEDULE ULTRASOUND guided PARACENTESIS next week.  Remove up to 8 L. 2.  SEND fluid for cell count with differential 3.  IV ALBUMIN REPLACEMENT 8 g for every liter removed 4.  IN PERSON office visit with Dr. Henrene Pastor next month. 5.  Will be due for surveillance EGD around that time 6.  Continue Lasix 40 mg daily and Aldactone 100 mg daily 7.  Please refill diuretics 8.  Ongoing management of liver lesion per oncology and IR This telehealth medicine visit was initiated by consented for by the patient who was in her home while I was in my office.  She understands her may be an associated professional charge for this service which totaled 30 minutes in duration

## 2018-11-25 MED ORDER — FUROSEMIDE 40 MG PO TABS: 40.0000 mg | ORAL_TABLET | Freq: Every day | ORAL | 1 refills | Status: DC

## 2018-11-25 MED ORDER — SPIRONOLACTONE 100 MG PO TABS: 100.0000 mg | ORAL_TABLET | Freq: Every day | ORAL | 1 refills | Status: DC

## 2018-11-25 NOTE — Patient Instructions (Signed)
  1.  You have been scheduled for an abdominal paracentesis at [facility] radiology (1st floor of hospital) on 12/03/2018 at 9:00am. Please arrive at least 15 minutes prior to your appointment time for registration. Should you need to reschedule this appointment for any reason, please call our office at [phone].  Make sure you wear a mask and do not bring anyone with you.  2.  Please follow up with Dr. Henrene Pastor on 01/04/19 at 1:30pm  3.  Will be due for surveillance EGD around that time  4.  Continue Lasix 40 mg daily and Aldactone 100 mg daily  We have sent the following medications to your pharmacy for you to pick up at your convenience: Lasix and Aldactone  5.  Ongoing management of liver lesion per oncology and IR

## 2018-11-25 NOTE — Addendum Note (Signed)
Addended by: Julieanne Cotton K on: 11/25/2018 11:35 AM   Modules accepted: Orders

## 2018-11-26 ENCOUNTER — Other Ambulatory Visit: Payer: Self-pay | Admitting: Interventional Radiology

## 2018-11-26 DIAGNOSIS — K769 Liver disease, unspecified: Secondary | ICD-10-CM

## 2018-11-26 DIAGNOSIS — C220 Liver cell carcinoma: Secondary | ICD-10-CM

## 2018-11-27 ENCOUNTER — Other Ambulatory Visit: Payer: Medicare Other

## 2018-12-01 ENCOUNTER — Telehealth: Payer: Self-pay | Admitting: Hematology

## 2018-12-01 ENCOUNTER — Other Ambulatory Visit: Payer: Self-pay

## 2018-12-01 ENCOUNTER — Inpatient Hospital Stay (HOSPITAL_BASED_OUTPATIENT_CLINIC_OR_DEPARTMENT_OTHER): Payer: Medicare Other | Admitting: Hematology

## 2018-12-01 ENCOUNTER — Inpatient Hospital Stay: Payer: Medicare Other | Attending: Hematology

## 2018-12-01 VITALS — BP 127/72 | HR 75 | Temp 98.7°F | Resp 18 | Ht 64.25 in | Wt 182.9 lb

## 2018-12-01 DIAGNOSIS — E039 Hypothyroidism, unspecified: Secondary | ICD-10-CM | POA: Insufficient documentation

## 2018-12-01 DIAGNOSIS — C220 Liver cell carcinoma: Secondary | ICD-10-CM

## 2018-12-01 DIAGNOSIS — Z86010 Personal history of colonic polyps: Secondary | ICD-10-CM | POA: Insufficient documentation

## 2018-12-01 DIAGNOSIS — K766 Portal hypertension: Secondary | ICD-10-CM | POA: Insufficient documentation

## 2018-12-01 DIAGNOSIS — R162 Hepatomegaly with splenomegaly, not elsewhere classified: Secondary | ICD-10-CM | POA: Insufficient documentation

## 2018-12-01 DIAGNOSIS — E559 Vitamin D deficiency, unspecified: Secondary | ICD-10-CM | POA: Insufficient documentation

## 2018-12-01 DIAGNOSIS — I10 Essential (primary) hypertension: Secondary | ICD-10-CM | POA: Insufficient documentation

## 2018-12-01 DIAGNOSIS — E119 Type 2 diabetes mellitus without complications: Secondary | ICD-10-CM | POA: Diagnosis not present

## 2018-12-01 DIAGNOSIS — K7581 Nonalcoholic steatohepatitis (NASH): Secondary | ICD-10-CM | POA: Insufficient documentation

## 2018-12-01 DIAGNOSIS — M810 Age-related osteoporosis without current pathological fracture: Secondary | ICD-10-CM

## 2018-12-01 DIAGNOSIS — E785 Hyperlipidemia, unspecified: Secondary | ICD-10-CM

## 2018-12-01 DIAGNOSIS — K7460 Unspecified cirrhosis of liver: Secondary | ICD-10-CM | POA: Insufficient documentation

## 2018-12-01 DIAGNOSIS — R634 Abnormal weight loss: Secondary | ICD-10-CM | POA: Insufficient documentation

## 2018-12-01 DIAGNOSIS — K769 Liver disease, unspecified: Secondary | ICD-10-CM

## 2018-12-01 DIAGNOSIS — D696 Thrombocytopenia, unspecified: Secondary | ICD-10-CM

## 2018-12-01 DIAGNOSIS — D731 Hypersplenism: Secondary | ICD-10-CM

## 2018-12-01 LAB — CBC WITH DIFFERENTIAL/PLATELET
Abs Immature Granulocytes: 0.02 10*3/uL (ref 0.00–0.07)
Basophils Absolute: 0 10*3/uL (ref 0.0–0.1)
Basophils Relative: 1 %
Eosinophils Absolute: 0.1 10*3/uL (ref 0.0–0.5)
Eosinophils Relative: 2 %
HCT: 40.2 % (ref 36.0–46.0)
Hemoglobin: 13.5 g/dL (ref 12.0–15.0)
Immature Granulocytes: 0 %
Lymphocytes Relative: 13 %
Lymphs Abs: 0.6 10*3/uL — ABNORMAL LOW (ref 0.7–4.0)
MCH: 29.5 pg (ref 26.0–34.0)
MCHC: 33.6 g/dL (ref 30.0–36.0)
MCV: 88 fL (ref 80.0–100.0)
Monocytes Absolute: 0.3 10*3/uL (ref 0.1–1.0)
Monocytes Relative: 7 %
Neutro Abs: 3.5 10*3/uL (ref 1.7–7.7)
Neutrophils Relative %: 77 %
Platelets: 111 10*3/uL — ABNORMAL LOW (ref 150–400)
RBC: 4.57 MIL/uL (ref 3.87–5.11)
RDW: 13 % (ref 11.5–15.5)
WBC: 4.6 10*3/uL (ref 4.0–10.5)
nRBC: 0 % (ref 0.0–0.2)

## 2018-12-01 LAB — CMP (CANCER CENTER ONLY)
ALT: 25 U/L (ref 0–44)
AST: 32 U/L (ref 15–41)
Albumin: 3.8 g/dL (ref 3.5–5.0)
Alkaline Phosphatase: 88 U/L (ref 38–126)
Anion gap: 9 (ref 5–15)
BUN: 14 mg/dL (ref 8–23)
CO2: 25 mmol/L (ref 22–32)
Calcium: 9.5 mg/dL (ref 8.9–10.3)
Chloride: 103 mmol/L (ref 98–111)
Creatinine: 0.79 mg/dL (ref 0.44–1.00)
GFR, Est AFR Am: >60 mL/min (ref >60)
GFR, Estimated: >60 mL/min (ref >60)
Glucose, Bld: 106 mg/dL — ABNORMAL HIGH (ref 70–99)
Potassium: 4.7 mmol/L (ref 3.5–5.1)
Sodium: 137 mmol/L (ref 135–145)
Total Bilirubin: 0.8 mg/dL (ref 0.3–1.2)
Total Protein: 7.1 g/dL (ref 6.5–8.1)

## 2018-12-01 NOTE — Telephone Encounter (Signed)
Scheduled appt per 7/21 los.  Printed and mailed appt calendar.

## 2018-12-01 NOTE — Progress Notes (Signed)
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 Laurence Ferrari)  Current Treatment: observation and further evaluation   INTERVAL HISTORY:   Crystal Barnett is here for follow-up regarding her thrombocytopenia and regarding her concerning liver lesion - likely low grade HCC. The patient's last visit with [place] was on 06/02/2018. The pt reports that she is doing well overall.  The pt reports that she has been measuring her bodyweight to determine fluid retention. She notes that she has been losing weight, which she notes a lot is due to fluid, but she also thinks she is losing general weight as well.   Of note since the patient's last visit, pt has had Y-90 radioembolization segmentectomy completed on 11/20/2018.  Lab results today (12/01/18) of CBC w/diff and CMP is as follows: all values are WNL except for platelets at 111, lymphs abs at 0.6K, glucose at 106.  On review of systems, pt reports weight loss and denies swelling and any other symptoms.      REVIEW OF SYSTEMS:   A 10+ POINT REVIEW OF SYSTEMS WAS OBTAINED including neurology, dermatology, psychiatry, cardiac, respiratory, lymph, extremities, GI, GU, Musculoskeletal, constitutional, breasts, reproductive, HEENT.  All pertinent positives are noted in the HPI.  All others are negative.    . Past Medical History:  Diagnosis Date  . ALLERGIC RHINITIS 01/12/2008  . Allergy   . Cataract    bilateral  . Colon polyps    hyperplastic  . Diabetes ([place])   . Esophageal varices ([place])   . Fatty liver   . GOITER, MULTINODULAR 01/12/2008  . Hepatic  cirrhosis ([place])   . Hyperlipidemia   . Hypertension   . HYPOTHYROIDISM 09/07/2008  . 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 Liver cirrhosis secondary to NASH ([place])   . Osteoporosis   . 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 Splenomegaly   . Thrombocytopenia ([place])    Appears chronic from at least 2014  . Vitamin D deficiency     . Past Surgical History:  Procedure Laterality Date  . arthroscopic left knee  2005  . BREAST BIOPSY Left 2017  . BREAST EXCISIONAL BIOPSY Right 2008  . BREAST SURGERY  2008   Breast Biopsy   . CHOLECYSTECTOMY  1985  . COLONOSCOPY    . IR ANGIOGRAM SELECTIVE EACH ADDITIONAL VESSEL  11/03/2018  . IR ANGIOGRAM SELECTIVE EACH ADDITIONAL VESSEL  11/03/2018  . IR ANGIOGRAM SELECTIVE EACH ADDITIONAL VESSEL  11/03/2018  . IR ANGIOGRAM SELECTIVE EACH ADDITIONAL VESSEL  11/03/2018  . IR ANGIOGRAM SELECTIVE EACH ADDITIONAL VESSEL  11/20/2018  . IR ANGIOGRAM SELECTIVE EACH ADDITIONAL VESSEL  11/20/2018  . IR ANGIOGRAM SELECTIVE EACH ADDITIONAL VESSEL  11/20/2018  . IR ANGIOGRAM VISCERAL SELECTIVE  11/03/2018  . IR ANGIOGRAM VISCERAL SELECTIVE  11/03/2018  . IR ANGIOGRAM VISCERAL SELECTIVE  11/03/2018  . IR ANGIOGRAM VISCERAL SELECTIVE  11/20/2018  . IR ANGIOGRAM VISCERAL SELECTIVE  11/20/2018  . IR EMBO TUMOR ORGAN ISCHEMIA INFARCT INC GUIDE ROADMAPPING  11/20/2018  . IR GENERIC HISTORICAL  04/02/2016   IR  RADIOLOGIST EVAL & MGMT 04/02/2016 Jacqulynn Cadet, MD [facility] INTERV RAD  . IR PARACENTESIS  02/02/2018  . IR PARACENTESIS  04/20/2018  . IR RADIOLOGIST EVAL & MGMT  10/29/2016  . IR RADIOLOGIST EVAL & MGMT  04/29/2017  . IR RADIOLOGIST EVAL & MGMT  11/04/2017  . IR RADIOLOGIST EVAL & MGMT  04/16/2018  . IR US GUIDE VASC ACCESS LEFT  11/20/2018  . IR US GUIDE VASC ACCESS RIGHT  11/03/2018  . POLYPECTOMY    . UPPER GASTROINTESTINAL ENDOSCOPY      Social History   Tobacco Use  . Smoking status: Former Smoker    Quit date:  05/13/1990    Years since quitting: 28.5  . Smokeless tobacco: Never Used  Substance Use Topics  . Alcohol use: No    Alcohol/week: 0.0 standard drinks  . Drug use: No    ALLERGIES:  has No Known Allergies.   MEDICATIONS:  Current Outpatient Medications  Medication Sig Dispense Refill  . alendronate (FOSAMAX) 70 MG tablet Take 70 mg by mouth once a week. On Saturday    . Blood Glucose Monitoring Suppl (ONETOUCH VERIO FLEX SYSTEM) w/Device KIT OneTouch Verio System  USE TO TEST ONCE D UTD    . Cholecalciferol (VITAMIN D) 50 MCG (2000 UT) CAPS Take 2,000 Units by mouth at bedtime.    . fluticasone (FLONASE) 50 MCG/ACT nasal spray Place 1 spray into both nostrils daily.     . furosemide (LASIX) 40 MG tablet Take 1 tablet (40 mg total) by mouth daily. 90 tablet 1  . Lactobacillus (PROBIOTIC ACIDOPHILUS) CAPS Take 1 capsule by mouth 2 (two) times a week.     . levothyroxine (SYNTHROID) 50 MCG tablet TAKE 1 TABLET EVERY DAY 90 tablet 3  . metFORMIN (GLUCOPHAGE) 500 MG tablet Take 500 mg by mouth every evening. Take 1 tablet by mouth once daily     . montelukast (SINGULAIR) 10 MG tablet Take 10 mg by mouth at bedtime.     . Multiple Vitamin (MULTIVITAMIN) tablet Take 1 tablet by mouth at bedtime.     Marland Kitchen spironolactone (ALDACTONE) 100 MG tablet Take 1 tablet (100 mg total) by mouth daily. 90 tablet 1   No current facility-administered medications for this visit.     PHYSICAL EXAMINATION: ECOG PERFORMANCE STATUS: 1 - Symptomatic but completely ambulatory  Vitals:   12/01/18 1039  BP: 127/72  Pulse: 75  Resp: 18  Temp: 98.7 F (37.1 C)  SpO2: 98%   Filed Weights   12/01/18 1039  Weight: 182 lb 14.4 oz (83 kg)   .Body mass index is 31.15 kg/m.  GENERAL:alert, in no acute distress and comfortable SKIN: no acute rashes, no significant lesions EYES: conjunctiva are pink and non-injected, sclera anicteric OROPHARYNX: MMM, no exudates, no oropharyngeal erythema or ulceration NECK:  supple, no JVD LYMPH:  no palpable lymphadenopathy in the cervical, axillary or inguinal regions LUNGS: clear to auscultation b/l with normal respiratory effort HEART: regular rate & rhythm ABDOMEN:  normoactive bowel sounds , non tender, not distended.   Extremity: no pedal edema PSYCH: alert & oriented x 3 with fluent speech NEURO: no focal motor/sensory deficits    LABORATORY DATA:  I have reviewed the data as listed  . CBC Latest Ref Rng & Units 12/04/2018 12/01/2018 11/20/2018  WBC 4.0 - 10.5 K/uL 4.0 4.6 4.9  Hemoglobin 12.0 - 15.0 g/dL 13.9 13.5 13.8  Hematocrit 36.0 - 46.0 % 42.0 40.2 40.8  Platelets 150 - 400 K/uL 107(L)  111(L) 133(L)    CMP Latest Ref Rng & Units 12/04/2018 12/01/2018 11/20/2018  Glucose 70 - 99 mg/dL 90 106(H) 102(H)  BUN 8 - 23 mg/dL _0 Creatinine 0.44 - 1.00 mg/dL 0.71 0.79 0.68  Sodium 135 - 145 mmol/L 136 137 134(L)  Potassium 3.5 - 5.1 mmol/L 4.4 4.7 4.2  Chloride 98 - 111 mmol/L 103 103 101  CO2 22 - 32 mmol/L _1 Calcium 8.9 - 10.3 mg/dL 9.2 9.5 9.4  Total Protein 6.5 - 8.1 g/dL 7.5 7.1 7.7  Total Bilirubin 0.3 - 1.2 mg/dL 1.1 0.8 1.1  Alkaline Phos 38 - 126 U/L 76 88 67  AST 15 - 41 U/L 33 32 27  ALT 0 - 44 U/L _2 AFP tumor marker 7/24- 4.8  RADIOLOGY  MRI Abdomen W WO Contrast 10/28/17  IMPRESSION: 1.2 cm hypervascular mass in the dome of the right hepatic lobe shows no significant change, however Imaging findings are diagnostic of hepatocellular carcinoma in the setting of cirrhosis. No evidence of abdominal metastatic disease. Hepatic cirrhosis, and mild splenomegaly consistent with portal venous hypertension. Moderate ascites, increased since prior study.  MRI abd w and wo contrast 04/23/17 stable appearance of segment 7 liver lesion. Highly suspicious for hepatocellular carcinoma. Cirrhosis, mild hepatic steatosis and splenomegaly.   MRI abd w and wo contrast 10/22/2016 CLINICAL DATA:  Followup of liver  lesions. Nonalcoholic steatohepatitis induced cirrhosis. Thrombocytopenia. EXAM: MRI ABDOMEN WITHOUT AND WITH CONTRAST TECHNIQUE: Multiplanar multisequence MR imaging of the abdomen was performed both before and after the administration of intravenous contrast. CONTRAST:  77m EOVIST GADOXETATE DISODIUM 0.25 MOL/L IV SOLN COMPARISON:  MRI of 02/27/2016.  Clinic note of 04/02/2016. FINDINGS: Motion degradation involves the arterial phase post-contrast images primarily. Lower chest: Normal heart size without pericardial or pleural effusion. Hepatobiliary: Moderate to marked cirrhosis. Segment 7 liver lesion has undergone mild interval enlargement. For example, on T2 weighted imaging, measures 12 x 10 mm (image 13/series 7) versus 10 x 9 mm on the prior. On post-contrast image 27/ series 502, measures 17 x 13 mm today versus 14 x 10 mm on the prior (when remeasured). Demonstrates arterial and portal venous phase hyper enhancement with relative isointensity on more delayed post-contrast imaging. Left hepatic lobe tiny lesion is likely a cyst and is similar. Mild hepatic steatosis. Cholecystectomy, without biliary ductal dilatation. Pancreas:  Normal, without mass or ductal dilatation. Spleen:  Mild splenomegaly, 14.3 cm craniocaudal. Adrenals/Urinary Tract: Normal adrenal glands. Normal kidneys, without hydronephrosis. Stomach/Bowel: Normal stomach and abdominal bowel loops. Vascular/Lymphatic: Patent hepatic and portal veins. No specific evidence of portal venous hypertension. Circumaortic left renal vein. No retroperitoneal or retrocrural adenopathy. Other:  Small volume perihepatic ascites is unchanged Musculoskeletal: No acute osseous abnormality. IMPRESSION: 1. Mild interval enlargement of a segment 7 liver lesion, highly suspicious for hepatocellular carcinoma. 2. Cirrhosis, mild hepatic steatosis, and mild splenomegaly. Electronically Signed   By: KAbigail MiyamotoM.D.   On:  10/22/2016 10:22    ASSESSMENT & PLAN:  78y.o. female with  #1 Chronic thrombocytopenia. Platelet counts stable at 107k Likely related to cirrhosis related to NASH with mild splenomegaly.  B12, TSH, LDH within normal limits. MRI abd shows mild splenomegaly at 14.5 cm  #2 liver cirrhosis likely related to NASH hepatitis profile neg, ferritin unrevealing. Recent EGD showed no evidence of varices. Subtle changes of portal hypertensive gastropathy.  #3 Lesion in the RIGHT hepatic lobe has imaging characteristics highly suspicious  for hepatic cellular carcinoma including arterial enhancement and a new peripheral rim.   04/23/17 MRI results do not indicate a significant change in the liver nodule   -Rpt MRI from 10/28/17 shows overall stable liver lesion at 1.2cm, mild splenomegaly, moderate ascites, liver cirrhosis.   04/16/18 MRI Abdomen which revealed There has been mild increase in size of hypervascular liver lesion within posterior dome. Enhancement characteristics are compatible with LR-5 (definitely [place]). No new lesions identified. 2. Morphologic features of liver compatible with cirrhosis with stigmata of portal venous hypertension.   PLAN  -Discussed pt labwork today, 12/01/18; all values are WNL except for platelets at 111, lymphs abs at 0.6K, glucose at 106. 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 -AFP tumor marker not significantly changed. -Follow up in 6 months  FOLLOW UP: RTC with Dr Irene Limbo with labs in 6 months     The total time spent in the appt was 15 minutes and more than 50% was on counseling and direct patient cares.     Sullivan Lone MD MS AAHIVMS [facility] Hematology/Oncology Physician [facility]  (Office):       [phone] (Work cell):  [phone] (Fax):           [phone]  I, Jacqualyn Posey, am acting as a scribe for Dr. Sullivan Lone.   .I have reviewed the  above documentation for accuracy and completeness, and I agree with the above. Brunetta Genera MD

## 2018-12-02 LAB — AFP TUMOR MARKER: AFP, Serum, Tumor Marker: 4.4 ng/mL (ref 0.0–8.3)

## 2018-12-03 ENCOUNTER — Other Ambulatory Visit: Payer: Self-pay

## 2018-12-03 ENCOUNTER — Encounter (HOSPITAL_COMMUNITY): Payer: Self-pay | Admitting: Student

## 2018-12-03 ENCOUNTER — Ambulatory Visit (HOSPITAL_COMMUNITY)
Admission: RE | Admit: 2018-12-03 | Discharge: 2018-12-03 | Disposition: A | Discharge status: Home / Self Care | Payer: Medicare Other | Source: Ambulatory Visit | Attending: Internal Medicine | Admitting: Internal Medicine

## 2018-12-03 DIAGNOSIS — K7581 Nonalcoholic steatohepatitis (NASH): Secondary | ICD-10-CM | POA: Insufficient documentation

## 2018-12-03 DIAGNOSIS — R932 Abnormal findings on diagnostic imaging of liver and biliary tract: Secondary | ICD-10-CM | POA: Diagnosis not present

## 2018-12-03 DIAGNOSIS — R8569 Abnormal cytological findings in specimens from other digestive organs and abdominal cavity: Secondary | ICD-10-CM | POA: Diagnosis not present

## 2018-12-03 DIAGNOSIS — R188 Other ascites: Secondary | ICD-10-CM | POA: Insufficient documentation

## 2018-12-03 DIAGNOSIS — K7460 Unspecified cirrhosis of liver: Secondary | ICD-10-CM | POA: Insufficient documentation

## 2018-12-03 HISTORY — PX: IR PARACENTESIS: IMG2679

## 2018-12-03 LAB — BODY FLUID CELL COUNT WITH DIFFERENTIAL
Eos, Fluid: 0 %
Lymphs, Fluid: 60 %
Monocyte-Macrophage-Serous Fluid: 35 % — ABNORMAL LOW (ref 50–90)
Neutrophil Count, Fluid: 5 % (ref 0–25)
Total Nucleated Cell Count, Fluid: 1242 cu mm — ABNORMAL HIGH (ref 0–1000)

## 2018-12-03 MED ORDER — LIDOCAINE HCL 1 % IJ SOLN
INTRAMUSCULAR | Status: DC | PRN
  Administered 2018-12-03: 10 mL

## 2018-12-03 MED ORDER — LIDOCAINE HCL 1 % IJ SOLN
INTRAMUSCULAR | Status: AC
  Filled 2018-12-03: qty 20

## 2018-12-03 NOTE — Procedures (Addendum)
PROCEDURE SUMMARY:  Successful US guided paracentesis from right lateral abdomen.  Yielded 100 mL of cloudy, pink fluid.  No immediate complications.  Pt tolerated well.   Specimen was sent for labs.  EBL < 47m  KDocia BarrierPA-C 12/03/2018 9:49 AM

## 2018-12-04 ENCOUNTER — Other Ambulatory Visit (HOSPITAL_COMMUNITY)
Admission: RE | Admit: 2018-12-04 | Discharge: 2018-12-04 | Disposition: A | Discharge status: Home / Self Care | Payer: Medicare Other | Source: Ambulatory Visit | Attending: Interventional Radiology | Admitting: Interventional Radiology

## 2018-12-04 DIAGNOSIS — C220 Liver cell carcinoma: Secondary | ICD-10-CM | POA: Insufficient documentation

## 2018-12-04 LAB — COMPREHENSIVE METABOLIC PANEL
ALT: 27 U/L (ref 0–44)
AST: 33 U/L (ref 15–41)
Albumin: 4.2 g/dL (ref 3.5–5.0)
Alkaline Phosphatase: 76 U/L (ref 38–126)
Anion gap: 7 (ref 5–15)
BUN: 17 mg/dL (ref 8–23)
CO2: 26 mmol/L (ref 22–32)
Calcium: 9.2 mg/dL (ref 8.9–10.3)
Chloride: 103 mmol/L (ref 98–111)
Creatinine, Ser: 0.71 mg/dL (ref 0.44–1.00)
GFR calc Af Amer: >60 mL/min (ref >60)
GFR calc non Af Amer: >60 mL/min (ref >60)
Glucose, Bld: 90 mg/dL (ref 70–99)
Potassium: 4.4 mmol/L (ref 3.5–5.1)
Sodium: 136 mmol/L (ref 135–145)
Total Bilirubin: 1.1 mg/dL (ref 0.3–1.2)
Total Protein: 7.5 g/dL (ref 6.5–8.1)

## 2018-12-04 LAB — CBC
HCT: 42 % (ref 36.0–46.0)
Hemoglobin: 13.9 g/dL (ref 12.0–15.0)
MCH: 30.2 pg (ref 26.0–34.0)
MCHC: 33.1 g/dL (ref 30.0–36.0)
MCV: 91.3 fL (ref 80.0–100.0)
Platelets: 107 10*3/uL — ABNORMAL LOW (ref 150–400)
RBC: 4.6 MIL/uL (ref 3.87–5.11)
RDW: 13.1 % (ref 11.5–15.5)
WBC: 4 10*3/uL (ref 4.0–10.5)
nRBC: 0 % (ref 0.0–0.2)

## 2018-12-04 LAB — PROTIME-INR
INR: 1.1 (ref 0.8–1.2)
Prothrombin Time: 13.8 seconds (ref 11.4–15.2)

## 2018-12-04 LAB — PATHOLOGIST SMEAR REVIEW

## 2018-12-05 LAB — AFP TUMOR MARKER: AFP, Serum, Tumor Marker: 4.8 ng/mL (ref 0.0–8.3)

## 2018-12-08 ENCOUNTER — Other Ambulatory Visit: Payer: Self-pay

## 2018-12-08 ENCOUNTER — Ambulatory Visit
Admission: RE | Admit: 2018-12-08 | Discharge: 2018-12-08 | Disposition: A | Discharge status: Home / Self Care | Payer: Medicare Other | Source: Ambulatory Visit | Attending: Interventional Radiology | Admitting: Interventional Radiology

## 2018-12-08 ENCOUNTER — Encounter: Payer: Self-pay | Admitting: *Deleted

## 2018-12-08 DIAGNOSIS — Z9889 Other specified postprocedural states: Secondary | ICD-10-CM | POA: Diagnosis not present

## 2018-12-08 DIAGNOSIS — C220 Liver cell carcinoma: Secondary | ICD-10-CM

## 2018-12-08 HISTORY — PX: IR RADIOLOGIST EVAL & MGMT: IMG5224

## 2018-12-08 NOTE — Progress Notes (Signed)
Chief Complaint: Patient was seen remotely today (TeleHealth) for hepatocellular cancer at the request of [place].    Referring Physician(s): Annete Ayuso  History of Present Illness: Crystal Barnett is a 78 y.o. 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 MRI imaging was performed first in December 2016 to evaluate a region of possible concern in the left hepatic lobe. On that study, there was a small 9 mm enhancing focus in hepatic segment 7 in the posterosuperior aspect of the hepatic dome. This subsequently been followed at 6 month intervals with repeat MRI scans of the abdomen.  MRI from 04/16/18 demonstrates more definitive enlargement of the lesion now measuring up to 1.9 cm. Additionally, there appears to be an enhancing pseudocapsule. On the present study, the imaging characteristics are consistent with a lie RADS 5 lesion which is diagnostic of hepatocellular carcinoma. The very slow growth rate over the past 3 years suggests a low-grade or indolent malignancy at this time.  Due to the presence of ascites and the location of the lesion high in the right dome just under the lung, we elected to proceed with transarterial radio embolization segmentectomy (8) which was performed on 11/17/2018.  I spoke with Crystal Barnett on the phone today.  She is doing well.  Her postprocedural course has been uncomplicated.  She had several days of fatigue but no other significant symptoms.  Her left radial access site is well-healed.  She reports that she can barely see it.  She denies chest pain, nausea, vomiting, abdominal pain, loss of appetite, shortness of breath or other systemic symptoms.  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   Liver cirrhosis secondary to NASH ([place])    Osteoporosis    Portal venous hypertension ([place])    Splenomegaly    Thrombocytopenia ([place])    Appears chronic from at least 2014   Vitamin D deficiency     Past Surgical History:  Procedure Laterality Date   arthroscopic left knee  2005   BREAST BIOPSY Left 2017   BREAST EXCISIONAL BIOPSY Right 2008   BREAST SURGERY  2008   Breast Biopsy    CHOLECYSTECTOMY  1985   COLONOSCOPY     IR ANGIOGRAM SELECTIVE EACH ADDITIONAL VESSEL  11/03/2018   IR ANGIOGRAM SELECTIVE EACH ADDITIONAL VESSEL  11/03/2018   IR ANGIOGRAM SELECTIVE EACH ADDITIONAL VESSEL  11/03/2018   IR ANGIOGRAM SELECTIVE EACH ADDITIONAL VESSEL  11/03/2018   IR ANGIOGRAM SELECTIVE EACH ADDITIONAL VESSEL  11/20/2018   IR ANGIOGRAM SELECTIVE EACH ADDITIONAL VESSEL  11/20/2018   IR ANGIOGRAM SELECTIVE EACH ADDITIONAL VESSEL  11/20/2018   IR ANGIOGRAM VISCERAL SELECTIVE  11/03/2018   IR ANGIOGRAM VISCERAL SELECTIVE  11/03/2018   IR ANGIOGRAM VISCERAL SELECTIVE  11/03/2018   IR ANGIOGRAM VISCERAL SELECTIVE  11/20/2018   IR ANGIOGRAM VISCERAL SELECTIVE  11/20/2018   IR EMBO TUMOR ORGAN ISCHEMIA INFARCT INC GUIDE ROADMAPPING  11/20/2018   IR GENERIC HISTORICAL  04/02/2016   IR RADIOLOGIST EVAL & MGMT 04/02/2016 Jacqulynn Cadet, MD  [facility] INTERV RAD   IR PARACENTESIS  02/02/2018   IR PARACENTESIS  04/20/2018   IR PARACENTESIS  12/03/2018   IR RADIOLOGIST EVAL & MGMT  10/29/2016   IR RADIOLOGIST EVAL & MGMT  04/29/2017   IR RADIOLOGIST EVAL & MGMT  11/04/2017   IR RADIOLOGIST EVAL & MGMT  04/16/2018   IR US GUIDE VASC ACCESS LEFT  11/20/2018   IR US GUIDE VASC ACCESS RIGHT  11/03/2018   POLYPECTOMY     UPPER GASTROINTESTINAL ENDOSCOPY      Allergies: Patient has no known  allergies.  Medications: Prior to Admission medications   Medication Sig Start Date End Date Taking? Authorizing Provider  alendronate (FOSAMAX) 70 MG tablet Take 70 mg by mouth once a week. On Saturday 03/24/17   [provider]  Blood Glucose Monitoring Suppl ([place]) w/Device KIT OneTouch Verio System  USE TO TEST ONCE D UTD    [provider]  Cholecalciferol (VITAMIN D) 50 MCG (2000 UT) CAPS Take 2,000 Units by mouth at bedtime.    [provider]  fluticasone (FLONASE) 50 MCG/ACT nasal spray Place 1 spray into both nostrils daily.     [provider]  furosemide (LASIX) 40 MG tablet Take 1 tablet (40 mg total) by mouth daily. 11/25/18   Irene Shipper, MD  Lactobacillus (PROBIOTIC ACIDOPHILUS) CAPS Take 1 capsule by mouth 2 (two) times a week.     [provider]  levothyroxine (SYNTHROID) 50 MCG tablet TAKE 1 TABLET EVERY DAY 09/02/18   Renato Shin, MD  metFORMIN (GLUCOPHAGE) 500 MG tablet Take 500 mg by mouth every evening. Take 1 tablet by mouth once daily     [provider]  montelukast (SINGULAIR) 10 MG tablet Take 10 mg by mouth at bedtime.  03/25/14   [provider]  Multiple Vitamin (MULTIVITAMIN) tablet Take 1 tablet by mouth at bedtime.     [provider]  spironolactone (ALDACTONE) 100 MG tablet Take 1 tablet (100 mg total) by mouth daily. 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[employer] resource strain: Not on file   Food insecurity    Worry: Not on file    Inability: Not on file   Transportation needs    Medical: Not on file    Non-medical: Not on file  Tobacco Use   Smoking status: Former Smoker    Quit date: 05/13/1990    Years since quitting: 28.5   Smokeless tobacco: Never Used  Substance and Sexual Activity   Alcohol use: No    Alcohol/week: 0.0 standard drinks   Drug use: No   Sexual activity: Not on file  Lifestyle  Physical activity    Days per week: Not on file    Minutes per session: Not on file   Stress: Not on file  Relationships   Social connections    Talks on phone: Not on file    Gets together: Not on file    Attends religious service: Not on file    Active member of club or organization: Not on file    Attends meetings of clubs or organizations: Not on file    Relationship status: Not on file  Other Topics Concern   Not on file  Social History Narrative   Not on file    ECOG Status: 0 - Asymptomatic  Review of Systems  Review of Systems: A 12 point ROS discussed and pertinent positives are indicated in the HPI above.  All other systems are negative.  Physical Exam No direct physical exam was performed (except for noted visual exam findings with Video Visits).   Vital Signs: There were no vitals taken for this visit.  Imaging: Nm Liver Tumor Loc Imflam Spect 1 Day  Result Date: 11/20/2018 CLINICAL DATA:  Nonalcoholic steatohepatitis with cirrhosis and unresectable hepatocellular carcinoma within segment 8. First therapy to right lobe of liver. Patient here for percutaneous, transarterial Y 90 microspheres radioembolization segmentectomy. EXAM: NUCLEAR MEDICINE SPECIAL MED RAD PHYSICS CONS; NUCLEAR MEDICINE RADIO PHARM THERAPY INTRA ARTERIAL; NUCLEAR MEDICINE TREATMENT PROCEDURE; NUCLEAR MEDICINE LIVER SCAN TECHNIQUE: In  conjunction with the interventional radiologist a Y- Microsphere dose was calculated utilizing body surface area formulation. Calculated dose equal 10.0 mCi. Pre therapy MAA liver SPECT scan and CTA were evaluated. Utilizing a microcatheter system, the hepatic artery branch to segment 8 was selected and Y-90 microspheres were delivered in fractionated aliquots. Radiopharmaceutical was delivered by the interventional radiologist and nuclear radiologist. The patient tolerated procedure well. No adverse effects were noted. Bremsstrahlung planar and SPECT imaging of the abdomen following intrahepatic arterial delivery of Y-90 microsphere was performed. RADIOPHARMACEUTICALS:  3.32 millicuries of Y 90 microspheres were delivered to the patient. COMPARISON:  CT and hepatic artery angiogram from 11/20/2018. FINDINGS: Y - 90 microspheres therapy as above. First therapy the right hepatic lobe. Bremsstrahlung planar and SPECT imaging of the abdomen following intrahepatic arterial delivery of Y-50mcrosphere demonstrates radioactivity localized to the hepatic lobe. No evidence of extrahepatic activity. IMPRESSION: Successful Y - 90 microsphere radioembolization segmentectomy of unresectable hepatoma within segment 8. First therapy to the right lobe. Bremssstrahlung scan demonstrates activity localized to segment 8 of the right hepatic lobe with no extrahepatic activity identified. Electronically Signed   By: TKerby MoorsM.D.   On: 11/20/2018 16:18   Ir Angiogram Visceral Selective  Result Date: 11/20/2018 INDICATION: 78year old female with NASH cirrhosis complicated by a 1.7 cm LI-RADS category 5 hepatocellular carcinoma which is demonstrated growth over time. She presents for segment 8 radiation segmentectomy with resin microspheres. EXAM: 1. Ultrasound-guided access left radial artery 2. SMA catheterization with arteriogram 3. Celiac artery catheterization with arteriogram 4. Proper hepatic artery catheterization with  arteriogram 5. Right hepatic artery catheterization with arteriogram 6. Segment 8 hepatic artery catheterization with arteriogram 7. Radioembolization MEDICATIONS: 3.375 g Zosyn, 8 mg Decadron. The antibiotic was administered within 1 hour of the procedure ANESTHESIA/SEDATION: Moderate (conscious) sedation was employed during this procedure. A total of Versed 2 mg and Fentanyl 100 mcg was administered intravenously. Moderate Sedation Time: 52 minutes. The patient's level of consciousness and vital signs were monitored continuously by radiology nursing throughout the procedure under my direct supervision. CONTRAST:  50 mL Isovue 370 FLUOROSCOPY TIME:  Fluoroscopy Time: 8 minutes 12 seconds (841 mGy). COMPLICATIONS: None immediate. PROCEDURE: Informed consent was obtained from the patient following explanation of the procedure, risks, benefits and alternatives. The patient understands, agrees and consents for the procedure. All questions were addressed. A time out was performed prior to the initiation of the procedure. Maximal barrier sterile technique utilized including caps, mask, sterile gowns, sterile gloves, large sterile drape, hand hygiene, and Betadine prep. The left radial artery was interrogated with ultrasound and found to be widely patent and of adequate diameter. An image was obtained and stored for the medical record. Local anesthesia was attained by infiltration with 1% lidocaine. Under real-time sonographic guidance, the vessel was punctured with a 21 gauge micropuncture needle. Using standard technique, the initial micro needle was exchanged over a 0.021 micro wire for a hydrophilic 5 French slim arterial sheath. The sheath was flushed and then a radial cocktail consisting of 5000 units heparin, 2.5 mg Verapamil and 200 mcg nitroglycerine was injected through the sheath. An ultimate 1 catheter was advanced over a Bentson wire into the abdominal aorta. The catheter was used to select the superior  mesenteric artery. An arteriogram was performed. The portal vein remains patent. The catheter was used to select the celiac axis. A celiac arteriogram was performed. Stable hepatic arterial anatomy. The C2 cobra catheter was advanced into the common hepatic artery over a glidewire. Arteriography was again performed. Hypervascular tumor blush is visualized in hepatic segment 8. A renegade hi Flo microcatheter was advanced over a Fathom 14 wire into the right hepatic artery. Arteriography was performed in multiple obliquities in till the origin of the segment 8 hepatic artery was successfully identified. The microcatheter was advanced into the segment 8 hepatic artery. Arteriography was performed confirming hypervascularity of the hepatocellular carcinoma. The microcatheter position was secured. Radio embolization was then performed using 3 day pre calibrated resident microspheres. A targeted goal of 10 millicuries was administered in aliquots according to protocol. Absence of stasis was confirmed multiple times throughout the administration. The microcatheter and 5 French catheter were then removed and appropriately disposed of. Hemostasis was attained using a TR band. The patient tolerated the procedure well. IMPRESSION: Technically successful segment 8 radiation segmentectomy with resin microspheres. Signed, Criselda Peaches, MD, [place] [facility] Radiology Electronically Signed   By: Jacqulynn Cadet M.D.   On: 11/20/2018 12:25   Ir Angiogram Visceral Selective  Result Date: 11/20/2018 INDICATION: 78 year old female with NASH cirrhosis complicated by a 1.7 cm LI-RADS category 5 hepatocellular carcinoma which is demonstrated growth over time. She presents for segment 8 radiation segmentectomy with resin microspheres. EXAM: 1. Ultrasound-guided access left radial artery 2. SMA catheterization with arteriogram 3. Celiac artery catheterization with arteriogram 4.  Proper hepatic artery catheterization with arteriogram 5. Right hepatic artery catheterization with arteriogram 6. Segment 8 hepatic artery catheterization with arteriogram 7. Radioembolization MEDICATIONS: 3.375 g Zosyn, 8 mg Decadron. The antibiotic was administered within 1 hour of the procedure ANESTHESIA/SEDATION: Moderate (conscious) sedation was employed during this procedure. A total of Versed 2 mg and Fentanyl 100 mcg was administered intravenously. Moderate Sedation Time: 52 minutes. The patient's level of consciousness and vital signs were monitored continuously by radiology nursing throughout the procedure under my direct supervision. CONTRAST:  50 mL Isovue 370 FLUOROSCOPY TIME:  Fluoroscopy Time: 8 minutes 12 seconds (841 mGy). COMPLICATIONS: None immediate. PROCEDURE: Informed consent was obtained from the patient following explanation of the procedure, risks,  benefits and alternatives. The patient understands, agrees and consents for the procedure. All questions were addressed. A time out was performed prior to the initiation of the procedure. Maximal barrier sterile technique utilized including caps, mask, sterile gowns, sterile gloves, large sterile drape, hand hygiene, and Betadine prep. The left radial artery was interrogated with ultrasound and found to be widely patent and of adequate diameter. An image was obtained and stored for the medical record. Local anesthesia was attained by infiltration with 1% lidocaine. Under real-time sonographic guidance, the vessel was punctured with a 21 gauge micropuncture needle. Using standard technique, the initial micro needle was exchanged over a 0.021 micro wire for a hydrophilic 5 French slim arterial sheath. The sheath was flushed and then a radial cocktail consisting of 5000 units heparin, 2.5 mg Verapamil and 200 mcg nitroglycerine was injected through the sheath. An ultimate 1 catheter was advanced over a Bentson wire into the abdominal aorta. The  catheter was used to select the superior mesenteric artery. An arteriogram was performed. The portal vein remains patent. The catheter was used to select the celiac axis. A celiac arteriogram was performed. Stable hepatic arterial anatomy. The C2 cobra catheter was advanced into the common hepatic artery over a glidewire. Arteriography was again performed. Hypervascular tumor blush is visualized in hepatic segment 8. A renegade hi Flo microcatheter was advanced over a Fathom 14 wire into the right hepatic artery. Arteriography was performed in multiple obliquities in till the origin of the segment 8 hepatic artery was successfully identified. The microcatheter was advanced into the segment 8 hepatic artery. Arteriography was performed confirming hypervascularity of the hepatocellular carcinoma. The microcatheter position was secured. Radio embolization was then performed using 3 day pre calibrated resident microspheres. A targeted goal of 10 millicuries was administered in aliquots according to protocol. Absence of stasis was confirmed multiple times throughout the administration. The microcatheter and 5 French catheter were then removed and appropriately disposed of. Hemostasis was attained using a TR band. The patient tolerated the procedure well. IMPRESSION: Technically successful segment 8 radiation segmentectomy with resin microspheres. Signed, Criselda Peaches, MD, [place] [facility] Radiology Electronically Signed   By: Jacqulynn Cadet M.D.   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 She presents for segment 8 radiation segmentectomy with resin microspheres. EXAM: 1. Ultrasound-guided access left radial artery 2. SMA catheterization with  arteriogram 3. Celiac artery catheterization with arteriogram 4. Proper hepatic artery catheterization with arteriogram 5. Right hepatic artery catheterization with arteriogram 6. Segment 8 hepatic artery catheterization with arteriogram 7. Radioembolization MEDICATIONS: 3.375 g Zosyn, 8 mg Decadron. The antibiotic was administered within 1 hour of the procedure ANESTHESIA/SEDATION: Moderate (conscious) sedation was employed during this procedure. A total of Versed 2 mg and Fentanyl 100 mcg was administered intravenously. Moderate Sedation Time: 52 minutes. The patient's level of consciousness and vital signs were monitored continuously by radiology nursing throughout the procedure under my direct supervision. CONTRAST:  50 mL Isovue 370 FLUOROSCOPY TIME:  Fluoroscopy Time: 8 minutes 12 seconds (841 mGy). COMPLICATIONS: None immediate. PROCEDURE: Informed consent was obtained from the patient following explanation of the procedure, risks, benefits and alternatives. The patient understands, agrees and consents for the procedure. All questions were addressed. A time out was performed prior to the initiation of the procedure. Maximal barrier  sterile technique utilized including caps, mask, sterile gowns, sterile gloves, large sterile drape, hand hygiene, and Betadine prep. The left radial artery was interrogated with ultrasound and found to be widely patent and of adequate diameter. An image was obtained and stored for the medical record. Local anesthesia was attained by infiltration with 1% lidocaine. Under real-time sonographic guidance, the vessel was punctured with a 21 gauge micropuncture needle. Using standard technique, the initial micro needle was exchanged over a 0.021 micro wire for a hydrophilic 5 French slim arterial sheath. The sheath was flushed and then a radial cocktail consisting of 5000 units heparin, 2.5 mg Verapamil and 200 mcg nitroglycerine was injected through the sheath. An ultimate 1 catheter  was advanced over a Bentson wire into the abdominal aorta. The catheter was used to select the superior mesenteric artery. An arteriogram was performed. The portal vein remains patent. The catheter was used to select the celiac axis. A celiac arteriogram was performed. Stable hepatic arterial anatomy. The C2 cobra catheter was advanced into the common hepatic artery over a glidewire. Arteriography was again performed. Hypervascular tumor blush is visualized in hepatic segment 8. A renegade hi Flo microcatheter was advanced over a Fathom 14 wire into the right hepatic artery. Arteriography was performed in multiple obliquities in till the origin of the segment 8 hepatic artery was successfully identified. The microcatheter was advanced into the segment 8 hepatic artery. Arteriography was performed confirming hypervascularity of the hepatocellular carcinoma. The microcatheter position was secured. Radio embolization was then performed using 3 day pre calibrated resident microspheres. A targeted goal of 10 millicuries was administered in aliquots according to protocol. Absence of stasis was confirmed multiple times throughout the administration. The microcatheter and 5 French catheter were then removed and appropriately disposed of. Hemostasis was attained using a TR band. The patient tolerated the procedure well. IMPRESSION: Technically successful segment 8 radiation segmentectomy with resin microspheres. Signed, Criselda Peaches, MD, [place] [facility] Electronically Signed   By: Jacqulynn Cadet M.D.   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 She presents for segment 8 radiation segmentectomy with resin microspheres. EXAM: 1. Ultrasound-guided  access left radial artery 2. SMA catheterization with arteriogram 3. Celiac artery catheterization with arteriogram 4. Proper hepatic artery catheterization with arteriogram 5. Right hepatic artery catheterization with arteriogram 6. Segment 8 hepatic artery catheterization with arteriogram 7. Radioembolization MEDICATIONS: 3.375 g Zosyn, 8 mg Decadron. The antibiotic was administered within 1 hour of the procedure ANESTHESIA/SEDATION: Moderate (conscious) sedation was employed during this procedure. A total of Versed 2 mg and Fentanyl 100 mcg was administered intravenously. Moderate Sedation Time: 52 minutes. The patient's level of consciousness and vital signs were monitored continuously by radiology nursing throughout the procedure under my direct supervision. CONTRAST:  50 mL Isovue 370 FLUOROSCOPY TIME:  Fluoroscopy Time: 8 minutes 12 seconds (841 mGy). COMPLICATIONS: None immediate. PROCEDURE: Informed consent was obtained from the patient following explanation of the procedure, risks, benefits and alternatives. The patient understands, agrees and consents for the procedure. All questions were addressed. A time out was performed prior to the initiation of the procedure. Maximal barrier sterile technique utilized including caps, mask, sterile gowns, sterile gloves, large sterile drape, hand hygiene, and Betadine prep. The left radial artery was interrogated with ultrasound and found to be  widely patent and of adequate diameter. An image was obtained and stored for the medical record. Local anesthesia was attained by infiltration with 1% lidocaine. Under real-time sonographic guidance, the vessel was punctured with a 21 gauge micropuncture needle. Using standard technique, the initial micro needle was exchanged over a 0.021 micro wire for a hydrophilic 5 French slim arterial sheath. The sheath was flushed and then a radial cocktail consisting of 5000 units heparin, 2.5 mg Verapamil and 200 mcg nitroglycerine was  injected through the sheath. An ultimate 1 catheter was advanced over a Bentson wire into the abdominal aorta. The catheter was used to select the superior mesenteric artery. An arteriogram was performed. The portal vein remains patent. The catheter was used to select the celiac axis. A celiac arteriogram was performed. Stable hepatic arterial anatomy. The C2 cobra catheter was advanced into the common hepatic artery over a glidewire. Arteriography was again performed. Hypervascular tumor blush is visualized in hepatic segment 8. A renegade hi Flo microcatheter was advanced over a Fathom 14 wire into the right hepatic artery. Arteriography was performed in multiple obliquities in till the origin of the segment 8 hepatic artery was successfully identified. The microcatheter was advanced into the segment 8 hepatic artery. Arteriography was performed confirming hypervascularity of the hepatocellular carcinoma. The microcatheter position was secured. Radio embolization was then performed using 3 day pre calibrated resident microspheres. A targeted goal of 10 millicuries was administered in aliquots according to protocol. Absence of stasis was confirmed multiple times throughout the administration. The microcatheter and 5 French catheter were then removed and appropriately disposed of. Hemostasis was attained using a TR band. The patient tolerated the procedure well. IMPRESSION: Technically successful segment 8 radiation segmentectomy with resin microspheres. Signed, Criselda Peaches, MD, [facility] Radiology Electronically Signed   By: Jacqulynn Cadet M.D.   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 She presents for segment 8 radiation segmentectomy  with resin microspheres. EXAM: 1. Ultrasound-guided access left radial artery 2. SMA catheterization with arteriogram 3. Celiac artery catheterization with arteriogram 4. Proper hepatic artery catheterization with arteriogram 5. Right hepatic artery catheterization with arteriogram 6. Segment 8 hepatic artery catheterization with arteriogram 7. Radioembolization MEDICATIONS: 3.375 g Zosyn, 8 mg Decadron. The antibiotic was administered within 1 hour of the procedure ANESTHESIA/SEDATION: Moderate (conscious) sedation was employed during this procedure. A total of Versed 2 mg and Fentanyl 100 mcg was administered intravenously. Moderate Sedation Time: 52 minutes. The patient's level of consciousness and vital signs were monitored continuously by radiology nursing throughout the procedure under my direct supervision. CONTRAST:  50 mL Isovue 370 FLUOROSCOPY TIME:  Fluoroscopy Time: 8 minutes 12 seconds (841 mGy). COMPLICATIONS: None immediate. PROCEDURE: Informed consent was obtained from the patient following explanation of the procedure, risks, benefits and alternatives. The patient understands, agrees and consents for the procedure. All questions were addressed. A time out was performed prior to the initiation of the procedure. Maximal barrier sterile technique utilized including caps, mask, sterile gowns, sterile gloves, large sterile drape, hand hygiene, and Betadine prep. The left radial artery was interrogated with ultrasound and found to be widely patent and of adequate diameter. An image was obtained and stored for the medical record. Local anesthesia was attained by infiltration with 1% lidocaine. Under real-time sonographic guidance, the  vessel was punctured with a 21 gauge micropuncture needle. Using standard technique, the initial micro needle was exchanged over a 0.021 micro wire for a hydrophilic 5 French slim arterial sheath. The sheath was flushed and then a radial cocktail consisting of 5000 units  heparin, 2.5 mg Verapamil and 200 mcg nitroglycerine was injected through the sheath. An ultimate 1 catheter was advanced over a Bentson wire into the abdominal aorta. The catheter was used to select the superior mesenteric artery. An arteriogram was performed. The portal vein remains patent. The catheter was used to select the celiac axis. A celiac arteriogram was performed. Stable hepatic arterial anatomy. The C2 cobra catheter was advanced into the common hepatic artery over a glidewire. Arteriography was again performed. Hypervascular tumor blush is visualized in hepatic segment 8. A renegade hi Flo microcatheter was advanced over a Fathom 14 wire into the right hepatic artery. Arteriography was performed in multiple obliquities in till the origin of the segment 8 hepatic artery was successfully identified. The microcatheter was advanced into the segment 8 hepatic artery. Arteriography was performed confirming hypervascularity of the hepatocellular carcinoma. The microcatheter position was secured. Radio embolization was then performed using 3 day pre calibrated resident microspheres. A targeted goal of 10 millicuries was administered in aliquots according to protocol. Absence of stasis was confirmed multiple times throughout the administration. The microcatheter and 5 French catheter were then removed and appropriately disposed of. Hemostasis was attained using a TR band. The patient tolerated the procedure well. IMPRESSION: Technically successful segment 8 radiation segmentectomy with resin microspheres. Signed, Criselda Peaches, MD, [facility] Radiology Electronically Signed   By: Jacqulynn Cadet M.D.   On: 11/20/2018 12:25   Nm Special Med Rad Physics Cons  Result Date: 11/20/2018 CLINICAL DATA:  Nonalcoholic steatohepatitis with cirrhosis and unresectable hepatocellular carcinoma within segment 8. First therapy to right lobe of liver. Patient  here for percutaneous, transarterial Y 90 microspheres radioembolization segmentectomy. EXAM: NUCLEAR MEDICINE SPECIAL MED RAD PHYSICS CONS; NUCLEAR MEDICINE RADIO PHARM THERAPY INTRA ARTERIAL; NUCLEAR MEDICINE TREATMENT PROCEDURE; NUCLEAR MEDICINE LIVER SCAN TECHNIQUE: In conjunction with the interventional radiologist a Y- Microsphere dose was calculated utilizing body surface area formulation. Calculated dose equal 10.0 mCi. Pre therapy MAA liver SPECT scan and CTA were evaluated. Utilizing a microcatheter system, the hepatic artery branch to segment 8 was selected and Y-90 microspheres were delivered in fractionated aliquots. Radiopharmaceutical was delivered by the interventional radiologist and nuclear radiologist. The patient tolerated procedure well. No adverse effects were noted. Bremsstrahlung planar and SPECT imaging of the abdomen following intrahepatic arterial delivery of Y-90 microsphere was performed. RADIOPHARMACEUTICALS:  9.32 millicuries of Y 90 microspheres were delivered to the patient. COMPARISON:  CT and hepatic artery angiogram from 11/20/2018. FINDINGS: Y - 90 microspheres therapy as above. First therapy the right hepatic lobe. Bremsstrahlung planar and SPECT imaging of the abdomen following intrahepatic arterial delivery of Y-61mcrosphere demonstrates radioactivity localized to the hepatic lobe. No evidence of extrahepatic activity. IMPRESSION: Successful Y - 90 microsphere radioembolization segmentectomy of unresectable hepatoma within segment 8. First therapy to the right lobe. Bremssstrahlung scan demonstrates activity localized to segment 8 of the right hepatic lobe with no extrahepatic activity identified. Electronically Signed   By: TKerby MoorsM.D.   On: 11/20/2018 16:18   Nm Special Treatment Procedure  Result Date: 11/20/2018 CLINICAL DATA:  Nonalcoholic steatohepatitis with cirrhosis and unresectable hepatocellular carcinoma within segment 8. First therapy to right lobe of  liver. Patient here  for percutaneous, transarterial Y 90 microspheres radioembolization segmentectomy. EXAM: NUCLEAR MEDICINE SPECIAL MED RAD PHYSICS CONS; NUCLEAR MEDICINE RADIO PHARM THERAPY INTRA ARTERIAL; NUCLEAR MEDICINE TREATMENT PROCEDURE; NUCLEAR MEDICINE LIVER SCAN TECHNIQUE: In conjunction with the interventional radiologist a Y- Microsphere dose was calculated utilizing body surface area formulation. Calculated dose equal 10.0 mCi. Pre therapy MAA liver SPECT scan and CTA were evaluated. Utilizing a microcatheter system, the hepatic artery branch to segment 8 was selected and Y-90 microspheres were delivered in fractionated aliquots. Radiopharmaceutical was delivered by the interventional radiologist and nuclear radiologist. The patient tolerated procedure well. No adverse effects were noted. Bremsstrahlung planar and SPECT imaging of the abdomen following intrahepatic arterial delivery of Y-90 microsphere was performed. RADIOPHARMACEUTICALS:  3.22 millicuries of Y 90 microspheres were delivered to the patient. COMPARISON:  CT and hepatic artery angiogram from 11/20/2018. FINDINGS: Y - 90 microspheres therapy as above. First therapy the right hepatic lobe. Bremsstrahlung planar and SPECT imaging of the abdomen following intrahepatic arterial delivery of Y-22mcrosphere demonstrates radioactivity localized to the hepatic lobe. No evidence of extrahepatic activity. IMPRESSION: Successful Y - 90 microsphere radioembolization segmentectomy of unresectable hepatoma within segment 8. First therapy to the right lobe. Bremssstrahlung scan demonstrates activity localized to segment 8 of the right hepatic lobe with no extrahepatic activity identified. Electronically Signed   By: TKerby MoorsM.D.   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 She presents for segment 8 radiation segmentectomy with resin microspheres. EXAM: 1. Ultrasound-guided access left radial artery 2. SMA catheterization with arteriogram 3. Celiac artery catheterization with arteriogram 4. Proper hepatic artery catheterization with arteriogram 5. Right hepatic artery catheterization with arteriogram 6. Segment 8 hepatic artery catheterization with arteriogram 7. Radioembolization MEDICATIONS: 3.375 g Zosyn, 8 mg Decadron. The antibiotic was administered within 1 hour of the procedure ANESTHESIA/SEDATION: Moderate (conscious) sedation was employed during this procedure. A total of Versed 2 mg and Fentanyl 100 mcg was administered intravenously. Moderate Sedation Time: 52 minutes. The patient's level of consciousness and vital signs were monitored continuously by radiology nursing throughout the procedure under my direct supervision. CONTRAST:  50 mL Isovue 370 FLUOROSCOPY TIME:  Fluoroscopy Time: 8 minutes 12 seconds (841 mGy). COMPLICATIONS: None immediate. PROCEDURE: Informed consent was obtained from the patient following explanation of the procedure, risks, benefits and alternatives. The patient understands, agrees and consents for the procedure. All questions were addressed. A time out was performed prior to the initiation of the procedure. Maximal barrier sterile technique utilized including caps, mask, sterile gowns, sterile gloves, large sterile drape, hand hygiene, and Betadine prep. The left radial artery was interrogated with ultrasound and found to be widely patent and of adequate diameter. An image was obtained and stored for the medical record. Local anesthesia was attained by infiltration with 1% lidocaine. Under real-time sonographic guidance, the vessel was punctured with a 21 gauge micropuncture needle. Using standard technique, the initial micro needle was exchanged over a 0.021 micro wire for a hydrophilic 5 French slim arterial  sheath. The sheath was flushed and then a radial cocktail consisting of 5000 units heparin, 2.5 mg Verapamil and 200 mcg nitroglycerine was injected through the sheath. An ultimate 1 catheter was advanced over a Bentson wire into the abdominal aorta. The catheter was used to select the superior mesenteric artery. An arteriogram was performed. The portal  vein remains patent. The catheter was used to select the celiac axis. A celiac arteriogram was performed. Stable hepatic arterial anatomy. The C2 cobra catheter was advanced into the common hepatic artery over a glidewire. Arteriography was again performed. Hypervascular tumor blush is visualized in hepatic segment 8. A renegade hi Flo microcatheter was advanced over a Fathom 14 wire into the right hepatic artery. Arteriography was performed in multiple obliquities in till the origin of the segment 8 hepatic artery was successfully identified. The microcatheter was advanced into the segment 8 hepatic artery. Arteriography was performed confirming hypervascularity of the hepatocellular carcinoma. The microcatheter position was secured. Radio embolization was then performed using 3 day pre calibrated resident microspheres. A targeted goal of 10 millicuries was administered in aliquots according to protocol. Absence of stasis was confirmed multiple times throughout the administration. The microcatheter and 5 French catheter were then removed and appropriately disposed of. Hemostasis was attained using a TR band. The patient tolerated the procedure well. IMPRESSION: Technically successful segment 8 radiation segmentectomy with resin microspheres. Signed, Criselda Peaches, MD, [place] [facility] Radiology Electronically Signed   By: Jacqulynn Cadet M.D.   On: 11/20/2018 12:25   Ir Embo Tumor Organ Ischemia Infarct Inc Guide Roadmapping  Result Date: 11/20/2018 INDICATION: 78 year old female with NASH cirrhosis  complicated by a 1.7 cm LI-RADS category 5 hepatocellular carcinoma which is demonstrated growth over time. She presents for segment 8 radiation segmentectomy with resin microspheres. EXAM: 1. Ultrasound-guided access left radial artery 2. SMA catheterization with arteriogram 3. Celiac artery catheterization with arteriogram 4. Proper hepatic artery catheterization with arteriogram 5. Right hepatic artery catheterization with arteriogram 6. Segment 8 hepatic artery catheterization with arteriogram 7. Radioembolization MEDICATIONS: 3.375 g Zosyn, 8 mg Decadron. The antibiotic was administered within 1 hour of the procedure ANESTHESIA/SEDATION: Moderate (conscious) sedation was employed during this procedure. A total of Versed 2 mg and Fentanyl 100 mcg was administered intravenously. Moderate Sedation Time: 52 minutes. The patient's level of consciousness and vital signs were monitored continuously by radiology nursing throughout the procedure under my direct supervision. CONTRAST:  50 mL Isovue 370 FLUOROSCOPY TIME:  Fluoroscopy Time: 8 minutes 12 seconds (841 mGy). COMPLICATIONS: None immediate. PROCEDURE: Informed consent was obtained from the patient following explanation of the procedure, risks, benefits and alternatives. The patient understands, agrees and consents for the procedure. All questions were addressed. A time out was performed prior to the initiation of the procedure. Maximal barrier sterile technique utilized including caps, mask, sterile gowns, sterile gloves, large sterile drape, hand hygiene, and Betadine prep. The left radial artery was interrogated with ultrasound and found to be widely patent and of adequate diameter. An image was obtained and stored for the medical record. Local anesthesia was attained by infiltration with 1% lidocaine. Under real-time sonographic guidance, the vessel was punctured with a 21 gauge micropuncture needle. Using standard technique, the initial micro needle was  exchanged over a 0.021 micro wire for a hydrophilic 5 French slim arterial sheath. The sheath was flushed and then a radial cocktail consisting of 5000 units heparin, 2.5 mg Verapamil and 200 mcg nitroglycerine was injected through the sheath. An ultimate 1 catheter was advanced over a Bentson wire into the abdominal aorta. The catheter was used to select the superior mesenteric artery. An arteriogram was performed. The portal vein remains patent. The catheter was used to select the celiac axis. A celiac arteriogram was performed. Stable hepatic arterial anatomy. The C2 cobra catheter was advanced  into the common hepatic artery over a glidewire. Arteriography was again performed. Hypervascular tumor blush is visualized in hepatic segment 8. A renegade hi Flo microcatheter was advanced over a Fathom 14 wire into the right hepatic artery. Arteriography was performed in multiple obliquities in till the origin of the segment 8 hepatic artery was successfully identified. The microcatheter was advanced into the segment 8 hepatic artery. Arteriography was performed confirming hypervascularity of the hepatocellular carcinoma. The microcatheter position was secured. Radio embolization was then performed using 3 day pre calibrated resident microspheres. A targeted goal of 10 millicuries was administered in aliquots according to protocol. Absence of stasis was confirmed multiple times throughout the administration. The microcatheter and 5 French catheter were then removed and appropriately disposed of. Hemostasis was attained using a TR band. The patient tolerated the procedure well. IMPRESSION: Technically successful segment 8 radiation segmentectomy with resin microspheres. Signed, Criselda Peaches, MD, [place] [facility] Radiology Electronically Signed   By: Jacqulynn Cadet M.D.   On: 11/20/2018 12:25   Nm Radio Pharm Therapy Intraarterial  Result Date:  11/20/2018 CLINICAL DATA:  Nonalcoholic steatohepatitis with cirrhosis and unresectable hepatocellular carcinoma within segment 8. First therapy to right lobe of liver. Patient here for percutaneous, transarterial Y 90 microspheres radioembolization segmentectomy. EXAM: NUCLEAR MEDICINE SPECIAL MED RAD PHYSICS CONS; NUCLEAR MEDICINE RADIO PHARM THERAPY INTRA ARTERIAL; NUCLEAR MEDICINE TREATMENT PROCEDURE; NUCLEAR MEDICINE LIVER SCAN TECHNIQUE: In conjunction with the interventional radiologist a Y- Microsphere dose was calculated utilizing body surface area formulation. Calculated dose equal 10.0 mCi. Pre therapy MAA liver SPECT scan and CTA were evaluated. Utilizing a microcatheter system, the hepatic artery branch to segment 8 was selected and Y-90 microspheres were delivered in fractionated aliquots. Radiopharmaceutical was delivered by the interventional radiologist and nuclear radiologist. The patient tolerated procedure well. No adverse effects were noted. Bremsstrahlung planar and SPECT imaging of the abdomen following intrahepatic arterial delivery of Y-90 microsphere was performed. RADIOPHARMACEUTICALS:  8.33 millicuries of Y 90 microspheres were delivered to the patient. COMPARISON:  CT and hepatic artery angiogram from 11/20/2018. FINDINGS: Y - 90 microspheres therapy as above. First therapy the right hepatic lobe. Bremsstrahlung planar and SPECT imaging of the abdomen following intrahepatic arterial delivery of Y-40mcrosphere demonstrates radioactivity localized to the hepatic lobe. No evidence of extrahepatic activity. IMPRESSION: Successful Y - 90 microsphere radioembolization segmentectomy of unresectable hepatoma within segment 8. First therapy to the right lobe. Bremssstrahlung scan demonstrates activity localized to segment 8 of the right hepatic lobe with no extrahepatic activity identified. Electronically Signed   By: TKerby MoorsM.D.   On: 11/20/2018 16:18   Ir Paracentesis  Result Date:  12/03/2018 INDICATION: Patient with history of abdominal distention, ascites. Request is made for diagnostic paracentesis. EXAM: ULTRASOUND GUIDED DIAGNOSTIC PARACENTESIS MEDICATIONS: 10 mL 1% lidocaine COMPLICATIONS: None immediate. PROCEDURE: Informed written consent was obtained from the patient after a discussion of the risks, benefits and alternatives to treatment. A timeout was performed prior to the initiation of the procedure. Initial ultrasound scanning demonstrates a small amount of ascites within the right lower abdominal quadrant. The right lower abdomen was prepped and draped in the usual sterile fashion. 1% lidocaine with epinephrine was used for local anesthesia. Following this, a 6 Fr Safe-T-Centesis catheter was introduced. An ultrasound image was saved for documentation purposes. The paracentesis was performed however only 100 cc of fluid was aspirated at which time patient developed pain and requested the procedure be terminated. The catheter was removed and  a dressing was applied. The patient tolerated the procedure well without immediate post procedural complication. FINDINGS: A total of approximately 100 mL of cloudy, pink fluid was removed. Samples were sent to the laboratory as requested by the clinical team. IMPRESSION: Successful ultrasound-guided diagnostic paracentesis yielding 100 mL of peritoneal fluid for diagnostic purposes. Additional fluid was aspirated at this time given patient discomfort with procedure. Read by: Brynda Greathouse PA-C Electronically Signed   By: Sandi Mariscal M.D.   On: 12/03/2018 09:52    Labs:  CBC: Recent Labs    11/03/18 0829 11/20/18 0817 12/01/18 0956 12/04/18 1020  WBC 4.7 4.9 4.6 4.0  HGB 13.5 13.8 13.5 13.9  HCT 40.6 40.8 40.2 42.0  PLT 126* 133* 111* 107*    COAGS: Recent Labs    07/23/18 1045 11/03/18 0829 11/20/18 0817 12/04/18 1020  INR 1.0 1.1 1.1 1.1  APTT  --  28 31  --     BMP: Recent Labs    11/03/18 0829  11/17/18 1004 11/20/18 0817 12/01/18 0956 12/04/18 1020  NA 137 137 134* 137 136  K 4.2 4.4 4.2 4.7 4.4  CL 103 103 101 103 103  CO2 23 26 23 25 26   GLUCOSE 106* 93 102* 106* 90  BUN 11 14 15 14 17   CALCIUM 9.2 9.0 9.4 9.5 9.2  CREATININE 0.69 0.69 0.68 0.79 0.71  GFRNONAA >60  --  >60 >60 >60  GFRAA >60  --  >60 >60 >60    LIVER FUNCTION TESTS: Recent Labs    07/23/18 1045 11/20/18 0817 12/01/18 0956 12/04/18 1020  BILITOT 0.9 1.1 0.8 1.1  AST 30 27 32 33  ALT 20 20 25 27   ALKPHOS 72 67 88 76  PROT 7.1 7.7 7.1 7.5  ALBUMIN 4.0 4.2 3.8 4.2    TUMOR MARKERS: No results for input(s): AFPTM, CEA, CA199, CHROMGRNA in the last 8760 hours.  Assessment and Plan:  Doing extremely well status post transarterial radioembolization segmentectomy of segment 8 with Y90.  Her labs look good and her recovery has been uneventful.  1.)  Follow-up MRI at 3 months post procedure (early October 2020) with accompanying clinic visit.   Electronically Signed: Jacqulynn Cadet 12/08/2018, 10:27 AM   I spent a total of  15 Minutes in remote  clinical consultation, greater than 50% of which was counseling/coordinating care for hepatocellular cancer.    Visit type: Audio only (telephone). Audio (no video) only due to patient preference. Alternative for in-person consultation at [facility], [address], [place], [place]. This visit type was conducted due to national recommendations for restrictions regarding the COVID-19 Pandemic (e.g. social distancing).  This format is felt to be most appropriate for this patient at this time.  All issues noted in this document were discussed and addressed.

## 2018-12-09 ENCOUNTER — Telehealth: Payer: Self-pay | Admitting: Internal Medicine

## 2018-12-09 NOTE — Telephone Encounter (Signed)
I was aware. Thanks

## 2018-12-09 NOTE — Telephone Encounter (Signed)
Pt called and wanted to let Dr. Henrene Pastor know that she did keep her appt for IR para last week but she made them stop the procedure due to pain. States that is hurts so bad she made them stop. Pt did have 100cc drawn off and the fluid was tested. Pt wanted to make sure Dr. Henrene Pastor know what had happened. Dr. Henrene Pastor notified.

## 2018-12-09 NOTE — Telephone Encounter (Signed)
Pt would like to schedule a paracentesis.

## 2018-12-11 ENCOUNTER — Other Ambulatory Visit (HOSPITAL_COMMUNITY): Payer: Medicare Other

## 2018-12-11 ENCOUNTER — Ambulatory Visit (HOSPITAL_COMMUNITY): Payer: Medicare Other

## 2018-12-11 DIAGNOSIS — E119 Type 2 diabetes mellitus without complications: Secondary | ICD-10-CM | POA: Diagnosis not present

## 2018-12-11 DIAGNOSIS — Z961 Presence of intraocular lens: Secondary | ICD-10-CM | POA: Diagnosis not present

## 2019-01-04 ENCOUNTER — Other Ambulatory Visit (INDEPENDENT_AMBULATORY_CARE_PROVIDER_SITE_OTHER): Payer: Medicare Other

## 2019-01-04 ENCOUNTER — Ambulatory Visit (INDEPENDENT_AMBULATORY_CARE_PROVIDER_SITE_OTHER): Payer: Medicare Other | Admitting: Internal Medicine

## 2019-01-04 ENCOUNTER — Encounter: Payer: Self-pay | Admitting: Internal Medicine

## 2019-01-04 ENCOUNTER — Other Ambulatory Visit: Payer: Self-pay

## 2019-01-04 VITALS — BP 110/60 | HR 75 | Temp 97.4°F | Ht 64.0 in | Wt 182.0 lb

## 2019-01-04 DIAGNOSIS — K429 Umbilical hernia without obstruction or gangrene: Secondary | ICD-10-CM

## 2019-01-04 DIAGNOSIS — R932 Abnormal findings on diagnostic imaging of liver and biliary tract: Secondary | ICD-10-CM

## 2019-01-04 DIAGNOSIS — K7460 Unspecified cirrhosis of liver: Secondary | ICD-10-CM | POA: Diagnosis not present

## 2019-01-04 DIAGNOSIS — R194 Change in bowel habit: Secondary | ICD-10-CM

## 2019-01-04 LAB — COMPREHENSIVE METABOLIC PANEL
ALT: 20 U/L (ref 0–35)
AST: 27 U/L (ref 0–37)
Albumin: 4.2 g/dL (ref 3.5–5.2)
Alkaline Phosphatase: 86 U/L (ref 39–117)
BUN: 13 mg/dL (ref 6–23)
CO2: 27 mEq/L (ref 19–32)
Calcium: 9.4 mg/dL (ref 8.4–10.5)
Chloride: 102 mEq/L (ref 96–112)
Creatinine, Ser: 0.72 mg/dL (ref 0.40–1.20)
GFR: 78.18 mL/min (ref >60.00)
Glucose, Bld: 100 mg/dL — ABNORMAL HIGH (ref 70–99)
Potassium: 4.4 mEq/L (ref 3.5–5.1)
Sodium: 137 mEq/L (ref 135–145)
Total Bilirubin: 0.5 mg/dL (ref 0.2–1.2)
Total Protein: 7 g/dL (ref 6.0–8.3)

## 2019-01-04 LAB — CBC WITH DIFFERENTIAL/PLATELET
Basophils Absolute: 0 10*3/uL (ref 0.0–0.1)
Basophils Relative: 0.6 % (ref 0.0–3.0)
Eosinophils Absolute: 0.1 10*3/uL (ref 0.0–0.7)
Eosinophils Relative: 1.6 % (ref 0.0–5.0)
HCT: 41.5 % (ref 36.0–46.0)
Hemoglobin: 13.9 g/dL (ref 12.0–15.0)
Lymphocytes Relative: 18.6 % (ref 12.0–46.0)
Lymphs Abs: 0.8 10*3/uL (ref 0.7–4.0)
MCHC: 33.5 g/dL (ref 30.0–36.0)
MCV: 90 fl (ref 78.0–100.0)
Monocytes Absolute: 0.4 10*3/uL (ref 0.1–1.0)
Monocytes Relative: 10.6 % (ref 3.0–12.0)
Neutro Abs: 2.8 10*3/uL (ref 1.4–7.7)
Neutrophils Relative %: 68.6 % (ref 43.0–77.0)
Platelets: 127 10*3/uL — ABNORMAL LOW (ref 150.0–400.0)
RBC: 4.61 Mil/uL (ref 3.87–5.11)
RDW: 14.2 % (ref 11.5–15.5)
WBC: 4.1 10*3/uL (ref 4.0–10.5)

## 2019-01-04 LAB — PROTIME-INR
INR: 1.2 ratio — ABNORMAL HIGH (ref 0.8–1.0)
Prothrombin Time: 13.7 s — ABNORMAL HIGH (ref 9.6–13.1)

## 2019-01-04 NOTE — Patient Instructions (Signed)
Your provider has requested that you go to the basement level for lab work before leaving today. Press "B" on the elevator. The lab is located at the first door on the left as you exit the elevator.  You have been scheduled for an endoscopy. Please follow written instructions given to you at your visit today. If you use inhalers (even only as needed), please bring them with you on the day of your procedure.  Per Dr. Henrene Pastor, begin taking one dose of Miralax daily, adjust as needed

## 2019-01-04 NOTE — Progress Notes (Signed)
HISTORY OF PRESENT ILLNESS:  Crystal Barnett is a 78 y.o. 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  She presents today for routine follow-up.  Last evaluated via telehealth medicine November 24, 2018.  Thereafter she underwent attempted paracentesis with only 100 cc withdrawn as the procedure was prematurely terminated due to discomfort.  She continues on her diuretics without change.  Her waist size is unchanged.  Her absolute weight is down.  Currently 182 pounds.  She denies bleeding, fevers, abdominal pain, or swelling.  No apparent issues with abnormal mentation.  Her chief complaints today are some soreness around the umbilicus with Valsalva and significant constipation for which he takes multiple doses of MiraLAX followed by diarrhea.  Her last EGD September 2019 was negative for varices.  REVIEW OF SYSTEMS:  All non-GI ROS negative unless otherwise stated in the HPI except for allergies  Past Medical History:  Diagnosis Date  . ALLERGIC RHINITIS 01/12/2008  . Allergy   . Cataract    bilateral  . Colon polyps    hyperplastic  . Diabetes ([place])   . Esophageal varices ([place])   . Fatty liver   . GOITER, MULTINODULAR 01/12/2008  . Hepatic cirrhosis ([place])   . Hyperlipidemia   . Hypertension   . HYPOTHYROIDISM 09/07/2008  . 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 Liver cirrhosis secondary to NASH ([place])   . Osteoporosis   . 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 Splenomegaly   . Thrombocytopenia ([place])    Appears chronic from at least 2014  . Vitamin D deficiency     Past Surgical History:  Procedure Laterality Date  . arthroscopic left knee  2005  . BREAST BIOPSY Left 2017  . BREAST EXCISIONAL BIOPSY Right 2008  . BREAST SURGERY  2008   Breast  Biopsy   . CHOLECYSTECTOMY  1985  . COLONOSCOPY    . IR ANGIOGRAM SELECTIVE EACH ADDITIONAL VESSEL  11/03/2018  . IR ANGIOGRAM SELECTIVE EACH ADDITIONAL VESSEL  11/03/2018  . IR ANGIOGRAM SELECTIVE EACH ADDITIONAL VESSEL  11/03/2018  . IR ANGIOGRAM SELECTIVE EACH ADDITIONAL VESSEL  11/03/2018  . IR ANGIOGRAM SELECTIVE EACH ADDITIONAL VESSEL  11/20/2018  . IR ANGIOGRAM SELECTIVE EACH ADDITIONAL VESSEL  11/20/2018  . IR ANGIOGRAM SELECTIVE EACH ADDITIONAL VESSEL  11/20/2018  . IR ANGIOGRAM VISCERAL SELECTIVE  11/03/2018  . IR ANGIOGRAM VISCERAL SELECTIVE  11/03/2018  . IR ANGIOGRAM VISCERAL SELECTIVE  11/03/2018  . IR ANGIOGRAM VISCERAL SELECTIVE  11/20/2018  . IR ANGIOGRAM VISCERAL SELECTIVE  11/20/2018  . IR EMBO TUMOR ORGAN ISCHEMIA INFARCT INC GUIDE ROADMAPPING  11/20/2018  . IR GENERIC HISTORICAL  04/02/2016   IR RADIOLOGIST EVAL & MGMT 04/02/2016 Jacqulynn Cadet, MD [facility] INTERV RAD  . IR PARACENTESIS  02/02/2018  . IR PARACENTESIS  04/20/2018  . IR PARACENTESIS  12/03/2018  . IR RADIOLOGIST EVAL & MGMT  10/29/2016  . IR RADIOLOGIST EVAL & MGMT  04/29/2017  . IR RADIOLOGIST EVAL & MGMT  11/04/2017  . IR RADIOLOGIST EVAL & MGMT  04/16/2018  . IR RADIOLOGIST EVAL & MGMT  12/08/2018  . IR US GUIDE VASC ACCESS LEFT  11/20/2018  . IR US GUIDE VASC ACCESS RIGHT  11/03/2018  . POLYPECTOMY    . UPPER GASTROINTESTINAL ENDOSCOPY  Social History CAROLY PUREWAL  reports that she quit smoking about 28 years ago. She has never used smokeless tobacco. She reports that she does not drink alcohol or use drugs.  family history includes Breast cancer in her mother; Goiter in her maternal grandmother; Stroke (age of onset: 86) in her father.  No Known Allergies     PHYSICAL EXAMINATION: Vital signs: BP 110/60   Pulse 75   Temp (!) 97.4 F (36.3 C)   Ht 5' 4"  (1.626 m)   Wt 182 lb (82.6 kg)   BMI 31.24 kg/m   Constitutional: generally well-appearing overweight elderly female, no acute  distress Psychiatric: alert and oriented x3, cooperative Eyes: extraocular movements intact, anicteric, conjunctiva pink Mouth: oral pharynx moist, no lesions Neck: supple no lymphadenopathy Cardiovascular: heart regular rate and rhythm, no murmur Lungs: clear to auscultation bilaterally Abdomen: soft, nontender, nondistended, no obvious ascites, no peritoneal signs, normal bowel sounds, no organomegaly.  Small umbilical hernia Rectal: Omitted Extremities: no clubbing, cyanosis, or lower extremity edema bilaterally.  Mild stasis changes Skin: no lesions on visible extremities Neuro: No focal deficits. No asterixis.    ASSESSMENT:  1.  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  Patient remains clinically stable.  No significant ascites today.  Last MELD score 6. 2.  Indeterminate liver lesion with slight interval growth and normal AFP status post radio embolization November 20, 2018.  Being followed by interventional radiology Dr. Laurence Ferrari 3.  Umbilical hernia without complication 4.  Constipation 5.  Multiple medical problems 6.  Colon cancer screening.  Last colonoscopy with Dr. Tilda Burrow at age 28 (January 2008) was complete with adequate prep.  Examination was normal save a diminutive polyp which was non-adenomatous.  She is aged out of routine screening  PLAN:  1.  Blood work today including CBC, comprehensive metabolic panel, PT/INR.  We will contact the patient with the results 2.  Continue Lasix 40 mg daily and Aldactone 100 mg daily 3.  Continue low-sodium diet 4.  Advised to take 1 dose of MiraLAX daily.  Titrate to need 5.  Advised to avoid heavy lifting with umbilical hernia.  Told to seek medical attention should she develop persistent pain suggesting incarceration 6.  Schedule EGD to screen for esophageal varices.  Patient is high risk given her age and comorbidities.The nature of the procedure, as well as the risks, benefits, and alternatives were carefully and  thoroughly reviewed with the patient. Ample time for discussion and questions allowed. The patient understood, was satisfied, and agreed to proceed. 7.  Ongoing management of liver lesion per IR 8.  Office follow-up 6 months.

## 2019-01-05 ENCOUNTER — Other Ambulatory Visit: Payer: Self-pay

## 2019-01-05 DIAGNOSIS — K7460 Unspecified cirrhosis of liver: Secondary | ICD-10-CM

## 2019-01-13 ENCOUNTER — Ambulatory Visit
Admission: RE | Admit: 2019-01-13 | Discharge: 2019-01-13 | Disposition: A | Discharge status: Home / Self Care | Payer: Medicare Other | Source: Ambulatory Visit | Attending: Family Medicine | Admitting: Family Medicine

## 2019-01-13 ENCOUNTER — Other Ambulatory Visit: Payer: Self-pay

## 2019-01-13 DIAGNOSIS — M810 Age-related osteoporosis without current pathological fracture: Secondary | ICD-10-CM | POA: Diagnosis not present

## 2019-01-13 DIAGNOSIS — Z1231 Encounter for screening mammogram for malignant neoplasm of breast: Secondary | ICD-10-CM | POA: Diagnosis not present

## 2019-01-13 DIAGNOSIS — M85851 Other specified disorders of bone density and structure, right thigh: Secondary | ICD-10-CM | POA: Diagnosis not present

## 2019-01-13 DIAGNOSIS — Z780 Asymptomatic menopausal state: Secondary | ICD-10-CM | POA: Diagnosis not present

## 2019-01-21 ENCOUNTER — Telehealth: Payer: Self-pay

## 2019-01-21 ENCOUNTER — Other Ambulatory Visit: Payer: Self-pay | Admitting: *Deleted

## 2019-01-21 NOTE — Progress Notes (Signed)
Addendum created 01/21/2019 : Patient requested AVS from office visit

## 2019-01-21 NOTE — Telephone Encounter (Signed)
Covid-19 screening questions   Do you now or have you had a fever in the last 14 days? NO   Do you have any respiratory symptoms of shortness of breath or cough now or in the last 14 days? NO   Do you have any family members or close contacts with diagnosed or suspected Covid-19 in the past 14 days? NO   Have you been tested for Covid-19 and found to be positive? NO

## 2019-01-22 ENCOUNTER — Encounter: Payer: Self-pay | Admitting: Internal Medicine

## 2019-01-22 ENCOUNTER — Ambulatory Visit (AMBULATORY_SURGERY_CENTER): Payer: Medicare Other | Admitting: Internal Medicine

## 2019-01-22 ENCOUNTER — Other Ambulatory Visit: Payer: Self-pay

## 2019-01-22 VITALS — BP 122/67 | HR 71 | Temp 98.2°F | Resp 10 | Ht 64.0 in | Wt 182.0 lb

## 2019-01-22 DIAGNOSIS — K2970 Gastritis, unspecified, without bleeding: Secondary | ICD-10-CM

## 2019-01-22 DIAGNOSIS — K259 Gastric ulcer, unspecified as acute or chronic, without hemorrhage or perforation: Secondary | ICD-10-CM

## 2019-01-22 DIAGNOSIS — K2950 Unspecified chronic gastritis without bleeding: Secondary | ICD-10-CM | POA: Diagnosis not present

## 2019-01-22 DIAGNOSIS — K766 Portal hypertension: Secondary | ICD-10-CM | POA: Diagnosis not present

## 2019-01-22 DIAGNOSIS — K3189 Other diseases of stomach and duodenum: Secondary | ICD-10-CM | POA: Diagnosis not present

## 2019-01-22 DIAGNOSIS — B9681 Helicobacter pylori [H. pylori] as the cause of diseases classified elsewhere: Secondary | ICD-10-CM

## 2019-01-22 DIAGNOSIS — K7460 Unspecified cirrhosis of liver: Secondary | ICD-10-CM

## 2019-01-22 MED ORDER — PANTOPRAZOLE SODIUM 40 MG PO TBEC: 40.0000 mg | DELAYED_RELEASE_TABLET | Freq: Two times a day (BID) | ORAL | 11 refills | Status: DC

## 2019-01-22 MED ORDER — SODIUM CHLORIDE 0.9 % IV SOLN: 500.0000 mL | Freq: Once | INTRAVENOUS | Status: DC

## 2019-01-22 NOTE — Progress Notes (Signed)
PT taken to PACU. Monitors in place. VSS. Report given to RN. 

## 2019-01-22 NOTE — Patient Instructions (Signed)
Dr. Henrene Pastor wants you to have a repeat EGD in 8 weeks. Schedule not available at this time, please call office to schedule.  New medicine of Pantoprazole 40 mg  twice daily ordered.      YOU HAD AN ENDOSCOPIC PROCEDURE TODAY AT [facility]:   Refer to the procedure report that was given to you for any specific questions about what was found during the examination.  If the procedure report does not answer your questions, please call your gastroenterologist to clarify.  If you requested that your care partner not be given the details of your procedure findings, then the procedure report has been included in a sealed envelope for you to review at your convenience later.  YOU SHOULD EXPECT: Some feelings of bloating in the abdomen. Passage of more gas than usual.  Walking can help get rid of the air that was put into your GI tract during the procedure and reduce the bloating. If you had a lower endoscopy (such as a colonoscopy or flexible sigmoidoscopy) you may notice spotting of blood in your stool or on the toilet paper. If you underwent a bowel prep for your procedure, you may not have a normal bowel movement for a few days.  Please Note:  You might notice some irritation and congestion in your nose or some drainage.  This is from the oxygen used during your procedure.  There is no need for concern and it should clear up in a day or so.  SYMPTOMS TO REPORT IMMEDIATELY:    Following upper endoscopy (EGD)  Vomiting of blood or coffee ground material  New chest pain or pain under the shoulder blades  Painful or persistently difficult swallowing  New shortness of breath  Fever of 100F or higher  Black, tarry-looking stools  For urgent or emergent issues, a gastroenterologist can be reached at any hour by calling [phone].   DIET:  We do recommend a small meal at first, but then you may proceed to your regular diet.  Drink plenty of fluids but you should avoid alcoholic  beverages for 24 hours.  ACTIVITY:  You should plan to take it easy for the rest of today and you should NOT DRIVE or use heavy machinery until tomorrow (because of the sedation medicines used during the test).    FOLLOW UP: Our staff will call the number listed on your records 48-72 hours following your procedure to check on you and address any questions or concerns that you may have regarding the information given to you following your procedure. If we do not reach you, we will leave a message.  We will attempt to reach you two times.  During this call, we will ask if you have developed any symptoms of COVID 19. If you develop any symptoms (ie: fever, flu-like symptoms, shortness of breath, cough etc.) before then, please call [phone].  If you test positive for Covid 19 in the 2 weeks post procedure, please call and report this information to [place].    If any biopsies were taken you will be contacted by phone or by letter within the next 1-3 weeks.  Please call us at [phone] if you have not heard about the biopsies in 3 weeks.    SIGNATURES/CONFIDENTIALITY: You and/or your care partner have signed paperwork which will be entered into your electronic medical record.  These signatures attest to the fact that that the information above on your After Visit Summary has been reviewed and is  understood.  Full responsibility of the confidentiality of this discharge information lies with you and/or your care-partner.

## 2019-01-22 NOTE — Progress Notes (Signed)
Schedule is not available yet to schedule repeat EGD in 8 weeks. Patient states she will call to reschedule. Put in recall

## 2019-01-22 NOTE — Progress Notes (Signed)
Called to room to assist during endoscopic procedure.  Patient ID and intended procedure confirmed with present staff. Received instructions for my participation in the procedure from the performing physician.  

## 2019-01-22 NOTE — Progress Notes (Signed)
Pt's states no medical or surgical changes since previsit or office visit.  Rock - vitals

## 2019-01-22 NOTE — Op Note (Signed)
[facility] Patient Name: Crystal Barnett Procedure Date: 01/22/2019 10:46 AM MRN: [id number] Endoscopist: Docia Chuck. Henrene Pastor , MD Age: 78 Referring MD:  Date of Birth: February 11, 1941 Gender: Female Account #: [id number] Procedure:                Upper GI endoscopy biopsies Indications:              Cirrhosis rule out esophageal varices Medicines:                Monitored Anesthesia Care Procedure:                Pre-Anesthesia Assessment:                           - Prior to the procedure, a History and Physical                            was performed, and patient medications and                            allergies were reviewed. The patient's tolerance of                            previous anesthesia was also reviewed. The risks                            and benefits of the procedure and the sedation                            options and risks were discussed with the patient.                            All questions were answered, and informed consent                            was obtained. Prior Anticoagulants: The patient has                            taken no previous anticoagulant or antiplatelet                            agents. ASA Grade Assessment: III - A patient with                            severe systemic disease. After reviewing the risks                            and benefits, the patient was deemed in                            satisfactory condition to undergo the procedure.                           After obtaining informed consent, the endoscope was  passed under direct vision. Throughout the                            procedure, the patient's blood pressure, pulse, and                            oxygen saturations were monitored continuously. The                            Endoscope was introduced through the mouth, and                            advanced to the second part of duodenum. The upper                            GI  endoscopy was accomplished without difficulty.                            The patient tolerated the procedure well. Scope In: Scope Out: Findings:                 The esophagus was normal. No varices.                           Many gastric ulcers were found in the gastric                            antrum posteriorly as well as the incisura.                            Biopsies were taken with a cold forceps for                            histology.                           The stomach also revealed changes of portal                            hypertensive gastropathy. No proximal gastric                            varices.                           The examined duodenum was normal.                           The cardia and gastric fundus were normal on                            retroflexion. Complications:            No immediate complications. Estimated Blood Loss:     Estimated blood loss: none. Impression:               1. No esophageal or gastric  varices                           2. Multiple gastric ulcers as described                           3. Otherwise normal EGD. Recommendation:           1. Prescribe pantoprazole 40 mg twice daily; #60;                            11 refills                           2. Schedule repeat upper endoscopy in the [facility] [facility] in 8                            weeks to assure ulcer healing                           3. Follow-up biopsies                           4. Continue previous medications                           5. Resume previous diet. Docia Chuck. Henrene Pastor, MD 01/22/2019 11:09:47 AM This report has been signed electronically.

## 2019-01-26 ENCOUNTER — Telehealth: Payer: Self-pay

## 2019-01-26 ENCOUNTER — Telehealth: Payer: Self-pay | Admitting: *Deleted

## 2019-01-26 NOTE — Telephone Encounter (Signed)
No answer for post procedure follow up call. Left message and will attempt to call back later. SM

## 2019-01-26 NOTE — Telephone Encounter (Signed)
  Follow up Call-  Call back number 01/22/2019 01/28/2018  Post procedure Call Back phone  # [phone]  Permission to leave phone message Yes Yes  Some recent data might be hidden     Patient questions:  Do you have a fever, pain , or abdominal swelling? No. Pain Score  0 *  Have you tolerated food without any problems? Yes.    Have you been able to return to your normal activities? Yes.    Do you have any questions about your discharge instructions: Diet   No. Medications  No. Follow up visit  No.  Do you have questions or concerns about your Care? No.  Actions: * If pain score is 4 or above: No action needed, pain <4.  1. Have you developed a fever since your procedure? no  2.   Have you had an respiratory symptoms (SOB or cough) since your procedure? no  3.   Have you tested positive for COVID 19 since your procedure no  4.   Have you had any family members/close contacts diagnosed with the COVID 19 since your procedure?  no   If yes to any of these questions please route to Joylene John, RN and Alphonsa Gin, Therapist, sports.

## 2019-01-27 ENCOUNTER — Encounter: Payer: Self-pay | Admitting: Internal Medicine

## 2019-01-29 ENCOUNTER — Other Ambulatory Visit: Payer: Self-pay

## 2019-01-29 MED ORDER — AMOXICILLIN 500 MG PO CAPS: 1000.0000 mg | ORAL_CAPSULE | Freq: Two times a day (BID) | ORAL | 0 refills | Status: DC

## 2019-01-29 MED ORDER — CLARITHROMYCIN 500 MG PO TABS: 500.0000 mg | ORAL_TABLET | Freq: Two times a day (BID) | ORAL | 0 refills | Status: DC

## 2019-02-02 DIAGNOSIS — M810 Age-related osteoporosis without current pathological fracture: Secondary | ICD-10-CM | POA: Diagnosis not present

## 2019-02-04 ENCOUNTER — Other Ambulatory Visit: Payer: Self-pay

## 2019-02-04 ENCOUNTER — Telehealth: Payer: Self-pay | Admitting: Internal Medicine

## 2019-02-04 ENCOUNTER — Other Ambulatory Visit (INDEPENDENT_AMBULATORY_CARE_PROVIDER_SITE_OTHER): Payer: Medicare Other

## 2019-02-04 DIAGNOSIS — K7460 Unspecified cirrhosis of liver: Secondary | ICD-10-CM | POA: Diagnosis not present

## 2019-02-04 LAB — BASIC METABOLIC PANEL
BUN: 12 mg/dL (ref 6–23)
CO2: 30 mEq/L (ref 19–32)
Calcium: 9.2 mg/dL (ref 8.4–10.5)
Chloride: 97 mEq/L (ref 96–112)
Creatinine, Ser: 0.76 mg/dL (ref 0.40–1.20)
GFR: 73.43 mL/min (ref >60.00)
Glucose, Bld: 65 mg/dL — ABNORMAL LOW (ref 70–99)
Potassium: 4.1 mEq/L (ref 3.5–5.1)
Sodium: 133 mEq/L — ABNORMAL LOW (ref 135–145)

## 2019-02-04 NOTE — Telephone Encounter (Signed)
Pt stated that she is returning your call.

## 2019-02-04 NOTE — Telephone Encounter (Signed)
See result note.  

## 2019-02-22 ENCOUNTER — Other Ambulatory Visit: Payer: Self-pay

## 2019-02-22 ENCOUNTER — Other Ambulatory Visit: Payer: Self-pay | Admitting: Interventional Radiology

## 2019-02-22 DIAGNOSIS — C220 Liver cell carcinoma: Secondary | ICD-10-CM

## 2019-03-05 ENCOUNTER — Other Ambulatory Visit: Payer: Self-pay

## 2019-03-05 ENCOUNTER — Telehealth: Payer: Self-pay | Admitting: *Deleted

## 2019-03-05 NOTE — Telephone Encounter (Signed)
I called pt and scheduled a covid test for 11-3 Tuesday prior to her 11-6 EGD since her PV is Thursday 11-5 and he EGD is Friday 11-6  the next day.  Scheduled for 11-3 Tuesday at 820 am - mailed AVS and letter with instructions

## 2019-03-08 ENCOUNTER — Other Ambulatory Visit: Payer: Self-pay

## 2019-03-08 ENCOUNTER — Ambulatory Visit (INDEPENDENT_AMBULATORY_CARE_PROVIDER_SITE_OTHER): Payer: Medicare Other | Admitting: Endocrinology

## 2019-03-08 ENCOUNTER — Encounter: Payer: Self-pay | Admitting: Endocrinology

## 2019-03-08 VITALS — BP 120/70 | HR 68 | Ht 64.0 in | Wt 182.8 lb

## 2019-03-08 DIAGNOSIS — E042 Nontoxic multinodular goiter: Secondary | ICD-10-CM | POA: Diagnosis not present

## 2019-03-08 NOTE — Patient Instructions (Signed)
Please continue the same levothyroxine. We can skip the ultrasound this year. Please come back for a follow-up appointment in 1 year.

## 2019-03-08 NOTE — Progress Notes (Signed)
Subjective:    Patient ID: Crystal Barnett, female    DOB: 05/21/1940, 78 y.o.   MRN: [id number]  HPI pt returns for f/u of multinodular goiter (dx'ed 2009, when bilat bxs were benign in 2009; the cytology did not show chronic thyroiditis, but she has chronic hypothyroidism; she has been on synthroid since 2009; f/u US in 2016 was unchanged; [place] in 2019 showed interval enlargement of the isthmus nodule; bx in 2019 showed BENIGN FOLLICULAR NODULE ([place] CATEGORY II)).  She notices the goiter, but says there has been no change.   Past Medical History:  Diagnosis Date  . ALLERGIC RHINITIS 01/12/2008  . Allergy   . Cataract    bilateral  . Colon polyps    hyperplastic  . Diabetes ([place])   . Esophageal varices ([place])   . Fatty liver   . GOITER, MULTINODULAR 01/12/2008  . Hepatic cirrhosis ([place])   . Hyperlipidemia   . Hypertension   . HYPOTHYROIDISM 09/07/2008  . 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 Liver cirrhosis secondary to NASH ([place])   . Osteoporosis   . 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 Splenomegaly   . Thrombocytopenia ([place])    Appears chronic from at least 2014  . Vitamin D deficiency     Past Surgical History:  Procedure Laterality Date  . arthroscopic left knee  2005  . BREAST BIOPSY Left 2017  . BREAST EXCISIONAL BIOPSY Right 2008  . BREAST SURGERY  2008   Breast Biopsy   . CHOLECYSTECTOMY  1985  . COLONOSCOPY    . IR ANGIOGRAM SELECTIVE EACH ADDITIONAL VESSEL  11/03/2018  . IR ANGIOGRAM SELECTIVE EACH ADDITIONAL VESSEL  11/03/2018  . IR ANGIOGRAM SELECTIVE EACH ADDITIONAL VESSEL  11/03/2018  . IR ANGIOGRAM SELECTIVE EACH ADDITIONAL VESSEL  11/03/2018  . IR ANGIOGRAM SELECTIVE EACH ADDITIONAL VESSEL  11/20/2018  . IR ANGIOGRAM SELECTIVE EACH ADDITIONAL VESSEL  11/20/2018  . IR ANGIOGRAM SELECTIVE EACH ADDITIONAL VESSEL  11/20/2018  . IR ANGIOGRAM VISCERAL SELECTIVE  11/03/2018  . IR ANGIOGRAM VISCERAL SELECTIVE  11/03/2018  .  IR ANGIOGRAM VISCERAL SELECTIVE  11/03/2018  . IR ANGIOGRAM VISCERAL SELECTIVE  11/20/2018  . IR ANGIOGRAM VISCERAL SELECTIVE  11/20/2018  . IR EMBO TUMOR ORGAN ISCHEMIA INFARCT INC GUIDE ROADMAPPING  11/20/2018  . IR GENERIC HISTORICAL  04/02/2016   IR RADIOLOGIST EVAL & MGMT 04/02/2016 Jacqulynn Cadet, MD [facility] INTERV RAD  . IR PARACENTESIS  02/02/2018  . IR PARACENTESIS  04/20/2018  . IR PARACENTESIS  12/03/2018  . IR RADIOLOGIST EVAL & MGMT  10/29/2016  . IR RADIOLOGIST EVAL & MGMT  04/29/2017  . IR RADIOLOGIST EVAL & MGMT  11/04/2017  . IR RADIOLOGIST EVAL & MGMT  04/16/2018  . IR RADIOLOGIST EVAL & MGMT  12/08/2018  . IR US GUIDE VASC ACCESS LEFT  11/20/2018  . IR US GUIDE VASC ACCESS RIGHT  11/03/2018  . POLYPECTOMY    . UPPER GASTROINTESTINAL ENDOSCOPY      Social History   Socioeconomic History  . Marital status: Single    Spouse name: Not on file  . Number of children: 1  . Years of education: Not on file  . Highest education level: Not on file  Occupational History  . Occupation: retired    Comment: works in Chiropodist for [employer]  . Financial resource strain: Not on file  . Food insecurity  Worry: Not on file    Inability: Not on file  . Transportation needs    Medical: Not on file    Non-medical: Not on file  Tobacco Use  . Smoking status: Former Smoker    Quit date: 05/13/1990    Years since quitting: 28.8  . Smokeless tobacco: Never Used  Substance and Sexual Activity  . Alcohol use: No    Alcohol/week: 0.0 standard drinks  . Drug use: No  . Sexual activity: Not on file  Lifestyle  . Physical activity    Days per week: Not on file    Minutes per session: Not on file  . Stress: Not on file  Relationships  . Social Herbalist on phone: Not on file    Gets together: Not on file    Attends religious service: Not on file    Active member of club or organization: Not on file    Attends meetings of clubs or  organizations: Not on file    Relationship status: Not on file  . Intimate partner violence    Fear of current or ex partner: Not on file    Emotionally abused: Not on file    Physically abused: Not on file    Forced sexual activity: Not on file  Other Topics Concern  . Not on file  Social History Narrative  . Not on file    Current Outpatient Medications on File Prior to Visit  Medication Sig Dispense Refill  . alendronate (FOSAMAX) 70 MG tablet Take 70 mg by mouth once a week. On Saturday    . Blood Glucose Monitoring Suppl (ONETOUCH VERIO FLEX SYSTEM) w/Device KIT OneTouch Verio System  USE TO TEST ONCE D UTD    . Cholecalciferol (VITAMIN D) 50 MCG (2000 UT) CAPS Take 2,000 Units by mouth at bedtime.    . fluticasone (FLONASE) 50 MCG/ACT nasal spray Place 1 spray into both nostrils daily.     . Lactobacillus (PROBIOTIC ACIDOPHILUS) CAPS Take 1 capsule by mouth 2 (two) times a week.     . levothyroxine (SYNTHROID) 50 MCG tablet TAKE 1 TABLET EVERY DAY 90 tablet 3  . metFORMIN (GLUCOPHAGE) 500 MG tablet Take 500 mg by mouth every evening. Take 1 tablet by mouth once daily     . montelukast (SINGULAIR) 10 MG tablet Take 10 mg by mouth at bedtime.     . Multiple Vitamin (MULTIVITAMIN) tablet Take 1 tablet by mouth at bedtime.     . pantoprazole (PROTONIX) 40 MG tablet Take 1 tablet (40 mg total) by mouth 2 (two) times daily. 60 tablet 11   No current facility-administered medications on file prior to visit.     No Known Allergies  Family History  Problem Relation Age of Onset  . Goiter Maternal Grandmother   . Stroke Father 50  . Breast cancer Mother        in 17's  . Colon cancer Neg Hx   . Colon polyps Neg Hx   . Esophageal cancer Neg Hx   . Stomach cancer Neg Hx   . Rectal cancer Neg Hx     BP 120/70 (BP Location: Left Arm, Patient Position: Sitting, Cuff Size: Large)   Pulse 68   Ht _0  (1.626 m)   Wt 182 lb 12.8 oz (82.9 kg)   SpO2 99%   BMI 31.38 kg/m    Review of Systems Denies neck pain and sob.      Objective:  Physical Exam VITAL SIGNS:  See vs page GENERAL: no distress Neck: thyroid is 10 times normal size (R>L).  bilat nodules are again palpable.    outside test results are reviewed: TSH=1.4    Assessment & Plan:  Hypothyroidism: well-replaced MNG: clinically stable  Patient Instructions  Please continue the same levothyroxine. We can skip the ultrasound this year. Please come back for a follow-up appointment in 1 year.

## 2019-03-09 ENCOUNTER — Other Ambulatory Visit: Payer: Self-pay | Admitting: Internal Medicine

## 2019-03-10 ENCOUNTER — Other Ambulatory Visit (HOSPITAL_COMMUNITY)
Admission: RE | Admit: 2019-03-10 | Discharge: 2019-03-10 | Disposition: A | Discharge status: Home / Self Care | Payer: Medicare Other | Source: Ambulatory Visit | Attending: Interventional Radiology | Admitting: Interventional Radiology

## 2019-03-10 DIAGNOSIS — C220 Liver cell carcinoma: Secondary | ICD-10-CM | POA: Insufficient documentation

## 2019-03-10 LAB — COMPREHENSIVE METABOLIC PANEL
ALT: 24 U/L (ref 0–44)
AST: 30 U/L (ref 15–41)
Albumin: 4.1 g/dL (ref 3.5–5.0)
Alkaline Phosphatase: 63 U/L (ref 38–126)
Anion gap: 10 (ref 5–15)
BUN: 21 mg/dL (ref 8–23)
CO2: 24 mmol/L (ref 22–32)
Calcium: 9 mg/dL (ref 8.9–10.3)
Chloride: 101 mmol/L (ref 98–111)
Creatinine, Ser: 0.84 mg/dL (ref 0.44–1.00)
GFR calc Af Amer: >60 mL/min (ref >60)
GFR calc non Af Amer: >60 mL/min (ref >60)
Glucose, Bld: 94 mg/dL (ref 70–99)
Potassium: 4.4 mmol/L (ref 3.5–5.1)
Sodium: 135 mmol/L (ref 135–145)
Total Bilirubin: 1 mg/dL (ref 0.3–1.2)
Total Protein: 7 g/dL (ref 6.5–8.1)

## 2019-03-10 LAB — CBC
HCT: 41.7 % (ref 36.0–46.0)
Hemoglobin: 13.9 g/dL (ref 12.0–15.0)
MCH: 30 pg (ref 26.0–34.0)
MCHC: 33.3 g/dL (ref 30.0–36.0)
MCV: 89.9 fL (ref 80.0–100.0)
Platelets: 110 10*3/uL — ABNORMAL LOW (ref 150–400)
RBC: 4.64 MIL/uL (ref 3.87–5.11)
RDW: 13.2 % (ref 11.5–15.5)
WBC: 3.4 10*3/uL — ABNORMAL LOW (ref 4.0–10.5)
nRBC: 0 % (ref 0.0–0.2)

## 2019-03-10 LAB — PROTIME-INR
INR: 1.1 (ref 0.8–1.2)
Prothrombin Time: 13.6 seconds (ref 11.4–15.2)

## 2019-03-11 ENCOUNTER — Other Ambulatory Visit: Payer: Self-pay

## 2019-03-11 ENCOUNTER — Ambulatory Visit (HOSPITAL_COMMUNITY)
Admission: RE | Admit: 2019-03-11 | Discharge: 2019-03-11 | Disposition: A | Discharge status: Home / Self Care | Payer: Medicare Other | Source: Ambulatory Visit | Attending: Interventional Radiology | Admitting: Interventional Radiology

## 2019-03-11 DIAGNOSIS — K7460 Unspecified cirrhosis of liver: Secondary | ICD-10-CM | POA: Diagnosis not present

## 2019-03-11 DIAGNOSIS — C220 Liver cell carcinoma: Secondary | ICD-10-CM | POA: Diagnosis not present

## 2019-03-11 DIAGNOSIS — Z23 Encounter for immunization: Secondary | ICD-10-CM | POA: Diagnosis not present

## 2019-03-11 DIAGNOSIS — C229 Malignant neoplasm of liver, not specified as primary or secondary: Secondary | ICD-10-CM | POA: Diagnosis not present

## 2019-03-11 LAB — AFP TUMOR MARKER: AFP, Serum, Tumor Marker: 5 ng/mL (ref 0.0–8.3)

## 2019-03-11 MED ORDER — GADOXETATE DISODIUM 0.25 MMOL/ML IV SOLN
10.0000 mL | Freq: Once | INTRAVENOUS | Status: AC | PRN
  Administered 2019-03-11: 8 mL via INTRAVENOUS

## 2019-03-16 ENCOUNTER — Other Ambulatory Visit: Payer: Self-pay | Admitting: Internal Medicine

## 2019-03-16 ENCOUNTER — Other Ambulatory Visit: Payer: Self-pay

## 2019-03-16 ENCOUNTER — Ambulatory Visit
Admission: RE | Admit: 2019-03-16 | Discharge: 2019-03-16 | Disposition: A | Discharge status: Home / Self Care | Payer: Medicare Other | Source: Ambulatory Visit | Attending: Interventional Radiology | Admitting: Interventional Radiology

## 2019-03-16 ENCOUNTER — Encounter: Payer: Self-pay | Admitting: *Deleted

## 2019-03-16 DIAGNOSIS — C220 Liver cell carcinoma: Secondary | ICD-10-CM | POA: Diagnosis not present

## 2019-03-16 HISTORY — PX: IR RADIOLOGIST EVAL & MGMT: IMG5224

## 2019-03-16 LAB — SARS CORONAVIRUS 2 (TAT 6-24 HRS): SARS Coronavirus 2: NEGATIVE

## 2019-03-16 NOTE — Progress Notes (Signed)
Chief Complaint: Patient was seen in follow-up remotely today (Crystal Barnett) for hepatocellular carcinoma at the request of [facility].    Referring Physician(s): Dr. Irene Limbo  History of Present Illness: Crystal Barnett is a 78 y.o. female  with ahistory ofNASHcirrhosis comlicatedby thrombocytopeniaand minimal ascites. MRI imaging was performed first in December 2016 to evaluate a region of possible concern in the left hepatic lobe. On that study, there was a small 9 mm enhancing focus in hepatic segment 7 in the posterosuperior aspect of the hepatic dome. This subsequently been followed at 6 month intervals with repeat MRI scans of the abdomen.  MRI from 12/5/19demonstrates more definitive enlargement of the lesion now measuring up to 1.9 cm. Additionally, there appears to be an enhancing pseudocapsule. On the present study, the imaging characteristics are consistent with a lie RADS 5 lesion which is diagnostic of hepatocellular carcinoma. The very slow growth rate over the past 3 years suggests a low-grade or indolent malignancy at this time.  Due to the presence of ascites and the location of the lesion high in the right dome just under the lung, we elected to proceed with transarterial radio embolization segmentectomy (8) which was performed on 11/17/2018.  Today, we are having our 85-monthpost procedure follow-up consultation via telemedicine.  Crystal Barnett doing well.  She denies fatigue, abdominal pain, nausea, vomiting or other clinical symptoms.  She is eager to hear the results of her MRI and lab values.  We discussed these at length.  Past Medical History:  Diagnosis Date  . ALLERGIC RHINITIS 01/12/2008  . Allergy   . Cataract    bilateral  . Colon polyps    hyperplastic  . Diabetes ([place]   . Esophageal varices ([place]   . Fatty liver   . GOITER, MULTINODULAR 01/12/2008  . Hepatic cirrhosis ([place]   . Hyperlipidemia   . Hypertension   . HYPOTHYROIDISM 09/07/2008   . 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 Liver cirrhosis secondary to NASH ([place]   . Osteoporosis   . 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 Splenomegaly   . Thrombocytopenia ([facility] [place]    Appears chronic from at least 2014  . Vitamin D deficiency     Past Surgical History:  Procedure Laterality Date  . arthroscopic left knee  2005  . BREAST BIOPSY Left 2017  . BREAST EXCISIONAL BIOPSY Right 2008  . BREAST SURGERY  2008   Breast Biopsy   . CHOLECYSTECTOMY  1985  . COLONOSCOPY    . IR ANGIOGRAM SELECTIVE EACH ADDITIONAL VESSEL  11/03/2018  . IR ANGIOGRAM SELECTIVE EACH ADDITIONAL VESSEL  11/03/2018  . IR ANGIOGRAM SELECTIVE EACH ADDITIONAL VESSEL  11/03/2018  . IR ANGIOGRAM SELECTIVE EACH ADDITIONAL VESSEL  11/03/2018  . IR ANGIOGRAM SELECTIVE EACH ADDITIONAL VESSEL  11/20/2018  . IR ANGIOGRAM SELECTIVE EACH ADDITIONAL VESSEL  11/20/2018  . IR ANGIOGRAM SELECTIVE EACH ADDITIONAL VESSEL  11/20/2018  . IR ANGIOGRAM VISCERAL SELECTIVE  11/03/2018  . IR ANGIOGRAM VISCERAL SELECTIVE  11/03/2018  . IR ANGIOGRAM VISCERAL SELECTIVE  11/03/2018  . IR ANGIOGRAM VISCERAL SELECTIVE  11/20/2018  . IR ANGIOGRAM VISCERAL SELECTIVE  11/20/2018  . IR EMBO TUMOR ORGAN ISCHEMIA INFARCT INC GUIDE ROADMAPPING  11/20/2018  . IR GENERIC HISTORICAL  04/02/2016   IR RADIOLOGIST EVAL & MGMT 04/02/2016 HJacqulynn Cadet MD [facility] INTERV RAD  . IR PARACENTESIS  02/02/2018  . IR PARACENTESIS  04/20/2018  .  IR PARACENTESIS  12/03/2018  . IR RADIOLOGIST EVAL & MGMT  10/29/2016  . IR RADIOLOGIST EVAL & MGMT  04/29/2017  . IR RADIOLOGIST EVAL & MGMT  11/04/2017  . IR RADIOLOGIST EVAL & MGMT  04/16/2018  . IR RADIOLOGIST EVAL & MGMT  12/08/2018  . IR US GUIDE VASC ACCESS LEFT  11/20/2018  . IR US GUIDE VASC ACCESS RIGHT  11/03/2018  . POLYPECTOMY    . UPPER GASTROINTESTINAL ENDOSCOPY      Allergies: Patient has no known allergies.  Medications: Prior to Admission medications    Medication Sig Start Date End Date Taking? Authorizing Provider  alendronate (FOSAMAX) 70 MG tablet Take 70 mg by mouth once a week. On Saturday 03/24/17   [provider]  Blood Glucose Monitoring Suppl ([place]) w/Device KIT OneTouch Verio System  USE TO TEST ONCE D UTD    [provider]  Cholecalciferol (VITAMIN D) 50 MCG (2000 UT) CAPS Take 2,000 Units by mouth at bedtime.    [provider]  fluticasone (FLONASE) 50 MCG/ACT nasal spray Place 1 spray into both nostrils daily.     [provider]  furosemide (LASIX) 40 MG tablet TAKE 1 TABLET EVERY DAY 03/10/19   Irene Shipper, MD  Lactobacillus (PROBIOTIC ACIDOPHILUS) CAPS Take 1 capsule by mouth 2 (two) times a week.     [provider]  levothyroxine (SYNTHROID) 50 MCG tablet TAKE 1 TABLET EVERY DAY 09/02/18   Renato Shin, MD  metFORMIN (GLUCOPHAGE) 500 MG tablet Take 500 mg by mouth every evening. Take 1 tablet by mouth once daily     [provider]  montelukast (SINGULAIR) 10 MG tablet Take 10 mg by mouth at bedtime.  03/25/14   [provider]  Multiple Vitamin (MULTIVITAMIN) tablet Take 1 tablet by mouth at bedtime.     [provider]  pantoprazole (PROTONIX) 40 MG tablet Take 1 tablet (40 mg total) by mouth 2 (two) times daily. 01/22/19   Irene Shipper, MD  spironolactone (ALDACTONE) 100 MG tablet TAKE 1 TABLET EVERY DAY 03/10/19   Irene Shipper, MD     Family History  Problem Relation Age of Onset  . Goiter Maternal Grandmother   . Stroke Father 28  . Breast cancer Mother        in 75's  . Colon cancer Neg Hx   . Colon polyps Neg Hx   . Esophageal cancer Neg Hx   . Stomach cancer Neg Hx   . Rectal cancer Neg Hx     Social History   Socioeconomic History  . Marital status: Single    Spouse name: Not on file  . Number of children: 1  . Years of education: Not on file  . Highest education level: Not on file  Occupational  History  . Occupation: retired    Comment: works in Chiropodist for [employer]  . Financial resource strain: Not on file  . Food insecurity    Worry: Not on file    Inability: Not on file  . Transportation needs    Medical: Not on file    Non-medical: Not on file  Tobacco Use  . Smoking status: Former Smoker    Quit date: 05/13/1990    Years since quitting: 28.8  . Smokeless tobacco: Never Used  Substance and Sexual Activity  . Alcohol use: No    Alcohol/week: 0.0 standard drinks  . Drug use: No  .  Sexual activity: Not on file  Lifestyle  . Physical activity    Days per week: Not on file    Minutes per session: Not on file  . Stress: Not on file  Relationships  . Social Herbalist on phone: Not on file    Gets together: Not on file    Attends religious service: Not on file    Active member of club or organization: Not on file    Attends meetings of clubs or organizations: Not on file    Relationship status: Not on file  Other Topics Concern  . Not on file  Social History Narrative  . Not on file    ECOG Status: 0 - Asymptomatic  Review of Systems  Review of Systems: A 12 point ROS discussed and pertinent positives are indicated in the HPI above.  All other systems are negative.  Physical Exam No direct physical exam was performed (except for noted visual exam findings with Video Visits).   Vital Signs: There were no vitals taken for this visit.  Imaging: Mr Abdomen Wwo Contrast  Result Date: 03/11/2019 CLINICAL DATA:  Cirrhosis. 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 Restaging. EXAM: MRI ABDOMEN WITHOUT AND WITH CONTRAST TECHNIQUE: Multiplanar multisequence MR imaging of the abdomen was performed both before and after the administration of intravenous contrast. CONTRAST:  47m EOVIST GADOXETATE DISODIUM 0.25 MOL/L IV SOLN COMPARISON:  04/16/2018 MRI abdomen. FINDINGS: Lower chest:  No acute abnormality at the lung bases. Hepatobiliary: Diffusely irregular liver surface with relative hypertrophy of the lateral segment left liver lobe, compatible with cirrhosis. No hepatic steatosis. The treated segment 7 right liver lesion measures 1.1 x 0.8 cm (series 502/image 32), decreased from 1.6 x 1.5 cm on 04/16/2018 MRI using similar measurement technique, without internal solid enhancement. Ill-defined geographic region of T2 hyperintensity and mild arterial phase enhancement demonstrates no washout and is favored to represent post treatment change. Tiny 0.4 cm focus of arterial phase enhancement in the anterior segment 2 left lower lobe (series 501/image 40) is occult on all other sequences, not definitely changed since 10/28/2017 MRI, favoring benign transient vascular phenomenon. No new liver lesions. Cholecystectomy. Bile ducts are stable and within normal post cholecystectomy limits. Common bile duct diameter 6 mm. No evidence of choledocholithiasis. No biliary strictures or beading. Pancreas: No pancreatic mass or duct dilation.  No pancreas divisum. Spleen: Mild splenomegaly. Craniocaudal splenic length 13.8 cm. No splenic mass. Adrenals/Urinary Tract: Normal adrenals. No hydronephrosis. Normal kidneys with no renal mass. Stomach/Bowel: Normal non-distended stomach. Visualized small and large bowel is normal caliber, with no bowel wall thickening. Vascular/Lymphatic: Normal caliber abdominal aorta. Patent portal, splenic, hepatic and renal veins. Small gastroesophageal and paraumbilical varices. No pathologically enlarged lymph nodes in the abdomen. Other: Small to moderate volume abdominal ascites. No focal fluid collections. Musculoskeletal: No aggressive appearing focal osseous lesions. Small T2 hyperintense right T12 vertebral lesion is stable and considered benign, probably a vertebral hemangioma. IMPRESSION: 1. Treated segment 7 right liver mass is decreased in size and demonstrates no  internal solid enhancement to suggest residual or recurrent viability. Geographic signal intensity changes at the liver dome surrounding the treated lesion, favor post treatment change. No new liver masses. Continued MRI surveillance recommended. 2. Cirrhosis. Small to moderate volume ascites. Mild splenomegaly. Small gastroesophageal and paraumbilical varices. Electronically Signed   By: JIlona SorrelM.D.   On: 03/11/2019 11:57    Labs:  CBC: Recent Labs    12/01/18  1027 12/04/18 1020 01/04/19 1424 03/10/19 0939  WBC 4.6 4.0 4.1 3.4*  HGB 13.5 13.9 13.9 13.9  HCT 40.2 42.0 41.5 41.7  PLT 111* 107* 127.0* 110*    COAGS: Recent Labs    11/03/18 0829 11/20/18 0817 12/04/18 1020 01/04/19 1424 03/10/19 0939  INR 1.1 1.1 1.1 1.2* 1.1  APTT 28 31  --   --   --     BMP: Recent Labs    11/20/18 0817 12/01/18 0956 12/04/18 1020 01/04/19 1424 02/04/19 1111 03/10/19 0939  NA 134* 137 136 137 133* 135  K 4.2 4.7 4.4 4.4 4.1 4.4  CL 101 103 103 102 97 101  CO2 23 25 26 27 30 24   GLUCOSE 102* 106* 90 100* 65* 94  BUN 15 14 17 13 12 21   CALCIUM 9.4 9.5 9.2 9.4 9.2 9.0  CREATININE 0.68 0.79 0.71 0.72 0.76 0.84  GFRNONAA >60 >60 >60  --   --  >60  GFRAA >60 >60 >60  --   --  >60    LIVER FUNCTION TESTS: Recent Labs    12/01/18 0956 12/04/18 1020 01/04/19 1424 03/10/19 0939  BILITOT 0.8 1.1 0.5 1.0  AST 32 33 27 30  ALT 25 27 20 24   ALKPHOS 88 76 86 63  PROT 7.1 7.5 7.0 7.0  ALBUMIN 3.8 4.2 4.2 4.1    TUMOR MARKERS: No results for input(s): AFPTM, CEA, CA199, CHROMGRNA in the last 8760 hours.  Assessment and Plan:  Crystal Barnett continues to do well clinically.  Her 32-monthpost Y 90 radiation segmentectomy MRI demonstrates an excellent response to therapy.  The targeted lesion is smaller and demonstrates no convincing evidence of residual enhancement.  This suggests a complete response to therapy.  She is extremely pleased with this good news and happy with her  result.  There are no new or concerning lesions at this time.  Her laboratory evaluation is also as expected.  She has mild neutropenia which may be secondary to the Y 90.  We will continue surveillance at 374-monthntervals for the first year.  1.)  Follow-up MRI with gadolinium, liver labs and clinic visit in 3 months.   Electronically Signed: HeJacqulynn Cadet1/07/2018, 10:52 AM   I spent a total of 15 Minutes in remote  clinical consultation, greater than 50% of which was counseling/coordinating care for hepatocellular carcinoma.    Visit type: Audio only (telephone). Audio (no video) only due to patient preference. [facility] [place] visit type was conducted due to national recommendations for restrictions regarding the COVID-19 Pandemic (e.g. social distancing).  This format is felt to be most appropriate for this patient at this time.  All issues noted in this document were discussed and addressed.

## 2019-03-18 ENCOUNTER — Ambulatory Visit (AMBULATORY_SURGERY_CENTER): Payer: Medicare Other | Admitting: *Deleted

## 2019-03-18 ENCOUNTER — Other Ambulatory Visit: Payer: Self-pay

## 2019-03-18 VITALS — Temp 96.6°F | Ht 64.0 in | Wt 185.0 lb

## 2019-03-18 DIAGNOSIS — Z1159 Encounter for screening for other viral diseases: Secondary | ICD-10-CM

## 2019-03-18 DIAGNOSIS — K253 Acute gastric ulcer without hemorrhage or perforation: Secondary | ICD-10-CM

## 2019-03-18 NOTE — Progress Notes (Signed)
No egg or soy allergy known to patient  No issues with past sedation with any surgeries  or procedures, no intubation problems  No diet pills per patient No home 02 use per patient  No blood thinners per patient  Pt denies issues with constipation  No A fib or A flutter  EMMI video sent to pt's e mail  covid test 11-3 negative  Due to the COVID-19 pandemic we are asking patients to follow these guidelines. Please only bring one care partner. Please be aware that your care partner may wait in the car in the parking lot or if they feel like they will be too hot to wait in the car, they may wait in the lobby on the 4th floor. All care partners are required to wear a mask the entire time (we do not have any that we can provide them), they need to practice social distancing, and we will do a Covid check for all patient's and care partners when you arrive. Also we will check their temperature and your temperature. If the care partner waits in their car they need to stay in the parking lot the entire time and we will call them on their cell phone when the patient is ready for discharge so they can bring the car to the front of the building. Also all patient's will need to wear a mask into building.

## 2019-03-19 ENCOUNTER — Ambulatory Visit (AMBULATORY_SURGERY_CENTER): Payer: Medicare Other | Admitting: Internal Medicine

## 2019-03-19 ENCOUNTER — Encounter: Payer: Self-pay | Admitting: Internal Medicine

## 2019-03-19 VITALS — BP 134/69 | HR 63 | Temp 97.8°F | Resp 16 | Ht 64.0 in | Wt 185.0 lb

## 2019-03-19 DIAGNOSIS — K259 Gastric ulcer, unspecified as acute or chronic, without hemorrhage or perforation: Secondary | ICD-10-CM | POA: Diagnosis not present

## 2019-03-19 DIAGNOSIS — K7460 Unspecified cirrhosis of liver: Secondary | ICD-10-CM

## 2019-03-19 MED ORDER — SODIUM CHLORIDE 0.9 % IV SOLN: 500.0000 mL | INTRAVENOUS | Status: DC

## 2019-03-19 NOTE — Progress Notes (Signed)
A/ox3, pleased with MAC, report to RN 

## 2019-03-19 NOTE — Patient Instructions (Signed)
Continue your Pantoprazole 40 mg twice daily.  Expect a call from Dr Blanch Media office to schedule a routine office follow up in 4 months   YOU HAD AN ENDOSCOPIC PROCEDURE TODAY AT [facility]:   Refer to the procedure report that was given to you for any specific questions about what was found during the examination.  If the procedure report does not answer your questions, please call your gastroenterologist to clarify.  If you requested that your care partner not be given the details of your procedure findings, then the procedure report has been included in a sealed envelope for you to review at your convenience later.  YOU SHOULD EXPECT: Some feelings of bloating in the abdomen. Passage of more gas than usual.  Walking can help get rid of the air that was put into your GI tract during the procedure and reduce the bloating. If you had a lower endoscopy (such as a colonoscopy or flexible sigmoidoscopy) you may notice spotting of blood in your stool or on the toilet paper. If you underwent a bowel prep for your procedure, you may not have a normal bowel movement for a few days.  Please Note:  You might notice some irritation and congestion in your nose or some drainage.  This is from the oxygen used during your procedure.  There is no need for concern and it should clear up in a day or so.  SYMPTOMS TO REPORT IMMEDIATELY:    Following upper endoscopy (EGD)  Vomiting of blood or coffee ground material  New chest pain or pain under the shoulder blades  Painful or persistently difficult swallowing  New shortness of breath  Fever of 100F or higher  Black, tarry-looking stools  For urgent or emergent issues, a gastroenterologist can be reached at any hour by calling [phone].   DIET:  We do recommend a small meal at first, but then you may proceed to your regular diet.  Drink plenty of fluids but you should avoid alcoholic beverages for 24 hours.  ACTIVITY:  You should  plan to take it easy for the rest of today and you should NOT DRIVE or use heavy machinery until tomorrow (because of the sedation medicines used during the test).    FOLLOW UP: Our staff will call the number listed on your records 48-72 hours following your procedure to check on you and address any questions or concerns that you may have regarding the information given to you following your procedure. If we do not reach you, we will leave a message.  We will attempt to reach you two times.  During this call, we will ask if you have developed any symptoms of COVID 19. If you develop any symptoms (ie: fever, flu-like symptoms, shortness of breath, cough etc.) before then, please call [phone].  If you test positive for Covid 19 in the 2 weeks post procedure, please call and report this information to [place].    If any biopsies were taken you will be contacted by phone or by letter within the next 1-3 weeks.  Please call us at [phone] if you have not heard about the biopsies in 3 weeks.    SIGNATURES/CONFIDENTIALITY: You and/or your care partner have signed paperwork which will be entered into your electronic medical record.  These signatures attest to the fact that that the information above on your After Visit Summary has been reviewed and is understood.  Full responsibility of the confidentiality of this discharge information lies  with you and/or your care-partner.

## 2019-03-19 NOTE — Progress Notes (Signed)
Temp JB  V/S CW  I have reviewed the patient's medical history in detail and updated the computerized patient record.

## 2019-03-19 NOTE — Progress Notes (Signed)
Called to room to assist during endoscopic procedure.  Patient ID and intended procedure confirmed with present staff. Received instructions for my participation in the procedure from the performing physician.  

## 2019-03-19 NOTE — Op Note (Signed)
[facility] Patient Name: Crystal Barnett Procedure Date: 03/19/2019 10:30 AM MRN: [id number] Endoscopist: Docia Chuck. Henrene Pastor , MD Age: 78 Referring MD:  Date of Birth: [birth year] Gender: Female Account #: [id number] Procedure:                Upper GI endoscopy with biopsies Indications:              Follow-up of acute gastric ulcer. Multiple gastric                            ulcers on endoscopy January 22, 2019. Has been                            treated for Helicobacter pylori. On pantoprazole 40                            mg twice daily Medicines:                Monitored Anesthesia Care Procedure:                Pre-Anesthesia Assessment:                           - Prior to the procedure, a History and Physical                            was performed, and patient medications and                            allergies were reviewed. The patient's tolerance of                            previous anesthesia was also reviewed. The risks                            and benefits of the procedure and the sedation                            options and risks were discussed with the patient.                            All questions were answered, and informed consent                            was obtained. Prior Anticoagulants: The patient has                            taken no previous anticoagulant or antiplatelet                            agents. ASA Grade Assessment: III - A patient with                            severe systemic disease. After reviewing the risks  and benefits, the patient was deemed in                            satisfactory condition to undergo the procedure.                           After obtaining informed consent, the endoscope was                            passed under direct vision. Throughout the                            procedure, the patient's blood pressure, pulse, and                            oxygen saturations were  monitored continuously. The                            Endoscope was introduced through the mouth, and                            advanced to the second part of duodenum. The upper                            GI endoscopy was accomplished without difficulty.                            The patient tolerated the procedure well. Scope In: Scope Out: Findings:                 The esophagus was normal.                           One non-bleeding superficial gastric ulcer was                            found on the posterior wall of the stomach. The                            lesion was 5 mm in largest dimension. Biopsies were                            taken with a cold forceps for histology. The                            multiple previous ulcers have healed.                           The examined duodenum was normal.                           The cardia and gastric fundus were normal on                            retroflexion. Complications:  No immediate complications. Estimated Blood Loss:     Estimated blood loss: none. Impression:               - Normal esophagus.                           - Non-bleeding small residual gastric ulcer.                            Biopsied. Multiple other ulcers healed                           - Normal examined duodenum. Recommendation:           1. Continue pantoprazole 40 mg twice daily                           2. Routine GI office follow-up in about 4 months                           3. Continue all other medications without change                           4. Resume previous diet                           5. Follow-up pathology  N. Henrene Pastor, MD 03/19/2019 10:53:03 AM This report has been signed electronically.

## 2019-03-23 ENCOUNTER — Telehealth: Payer: Self-pay

## 2019-03-23 NOTE — Telephone Encounter (Signed)
  Follow up Call-  Call back number 03/19/2019 01/22/2019 01/28/2018  Post procedure Call Back phone  # [phone]  Permission to leave phone message Yes Yes Yes  Some recent data might be hidden     Patient questions:  Do you have a fever, pain , or abdominal swelling? No. Pain Score  0 *  Have you tolerated food without any problems? Yes.    Have you been able to return to your normal activities? Yes.    Do you have any questions about your discharge instructions: Diet   No. Medications  No. Follow up visit  No.  Do you have questions or concerns about your Care? No.  Actions: * If pain score is 4 or above: No action needed, pain <4. 1. Have you developed a fever since your procedure? no  2.   Have you had an respiratory symptoms (SOB or cough) since your procedure? no  3.   Have you tested positive for COVID 19 since your procedure no  4.   Have you had any family members/close contacts diagnosed with the COVID 19 since your procedure?  no   If yes to any of these questions please route to Joylene John, RN and Alphonsa Gin, Therapist, sports.

## 2019-03-30 ENCOUNTER — Encounter: Payer: Self-pay | Admitting: Internal Medicine

## 2019-04-06 ENCOUNTER — Other Ambulatory Visit (INDEPENDENT_AMBULATORY_CARE_PROVIDER_SITE_OTHER): Payer: Medicare Other

## 2019-04-06 DIAGNOSIS — K7460 Unspecified cirrhosis of liver: Secondary | ICD-10-CM | POA: Diagnosis not present

## 2019-04-06 LAB — BASIC METABOLIC PANEL
BUN: 17 mg/dL (ref 6–23)
CO2: 30 mEq/L (ref 19–32)
Calcium: 9.4 mg/dL (ref 8.4–10.5)
Chloride: 101 mEq/L (ref 96–112)
Creatinine, Ser: 0.85 mg/dL (ref 0.40–1.20)
GFR: 64.51 mL/min (ref >60.00)
Glucose, Bld: 97 mg/dL (ref 70–99)
Potassium: 4.3 mEq/L (ref 3.5–5.1)
Sodium: 137 mEq/L (ref 135–145)

## 2019-04-07 ENCOUNTER — Other Ambulatory Visit: Payer: Self-pay

## 2019-04-07 DIAGNOSIS — K7460 Unspecified cirrhosis of liver: Secondary | ICD-10-CM

## 2019-04-19 DIAGNOSIS — M810 Age-related osteoporosis without current pathological fracture: Secondary | ICD-10-CM | POA: Diagnosis not present

## 2019-04-19 DIAGNOSIS — E039 Hypothyroidism, unspecified: Secondary | ICD-10-CM | POA: Diagnosis not present

## 2019-04-19 DIAGNOSIS — E559 Vitamin D deficiency, unspecified: Secondary | ICD-10-CM | POA: Diagnosis not present

## 2019-04-19 DIAGNOSIS — I10 Essential (primary) hypertension: Secondary | ICD-10-CM | POA: Diagnosis not present

## 2019-04-19 DIAGNOSIS — Z7984 Long term (current) use of oral hypoglycemic drugs: Secondary | ICD-10-CM | POA: Diagnosis not present

## 2019-04-19 DIAGNOSIS — K429 Umbilical hernia without obstruction or gangrene: Secondary | ICD-10-CM | POA: Diagnosis not present

## 2019-04-19 DIAGNOSIS — E119 Type 2 diabetes mellitus without complications: Secondary | ICD-10-CM | POA: Diagnosis not present

## 2019-04-19 DIAGNOSIS — K7581 Nonalcoholic steatohepatitis (NASH): Secondary | ICD-10-CM | POA: Diagnosis not present

## 2019-04-19 LAB — HEMOGLOBIN A1C: Hemoglobin A1C: 5.3

## 2019-04-19 LAB — TSH: TSH: 1.7 (ref 0.41–5.90)

## 2019-04-26 ENCOUNTER — Telehealth: Payer: Self-pay | Admitting: Internal Medicine

## 2019-04-26 NOTE — Telephone Encounter (Signed)
Left message on machine to call back  

## 2019-04-26 NOTE — Telephone Encounter (Signed)
Pls call pt, she would like to speak with you, she did not disclose much information.

## 2019-04-27 NOTE — Telephone Encounter (Signed)
Spoke with pt and she thinks the area is a little bigger and she has had some discomfort. She will try an abdominal binder and discuss with Dr. Henrene Pastor at her next OV.

## 2019-04-27 NOTE — Telephone Encounter (Signed)
Pt wants to know if she should schedule an appt to come and speak with Dr. Henrene Pastor about her umbilical hernia. States she has talked to Dr. Henrene Pastor about it in the past but now she states it hurts a little and she has difficulty laying on her left and right side. Discussed with her that Dr. Henrene Pastor does not take care of these but she states she wants to know what he thinks, does not want to diagnose herself. Please advise.

## 2019-04-27 NOTE — Telephone Encounter (Signed)
At the time of her recent office visit the hernia was small and uncomplicated appearing.  Has something changed?  If not, I would continue to observe.  She could wear a corset over that area to see if that helps.  If issues with severe pain, we can get a surgical opinion.  Otherwise, I will reassess this at her next office appointment in a few months.  Thanks

## 2019-04-27 NOTE — Telephone Encounter (Signed)
Left message for pt to call back  °

## 2019-05-20 ENCOUNTER — Telehealth: Payer: Self-pay | Admitting: *Deleted

## 2019-05-20 NOTE — Telephone Encounter (Addendum)
Patient called - should she receive Covid 19 vaccine? Message sent to Dr. Irene Limbo.  Contacted patient to inform that per Dr. Irene Limbo, she can receive vaccine.

## 2019-06-02 ENCOUNTER — Inpatient Hospital Stay: Payer: Medicare Other | Attending: Hematology

## 2019-06-02 ENCOUNTER — Other Ambulatory Visit: Payer: Self-pay

## 2019-06-02 ENCOUNTER — Inpatient Hospital Stay (HOSPITAL_BASED_OUTPATIENT_CLINIC_OR_DEPARTMENT_OTHER): Payer: Medicare Other | Admitting: Hematology

## 2019-06-02 VITALS — BP 146/67 | HR 68 | Temp 97.8°F | Resp 17 | Ht 64.0 in | Wt 193.4 lb

## 2019-06-02 DIAGNOSIS — K766 Portal hypertension: Secondary | ICD-10-CM | POA: Diagnosis not present

## 2019-06-02 DIAGNOSIS — Z87891 Personal history of nicotine dependence: Secondary | ICD-10-CM | POA: Diagnosis not present

## 2019-06-02 DIAGNOSIS — I10 Essential (primary) hypertension: Secondary | ICD-10-CM | POA: Diagnosis not present

## 2019-06-02 DIAGNOSIS — Z7984 Long term (current) use of oral hypoglycemic drugs: Secondary | ICD-10-CM | POA: Diagnosis not present

## 2019-06-02 DIAGNOSIS — D696 Thrombocytopenia, unspecified: Secondary | ICD-10-CM

## 2019-06-02 DIAGNOSIS — E039 Hypothyroidism, unspecified: Secondary | ICD-10-CM | POA: Insufficient documentation

## 2019-06-02 DIAGNOSIS — K5900 Constipation, unspecified: Secondary | ICD-10-CM | POA: Diagnosis not present

## 2019-06-02 DIAGNOSIS — E119 Type 2 diabetes mellitus without complications: Secondary | ICD-10-CM | POA: Insufficient documentation

## 2019-06-02 DIAGNOSIS — R197 Diarrhea, unspecified: Secondary | ICD-10-CM | POA: Diagnosis not present

## 2019-06-02 DIAGNOSIS — K7581 Nonalcoholic steatohepatitis (NASH): Secondary | ICD-10-CM | POA: Insufficient documentation

## 2019-06-02 DIAGNOSIS — R188 Other ascites: Secondary | ICD-10-CM | POA: Diagnosis not present

## 2019-06-02 DIAGNOSIS — C220 Liver cell carcinoma: Secondary | ICD-10-CM

## 2019-06-02 DIAGNOSIS — E559 Vitamin D deficiency, unspecified: Secondary | ICD-10-CM | POA: Insufficient documentation

## 2019-06-02 DIAGNOSIS — Z79899 Other long term (current) drug therapy: Secondary | ICD-10-CM | POA: Diagnosis not present

## 2019-06-02 DIAGNOSIS — R162 Hepatomegaly with splenomegaly, not elsewhere classified: Secondary | ICD-10-CM | POA: Insufficient documentation

## 2019-06-02 DIAGNOSIS — M810 Age-related osteoporosis without current pathological fracture: Secondary | ICD-10-CM | POA: Insufficient documentation

## 2019-06-02 DIAGNOSIS — K3189 Other diseases of stomach and duodenum: Secondary | ICD-10-CM | POA: Diagnosis not present

## 2019-06-02 DIAGNOSIS — K7460 Unspecified cirrhosis of liver: Secondary | ICD-10-CM | POA: Insufficient documentation

## 2019-06-02 DIAGNOSIS — Z86010 Personal history of colonic polyps: Secondary | ICD-10-CM | POA: Insufficient documentation

## 2019-06-02 LAB — CMP (CANCER CENTER ONLY)
ALT: 19 U/L (ref 0–44)
AST: 26 U/L (ref 15–41)
Albumin: 3.9 g/dL (ref 3.5–5.0)
Alkaline Phosphatase: 101 U/L (ref 38–126)
Anion gap: 11 (ref 5–15)
BUN: 14 mg/dL (ref 8–23)
CO2: 24 mmol/L (ref 22–32)
Calcium: 9 mg/dL (ref 8.9–10.3)
Chloride: 103 mmol/L (ref 98–111)
Creatinine: 0.82 mg/dL (ref 0.44–1.00)
GFR, Est AFR Am: >60 mL/min (ref >60)
GFR, Estimated: >60 mL/min (ref >60)
Glucose, Bld: 84 mg/dL (ref 70–99)
Potassium: 4.5 mmol/L (ref 3.5–5.1)
Sodium: 138 mmol/L (ref 135–145)
Total Bilirubin: 0.7 mg/dL (ref 0.3–1.2)
Total Protein: 7.4 g/dL (ref 6.5–8.1)

## 2019-06-02 LAB — CBC WITH DIFFERENTIAL/PLATELET
Abs Immature Granulocytes: 0.01 10*3/uL (ref 0.00–0.07)
Basophils Absolute: 0 10*3/uL (ref 0.0–0.1)
Basophils Relative: 1 %
Eosinophils Absolute: 0.1 10*3/uL (ref 0.0–0.5)
Eosinophils Relative: 2 %
HCT: 39.8 % (ref 36.0–46.0)
Hemoglobin: 13.6 g/dL (ref 12.0–15.0)
Immature Granulocytes: 0 %
Lymphocytes Relative: 18 %
Lymphs Abs: 0.7 10*3/uL (ref 0.7–4.0)
MCH: 30 pg (ref 26.0–34.0)
MCHC: 34.2 g/dL (ref 30.0–36.0)
MCV: 87.7 fL (ref 80.0–100.0)
Monocytes Absolute: 0.3 10*3/uL (ref 0.1–1.0)
Monocytes Relative: 9 %
Neutro Abs: 2.6 10*3/uL (ref 1.7–7.7)
Neutrophils Relative %: 70 %
Platelets: 133 10*3/uL — ABNORMAL LOW (ref 150–400)
RBC: 4.54 MIL/uL (ref 3.87–5.11)
RDW: 12.9 % (ref 11.5–15.5)
WBC: 3.7 10*3/uL — ABNORMAL LOW (ref 4.0–10.5)
nRBC: 0 % (ref 0.0–0.2)

## 2019-06-02 NOTE — Progress Notes (Signed)
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 Laurence Ferrari)  Current Treatment: observation and further evaluation   INTERVAL HISTORY:   Crystal Barnett is here for follow-up regarding her thrombocytopenia and regarding her concerning liver lesion - likely low grade HCC. The patient's last visit with [place] was on 12/01/2018. The pt reports that she is doing well overall.  The pt reports that she has an umbilical hernia and is not quite sure when it started. When she lays down it gets smaller. She has some pain in the area occasionally. Pt would like to have it addressed but is holding off on seeking medical intervention at this time due to the increased risk of COVID19. Pt often goes between diarrhea and constipation. She is currently on 40 mg of Lasix per day. She is scheduled to receive a COVID19 vaccine on 06/11/19.   Lab results today (06/02/19) of CBC w/diff and CMP is as follows: all values are WNL except for WBC at 3.7K, PLT at 133K. 06/02/2019 AFP tumor marker wnl at 4.5  On review of systems, pt reports abdominal discomfort, improving leg swelling, diarrhea, consipation denies and any other symptoms.    REVIEW OF SYSTEMS:   A 10+ POINT REVIEW OF SYSTEMS WAS OBTAINED including neurology, dermatology, psychiatry, cardiac, respiratory, lymph, extremities, GI, GU, Musculoskeletal, constitutional, breasts, reproductive, HEENT.  All pertinent positives are noted in the HPI.  All others are negative.  . Past Medical History:  Diagnosis Date  . ALLERGIC RHINITIS 01/12/2008  . Allergy   . Cancer [facility])     liver cancer hepatocellular carcinoma per pt   . Cataract    bilateral  . Colon polyps    hyperplastic  . Constipation    ongoing issues per pt  . Diabetes ([place])   . Esophageal varices ([place])   . Fatty liver   . GOITER, MULTINODULAR 01/12/2008  . Hepatic cirrhosis ([place])   . History of radiation therapy 11/20/2018   x 1 radiation for liver cancer  . Hypertension    under control  . HYPOTHYROIDISM 09/07/2008  . 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 Liver cirrhosis secondary to NASH ([place])   . Osteoporosis   . 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 Splenomegaly   . Thrombocytopenia ([place])    Appears chronic from at least 2014  . Vitamin D deficiency     . Past Surgical History:  Procedure Laterality Date  . arthroscopic left knee  2005  . BREAST BIOPSY Left 2017  . BREAST EXCISIONAL BIOPSY Right 2008  . BREAST SURGERY  2008   Breast Biopsy   . CHOLECYSTECTOMY  1985  . COLONOSCOPY    . IR ANGIOGRAM SELECTIVE EACH ADDITIONAL VESSEL  11/03/2018  . IR ANGIOGRAM SELECTIVE EACH ADDITIONAL VESSEL  11/03/2018  . IR ANGIOGRAM SELECTIVE EACH ADDITIONAL VESSEL  11/03/2018  . IR ANGIOGRAM SELECTIVE EACH ADDITIONAL VESSEL  11/03/2018  . IR ANGIOGRAM SELECTIVE EACH ADDITIONAL VESSEL  11/20/2018  . IR ANGIOGRAM SELECTIVE EACH ADDITIONAL VESSEL  11/20/2018  . IR ANGIOGRAM SELECTIVE EACH ADDITIONAL  VESSEL  11/20/2018  . IR ANGIOGRAM VISCERAL SELECTIVE  11/03/2018  . IR ANGIOGRAM VISCERAL SELECTIVE  11/03/2018  . IR ANGIOGRAM VISCERAL SELECTIVE  11/03/2018  . IR ANGIOGRAM VISCERAL SELECTIVE  11/20/2018  . IR ANGIOGRAM VISCERAL SELECTIVE  11/20/2018  . IR EMBO TUMOR ORGAN ISCHEMIA INFARCT INC GUIDE ROADMAPPING  11/20/2018  . IR GENERIC HISTORICAL  04/02/2016   IR RADIOLOGIST EVAL & MGMT 04/02/2016 Jacqulynn Cadet, MD [facility] INTERV RAD  . IR PARACENTESIS  02/02/2018  . IR PARACENTESIS  04/20/2018  . IR PARACENTESIS  12/03/2018  . IR RADIOLOGIST EVAL & MGMT   10/29/2016  . IR RADIOLOGIST EVAL & MGMT  04/29/2017  . IR RADIOLOGIST EVAL & MGMT  11/04/2017  . IR RADIOLOGIST EVAL & MGMT  04/16/2018  . IR RADIOLOGIST EVAL & MGMT  12/08/2018  . IR RADIOLOGIST EVAL & MGMT  03/16/2019  . IR US GUIDE VASC ACCESS LEFT  11/20/2018  . IR US GUIDE VASC ACCESS RIGHT  11/03/2018  . POLYPECTOMY    . UPPER GASTROINTESTINAL ENDOSCOPY      Social History   Tobacco Use  . Smoking status: Former Smoker    Quit date: 05/13/1990    Years since quitting: 29.0  . Smokeless tobacco: Never Used  Substance Use Topics  . Alcohol use: No    Alcohol/week: 0.0 standard drinks  . Drug use: No    ALLERGIES:  has No Known Allergies.   MEDICATIONS:  Current Outpatient Medications  Medication Sig Dispense Refill  . alendronate (FOSAMAX) 70 MG tablet Take 70 mg by mouth once a week. On Saturday    . Blood Glucose Monitoring Suppl (ONETOUCH VERIO FLEX SYSTEM) w/Device KIT OneTouch Verio System  USE TO TEST ONCE D UTD    . Calcium Carbonate-Vit D-Min (CALCIUM 1200 PO) Take 1,200 mg by mouth every other day.    . Cholecalciferol (VITAMIN D) 50 MCG (2000 UT) CAPS Take 2,000 Units by mouth at bedtime.    . fluticasone (FLONASE) 50 MCG/ACT nasal spray Place 1 spray into both nostrils daily.     . furosemide (LASIX) 40 MG tablet TAKE 1 TABLET EVERY DAY 90 tablet 1  . Lactobacillus (PROBIOTIC ACIDOPHILUS) CAPS Take 1 capsule by mouth 2 (two) times a week.     . levothyroxine (SYNTHROID) 50 MCG tablet TAKE 1 TABLET EVERY DAY 90 tablet 3  . metFORMIN (GLUCOPHAGE) 500 MG tablet Take 500 mg by mouth every evening. Take 1 tablet by mouth once daily     . montelukast (SINGULAIR) 10 MG tablet Take 10 mg by mouth at bedtime.     . Multiple Vitamin (MULTIVITAMIN) tablet Take 1 tablet by mouth at bedtime.     . pantoprazole (PROTONIX) 40 MG tablet Take 1 tablet (40 mg total) by mouth 2 (two) times daily. 60 tablet 11  . spironolactone (ALDACTONE) 100 MG tablet TAKE 1 TABLET EVERY DAY 90  tablet 1  . vitamin C (ASCORBIC ACID) 500 MG tablet Take 500 mg by mouth every other day.     No current facility-administered medications for this visit.    PHYSICAL EXAMINATION: ECOG PERFORMANCE STATUS: 1 - Symptomatic but completely ambulatory  Vitals:   06/02/19 1440  BP: (!) 146/67  Pulse: 68  Resp: 17  Temp: 97.8 F (36.6 C)  SpO2: 98%   Filed Weights   06/02/19 1440  Weight: 193 lb 6.4 oz (87.7 kg)   .Body mass index is 33.2 kg/m.  Exam was given in a chair  GENERAL:alert, in no acute distress and comfortable SKIN: no acute rashes, no significant lesions EYES: conjunctiva are pink and non-injected, sclera anicteric OROPHARYNX: MMM, no exudates, no oropharyngeal erythema or ulceration NECK: supple, no JVD LYMPH:  no palpable lymphadenopathy in the cervical, axillary or inguinal regions LUNGS: clear to auscultation b/l with normal respiratory effort HEART: regular rate & rhythm ABDOMEN:  normoactive bowel sounds , non tender, not distended. No palpable hepatosplenomegaly.  Extremity: trace pedal edema PSYCH: alert & oriented x 3 with fluent speech NEURO: no focal motor/sensory deficits  LABORATORY DATA:  I have reviewed the data as listed  . CBC Latest Ref Rng & Units 06/02/2019 03/10/2019 01/04/2019  WBC 4.0 - 10.5 K/uL 3.7(L) 3.4(L) 4.1  Hemoglobin 12.0 - 15.0 g/dL 13.6 13.9 13.9  Hematocrit 36.0 - 46.0 % 39.8 41.7 41.5  Platelets 150 - 400 K/uL 133(L) 110(L) 127.0(L)    CMP Latest Ref Rng & Units 06/02/2019 04/06/2019 03/10/2019  Glucose 70 - 99 mg/dL 84 97 94  BUN 8 - 23 mg/dL 14 17 21   Creatinine 0.44 - 1.00 mg/dL 0.82 0.85 0.84  Sodium 135 - 145 mmol/L 138 137 135  Potassium 3.5 - 5.1 mmol/L 4.5 4.3 4.4  Chloride 98 - 111 mmol/L 103 101 101  CO2 22 - 32 mmol/L 24 30 24   Calcium 8.9 - 10.3 mg/dL 9.0 9.4 9.0  Total Protein 6.5 - 8.1 g/dL 7.4 - 7.0  Total Bilirubin 0.3 - 1.2 mg/dL 0.7 - 1.0  Alkaline Phos 38 - 126 U/L 101 - 63  AST 15 - 41 U/L 26 -  30  ALT 0 - 44 U/L 19 - 24   AFP tumor marker 7/24- 4.8  RADIOLOGY  MRI Abdomen W WO Contrast 10/28/17  IMPRESSION: 1.2 cm hypervascular mass in the dome of the right hepatic lobe shows no significant change, however Imaging findings are diagnostic of hepatocellular carcinoma in the setting of cirrhosis. No evidence of abdominal metastatic disease. Hepatic cirrhosis, and mild splenomegaly consistent with portal venous hypertension. Moderate ascites, increased since prior study.  MRI abd w and wo contrast 04/23/17 stable appearance of segment 7 liver lesion. Highly suspicious for hepatocellular carcinoma. Cirrhosis, mild hepatic steatosis and splenomegaly.   MRI abd w and wo contrast 10/22/2016 CLINICAL DATA:  Followup of liver lesions. Nonalcoholic steatohepatitis induced cirrhosis. Thrombocytopenia. EXAM: MRI ABDOMEN WITHOUT AND WITH CONTRAST TECHNIQUE: Multiplanar multisequence MR imaging of the abdomen was performed both before and after the administration of intravenous contrast. CONTRAST:  59m EOVIST GADOXETATE DISODIUM 0.25 MOL/L IV SOLN COMPARISON:  MRI of 02/27/2016.  Clinic note of 04/02/2016. FINDINGS: Motion degradation involves the arterial phase post-contrast images primarily. Lower chest: Normal heart size without pericardial or pleural effusion. Hepatobiliary: Moderate to marked cirrhosis. Segment 7 liver lesion has undergone mild interval enlargement. For example, on T2 weighted imaging, measures 12 x 10 mm (image 13/series 7) versus 10 x 9 mm on the prior. On post-contrast image 27/ series 502, measures 17 x 13 mm today versus 14 x 10 mm on the prior (when remeasured). Demonstrates arterial and portal venous phase hyper enhancement with relative isointensity on more delayed post-contrast imaging. Left hepatic lobe tiny lesion is likely a cyst and is similar. Mild hepatic steatosis. Cholecystectomy, without biliary ductal dilatation. Pancreas:  Normal,  without mass or ductal dilatation. Spleen:  Mild splenomegaly, 14.3 cm craniocaudal. Adrenals/Urinary Tract: Normal adrenal glands. Normal kidneys, without hydronephrosis. Stomach/Bowel: Normal stomach and abdominal bowel loops. Vascular/Lymphatic: Patent hepatic and portal veins. No specific  evidence of portal venous hypertension. Circumaortic left renal vein. No retroperitoneal or retrocrural adenopathy. Other:  Small volume perihepatic ascites is unchanged Musculoskeletal: No acute osseous abnormality. IMPRESSION: 1. Mild interval enlargement of a segment 7 liver lesion, highly suspicious for hepatocellular carcinoma. 2. Cirrhosis, mild hepatic steatosis, and mild splenomegaly. Electronically Signed   By: Abigail Miyamoto M.D.   On: 10/22/2016 10:22    ASSESSMENT & PLAN:  79 y.o. female with  #1 Chronic thrombocytopenia. Platelet counts stable at 107k Likely related to cirrhosis related to NASH with mild splenomegaly.  B12, TSH, LDH within normal limits. MRI abd shows mild splenomegaly at 14.5 cm  #2 liver cirrhosis likely related to NASH hepatitis profile neg, ferritin unrevealing. Recent EGD showed no evidence of varices. Subtle changes of portal hypertensive gastropathy.  #3 Lesion in the RIGHT hepatic lobe has imaging characteristics highly suspicious for hepatic cellular carcinoma including arterial enhancement and a new peripheral rim.   04/23/17 MRI results do not indicate a significant change in the liver nodule   -Rpt MRI from 10/28/17 shows overall stable liver lesion at 1.2cm, mild splenomegaly, moderate ascites, liver cirrhosis.   04/16/18 MRI Abdomen which revealed There has been mild increase in size of hypervascular liver lesion within posterior dome. Enhancement characteristics are compatible with LR-5 (definitely [place]). No new lesions identified. 2. Morphologic features of liver compatible with cirrhosis with stigmata of portal venous hypertension.  03/11/2019:MRI  abd w and w/o contrast  IMPRESSION: 1. Treated segment 7 right liver mass is decreased in size and demonstrates no internal solid enhancement to suggest residual or recurrent viability. Geographic signal intensity changes at the liver dome surrounding the treated lesion, favor post treatment change. No new liver masses. Continued MRI surveillance recommended. 2. Cirrhosis. Small to moderate volume ascites. Mild splenomegaly. Small gastroesophageal and paraumbilical varices.  PLAN: -Discussed pt labwork today, 06/02/19; PLT look good, other blood counts and chemistries are stable  -Discussed 06/02/2019 AFP tumor marker wnl -s/p Y-90 radioembolization of HCC-- following with IR Dr Laurence Ferrari. MRI abd 03/10/2020 shows good response to rx.  -No indication for rx of her thrombocytopenia at this time  -Recommend pt f/u as scheduled for COVID19 vaccine -Continue Lasix as prescribed -Will see back in 6 months with labs   FOLLOW UP: RTC with Dr. Irene Limbo in 6 months with labs    The total time spent in the appt was 20 minutes and more than 50% was on counseling and direct patient cares.  All of the patient's questions were answered with apparent satisfaction. The patient knows to call the clinic with any problems, questions or concerns.    Sullivan Lone MD [place] AAHIVMS [facility] Hematology/Oncology Physician [facility]  (Office):       [phone] (Work cell):  [phone] (Fax):           [phone]  I, Yevette Edwards, am acting as a scribe for Dr. Sullivan Lone.   .I have reviewed the above documentation for accuracy and completeness, and I agree with the above. Brunetta Genera MD

## 2019-06-03 ENCOUNTER — Telehealth: Payer: Self-pay | Admitting: Hematology

## 2019-06-03 LAB — AFP TUMOR MARKER: AFP, Serum, Tumor Marker: 4.5 ng/mL (ref 0.0–8.3)

## 2019-06-03 NOTE — Telephone Encounter (Signed)
Scheduled appt per 1/20 los.  Sent a message to HIM pool to get a calendar mailed out.

## 2019-06-08 ENCOUNTER — Other Ambulatory Visit: Payer: Self-pay | Admitting: Endocrinology

## 2019-06-08 ENCOUNTER — Other Ambulatory Visit: Payer: Self-pay

## 2019-06-08 ENCOUNTER — Other Ambulatory Visit (INDEPENDENT_AMBULATORY_CARE_PROVIDER_SITE_OTHER): Payer: Medicare Other

## 2019-06-08 DIAGNOSIS — K7460 Unspecified cirrhosis of liver: Secondary | ICD-10-CM | POA: Diagnosis not present

## 2019-06-08 LAB — BASIC METABOLIC PANEL
BUN: 15 mg/dL (ref 6–23)
CO2: 29 mEq/L (ref 19–32)
Calcium: 9.6 mg/dL (ref 8.4–10.5)
Chloride: 101 mEq/L (ref 96–112)
Creatinine, Ser: 0.76 mg/dL (ref 0.40–1.20)
GFR: 73.37 mL/min (ref >60.00)
Glucose, Bld: 104 mg/dL — ABNORMAL HIGH (ref 70–99)
Potassium: 4.1 mEq/L (ref 3.5–5.1)
Sodium: 137 mEq/L (ref 135–145)

## 2019-06-10 ENCOUNTER — Telehealth: Payer: Self-pay | Admitting: Hematology

## 2019-06-10 NOTE — Telephone Encounter (Signed)
Rescheduled per 1/26 sch msg, pts req. Called and spoke with pt, pt confirmed 7/21 appt

## 2019-06-11 ENCOUNTER — Ambulatory Visit: Payer: Medicare Other

## 2019-06-14 ENCOUNTER — Other Ambulatory Visit: Payer: Self-pay | Admitting: Interventional Radiology

## 2019-06-14 DIAGNOSIS — C220 Liver cell carcinoma: Secondary | ICD-10-CM

## 2019-06-19 ENCOUNTER — Ambulatory Visit: Payer: Medicare Other

## 2019-07-02 ENCOUNTER — Other Ambulatory Visit (HOSPITAL_COMMUNITY)
Admission: RE | Admit: 2019-07-02 | Discharge: 2019-07-02 | Disposition: A | Discharge status: Home / Self Care | Payer: Medicare Other | Source: Ambulatory Visit | Attending: Interventional Radiology | Admitting: Interventional Radiology

## 2019-07-02 DIAGNOSIS — C220 Liver cell carcinoma: Secondary | ICD-10-CM | POA: Diagnosis not present

## 2019-07-02 LAB — PROTIME-INR
INR: 1.1 (ref 0.8–1.2)
Prothrombin Time: 14 seconds (ref 11.4–15.2)

## 2019-07-05 ENCOUNTER — Other Ambulatory Visit: Payer: Self-pay

## 2019-07-05 ENCOUNTER — Ambulatory Visit (HOSPITAL_COMMUNITY)
Admission: RE | Admit: 2019-07-05 | Discharge: 2019-07-05 | Disposition: A | Discharge status: Home / Self Care | Payer: Medicare Other | Source: Ambulatory Visit | Attending: Interventional Radiology | Admitting: Interventional Radiology

## 2019-07-05 DIAGNOSIS — C220 Liver cell carcinoma: Secondary | ICD-10-CM | POA: Diagnosis not present

## 2019-07-05 MED ORDER — GADOXETATE DISODIUM 0.25 MMOL/ML IV SOLN
8.0000 mL | Freq: Once | INTRAVENOUS | Status: AC | PRN
  Administered 2019-07-05: 8 mL via INTRAVENOUS

## 2019-07-08 ENCOUNTER — Ambulatory Visit
Admission: RE | Admit: 2019-07-08 | Discharge: 2019-07-08 | Disposition: A | Discharge status: Home / Self Care | Payer: Medicare Other | Source: Ambulatory Visit | Attending: Interventional Radiology | Admitting: Interventional Radiology

## 2019-07-08 ENCOUNTER — Encounter: Payer: Self-pay | Admitting: *Deleted

## 2019-07-08 ENCOUNTER — Other Ambulatory Visit: Payer: Self-pay

## 2019-07-08 DIAGNOSIS — C220 Liver cell carcinoma: Secondary | ICD-10-CM

## 2019-07-08 HISTORY — PX: IR RADIOLOGIST EVAL & MGMT: IMG5224

## 2019-07-08 NOTE — Progress Notes (Signed)
Chief Complaint: Patient was seen in follow-up remotely today (TeleHealth) for hepatocellular cancer at the request of [place].    Referring Physician(s): Dr. Irene Limbo  History of Present Illness: Crystal Barnett is a 79 y.o. 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 MRI imaging was performed first in December 2016 to evaluate a region of possible concern in the left hepatic lobe. On that study, there was a small 9 mm enhancing focus in hepatic segment 7 in the posterosuperior aspect of the hepatic dome. This subsequently been followed at 6 month intervals with repeat MRI scans of the abdomen.  MRI from 12/5/19demonstrates more definitive enlargement of the lesion now measuring up to 1.9 cm. Additionally, there appears to be an enhancing pseudocapsule. On the present study, the imaging characteristics are consistent with a Li-RADS 5 lesion which is diagnostic of hepatocellular carcinoma. The very slow growth rate over the past 3 years suggests a low-grade or indolent malignancy at this time.  Due to the presence of ascites and the location of the lesion high in the right dome just under the lung, we elected to proceed with transarterial radio embolization segmentectomy(8)which was performed on 11/17/2018.  Today, we are having our 52-monthpost procedure follow-up consultation via telemedicine.  Crystal Barnett doing well.  She denies fatigue, abdominal pain, nausea, vomiting or other clinical symptoms.    MRI 03/11/19: Treated segment 7 right liver mass is decreased in size and demonstrates no internal solid enhancement to suggest residual or recurrent viability. Geographic signal intensity changes at the liver dome surrounding the treated lesion, favor post treatment change. No new liver masses. Continued MRI surveillance recommended.  MRI 07/05/19: Stable appearance of treated segment 7 right liver lesion which is decreased  in size without internal enhancement. No new liver lesions.   Past Medical History:  Diagnosis Date  . ALLERGIC RHINITIS 01/12/2008  . Allergy   . Cancer ([facility]    liver cancer hepatocellular carcinoma per pt   . Cataract    bilateral  . Colon polyps    hyperplastic  . Constipation    ongoing issues per pt  . Diabetes ([place]   . Esophageal varices ([place]   . Fatty liver   . GOITER, MULTINODULAR 01/12/2008  . Hepatic cirrhosis ([facility]   . History of radiation therapy 11/20/2018   x 1 radiation for liver cancer  . Hypertension    under control  . HYPOTHYROIDISM 09/07/2008  . 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 Liver cirrhosis secondary to NASH ([facility]   . Osteoporosis   . 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 Splenomegaly   . Thrombocytopenia ([facility]    Appears chronic from at least 2014  . Vitamin D deficiency     Past Surgical History:  Procedure Laterality Date  . arthroscopic left knee  2005  . BREAST BIOPSY Left 2017  . BREAST EXCISIONAL BIOPSY Right 2008  . BREAST SURGERY  2008   Breast Biopsy   . CHOLECYSTECTOMY  1985  . COLONOSCOPY    . IR ANGIOGRAM SELECTIVE EACH ADDITIONAL VESSEL  11/03/2018  . IR ANGIOGRAM SELECTIVE EACH ADDITIONAL VESSEL  11/03/2018  . IR ANGIOGRAM SELECTIVE EACH ADDITIONAL VESSEL  11/03/2018  . IR ANGIOGRAM SELECTIVE EACH ADDITIONAL VESSEL  11/03/2018  . IR ANGIOGRAM SELECTIVE EACH ADDITIONAL VESSEL  11/20/2018  . IR ANGIOGRAM SELECTIVE EACH ADDITIONAL VESSEL  11/20/2018  . [place]  ADDITIONAL VESSEL  11/20/2018  . IR ANGIOGRAM VISCERAL SELECTIVE  11/03/2018  . IR ANGIOGRAM VISCERAL SELECTIVE  11/03/2018  . IR ANGIOGRAM VISCERAL SELECTIVE  11/03/2018  . IR ANGIOGRAM VISCERAL SELECTIVE  11/20/2018  . IR ANGIOGRAM VISCERAL SELECTIVE  11/20/2018  . IR EMBO TUMOR ORGAN ISCHEMIA INFARCT INC GUIDE ROADMAPPING  11/20/2018  . IR GENERIC HISTORICAL  04/02/2016   IR RADIOLOGIST EVAL & MGMT 04/02/2016  Jacqulynn Cadet, MD [facility] INTERV RAD  . IR PARACENTESIS  02/02/2018  . IR PARACENTESIS  04/20/2018  . IR PARACENTESIS  12/03/2018  . IR RADIOLOGIST EVAL & MGMT  10/29/2016  . IR RADIOLOGIST EVAL & MGMT  04/29/2017  . IR RADIOLOGIST EVAL & MGMT  11/04/2017  . IR RADIOLOGIST EVAL & MGMT  04/16/2018  . IR RADIOLOGIST EVAL & MGMT  12/08/2018  . IR RADIOLOGIST EVAL & MGMT  03/16/2019  . IR US GUIDE VASC ACCESS LEFT  11/20/2018  . IR US GUIDE VASC ACCESS RIGHT  11/03/2018  . POLYPECTOMY    . UPPER GASTROINTESTINAL ENDOSCOPY      Allergies: Patient has no known allergies.  Medications: Prior to Admission medications   Medication Sig Start Date End Date Taking? Authorizing Provider  alendronate (FOSAMAX) 70 MG tablet Take 70 mg by mouth once a week. On Saturday 03/24/17   [provider]  Blood Glucose Monitoring Suppl ([place]) w/Device KIT OneTouch Verio System  USE TO TEST ONCE D UTD    [provider]  Calcium Carbonate-Vit D-Min (CALCIUM 1200 PO) Take 1,200 mg by mouth every other day.    [provider]  Cholecalciferol (VITAMIN D) 50 MCG (2000 UT) CAPS Take 2,000 Units by mouth at bedtime.    [provider]  fluticasone (FLONASE) 50 MCG/ACT nasal spray Place 1 spray into both nostrils daily.     [provider]  furosemide (LASIX) 40 MG tablet TAKE 1 TABLET EVERY DAY 03/10/19   Irene Shipper, MD  Lactobacillus (PROBIOTIC ACIDOPHILUS) CAPS Take 1 capsule by mouth 2 (two) times a week.     [provider]  levothyroxine (SYNTHROID) 50 MCG tablet TAKE 1 TABLET EVERY DAY 06/08/19   Renato Shin, MD  metFORMIN (GLUCOPHAGE) 500 MG tablet Take 500 mg by mouth every evening. Take 1 tablet by mouth once daily     [provider]  montelukast (SINGULAIR) 10 MG tablet Take 10 mg by mouth at bedtime.  03/25/14   [provider]  Multiple Vitamin (MULTIVITAMIN) tablet Take 1 tablet by mouth at bedtime.      [provider]  pantoprazole (PROTONIX) 40 MG tablet Take 1 tablet (40 mg total) by mouth 2 (two) times daily. 01/22/19   Irene Shipper, MD  spironolactone (ALDACTONE) 100 MG tablet TAKE 1 TABLET EVERY DAY 03/10/19   Irene Shipper, MD  vitamin C (ASCORBIC ACID) 500 MG tablet Take 500 mg by mouth every other day.    [provider]     Family History  Problem Relation Age of Onset  . Goiter Maternal Grandmother   . Stroke Father 28  . Breast cancer Mother        in 8's  . Colon cancer Neg Hx   . Colon polyps Neg Hx   . Esophageal cancer Neg Hx   . Stomach cancer Neg Hx   . Rectal cancer Neg Hx     Social History   Socioeconomic History  . Marital status: Single  Spouse name: Not on file  . Number of children: 1  . Years of education: Not on file  . Highest education level: Not on file  Occupational History  . Occupation: retired    Comment: works in Chiropodist for [employer]  Tobacco Use  . Smoking status: Former Smoker    Quit date: 05/13/1990    Years since quitting: 29.1  . Smokeless tobacco: Never Used  Substance and Sexual Activity  . Alcohol use: No    Alcohol/week: 0.0 standard drinks  . Drug use: No  . Sexual activity: Not on file  Other Topics Concern  . Not on file  Social History Narrative  . Not on file   Social Determinants of Health   Financial Resource Strain:   . Difficulty of Paying Living Expenses: Not on file  Food Insecurity:   . Worried About Charity fundraiser in the Last Year: Not on file  . Ran Out of Food in the Last Year: Not on file  Transportation Needs:   . Lack of Transportation (Medical): Not on file  . Lack of Transportation (Non-Medical): Not on file  Physical Activity:   . Days of Exercise per Week: Not on file  . Minutes of Exercise per Session: Not on file  Stress:   . Feeling of Stress : Not on file  Social Connections:   . Frequency of Communication with Friends and Family: Not on file   . Frequency of Social Gatherings with Friends and Family: Not on file  . Attends Religious Services: Not on file  . Active Member of Clubs or Organizations: Not on file  . Attends Archivist Meetings: Not on file  . Marital Status: Not on file    ECOG Status: 0 - Asymptomatic  Review of Systems  Review of Systems: A 12 point ROS discussed and pertinent positives are indicated in the HPI above.  All other systems are negative.  Physical Exam No direct physical exam was performed (except for noted visual exam findings with Video Visits).   Vital Signs: There were no vitals taken for this visit.  Imaging: MR ABDOMEN WWO CONTRAST  Result Date: 07/05/2019 CLINICAL DATA:  Hepatocellular carcinoma.  Follow-up. EXAM: MRI ABDOMEN WITHOUT AND WITH CONTRAST TECHNIQUE: Multiplanar multisequence MR imaging of the abdomen was performed both before and after the administration of intravenous contrast. CONTRAST:  51m EOVIST GADOXETATE DISODIUM 0.25 MOL/L IV SOLN COMPARISON:  03/11/2019 FINDINGS: Lower chest: No acute findings. Hepatobiliary: There are advanced changes of cirrhosis. Treated lesion within segment 7 measures 0.9 x 0.7 cm, image 17/11. Previously 1.1 x 0.8 cm. No convincing evidence for internal enhancement identified on the subtraction images. There are no new liver lesions. Previously noted 4 mm area of arterial phase enhancement in segment 2 is not seen on today's study. Status post cholecystectomy.  No intrahepatic bile duct dilatation. Pancreas: No mass, inflammatory changes, or other parenchymal abnormality identified. Spleen: Spleen measures 15.5 cm in cranial caudal dimension, image 43/15. Adrenals/Urinary Tract: The adrenal glands are normal. No mass or hydronephrosis identified bilaterally. Stomach/Bowel: Visualized portions within the abdomen are unremarkable. Vascular/Lymphatic: Normal appearance of the abdominal aorta. The portal vein remains patent. Small esophageal and  gastric varices. No adenopathy. Other:  Interval increase in volume of ascites. Musculoskeletal: No suspicious bone lesions identified. IMPRESSION: 1. Stable appearance of treated segment 7 right liver lesion which is decreased in size without internal enhancement. No new liver lesions. 2. Cirrhosis. Stigmata of portal venous  hypertension includes progressive ascites. Splenomegaly and small gastroesophageal varices. Electronically Signed   By: Kerby Moors M.D.   On: 07/05/2019 14:12    Labs:  CBC: Recent Labs    12/04/18 1020 01/04/19 1424 03/10/19 0939 06/02/19 1355  WBC 4.0 4.1 3.4* 3.7*  HGB 13.9 13.9 13.9 13.6  HCT 42.0 41.5 41.7 39.8  PLT 107* 127.0* 110* 133*    COAGS: Recent Labs    11/03/18 0829 11/03/18 0829 11/20/18 0817 11/20/18 0817 12/04/18 1020 01/04/19 1424 03/10/19 0939 07/02/19 1345  INR 1.1   < > 1.1   < > 1.1 1.2* 1.1 1.1  APTT 28  --  31  --   --   --   --   --    < > = values in this interval not displayed.    BMP: Recent Labs    12/01/18 0956 12/01/18 0956 12/04/18 1020 01/04/19 1424 03/10/19 0939 04/06/19 0916 06/02/19 1355 06/08/19 0939  NA 137   < > 136   < > 135 137 138 137  K 4.7   < > 4.4   < > 4.4 4.3 4.5 4.1  CL 103   < > 103   < > 101 101 103 101  CO2 25   < > 26   < > 24 30 24 29   GLUCOSE 106*   < > 90   < > 94 97 84 104*  BUN 14   < > 17   < > 21 17 14 15   CALCIUM 9.5   < > 9.2   < > 9.0 9.4 9.0 9.6  CREATININE 0.79  --  0.71   < > 0.84 0.85 0.82 0.76  GFRNONAA >60  --  >60  --  >60  --  >60  --   GFRAA >60  --  >60  --  >60  --  >60  --    < > = values in this interval not displayed.    LIVER FUNCTION TESTS: Recent Labs    12/04/18 1020 01/04/19 1424 03/10/19 0939 06/02/19 1355  BILITOT 1.1 0.5 1.0 0.7  AST 33 27 30 26   ALT 27 20 24 19   ALKPHOS 76 86 63 101  PROT 7.5 7.0 7.0 7.4  ALBUMIN 4.2 4.2 4.1 3.9    TUMOR MARKERS: No results for input(s): AFPTM, CEA, CA199, CHROMGRNA in the last 8760  hours.  Assessment and Plan:  Doing well 3 months status post radiation segmentectomy of hepatic segment 7 lie RADS category 5 presumed hepatocellular carcinoma.  Most recent MRI imaging demonstrates no evidence of residual or recurrent disease.  There is no enhancement within the previously treated lesion and it has slightly decreased in size consistent with a complete response to therapy.  1.)  Continue routine surveillance with repeat MRI of the abdomen with gadolinium contrast, liver labs + AFP and clinic visit in 3 months.   Electronically Signed: Jacqulynn Cadet 07/08/2019, 10:34 AM   I spent a total of 15 Minutes in remote  clinical consultation, greater than 50% of which was counseling/coordinating care for Hepatocellular cancer.    Visit type: Audio only (telephone). Audio (no video) only due to patient preference. Alternative for in-person consultation at [facility], [address] [place], [place], [place]. This visit type was conducted due to national recommendations for restrictions regarding the COVID-19 Pandemic (e.g. social distancing).  This format is felt to be most appropriate for this patient at this time.  All issues noted in  this document were discussed and addressed.

## 2019-07-14 ENCOUNTER — Encounter: Payer: Self-pay | Admitting: Internal Medicine

## 2019-07-14 ENCOUNTER — Ambulatory Visit (INDEPENDENT_AMBULATORY_CARE_PROVIDER_SITE_OTHER): Payer: Medicare Other | Admitting: Internal Medicine

## 2019-07-14 ENCOUNTER — Other Ambulatory Visit: Payer: Self-pay

## 2019-07-14 VITALS — BP 140/80 | HR 76 | Temp 97.9°F | Ht 64.0 in | Wt 196.0 lb

## 2019-07-14 DIAGNOSIS — K7581 Nonalcoholic steatohepatitis (NASH): Secondary | ICD-10-CM | POA: Diagnosis not present

## 2019-07-14 DIAGNOSIS — K429 Umbilical hernia without obstruction or gangrene: Secondary | ICD-10-CM

## 2019-07-14 DIAGNOSIS — R932 Abnormal findings on diagnostic imaging of liver and biliary tract: Secondary | ICD-10-CM | POA: Diagnosis not present

## 2019-07-14 DIAGNOSIS — R188 Other ascites: Secondary | ICD-10-CM | POA: Diagnosis not present

## 2019-07-14 DIAGNOSIS — K7460 Unspecified cirrhosis of liver: Secondary | ICD-10-CM

## 2019-07-14 DIAGNOSIS — K253 Acute gastric ulcer without hemorrhage or perforation: Secondary | ICD-10-CM | POA: Diagnosis not present

## 2019-07-14 MED ORDER — SPIRONOLACTONE 100 MG PO TABS: 200.0000 mg | ORAL_TABLET | Freq: Every day | ORAL | 6 refills | Status: DC

## 2019-07-14 MED ORDER — SPIRONOLACTONE 100 MG PO TABS: 200.0000 mg | ORAL_TABLET | Freq: Every day | ORAL | 1 refills | Status: DC

## 2019-07-14 MED ORDER — FUROSEMIDE 40 MG PO TABS: 60.0000 mg | ORAL_TABLET | Freq: Every day | ORAL | 6 refills | Status: DC

## 2019-07-14 MED ORDER — FUROSEMIDE 40 MG PO TABS: 60.0000 mg | ORAL_TABLET | Freq: Every day | ORAL | 1 refills | Status: DC

## 2019-07-14 NOTE — Progress Notes (Signed)
HISTORY OF PRESENT ILLNESS:  Crystal Barnett is a 79 y.o. 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  She was last evaluated in this office January 04, 2019.  See that dictation for details.  At that time she was noted to have a small uncomplicated umbilical hernia.  She was continued on Lasix 40 mg daily and Aldactone 100 mg daily for her ascites.  Low-sodium diet.  Upper endoscopy was scheduled to screen for esophageal varices.  This was performed January 22, 2019.  She was found to have multiple gastric ulcers and Helicobacter pylori.  She was treated with twice daily pantoprazole and Helicobacter pylori eradication therapy.  Follow-up upper endoscopy March 19, 2019 revealed improvement in gastric ulceration.  Biopsies revealed lymphoid infiltrate.  Immunohistochemistry for Helicobacter pylori was negative.  She presents today for follow-up.  Patient tells me that she has had progressive abdominal distention.  Enlargement of her umbilical hernia with some discomfort.  She is gained about 15 pounds.  She has been wearing an abdominal corset which helps.  She is compliant with low-sodium diet.  She was seen by interventional radiology in follow-up July 08, 2019.  I have reviewed that encounter.  At that time she was doing well 3 months status post radiation segmentectomy of hepatic segment 7 lie RADS category 5 presumed hepatocellular carcinoma.  Recent MRI demonstrated no evidence of residual or recurrent disease.  She did have progressive ascites.  Continued surveillance with MRI and AFP ongoing.  She tells me that she is doing well otherwise.  She continues on pantoprazole.  No abdominal pain.  No melena.  She did NOT have varices on her endoscopy.  No issues with mentation.  Some mild  ankle edema.  Last blood work from June 02, 2019 shows normal comprehensive metabolic panel.  Normal CBC with hemoglobin 13.6.  White blood cell count slightly depressed at 3.7.  AFP 4.5.  The patient has received both of her COVID-19 vaccinations as of February 17  REVIEW OF SYSTEMS:  All non-GI ROS negative unless otherwise stated in the HPI except for sinus and allergies, hearing problems  Past Medical History:  Diagnosis Date  . ALLERGIC RHINITIS 01/12/2008  . Allergy   . Cancer [facility])    liver cancer hepatocellular carcinoma per pt   . Cataract    bilateral  . Colon polyps    hyperplastic  . Constipation    ongoing issues per pt  . Diabetes ([place])   . Esophageal varices ([place])   . Fatty liver   . GOITER, MULTINODULAR 01/12/2008  . Hepatic cirrhosis ([place])   . History of radiation therapy 11/20/2018   x 1 radiation for liver cancer  . Hypertension    under control  . HYPOTHYROIDISM 09/07/2008  . 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 Liver cirrhosis secondary to NASH ([place])   . Osteoporosis   . 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 Splenomegaly   . Thrombocytopenia ([place])    Appears chronic from at least 2014  . Vitamin D deficiency     Past Surgical History:  Procedure Laterality Date  . arthroscopic left knee  2005  . BREAST BIOPSY Left 2017  . BREAST EXCISIONAL BIOPSY Right 2008  . BREAST SURGERY  2008   Breast Biopsy   . CHOLECYSTECTOMY  1985  . COLONOSCOPY    . IR ANGIOGRAM SELECTIVE EACH ADDITIONAL VESSEL  11/03/2018  . IR ANGIOGRAM SELECTIVE EACH ADDITIONAL VESSEL  11/03/2018  . IR ANGIOGRAM SELECTIVE EACH ADDITIONAL VESSEL  11/03/2018  . IR ANGIOGRAM SELECTIVE EACH ADDITIONAL VESSEL  11/03/2018  . IR ANGIOGRAM SELECTIVE EACH ADDITIONAL VESSEL  11/20/2018  . IR ANGIOGRAM SELECTIVE EACH ADDITIONAL VESSEL  11/20/2018  . IR ANGIOGRAM SELECTIVE EACH ADDITIONAL VESSEL  11/20/2018  . IR ANGIOGRAM VISCERAL SELECTIVE  11/03/2018  .  IR ANGIOGRAM VISCERAL SELECTIVE  11/03/2018  . IR ANGIOGRAM VISCERAL SELECTIVE  11/03/2018  . IR ANGIOGRAM VISCERAL SELECTIVE  11/20/2018  . IR ANGIOGRAM VISCERAL SELECTIVE  11/20/2018  . IR EMBO TUMOR ORGAN ISCHEMIA INFARCT INC GUIDE ROADMAPPING  11/20/2018  . IR GENERIC HISTORICAL  04/02/2016   IR RADIOLOGIST EVAL & MGMT 04/02/2016 Jacqulynn Cadet, MD [facility] INTERV RAD  . IR PARACENTESIS  02/02/2018  . IR PARACENTESIS  04/20/2018  . IR PARACENTESIS  12/03/2018  . IR RADIOLOGIST EVAL & MGMT  10/29/2016  . IR RADIOLOGIST EVAL & MGMT  04/29/2017  . IR RADIOLOGIST EVAL & MGMT  11/04/2017  . IR RADIOLOGIST EVAL & MGMT  04/16/2018  . IR RADIOLOGIST EVAL & MGMT  12/08/2018  . IR RADIOLOGIST EVAL & MGMT  03/16/2019  . IR RADIOLOGIST EVAL & MGMT  07/08/2019  . IR US GUIDE VASC ACCESS LEFT  11/20/2018  . IR US GUIDE VASC ACCESS RIGHT  11/03/2018  . POLYPECTOMY    . UPPER GASTROINTESTINAL ENDOSCOPY      Social History Crystal Barnett  reports that she quit smoking about 29 years ago. She has never used smokeless tobacco. She reports that she does not drink alcohol or use drugs.  family history includes Breast cancer in her mother; Goiter in her maternal grandmother; Stroke (age of onset: 35) in her father.  No Known Allergies     PHYSICAL EXAMINATION: Vital signs: BP 140/80 (BP Location: Left Arm, Patient Position: Sitting, Cuff Size: Normal)   Pulse 76   Temp 97.9 F (36.6 C)   Ht 5' 4"  (1.626 m)   Wt 196 lb (88.9 kg)   BMI 33.64 kg/m   Constitutional: Pleasant, chronically ill-appearing, overweight, no acute distress Psychiatric: alert and oriented x3, cooperative Eyes: extraocular movements intact, anicteric, conjunctiva pink Mouth: oral pharynx moist, no lesions Neck: supple no lymphadenopathy Cardiovascular: heart regular rate and rhythm, no murmur Lungs: clear to auscultation bilaterally Abdomen: soft, nontender, significantly distended with obvious ascites, no peritoneal signs, normal  bowel sounds, no organomegaly.  Discolored umbilical hernia with mild discomfort to palpation Rectal: Omitted Extremities: no clubbing or cyanosis.  Chronic stasis changes bilaterally with 1+ lower extremity edema bilaterally Skin: no lesions on visible extremities Neuro: No focal deficits. No asterixis.    ASSESSMENT:  1.  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  MELD score 6 Ia.  Right liver mass suspected HCC status post radioablation therapy.  Recent follow-up without recurrence or progression 2.  Progressive ascites.  Symptomatic 3.  Umbilical hernia 4.  History of gastric ulceration associated with Helicobacter pylori.  Improved on PPI after H. pylori eradication therapy on follow-up endoscopy 5.  No esophageal varices on EGD 6.  GERD.  Symptoms controlled on PPI 7.  Obesity   PLAN:  1.  Schedule large-volume (up to 8 L) paracentesis with IV albumin replacement. 2.  Send fluid for cell count with differential and  cytology 3.  Increase furosemide from 40 mg daily to 60 mg daily.  Prescriptions adjusted and submitted electronically 4.  Increase Aldactone from 100 mg daily to 200 mg daily.  Prescriptions adjusted and submitted electronically 5.  Basic metabolic panel in 1 week 6.  Continue PPI 7.  Continue 2 g sodium diet 8.  Routine office follow-up 2 months 9.  Ongoing surveillance with interventional radiology 10.  Continue to wear abdominal corset across the hernia A total of 45 minutes was spent preparing to see the patient, reviewing test, reviewing x-rays, reviewing outside interventional radiology evaluation, obtaining history, performing comprehensive physical examination, counseling the patient regarding her complicated liver disease and gastric ulcer disease, ordering therapeutic procedure, blood work, and adjusting medications.  Finally documenting clinical information in the health record

## 2019-07-14 NOTE — Patient Instructions (Signed)
We have sent the following medications to your pharmacy for you to pick up at your convenience:  Increase your Lasix to 20m (1.5 tablets) daily and your Aldactone to 2022m(2 tablets) daily.  [facility]/2019 at 9:00am. Please arrive at least 15 minutes prior to your appointment time for registration. Should you need to reschedule this appointment for any reason, please call our office at [phone] Your provider has requested that you go to the basement level for lab work in one week. Press "B" on the elevator. The lab is located at the first door on the left as you exit the elevator.

## 2019-07-15 ENCOUNTER — Ambulatory Visit (HOSPITAL_COMMUNITY)
Admission: RE | Admit: 2019-07-15 | Discharge: 2019-07-15 | Disposition: A | Discharge status: Home / Self Care | Payer: Medicare Other | Source: Ambulatory Visit | Attending: Internal Medicine | Admitting: Internal Medicine

## 2019-07-15 ENCOUNTER — Other Ambulatory Visit: Payer: Self-pay

## 2019-07-15 DIAGNOSIS — R188 Other ascites: Secondary | ICD-10-CM | POA: Insufficient documentation

## 2019-07-15 DIAGNOSIS — K7460 Unspecified cirrhosis of liver: Secondary | ICD-10-CM | POA: Diagnosis not present

## 2019-07-15 DIAGNOSIS — K429 Umbilical hernia without obstruction or gangrene: Secondary | ICD-10-CM

## 2019-07-15 HISTORY — PX: IR PARACENTESIS: IMG2679

## 2019-07-15 LAB — BODY FLUID CELL COUNT WITH DIFFERENTIAL
Eos, Fluid: 0 %
Lymphs, Fluid: 44 %
Monocyte-Macrophage-Serous Fluid: 48 % — ABNORMAL LOW (ref 50–90)
Neutrophil Count, Fluid: 8 % (ref 0–25)
Total Nucleated Cell Count, Fluid: 505 cu mm (ref 0–1000)

## 2019-07-15 LAB — ALBUMIN, PLEURAL OR PERITONEAL FLUID: Albumin, Fluid: 2.5 g/dL

## 2019-07-15 MED ORDER — ALBUMIN HUMAN 25 % IV SOLN
50.0000 g | Freq: Once | INTRAVENOUS | Status: AC
  Administered 2019-07-15: 50 g via INTRAVENOUS

## 2019-07-15 MED ORDER — LIDOCAINE HCL 1 % IJ SOLN
INTRAMUSCULAR | Status: AC
  Filled 2019-07-15: qty 20

## 2019-07-15 MED ORDER — ALBUMIN HUMAN 25 % IV SOLN
INTRAVENOUS | Status: AC
  Filled 2019-07-15: qty 200

## 2019-07-15 MED ORDER — LIDOCAINE HCL (PF) 1 % IJ SOLN
INTRAMUSCULAR | Status: DC | PRN
  Administered 2019-07-15: 10 mL

## 2019-07-15 MED ORDER — LIDOCAINE-EPINEPHRINE (PF) 1 %-1:200000 IJ SOLN: INTRAMUSCULAR | Status: DC | PRN

## 2019-07-15 NOTE — Procedures (Signed)
Ultrasound-guided diagnostic and therapeutic paracentesis performed yielding 6.1 liters of turbid, yellow fluid. No immediate complications. A portion of the fluid was sent to the lab for preordered studies. EBL none. Pt will receive IV albumin postprocedure.

## 2019-07-16 ENCOUNTER — Telehealth: Payer: Self-pay | Admitting: Internal Medicine

## 2019-07-19 NOTE — Telephone Encounter (Signed)
Patient had trouble getting her insurance company to cover her PPI but was able to get them to approve it.  Patient indicated she will be in in a few days for labs as scheduled

## 2019-07-20 LAB — CYTOLOGY - NON PAP

## 2019-07-21 ENCOUNTER — Other Ambulatory Visit: Payer: Self-pay

## 2019-07-21 ENCOUNTER — Other Ambulatory Visit (INDEPENDENT_AMBULATORY_CARE_PROVIDER_SITE_OTHER): Payer: Medicare Other

## 2019-07-21 DIAGNOSIS — K7460 Unspecified cirrhosis of liver: Secondary | ICD-10-CM

## 2019-07-21 DIAGNOSIS — R188 Other ascites: Secondary | ICD-10-CM

## 2019-07-21 DIAGNOSIS — K429 Umbilical hernia without obstruction or gangrene: Secondary | ICD-10-CM | POA: Diagnosis not present

## 2019-07-21 LAB — BASIC METABOLIC PANEL
BUN: 23 mg/dL (ref 6–23)
CO2: 27 mEq/L (ref 19–32)
Calcium: 9.8 mg/dL (ref 8.4–10.5)
Chloride: 96 mEq/L (ref 96–112)
Creatinine, Ser: 0.97 mg/dL (ref 0.40–1.20)
GFR: 55.35 mL/min — ABNORMAL LOW (ref >60.00)
Glucose, Bld: 104 mg/dL — ABNORMAL HIGH (ref 70–99)
Potassium: 4.6 mEq/L (ref 3.5–5.1)
Sodium: 133 mEq/L — ABNORMAL LOW (ref 135–145)

## 2019-07-28 ENCOUNTER — Other Ambulatory Visit (INDEPENDENT_AMBULATORY_CARE_PROVIDER_SITE_OTHER): Payer: Medicare Other

## 2019-07-28 ENCOUNTER — Other Ambulatory Visit: Payer: Self-pay | Admitting: *Deleted

## 2019-07-28 DIAGNOSIS — K7460 Unspecified cirrhosis of liver: Secondary | ICD-10-CM | POA: Diagnosis not present

## 2019-07-28 DIAGNOSIS — R188 Other ascites: Secondary | ICD-10-CM | POA: Diagnosis not present

## 2019-07-28 LAB — BASIC METABOLIC PANEL
BUN: 21 mg/dL (ref 6–23)
CO2: 29 mEq/L (ref 19–32)
Calcium: 9.8 mg/dL (ref 8.4–10.5)
Chloride: 96 mEq/L (ref 96–112)
Creatinine, Ser: 0.92 mg/dL (ref 0.40–1.20)
GFR: 58.83 mL/min — ABNORMAL LOW (ref >60.00)
Glucose, Bld: 99 mg/dL (ref 70–99)
Potassium: 4.4 mEq/L (ref 3.5–5.1)
Sodium: 132 mEq/L — ABNORMAL LOW (ref 135–145)

## 2019-08-11 ENCOUNTER — Other Ambulatory Visit: Payer: Self-pay

## 2019-08-11 ENCOUNTER — Other Ambulatory Visit (INDEPENDENT_AMBULATORY_CARE_PROVIDER_SITE_OTHER): Payer: Medicare Other

## 2019-08-11 DIAGNOSIS — K7460 Unspecified cirrhosis of liver: Secondary | ICD-10-CM

## 2019-08-11 LAB — BASIC METABOLIC PANEL
BUN: 23 mg/dL (ref 6–23)
CO2: 27 mEq/L (ref 19–32)
Calcium: 10 mg/dL (ref 8.4–10.5)
Chloride: 94 mEq/L — ABNORMAL LOW (ref 96–112)
Creatinine, Ser: 1.01 mg/dL (ref 0.40–1.20)
GFR: 52.82 mL/min — ABNORMAL LOW (ref >60.00)
Glucose, Bld: 97 mg/dL (ref 70–99)
Potassium: 4.6 mEq/L (ref 3.5–5.1)
Sodium: 129 mEq/L — ABNORMAL LOW (ref 135–145)

## 2019-08-18 ENCOUNTER — Other Ambulatory Visit (INDEPENDENT_AMBULATORY_CARE_PROVIDER_SITE_OTHER): Payer: Medicare Other

## 2019-08-18 ENCOUNTER — Other Ambulatory Visit: Payer: Self-pay

## 2019-08-18 DIAGNOSIS — K7460 Unspecified cirrhosis of liver: Secondary | ICD-10-CM

## 2019-08-18 DIAGNOSIS — R188 Other ascites: Secondary | ICD-10-CM | POA: Diagnosis not present

## 2019-08-18 LAB — BASIC METABOLIC PANEL
BUN: 29 mg/dL — ABNORMAL HIGH (ref 6–23)
CO2: 24 mEq/L (ref 19–32)
Calcium: 9.9 mg/dL (ref 8.4–10.5)
Chloride: 96 mEq/L (ref 96–112)
Creatinine, Ser: 1.1 mg/dL (ref 0.40–1.20)
GFR: 47.86 mL/min — ABNORMAL LOW (ref >60.00)
Glucose, Bld: 96 mg/dL (ref 70–99)
Potassium: 4.6 mEq/L (ref 3.5–5.1)
Sodium: 130 mEq/L — ABNORMAL LOW (ref 135–145)

## 2019-09-01 ENCOUNTER — Other Ambulatory Visit (INDEPENDENT_AMBULATORY_CARE_PROVIDER_SITE_OTHER): Payer: Medicare Other

## 2019-09-01 ENCOUNTER — Other Ambulatory Visit: Payer: Self-pay

## 2019-09-01 DIAGNOSIS — R188 Other ascites: Secondary | ICD-10-CM

## 2019-09-01 DIAGNOSIS — K7460 Unspecified cirrhosis of liver: Secondary | ICD-10-CM

## 2019-09-01 LAB — BASIC METABOLIC PANEL
BUN: 26 mg/dL — ABNORMAL HIGH (ref 6–23)
CO2: 26 mEq/L (ref 19–32)
Calcium: 9.8 mg/dL (ref 8.4–10.5)
Chloride: 98 mEq/L (ref 96–112)
Creatinine, Ser: 0.98 mg/dL (ref 0.40–1.20)
GFR: 54.68 mL/min — ABNORMAL LOW (ref >60.00)
Glucose, Bld: 92 mg/dL (ref 70–99)
Potassium: 4.6 mEq/L (ref 3.5–5.1)
Sodium: 134 mEq/L — ABNORMAL LOW (ref 135–145)

## 2019-09-03 ENCOUNTER — Other Ambulatory Visit: Payer: Self-pay | Admitting: Interventional Radiology

## 2019-09-03 ENCOUNTER — Other Ambulatory Visit: Payer: Self-pay

## 2019-09-03 DIAGNOSIS — C220 Liver cell carcinoma: Secondary | ICD-10-CM

## 2019-09-21 ENCOUNTER — Ambulatory Visit (INDEPENDENT_AMBULATORY_CARE_PROVIDER_SITE_OTHER): Payer: Medicare Other | Admitting: Internal Medicine

## 2019-09-21 ENCOUNTER — Encounter: Payer: Self-pay | Admitting: Internal Medicine

## 2019-09-21 VITALS — BP 130/60 | HR 78 | Temp 98.4°F | Ht 64.25 in | Wt 170.0 lb

## 2019-09-21 DIAGNOSIS — R188 Other ascites: Secondary | ICD-10-CM | POA: Diagnosis not present

## 2019-09-21 DIAGNOSIS — K429 Umbilical hernia without obstruction or gangrene: Secondary | ICD-10-CM | POA: Diagnosis not present

## 2019-09-21 DIAGNOSIS — K7460 Unspecified cirrhosis of liver: Secondary | ICD-10-CM | POA: Diagnosis not present

## 2019-09-21 DIAGNOSIS — K253 Acute gastric ulcer without hemorrhage or perforation: Secondary | ICD-10-CM | POA: Diagnosis not present

## 2019-09-21 DIAGNOSIS — K7581 Nonalcoholic steatohepatitis (NASH): Secondary | ICD-10-CM | POA: Diagnosis not present

## 2019-09-21 DIAGNOSIS — R932 Abnormal findings on diagnostic imaging of liver and biliary tract: Secondary | ICD-10-CM

## 2019-09-21 NOTE — Progress Notes (Signed)
HISTORY OF PRESENT ILLNESS:  Crystal Barnett is a 79 y.o. 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  Last evaluated July 14, 2019.  See that dictation.  At that time problems with progressive ascites and symptomatic umbilical hernia.  She underwent 6.1 L paracentesis.  No SBP.  Diuretics were increased.  Currently on Lasix 60 mg daily and Aldactone 200 mg daily.  Basic metabolic panels have been stable.  She is adherent to 2 g sodium diet.  She presents today for routine follow-up.  Since her last visit she is pleased to report that she has lost 26 pounds.  Her abdomen is less distended.  She is wearing an abdominal corset.  She is still bothered by the hernia, but less.  No evidence for GI bleeding, issues with mentation, or significant peripheral edema.  She is due for follow-up MRI of the liver Friday, May 21.  Her last upper endoscopy November 2020.  No varices.  Healing gastric ulcers.  Last basic metabolic panel September 01, 6466.  Creatinine 0.98.  Sodium 134.  Potassium 4.6.  REVIEW OF SYSTEMS:  All non-GI ROS negative unless otherwise stated in the HPI except for hearing problems  Past Medical History:  Diagnosis Date  . ALLERGIC RHINITIS 01/12/2008  . Allergy   . Cancer [facility])    liver cancer hepatocellular carcinoma per pt   . Cataract    bilateral  . Colon polyps    hyperplastic  . Constipation    ongoing issues per pt  . Diabetes ([place])   . Esophageal varices ([place])   . Fatty liver   . GOITER, MULTINODULAR 01/12/2008  . Hepatic cirrhosis ([place])   . History of radiation therapy 11/20/2018   x 1 radiation for liver cancer  . Hypertension    under control  . HYPOTHYROIDISM 09/07/2008  . 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 Liver cirrhosis  secondary to NASH ([place])   . Osteoporosis   . 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 Splenomegaly   . Thrombocytopenia ([place])    Appears chronic from at least 2014  . Vitamin D deficiency     Past Surgical History:  Procedure Laterality Date  . arthroscopic left knee  2005  . BREAST BIOPSY Left 2017  . BREAST EXCISIONAL BIOPSY Right 2008  . BREAST SURGERY  2008   Breast Biopsy   . CHOLECYSTECTOMY  1985  . COLONOSCOPY    . IR ANGIOGRAM SELECTIVE EACH ADDITIONAL VESSEL  11/03/2018  . IR ANGIOGRAM SELECTIVE EACH ADDITIONAL VESSEL  11/03/2018  . IR ANGIOGRAM SELECTIVE EACH ADDITIONAL VESSEL  11/03/2018  . IR ANGIOGRAM SELECTIVE EACH ADDITIONAL VESSEL  11/03/2018  . IR ANGIOGRAM SELECTIVE EACH ADDITIONAL VESSEL  11/20/2018  . IR ANGIOGRAM SELECTIVE EACH ADDITIONAL VESSEL  11/20/2018  . IR ANGIOGRAM SELECTIVE EACH ADDITIONAL VESSEL  11/20/2018  . IR ANGIOGRAM VISCERAL SELECTIVE  11/03/2018  . IR ANGIOGRAM VISCERAL SELECTIVE  11/03/2018  . IR ANGIOGRAM VISCERAL SELECTIVE  11/03/2018  . IR ANGIOGRAM VISCERAL SELECTIVE  11/20/2018  . IR ANGIOGRAM VISCERAL SELECTIVE  11/20/2018  . IR EMBO TUMOR ORGAN ISCHEMIA INFARCT INC GUIDE ROADMAPPING  11/20/2018  . IR GENERIC HISTORICAL  04/02/2016   IR RADIOLOGIST EVAL & MGMT 04/02/2016 Jacqulynn Cadet, MD [facility] INTERV RAD  .  IR PARACENTESIS  02/02/2018  . IR PARACENTESIS  04/20/2018  . IR PARACENTESIS  12/03/2018  . IR PARACENTESIS  07/15/2019  . IR RADIOLOGIST EVAL & MGMT  10/29/2016  . IR RADIOLOGIST EVAL & MGMT  04/29/2017  . IR RADIOLOGIST EVAL & MGMT  11/04/2017  . IR RADIOLOGIST EVAL & MGMT  04/16/2018  . IR RADIOLOGIST EVAL & MGMT  12/08/2018  . IR RADIOLOGIST EVAL & MGMT  03/16/2019  . IR RADIOLOGIST EVAL & MGMT  07/08/2019  . IR US GUIDE VASC ACCESS LEFT  11/20/2018  . IR US GUIDE VASC ACCESS RIGHT  11/03/2018  . POLYPECTOMY    . UPPER GASTROINTESTINAL ENDOSCOPY      Social History MARKIYAH GAHM  reports that she quit smoking about 29 years ago. She has  never used smokeless tobacco. She reports that she does not drink alcohol or use drugs.  family history includes Breast cancer in her mother; Goiter in her maternal grandmother; Stroke (age of onset: 71) in her father.  No Known Allergies     PHYSICAL EXAMINATION: Vital signs: BP 130/60   Pulse 78   Temp 98.4 F (36.9 C)   Ht 5' 4.25" (1.632 m)   Wt 170 lb (77.1 kg)   BMI 28.95 kg/m   Constitutional: Pleasant, no acute distress Psychiatric: alert and oriented x3, cooperative Eyes: extraocular movements intact, anicteric, conjunctiva pink Mouth: oral pharynx moist, no lesions Neck: supple no lymphadenopathy Cardiovascular: heart regular rate and rhythm, no murmur Lungs: clear to auscultation bilaterally Abdomen: soft, nontender, nondistended, no obvious ascites, no peritoneal signs, normal bowel sounds, no organomegaly.  Mildly tender umbilical hernia with blue issue.  Nontense.  Proved from last exam Rectal: Omitted Extremities: no clubbing or cyanosis.  Trace lower extremity edema bilaterally.  Chronic stasis changes Skin: no lesions on visible extremities Neuro: No focal deficits. No asterixis.    ASSESSMENT:  1.  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  MELD score 6 Ia.  Right liver mass suspected HCC status post radioablation therapy.  Recent follow-up without recurrence or progression 2.  Ascites.  Improved post paracentesis on increased diuretic regimen 3.  Umbilical hernia 4.  History of gastric ulceration associated with Helicobacter pylori.  Improved on PPI after H. pylori eradication therapy on follow-up endoscopy 5.  No esophageal varices on EGD 6.  GERD.  Symptoms controlled on PPI 7.  Obesity   PLAN:  1.    Continue furosemide 60 mg daily.  Prescriptions adjusted and submitted electronically 2.    Continue Aldactone 200 mg daily.  Prescriptions adjusted and submitted electronically 3.  Basic metabolic panel in 2 weeks 4.  Continue PPI 5.   Continue 2 g sodium diet 6.  Routine office follow-up 6 months 7.  Ongoing surveillance [facility] with interventional radiology 8.  Continue to wear abdominal corset across the hernia

## 2019-09-21 NOTE — Patient Instructions (Signed)
Please follow up in 6 months (November)

## 2019-09-29 ENCOUNTER — Other Ambulatory Visit (HOSPITAL_COMMUNITY)
Admission: RE | Admit: 2019-09-29 | Discharge: 2019-09-29 | Disposition: A | Discharge status: Home / Self Care | Payer: Medicare Other | Source: Ambulatory Visit | Attending: Interventional Radiology | Admitting: Interventional Radiology

## 2019-09-29 DIAGNOSIS — C220 Liver cell carcinoma: Secondary | ICD-10-CM | POA: Insufficient documentation

## 2019-09-29 LAB — COMPREHENSIVE METABOLIC PANEL
ALT: 28 U/L (ref 0–44)
AST: 37 U/L (ref 15–41)
Albumin: 4.3 g/dL (ref 3.5–5.0)
Alkaline Phosphatase: 78 U/L (ref 38–126)
Anion gap: 13 (ref 5–15)
BUN: 27 mg/dL — ABNORMAL HIGH (ref 8–23)
CO2: 23 mmol/L (ref 22–32)
Calcium: 9.7 mg/dL (ref 8.9–10.3)
Chloride: 100 mmol/L (ref 98–111)
Creatinine, Ser: 0.98 mg/dL (ref 0.44–1.00)
GFR calc Af Amer: >60 mL/min (ref >60)
GFR calc non Af Amer: 55 mL/min — ABNORMAL LOW (ref >60)
Glucose, Bld: 98 mg/dL (ref 70–99)
Potassium: 4.6 mmol/L (ref 3.5–5.1)
Sodium: 136 mmol/L (ref 135–145)
Total Bilirubin: 1.2 mg/dL (ref 0.3–1.2)
Total Protein: 7.4 g/dL (ref 6.5–8.1)

## 2019-09-29 LAB — PROTIME-INR
INR: 1.1 (ref 0.8–1.2)
Prothrombin Time: 13.4 seconds (ref 11.4–15.2)

## 2019-09-29 LAB — CBC
HCT: 41.9 % (ref 36.0–46.0)
Hemoglobin: 14.1 g/dL (ref 12.0–15.0)
MCH: 30.5 pg (ref 26.0–34.0)
MCHC: 33.7 g/dL (ref 30.0–36.0)
MCV: 90.5 fL (ref 80.0–100.0)
Platelets: 131 10*3/uL — ABNORMAL LOW (ref 150–400)
RBC: 4.63 MIL/uL (ref 3.87–5.11)
RDW: 14 % (ref 11.5–15.5)
WBC: 3.7 10*3/uL — ABNORMAL LOW (ref 4.0–10.5)
nRBC: 0 % (ref 0.0–0.2)

## 2019-09-30 LAB — AFP TUMOR MARKER: AFP, Serum, Tumor Marker: 4.3 ng/mL (ref 0.0–8.3)

## 2019-10-01 ENCOUNTER — Ambulatory Visit (HOSPITAL_COMMUNITY)
Admission: RE | Admit: 2019-10-01 | Discharge: 2019-10-01 | Disposition: A | Discharge status: Home / Self Care | Payer: Medicare Other | Source: Ambulatory Visit | Attending: Interventional Radiology | Admitting: Interventional Radiology

## 2019-10-01 ENCOUNTER — Other Ambulatory Visit: Payer: Self-pay

## 2019-10-01 ENCOUNTER — Telehealth: Payer: Self-pay

## 2019-10-01 DIAGNOSIS — C220 Liver cell carcinoma: Secondary | ICD-10-CM | POA: Diagnosis not present

## 2019-10-01 MED ORDER — GADOXETATE DISODIUM 0.25 MMOL/ML IV SOLN
8.0000 mL | Freq: Once | INTRAVENOUS | Status: AC | PRN
  Administered 2019-10-01: 8 mL via INTRAVENOUS

## 2019-10-01 NOTE — Telephone Encounter (Signed)
Patient is returning your call.  

## 2019-10-01 NOTE — Telephone Encounter (Signed)
Patient calling you back

## 2019-10-01 NOTE — Telephone Encounter (Signed)
Left message for pt to call back  °

## 2019-10-01 NOTE — Telephone Encounter (Signed)
Spoke with patient, reminded pt of upcoming lab work. Pt states that she will go to the lab on Monday for BMET.

## 2019-10-01 NOTE — Telephone Encounter (Signed)
-----   Message from Algernon Huxley, RN sent at 09/01/2019  2:03 PM EDT ----- Regarding: BMET Pt needs labs, order in epic

## 2019-10-01 NOTE — Telephone Encounter (Signed)
Left message on pt's voice-mail.

## 2019-10-04 ENCOUNTER — Other Ambulatory Visit (INDEPENDENT_AMBULATORY_CARE_PROVIDER_SITE_OTHER): Payer: Medicare Other

## 2019-10-04 DIAGNOSIS — R188 Other ascites: Secondary | ICD-10-CM

## 2019-10-04 DIAGNOSIS — K7460 Unspecified cirrhosis of liver: Secondary | ICD-10-CM

## 2019-10-04 LAB — BASIC METABOLIC PANEL
BUN: 28 mg/dL — ABNORMAL HIGH (ref 6–23)
CO2: 27 mEq/L (ref 19–32)
Calcium: 9.7 mg/dL (ref 8.4–10.5)
Chloride: 101 mEq/L (ref 96–112)
Creatinine, Ser: 1.01 mg/dL (ref 0.40–1.20)
GFR: 52.8 mL/min — ABNORMAL LOW (ref >60.00)
Glucose, Bld: 91 mg/dL (ref 70–99)
Potassium: 4.7 mEq/L (ref 3.5–5.1)
Sodium: 135 mEq/L (ref 135–145)

## 2019-10-05 ENCOUNTER — Telehealth: Payer: Self-pay | Admitting: Internal Medicine

## 2019-10-05 NOTE — Telephone Encounter (Signed)
Let pt know that Dr. Henrene Pastor is out of the office and she will receive a call once he returns regarding the results.

## 2019-10-07 ENCOUNTER — Encounter: Payer: Self-pay | Admitting: *Deleted

## 2019-10-07 ENCOUNTER — Other Ambulatory Visit: Payer: Self-pay

## 2019-10-07 ENCOUNTER — Ambulatory Visit
Admission: RE | Admit: 2019-10-07 | Discharge: 2019-10-07 | Disposition: A | Discharge status: Home / Self Care | Payer: Medicare Other | Source: Ambulatory Visit | Attending: Interventional Radiology | Admitting: Interventional Radiology

## 2019-10-07 DIAGNOSIS — Z8505 Personal history of malignant neoplasm of liver: Secondary | ICD-10-CM | POA: Diagnosis not present

## 2019-10-07 DIAGNOSIS — C220 Liver cell carcinoma: Secondary | ICD-10-CM

## 2019-10-07 HISTORY — PX: IR RADIOLOGIST EVAL & MGMT: IMG5224

## 2019-10-07 NOTE — Progress Notes (Signed)
Interventional radiology progress note   Chief Complaint: Patient was seen in follow-up remotely today (TeleHealth) for hepatocellular cancer at the request of [place].    Referring Physician(s): Dr. Irene Limbo  History of Present Illness: Crystal Barnett is a 79 y.o. 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 MRI imaging was performed first in December 2016 to evaluate a region of possible concern in the left hepatic lobe. On that study, there was a small 9 mm enhancing focus in hepatic segment 7 in the posterosuperior aspect of the hepatic dome. This subsequently been followed at 6 month intervals with repeat MRI scans of the abdomen.  MRI from 12/5/19demonstrates more definitive enlargement of the lesion now measuring up to 1.9 cm. Additionally, there appears to be an enhancing pseudocapsule. On the present study, the imaging characteristics are consistent with a Li-RADS 5 lesion which is diagnostic of hepatocellular carcinoma. The very slow growth rate over the past 3 years suggests a low-grade or indolent malignancy at this time.  Due to the presence of ascites and the location of the lesion high in the right dome just under the lung, we elected to proceed with transarterial radio embolization segmentectomy(8)which was performed on 11/17/2018.  Today, we are having our 85-monthpost procedure follow-up consultation via telemedicine. Crystal Barnett doing well. She denies fatigue, abdominal pain, nausea, vomiting or other clinical symptoms.   MRI 03/11/19: Treated segment 7 right liver mass is decreased in size and demonstrates no internal solid enhancement to suggest residual or recurrent viability. Geographic signal intensity changes at the liver dome surrounding the treated lesion, favor post treatment change. No new liver masses. Continued MRI surveillance recommended.  MRI 07/05/19: Stable appearance of treated segment 7  right liver lesion which is decreased in size without internal enhancement. No new liver Lesions.  MRI 10/01/19: Unchanged ablation site of the liver dome, hepatic segment VII, without residual contrast enhancement. No evidence of viability or recurrence. LI-RADS 5T. No new suspicious liver lesions.  Past Medical History:  Diagnosis Date  . ALLERGIC RHINITIS 01/12/2008  . Allergy   . Cancer ([facility]    liver cancer hepatocellular carcinoma per pt   . Cataract    bilateral  . Colon polyps    hyperplastic  . Constipation    ongoing issues per pt  . Diabetes ([place]   . Esophageal varices ([place]   . Fatty liver   . GOITER, MULTINODULAR 01/12/2008  . Hepatic cirrhosis ([place]   . History of radiation therapy 11/20/2018   x 1 radiation for liver cancer  . Hypertension    under control  . HYPOTHYROIDISM 09/07/2008  . 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 Liver cirrhosis secondary to NASH ([place]   . Osteoporosis   . 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 Splenomegaly   . [place]rombocytopenia ([place]    Appears chronic from at least 2014  . Vitamin D deficiency     Past Surgical History:  Procedure Laterality Date  . arthroscopic left knee  2005  . BREAST BIOPSY Left 2017  . BREAST EXCISIONAL BIOPSY Right 2008  . BREAST SURGERY  2008   Breast Biopsy   . CHOLECYSTECTOMY  1985  . COLONOSCOPY    . IR ANGIOGRAM SELECTIVE EACH ADDITIONAL VESSEL  11/03/2018  . IR ANGIOGRAM SELECTIVE EACH ADDITIONAL VESSEL  11/03/2018  . IR ANGIOGRAM SELECTIVE EACH ADDITIONAL VESSEL  11/03/2018  . [place]  ADDITIONAL VESSEL  11/03/2018  . IR ANGIOGRAM SELECTIVE EACH ADDITIONAL VESSEL  11/20/2018  . IR ANGIOGRAM SELECTIVE EACH ADDITIONAL VESSEL  11/20/2018  . IR ANGIOGRAM SELECTIVE EACH ADDITIONAL VESSEL  11/20/2018  . IR ANGIOGRAM VISCERAL SELECTIVE  11/03/2018  . IR ANGIOGRAM VISCERAL SELECTIVE  11/03/2018  . IR ANGIOGRAM VISCERAL SELECTIVE  11/03/2018  . IR  ANGIOGRAM VISCERAL SELECTIVE  11/20/2018  . IR ANGIOGRAM VISCERAL SELECTIVE  11/20/2018  . IR EMBO TUMOR ORGAN ISCHEMIA INFARCT INC GUIDE ROADMAPPING  11/20/2018  . IR GENERIC HISTORICAL  04/02/2016   IR RADIOLOGIST EVAL & MGMT 04/02/2016 Jacqulynn Cadet, MD [facility] INTERV RAD  . IR PARACENTESIS  02/02/2018  . IR PARACENTESIS  04/20/2018  . IR PARACENTESIS  12/03/2018  . IR PARACENTESIS  07/15/2019  . IR RADIOLOGIST EVAL & MGMT  10/29/2016  . IR RADIOLOGIST EVAL & MGMT  04/29/2017  . IR RADIOLOGIST EVAL & MGMT  11/04/2017  . IR RADIOLOGIST EVAL & MGMT  04/16/2018  . IR RADIOLOGIST EVAL & MGMT  12/08/2018  . IR RADIOLOGIST EVAL & MGMT  03/16/2019  . IR RADIOLOGIST EVAL & MGMT  07/08/2019  . IR US GUIDE VASC ACCESS LEFT  11/20/2018  . IR US GUIDE VASC ACCESS RIGHT  11/03/2018  . POLYPECTOMY    . UPPER GASTROINTESTINAL ENDOSCOPY      Allergies: Patient has no known allergies.  Medications: Prior to Admission medications   Medication Sig Start Date End Date Taking? Authorizing Provider  alendronate (FOSAMAX) 70 MG tablet Take 70 mg by mouth once a week. On Saturday 03/24/17   [provider]  Blood Glucose Monitoring Suppl ([place]) w/Device KIT OneTouch Verio System  USE TO TEST ONCE D UTD    [provider]  Calcium Carbonate-Vit D-Min (CALCIUM 1200 PO) Take 1,200 mg by mouth every other day.    [provider]  Cholecalciferol (VITAMIN D) 50 MCG (2000 UT) CAPS Take 2,000 Units by mouth at bedtime.    [provider]  fluticasone (FLONASE) 50 MCG/ACT nasal spray Place 1 spray into both nostrils daily.     [provider]  furosemide (LASIX) 40 MG tablet Take 1.5 tablets (60 mg total) by mouth daily. 07/14/19   Irene Shipper, MD  Lactobacillus (PROBIOTIC ACIDOPHILUS) CAPS Take 1 capsule by mouth 2 (two) times a week.     [provider]  levothyroxine (SYNTHROID) 50 MCG tablet TAKE 1 TABLET EVERY DAY 06/08/19   Renato Shin, MD   metFORMIN (GLUCOPHAGE) 500 MG tablet Take 500 mg by mouth every evening. Take 1 tablet by mouth once daily     [provider]  montelukast (SINGULAIR) 10 MG tablet Take 10 mg by mouth at bedtime.  03/25/14   [provider]  Multiple Vitamin (MULTIVITAMIN) tablet Take 1 tablet by mouth at bedtime.     [provider]  pantoprazole (PROTONIX) 40 MG tablet Take 1 tablet (40 mg total) by mouth 2 (two) times daily. 01/22/19   Irene Shipper, MD  spironolactone (ALDACTONE) 100 MG tablet Take 2 tablets (200 mg total) by mouth daily. 07/14/19   Irene Shipper, MD  vitamin C (ASCORBIC ACID) 500 MG tablet Take 500 mg by mouth every other day.    [provider]     Family History  Problem Relation Age of Onset  . Goiter Maternal Grandmother   . Stroke Father 31  . Breast cancer Mother        in  70's  . Colon cancer Neg Hx   . Colon polyps Neg Hx   . Esophageal cancer Neg Hx   . Stomach cancer Neg Hx   . Rectal cancer Neg Hx     Social History   Socioeconomic History  . Marital status: Single    Spouse name: Not on file  . Number of children: 1  . Years of education: Not on file  . Highest education level: Not on file  Occupational History  . Occupation: retired    Comment: works in Chiropodist for [employer]  Tobacco Use  . Smoking status: Former Smoker    Quit date: 05/13/1990    Years since quitting: 29.4  . Smokeless tobacco: Never Used  Substance and Sexual Activity  . Alcohol use: No    Alcohol/week: 0.0 standard drinks  . Drug use: No  . Sexual activity: Not on file  Other Topics Concern  . Not on file  Social History Narrative  . Not on file   Social Determinants of Health   Financial Resource Strain:   . Difficulty of Paying Living Expenses:   Food Insecurity:   . Worried About Charity fundraiser in the Last Year:   . Arboriculturist in the Last Year:   Transportation Needs:   . Film/video editor (Medical):   Marland Kitchen  Lack of Transportation (Non-Medical):   Physical Activity:   . Days of Exercise per Week:   . Minutes of Exercise per Session:   Stress:   . Feeling of Stress :   Social Connections:   . Frequency of Communication with Friends and Family:   . Frequency of Social Gatherings with Friends and Family:   . Attends Religious Services:   . Active Member of Clubs or Organizations:   . Attends Archivist Meetings:   Marland Kitchen Marital Status:     ECOG Status: 0 - Asymptomatic  Review of Systems  Review of Systems: A 12 point ROS discussed and pertinent positives are indicated in the HPI above.  All other systems are negative.  Physical Exam No direct physical exam was performed (except for noted visual exam findings with Video Visits).    Vital Signs: There were no vitals taken for this visit.  Imaging: MR ABDOMEN WWO CONTRAST  Result Date: 10/01/2019 CLINICAL DATA:  Follow-up hepatocellular carcinoma, status post ablation EXAM: MRI ABDOMEN WITHOUT AND WITH CONTRAST TECHNIQUE: Multiplanar multisequence MR imaging of the abdomen was performed both before and after the administration of intravenous contrast. CONTRAST:  32m EOVIST GADOXETATE DISODIUM 0.25 MOL/L IV SOLN COMPARISON:  07/05/2019 FINDINGS: Lower chest: No acute findings. Hepatobiliary: Cirrhotic morphology of the liver. Unchanged ablation site of the liver dome, hepatic segment VII, measuring 1.0 cm without residual contrast enhancement (series 11, image 23). Tiny focus of arterial phase hyperenhancement in the anterior left lobe of the liver, hepatic segment II, measuring approximately 5 mm is again noted and unchanged compared to examination dated 03/11/2019, again favored to represent a small transient hepatic intensity difference (series 11, image 29). Pancreas: No mass, inflammatory changes, or other parenchymal abnormality identified. Spleen:  Mild splenomegaly, maximum coronal span 14 cm. Adrenals/Urinary Tract: No masses  identified. No evidence of hydronephrosis. Stomach/Bowel: Small gastroesophageal varices. Visualized portions within the abdomen are otherwise unremarkable. Vascular/Lymphatic: No pathologically enlarged lymph nodes identified. No abdominal aortic aneurysm demonstrated. Other:  Moderate volume ascites. Musculoskeletal: No suspicious bone lesions identified. IMPRESSION: 1. Unchanged ablation site of the liver dome,  hepatic segment VII, without residual contrast enhancement. No evidence of viability or recurrence. LI-RADS 5T. 2. No new suspicious liver lesions. 3. Morphologic stigmata of cirrhosis, ascites, splenomegaly, and small gastroesophageal varices. Electronically Signed   By: Eddie Candle M.D.   On: 10/01/2019 10:54    Labs:  CBC: Recent Labs    01/04/19 1424 03/10/19 0939 06/02/19 1355 09/29/19 1050  WBC 4.1 3.4* 3.7* 3.7*  HGB 13.9 13.9 13.6 14.1  HCT 41.5 41.7 39.8 41.9  PLT 127.0* 110* 133* 131*    COAGS: Recent Labs    11/03/18 0829 11/03/18 0829 11/20/18 0817 12/04/18 1020 01/04/19 1424 03/10/19 0939 07/02/19 1345 09/29/19 1050  INR 1.1   < > 1.1   < > 1.2* 1.1 1.1 1.1  APTT 28  --  31  --   --   --   --   --    < > = values in this interval not displayed.    BMP: Recent Labs    12/04/18 1020 01/04/19 1424 03/10/19 0939 04/06/19 0916 06/02/19 1355 06/08/19 0939 08/18/19 0914 09/01/19 0918 09/29/19 1050 10/04/19 0920  NA 136   < > 135   < > 138   < > 130* 134* 136 135  K 4.4   < > 4.4   < > 4.5   < > 4.6 4.6 4.6 4.7  CL 103   < > 101   < > 103   < > 96 98 100 101  CO2 26   < > 24   < > 24   < > 24 26 23 27   GLUCOSE 90   < > 94   < > 84   < > 96 92 98 91  BUN 17   < > 21   < > 14   < > 29* 26* 27* 28*  CALCIUM 9.2   < > 9.0   < > 9.0   < > 9.9 9.8 9.7 9.7  CREATININE 0.71   < > 0.84   < > 0.82   < > 1.10 0.98 0.98 1.01  GFRNONAA >60  --  >60  --  >60  --   --   --  55*  --   GFRAA >60  --  >60  --  >60  --   --   --  >60  --    < > = values in this  interval not displayed.    LIVER FUNCTION TESTS: Recent Labs    01/04/19 1424 03/10/19 0939 06/02/19 1355 09/29/19 1050  BILITOT 0.5 1.0 0.7 1.2  AST 27 30 26  37  ALT 20 24 19 28   ALKPHOS 86 63 101 78  PROT 7.0 7.0 7.4 7.4  ALBUMIN 4.2 4.1 3.9 4.3    TUMOR MARKERS: No results for input(s): AFPTM, CEA, CA199, CHROMGRNA in the last 8760 hours.  Assessment and Plan:  79 year old female doing extremely well 9 months status post hepatic segment 7 segmentectomy for treatment of hepatocellular carcinoma.  Clinically, she is doing very well and remains at her baseline.  Imaging surveillance demonstrates a complete response to therapy with no evidence of residual, recurrent or new disease.  1.)  Continue active surveillance.  Next follow-up MRI and accompanying clinic visit in 3 months.  No labs required for this visit.  Thank you for this interesting consult.  I greatly enjoyed meeting Crystal Barnett and look forward to participating in their care.  A copy of this report was  sent to the requesting provider on this date.  Electronically Signed: Jacqulynn Cadet 10/07/2019, 2:18 PM   I spent a total of  10 Minutes in remote  clinical consultation, greater than 50% of which was counseling/coordinating care for hepatocellular carcinoma.    Visit type: Audio and video (patient preference).   Alternative for in-person consultation at [facility], [address], [place], [place]. This visit type was conducted due to national recommendations for restrictions regarding the COVID-19 Pandemic (e.g. social distancing).  This format is felt to be most appropriate for this patient at this time.  All issues noted in this document were discussed and addressed.

## 2019-10-12 ENCOUNTER — Other Ambulatory Visit: Payer: Self-pay

## 2019-10-12 DIAGNOSIS — K7460 Unspecified cirrhosis of liver: Secondary | ICD-10-CM

## 2019-10-21 DIAGNOSIS — Z0000 Encounter for general adult medical examination without abnormal findings: Secondary | ICD-10-CM | POA: Diagnosis not present

## 2019-10-21 DIAGNOSIS — J302 Other seasonal allergic rhinitis: Secondary | ICD-10-CM | POA: Diagnosis not present

## 2019-10-21 DIAGNOSIS — M810 Age-related osteoporosis without current pathological fracture: Secondary | ICD-10-CM | POA: Diagnosis not present

## 2019-10-21 DIAGNOSIS — C220 Liver cell carcinoma: Secondary | ICD-10-CM | POA: Diagnosis not present

## 2019-10-21 DIAGNOSIS — E039 Hypothyroidism, unspecified: Secondary | ICD-10-CM | POA: Diagnosis not present

## 2019-10-21 DIAGNOSIS — I10 Essential (primary) hypertension: Secondary | ICD-10-CM | POA: Diagnosis not present

## 2019-10-21 DIAGNOSIS — R188 Other ascites: Secondary | ICD-10-CM | POA: Diagnosis not present

## 2019-10-21 DIAGNOSIS — E119 Type 2 diabetes mellitus without complications: Secondary | ICD-10-CM | POA: Diagnosis not present

## 2019-10-21 DIAGNOSIS — D696 Thrombocytopenia, unspecified: Secondary | ICD-10-CM | POA: Diagnosis not present

## 2019-10-21 DIAGNOSIS — E041 Nontoxic single thyroid nodule: Secondary | ICD-10-CM | POA: Diagnosis not present

## 2019-10-21 DIAGNOSIS — E049 Nontoxic goiter, unspecified: Secondary | ICD-10-CM | POA: Diagnosis not present

## 2019-10-21 DIAGNOSIS — E559 Vitamin D deficiency, unspecified: Secondary | ICD-10-CM | POA: Diagnosis not present

## 2019-10-21 LAB — TSH: TSH: 1.79 (ref 0.41–5.90)

## 2019-10-25 ENCOUNTER — Telehealth: Payer: Self-pay | Admitting: Internal Medicine

## 2019-10-25 NOTE — Telephone Encounter (Signed)
Left message for pt to call back  °

## 2019-10-26 NOTE — Telephone Encounter (Signed)
Patient returned your call about results, please call patient one more time.

## 2019-10-27 NOTE — Telephone Encounter (Signed)
Pt had UA done with PCP and was concerned because it looked like she was a little dehydrated. Pt is going to have more labs with PCP on Monday and she will wait for those results and let us know if there is a problem.

## 2019-11-02 ENCOUNTER — Other Ambulatory Visit: Payer: Self-pay | Admitting: Internal Medicine

## 2019-11-02 DIAGNOSIS — K259 Gastric ulcer, unspecified as acute or chronic, without hemorrhage or perforation: Secondary | ICD-10-CM

## 2019-11-30 ENCOUNTER — Other Ambulatory Visit: Payer: Medicare Other

## 2019-11-30 ENCOUNTER — Ambulatory Visit: Payer: Medicare Other | Admitting: Hematology

## 2019-12-01 ENCOUNTER — Other Ambulatory Visit: Payer: Self-pay

## 2019-12-01 ENCOUNTER — Inpatient Hospital Stay: Payer: Medicare Other | Attending: Hematology | Admitting: Hematology

## 2019-12-01 ENCOUNTER — Telehealth: Payer: Self-pay | Admitting: Hematology

## 2019-12-01 ENCOUNTER — Inpatient Hospital Stay: Payer: Medicare Other

## 2019-12-01 VITALS — BP 122/63 | HR 75 | Temp 97.9°F | Resp 18 | Ht 64.25 in | Wt 169.8 lb

## 2019-12-01 DIAGNOSIS — K766 Portal hypertension: Secondary | ICD-10-CM | POA: Insufficient documentation

## 2019-12-01 DIAGNOSIS — K7460 Unspecified cirrhosis of liver: Secondary | ICD-10-CM | POA: Diagnosis not present

## 2019-12-01 DIAGNOSIS — D731 Hypersplenism: Secondary | ICD-10-CM | POA: Diagnosis not present

## 2019-12-01 DIAGNOSIS — E039 Hypothyroidism, unspecified: Secondary | ICD-10-CM | POA: Insufficient documentation

## 2019-12-01 DIAGNOSIS — Z79899 Other long term (current) drug therapy: Secondary | ICD-10-CM | POA: Insufficient documentation

## 2019-12-01 DIAGNOSIS — D696 Thrombocytopenia, unspecified: Secondary | ICD-10-CM

## 2019-12-01 DIAGNOSIS — K769 Liver disease, unspecified: Secondary | ICD-10-CM | POA: Diagnosis not present

## 2019-12-01 DIAGNOSIS — K7581 Nonalcoholic steatohepatitis (NASH): Secondary | ICD-10-CM | POA: Diagnosis not present

## 2019-12-01 DIAGNOSIS — Z86010 Personal history of colonic polyps: Secondary | ICD-10-CM | POA: Diagnosis not present

## 2019-12-01 DIAGNOSIS — R162 Hepatomegaly with splenomegaly, not elsewhere classified: Secondary | ICD-10-CM | POA: Diagnosis not present

## 2019-12-01 DIAGNOSIS — Z7984 Long term (current) use of oral hypoglycemic drugs: Secondary | ICD-10-CM | POA: Insufficient documentation

## 2019-12-01 DIAGNOSIS — I10 Essential (primary) hypertension: Secondary | ICD-10-CM | POA: Diagnosis not present

## 2019-12-01 DIAGNOSIS — Z87891 Personal history of nicotine dependence: Secondary | ICD-10-CM | POA: Insufficient documentation

## 2019-12-01 DIAGNOSIS — C220 Liver cell carcinoma: Secondary | ICD-10-CM

## 2019-12-01 DIAGNOSIS — K760 Fatty (change of) liver, not elsewhere classified: Secondary | ICD-10-CM | POA: Diagnosis not present

## 2019-12-01 DIAGNOSIS — M7989 Other specified soft tissue disorders: Secondary | ICD-10-CM | POA: Insufficient documentation

## 2019-12-01 DIAGNOSIS — K5900 Constipation, unspecified: Secondary | ICD-10-CM | POA: Diagnosis not present

## 2019-12-01 DIAGNOSIS — E119 Type 2 diabetes mellitus without complications: Secondary | ICD-10-CM | POA: Insufficient documentation

## 2019-12-01 LAB — CBC WITH DIFFERENTIAL/PLATELET
Abs Immature Granulocytes: 0.02 10*3/uL (ref 0.00–0.07)
Basophils Absolute: 0 10*3/uL (ref 0.0–0.1)
Basophils Relative: 1 %
Eosinophils Absolute: 0.1 10*3/uL (ref 0.0–0.5)
Eosinophils Relative: 2 %
HCT: 41 % (ref 36.0–46.0)
Hemoglobin: 13.8 g/dL (ref 12.0–15.0)
Immature Granulocytes: 1 %
Lymphocytes Relative: 19 %
Lymphs Abs: 0.6 10*3/uL — ABNORMAL LOW (ref 0.7–4.0)
MCH: 31.4 pg (ref 26.0–34.0)
MCHC: 33.7 g/dL (ref 30.0–36.0)
MCV: 93.4 fL (ref 80.0–100.0)
Monocytes Absolute: 0.3 10*3/uL (ref 0.1–1.0)
Monocytes Relative: 9 %
Neutro Abs: 2.3 10*3/uL (ref 1.7–7.7)
Neutrophils Relative %: 68 %
Platelets: 129 10*3/uL — ABNORMAL LOW (ref 150–400)
RBC: 4.39 MIL/uL (ref 3.87–5.11)
RDW: 12.8 % (ref 11.5–15.5)
WBC: 3.3 10*3/uL — ABNORMAL LOW (ref 4.0–10.5)
nRBC: 0 % (ref 0.0–0.2)

## 2019-12-01 LAB — CMP (CANCER CENTER ONLY)
ALT: 21 U/L (ref 0–44)
AST: 26 U/L (ref 15–41)
Albumin: 3.9 g/dL (ref 3.5–5.0)
Alkaline Phosphatase: 89 U/L (ref 38–126)
Anion gap: 11 (ref 5–15)
BUN: 21 mg/dL (ref 8–23)
CO2: 26 mmol/L (ref 22–32)
Calcium: 10.5 mg/dL — ABNORMAL HIGH (ref 8.9–10.3)
Chloride: 102 mmol/L (ref 98–111)
Creatinine: 1.1 mg/dL — ABNORMAL HIGH (ref 0.44–1.00)
GFR, Est AFR Am: 55 mL/min — ABNORMAL LOW (ref >60)
GFR, Estimated: 48 mL/min — ABNORMAL LOW (ref >60)
Glucose, Bld: 84 mg/dL (ref 70–99)
Potassium: 4.4 mmol/L (ref 3.5–5.1)
Sodium: 139 mmol/L (ref 135–145)
Total Bilirubin: 1 mg/dL (ref 0.3–1.2)
Total Protein: 7.4 g/dL (ref 6.5–8.1)

## 2019-12-01 NOTE — Progress Notes (Signed)
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 Laurence Ferrari) s/p Y90 rx with good response  Current Treatment: observation and further evaluation   INTERVAL HISTORY:   Crystal Barnett is here for follow-up regarding her thrombocytopenia and regarding her concerning liver lesion - likely low grade HCC. The patient's last visit with [place] was on 06/02/2019. The pt reports that she is doing well overall.  The pt reports that she is on a restricted water/sodium diet. Dr. Laurence Ferrari plans to repeat abdominal MRI in August. Dr. Henrene Pastor is currently managing her Lasix and her leg swelling has been well-controlled. She feels that her umbilical hernia has grown in size.   Of note since the patient's last visit, pt has had MRI Abd ([id number]) completed on 10/01/2019 with results revealing "1. Unchanged ablation site of the liver dome, hepatic segment VII, without residual contrast enhancement. No evidence of viability or recurrence. LI-RADS 5T. 2. No new suspicious liver lesions. 3. Morphologic stigmata of cirrhosis, ascites, splenomegaly, and small gastroesophageal varices."  Lab results today (12/01/19) of CBC w/diff and CMP is as follows: all values are WNL except for WBC at 3.3K, PLT at 129K, Lymphs Abs at 0.6K, Creatinine at 1.10, Calcium at 10.5, GFR Est Non Af Am at 55. 12/01/2019 AFP tumor marker is WNL @ 4  On review of systems, pt denies unexpected weight loss, abdominal pain and any other symptoms.   REVIEW OF SYSTEMS:   A 10+ POINT REVIEW OF SYSTEMS WAS OBTAINED including neurology, dermatology, psychiatry, cardiac,  respiratory, lymph, extremities, GI, GU, Musculoskeletal, constitutional, breasts, reproductive, HEENT.  All pertinent positives are noted in the HPI.  All others are negative.  . Past Medical History:  Diagnosis Date  . ALLERGIC RHINITIS 01/12/2008  . Allergy   . Cancer [facility])    liver cancer hepatocellular carcinoma per pt   . Cataract    bilateral  . Colon polyps    hyperplastic  . Constipation    ongoing issues per pt  . Diabetes ([place])   . Esophageal varices ([place])   . Fatty liver   . GOITER, MULTINODULAR 01/12/2008  . Hepatic cirrhosis ([place])   . History of radiation therapy 11/20/2018   x 1 radiation for liver cancer  . Hypertension    under control  . HYPOTHYROIDISM 09/07/2008  . 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 Liver cirrhosis secondary to NASH ([place])   . Osteoporosis   . 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 Splenomegaly   . Thrombocytopenia ([place])    Appears chronic from at least 2014  . Vitamin D deficiency     . Past Surgical History:  Procedure Laterality Date  . arthroscopic left knee  2005  . BREAST BIOPSY Left 2017  . BREAST EXCISIONAL BIOPSY Right 2008  . BREAST SURGERY  2008   Breast Biopsy   . CHOLECYSTECTOMY  1985  . COLONOSCOPY    . IR ANGIOGRAM SELECTIVE EACH ADDITIONAL VESSEL  11/03/2018  . IR ANGIOGRAM SELECTIVE EACH ADDITIONAL VESSEL  11/03/2018  . IR ANGIOGRAM SELECTIVE EACH ADDITIONAL VESSEL  11/03/2018  . IR ANGIOGRAM SELECTIVE EACH ADDITIONAL VESSEL  11/03/2018  . IR ANGIOGRAM SELECTIVE EACH ADDITIONAL VESSEL  11/20/2018  . IR ANGIOGRAM SELECTIVE EACH ADDITIONAL VESSEL  11/20/2018  . IR ANGIOGRAM SELECTIVE EACH ADDITIONAL VESSEL  11/20/2018  . IR ANGIOGRAM VISCERAL SELECTIVE  11/03/2018  . IR ANGIOGRAM VISCERAL SELECTIVE  11/03/2018  . IR ANGIOGRAM VISCERAL SELECTIVE  11/03/2018  . IR ANGIOGRAM VISCERAL SELECTIVE  11/20/2018  . IR ANGIOGRAM VISCERAL SELECTIVE  11/20/2018  . IR EMBO TUMOR ORGAN ISCHEMIA  INFARCT INC GUIDE ROADMAPPING  11/20/2018  . IR GENERIC HISTORICAL  04/02/2016   IR RADIOLOGIST EVAL & MGMT 04/02/2016 Jacqulynn Cadet, MD [facility] INTERV RAD  . IR PARACENTESIS  02/02/2018  . IR PARACENTESIS  04/20/2018  . IR PARACENTESIS  12/03/2018  . IR PARACENTESIS  07/15/2019  . IR RADIOLOGIST EVAL & MGMT  10/29/2016  . IR RADIOLOGIST EVAL & MGMT  04/29/2017  . IR RADIOLOGIST EVAL & MGMT  11/04/2017  . IR RADIOLOGIST EVAL & MGMT  04/16/2018  . IR RADIOLOGIST EVAL & MGMT  12/08/2018  . IR RADIOLOGIST EVAL & MGMT  03/16/2019  . IR RADIOLOGIST EVAL & MGMT  07/08/2019  . IR RADIOLOGIST EVAL & MGMT  10/07/2019  . IR US GUIDE VASC ACCESS LEFT  11/20/2018  . IR US GUIDE VASC ACCESS RIGHT  11/03/2018  . POLYPECTOMY    . UPPER GASTROINTESTINAL ENDOSCOPY      Social History   Tobacco Use  . Smoking status: Former Smoker    Quit date: 05/13/1990    Years since quitting: 29.5  . Smokeless tobacco: Never Used  Vaping Use  . Vaping Use: Never used  Substance Use Topics  . Alcohol use: No    Alcohol/week: 0.0 standard drinks  . Drug use: No    ALLERGIES:  has No Known Allergies.   MEDICATIONS:  Current Outpatient Medications  Medication Sig Dispense Refill  . alendronate (FOSAMAX) 70 MG tablet Take 70 mg by mouth once a week. On Saturday    . Blood Glucose Monitoring Suppl (ONETOUCH VERIO FLEX SYSTEM) w/Device KIT OneTouch Verio System  USE TO TEST ONCE D UTD    . Calcium Carbonate-Vit D-Min (CALCIUM 1200 PO) Take 1,200 mg by mouth every other day.    . Cholecalciferol (VITAMIN D) 50 MCG (2000 UT) CAPS Take 2,000 Units by mouth at bedtime.    . fluticasone (FLONASE) 50 MCG/ACT nasal spray Place 1 spray into both nostrils daily.     . furosemide (LASIX) 40 MG tablet Take 1.5 tablets (60 mg total) by mouth daily. 135 tablet 1  . Lactobacillus (PROBIOTIC ACIDOPHILUS) CAPS Take 1 capsule by mouth 2 (two) times a week.     . levothyroxine (SYNTHROID) 50 MCG tablet TAKE 1 TABLET EVERY DAY 90 tablet  3  . metFORMIN (GLUCOPHAGE) 500 MG tablet Take 500 mg by mouth every evening. Take 1 tablet by mouth once daily     . montelukast (SINGULAIR) 10 MG tablet Take 10 mg by mouth at bedtime.     . Multiple Vitamin (MULTIVITAMIN) tablet Take 1 tablet by mouth at bedtime.     . pantoprazole (PROTONIX) 40 MG tablet TAKE 1 TABLET TWICE DAILY 180 tablet 1  . spironolactone (ALDACTONE) 100 MG tablet Take 2 tablets (200 mg total) by mouth daily. 180 tablet 1  . vitamin C (ASCORBIC ACID) 500 MG tablet Take 500 mg by mouth every other day.     No current facility-administered medications for this visit.  PHYSICAL EXAMINATION: ECOG PERFORMANCE STATUS: 1 - Symptomatic but completely ambulatory  Vitals:   12/01/19 1406  BP: 122/63  Pulse: 75  Resp: 18  Temp: 97.9 F (36.6 C)  SpO2: 100%   Filed Weights   12/01/19 1406  Weight: 169 lb 12.8 oz (77 kg)   .Body mass index is 28.92 kg/m.  Exam was given in a chair   GENERAL:alert, in no acute distress and comfortable SKIN: no acute rashes, no significant lesions EYES: conjunctiva are pink and non-injected, sclera anicteric OROPHARYNX: MMM, no exudates, no oropharyngeal erythema or ulceration NECK: supple, no JVD LYMPH:  no palpable lymphadenopathy in the cervical, axillary or inguinal regions LUNGS: clear to auscultation b/l with normal respiratory effort HEART: regular rate & rhythm ABDOMEN:  normoactive bowel sounds , non tender, not distended. No palpable hepatosplenomegaly.  Extremity: 1+ pedal edema PSYCH: alert & oriented x 3 with fluent speech NEURO: no focal motor/sensory deficits  LABORATORY DATA:  I have reviewed the data as listed  . CBC Latest Ref Rng & Units 12/01/2019 09/29/2019 06/02/2019  WBC 4.0 - 10.5 K/uL 3.3(L) 3.7(L) 3.7(L)  Hemoglobin 12.0 - 15.0 g/dL 13.8 14.1 13.6  Hematocrit 36 - 46 % 41.0 41.9 39.8  Platelets 150 - 400 K/uL 129(L) 131(L) 133(L)    CMP Latest Ref Rng & Units 12/01/2019 10/04/2019 09/29/2019    Glucose 70 - 99 mg/dL 84 91 98  BUN 8 - 23 mg/dL 21 28(H) 27(H)  Creatinine 0.44 - 1.00 mg/dL 1.10(H) 1.01 0.98  Sodium 135 - 145 mmol/L 139 135 136  Potassium 3.5 - 5.1 mmol/L 4.4 4.7 4.6  Chloride 98 - 111 mmol/L 102 101 100  CO2 22 - 32 mmol/L 26 27 23   Calcium 8.9 - 10.3 mg/dL 10.5(H) 9.7 9.7  Total Protein 6.5 - 8.1 g/dL 7.4 - 7.4  Total Bilirubin 0.3 - 1.2 mg/dL 1.0 - 1.2  Alkaline Phos 38 - 126 U/L 89 - 78  AST 15 - 41 U/L 26 - 37  ALT 0 - 44 U/L 21 - 28   AFP tumor marker 7/24- 4.8  RADIOLOGY  MRI Abdomen W WO Contrast 10/28/17  IMPRESSION: 1.2 cm hypervascular mass in the dome of the right hepatic lobe shows no significant change, however Imaging findings are diagnostic of hepatocellular carcinoma in the setting of cirrhosis. No evidence of abdominal metastatic disease. Hepatic cirrhosis, and mild splenomegaly consistent with portal venous hypertension. Moderate ascites, increased since prior study.  MRI abd w and wo contrast 04/23/17 stable appearance of segment 7 liver lesion. Highly suspicious for hepatocellular carcinoma. Cirrhosis, mild hepatic steatosis and splenomegaly.   MRI abd w and wo contrast 10/22/2016 CLINICAL DATA:  Followup of liver lesions. Nonalcoholic steatohepatitis induced cirrhosis. Thrombocytopenia. EXAM: MRI ABDOMEN WITHOUT AND WITH CONTRAST TECHNIQUE: Multiplanar multisequence MR imaging of the abdomen was performed both before and after the administration of intravenous contrast. CONTRAST:  74m EOVIST GADOXETATE DISODIUM 0.25 MOL/L IV SOLN COMPARISON:  MRI of 02/27/2016.  Clinic note of 04/02/2016. FINDINGS: Motion degradation involves the arterial phase post-contrast images primarily. Lower chest: Normal heart size without pericardial or pleural effusion. Hepatobiliary: Moderate to marked cirrhosis. Segment 7 liver lesion has undergone mild interval enlargement. For example, on T2 weighted imaging, measures 12 x 10 mm (image  13/series 7) versus 10 x 9 mm on the prior. On post-contrast image 27/ series 502, measures 17 x 13 mm today versus 14 x 10 mm on the prior (when remeasured). Demonstrates arterial and portal venous phase  hyper enhancement with relative isointensity on more delayed post-contrast imaging. Left hepatic lobe tiny lesion is likely a cyst and is similar. Mild hepatic steatosis. Cholecystectomy, without biliary ductal dilatation. Pancreas:  Normal, without mass or ductal dilatation. Spleen:  Mild splenomegaly, 14.3 cm craniocaudal. Adrenals/Urinary Tract: Normal adrenal glands. Normal kidneys, without hydronephrosis. Stomach/Bowel: Normal stomach and abdominal bowel loops. Vascular/Lymphatic: Patent hepatic and portal veins. No specific evidence of portal venous hypertension. Circumaortic left renal vein. No retroperitoneal or retrocrural adenopathy. Other:  Small volume perihepatic ascites is unchanged Musculoskeletal: No acute osseous abnormality. IMPRESSION: 1. Mild interval enlargement of a segment 7 liver lesion, highly suspicious for hepatocellular carcinoma. 2. Cirrhosis, mild hepatic steatosis, and mild splenomegaly. Electronically Signed   By: Abigail Miyamoto M.D.   On: 10/22/2016 10:22    ASSESSMENT & PLAN:   79 y.o. female with  #1 Chronic thrombocytopenia. Platelet counts stable at 107k Likely related to cirrhosis related to NASH with mild splenomegaly.  B12, TSH, LDH within normal limits. MRI abd shows mild splenomegaly at 14.5 cm  #2 liver cirrhosis likely related to NASH hepatitis profile neg, ferritin unrevealing. Recent EGD showed no evidence of varices. Subtle changes of portal hypertensive gastropathy.  #3 Lesion in the RIGHT hepatic lobe has imaging characteristics highly suspicious for hepatic cellular carcinoma including arterial enhancement and a new peripheral rim.   04/23/17 MRI results do not indicate a significant change in the liver nodule   -Rpt MRI  from 10/28/17 shows overall stable liver lesion at 1.2cm, mild splenomegaly, moderate ascites, liver cirrhosis.   04/16/18 MRI Abdomen which revealed There has been mild increase in size of hypervascular liver lesion within posterior dome. Enhancement characteristics are compatible with LR-5 (definitely [place]). No new lesions identified. 2. Morphologic features of liver compatible with cirrhosis with stigmata of portal venous hypertension.  03/11/2019:MRI abd w and w/o contrast  IMPRESSION: 1. Treated segment 7 right liver mass is decreased in size and demonstrates no internal solid enhancement to suggest residual or recurrent viability. Geographic signal intensity changes at the liver dome surrounding the treated lesion, favor post treatment change. No new liver masses. Continued MRI surveillance recommended. 2. Cirrhosis. Small to moderate volume ascites. Mild splenomegaly. Small gastroesophageal and paraumbilical varices.  PLAN: -Discussed pt labwork today, 12/01/19; blood counts are stable, blood chemistries are steady, AFP tumor marker is in progress  -Discussed 10/01/2019 MRI Abd ([id number]) which revealed "1. Unchanged ablation site of the liver dome, hepatic segment VII, without residual contrast enhancement. No evidence of viability or recurrence. LI-RADS 5T. 2. No new suspicious liver lesions. 3. Morphologic stigmata of cirrhosis, ascites, splenomegaly, and small gastroesophageal varices." -s/p Y-90 radioembolization of HCC-- following with IR Dr Laurence Ferrari.  -No lab, clinical, or radiographic evidence of Hepatic Cellular Carcinoma recurrence at this time.  -Advised pt that NASH-related liver cirrhosis caused splenomegaly and increased spleen activity. Some of the hypersplenism may be reversing after her hepatic segmentectomy, which allows more PLT into the bloodstream.  -Recommend pt f/u with Dr. Laurence Ferrari and Dr. Henrene Pastor as scheduled -Continue Lasix as prescribed -Due to stability of  labs will see back in 12 months with labs    FOLLOW UP: RTC with Dr Irene Limbo with labs in 12 months   The total time spent in the appt was 20 minutes and more than 50% was on counseling and direct patient cares.  All of the patient's questions were answered with apparent satisfaction. The patient knows to call the clinic with any problems, questions or concerns.  Sullivan Lone MD [place] AAHIVMS [facility] Hematology/Oncology Physician [facility]  (Office):       [phone] (Work cell):  [phone] (Fax):           [phone]  I, Yevette Edwards, am acting as a scribe for Dr. Sullivan Lone.   .I have reviewed the above documentation for accuracy and completeness, and I agree with the above. Brunetta Genera MD

## 2019-12-01 NOTE — Telephone Encounter (Signed)
Scheduled per 7/21 los. Printed avs and calendar for pt

## 2019-12-02 LAB — AFP TUMOR MARKER: AFP, Serum, Tumor Marker: 4 ng/mL (ref 0.0–8.3)

## 2019-12-08 ENCOUNTER — Other Ambulatory Visit: Payer: Self-pay

## 2019-12-08 ENCOUNTER — Telehealth: Payer: Self-pay | Admitting: *Deleted

## 2019-12-08 ENCOUNTER — Other Ambulatory Visit (INDEPENDENT_AMBULATORY_CARE_PROVIDER_SITE_OTHER): Payer: Medicare Other

## 2019-12-08 ENCOUNTER — Other Ambulatory Visit: Payer: Self-pay | Admitting: Interventional Radiology

## 2019-12-08 DIAGNOSIS — K7460 Unspecified cirrhosis of liver: Secondary | ICD-10-CM

## 2019-12-08 DIAGNOSIS — R188 Other ascites: Secondary | ICD-10-CM

## 2019-12-08 DIAGNOSIS — C220 Liver cell carcinoma: Secondary | ICD-10-CM

## 2019-12-08 LAB — BASIC METABOLIC PANEL
BUN: 21 mg/dL (ref 6–23)
CO2: 24 mEq/L (ref 19–32)
Calcium: 9.6 mg/dL (ref 8.4–10.5)
Chloride: 102 mEq/L (ref 96–112)
Creatinine, Ser: 1.02 mg/dL (ref 0.40–1.20)
GFR: 52.18 mL/min — ABNORMAL LOW (ref >60.00)
Glucose, Bld: 94 mg/dL (ref 70–99)
Potassium: 4.4 mEq/L (ref 3.5–5.1)
Sodium: 133 mEq/L — ABNORMAL LOW (ref 135–145)

## 2019-12-08 NOTE — Telephone Encounter (Addendum)
Patient called office: Has itchy areas on body - like rash, slightly red, but no bumps.  She contacted her Gastroenterologist and they told her was likely related to her liver. She has also developed a raised bump/knot on her back, to left of spine. She states she has a history of lipomas, so she wondered if it it could be that. She requested information on who should evaluate the bump.  Contacted patient and left VM: Per Dr. Irene Limbo, have PCP evaluate, refer to CC if needed. Her [place] was localized, treated and stable - so unlikely to be metastatic disease. Her PLT had improved to >100k-- unlikely to be petechaie or hematoma. Patient verbalized understanding and states she will call PCP.

## 2019-12-10 DIAGNOSIS — R21 Rash and other nonspecific skin eruption: Secondary | ICD-10-CM | POA: Diagnosis not present

## 2019-12-14 ENCOUNTER — Other Ambulatory Visit: Payer: Self-pay | Admitting: Family Medicine

## 2019-12-14 DIAGNOSIS — Z1231 Encounter for screening mammogram for malignant neoplasm of breast: Secondary | ICD-10-CM

## 2019-12-23 DIAGNOSIS — E119 Type 2 diabetes mellitus without complications: Secondary | ICD-10-CM | POA: Diagnosis not present

## 2019-12-23 DIAGNOSIS — Z961 Presence of intraocular lens: Secondary | ICD-10-CM | POA: Diagnosis not present

## 2019-12-29 ENCOUNTER — Other Ambulatory Visit: Payer: Self-pay | Admitting: Internal Medicine

## 2019-12-31 ENCOUNTER — Other Ambulatory Visit: Payer: Self-pay

## 2019-12-31 ENCOUNTER — Ambulatory Visit (HOSPITAL_COMMUNITY)
Admission: RE | Admit: 2019-12-31 | Discharge: 2019-12-31 | Disposition: A | Discharge status: Home / Self Care | Payer: Medicare Other | Source: Ambulatory Visit | Attending: Interventional Radiology | Admitting: Interventional Radiology

## 2019-12-31 DIAGNOSIS — I10 Essential (primary) hypertension: Secondary | ICD-10-CM | POA: Diagnosis not present

## 2019-12-31 DIAGNOSIS — K7689 Other specified diseases of liver: Secondary | ICD-10-CM | POA: Diagnosis not present

## 2019-12-31 DIAGNOSIS — C220 Liver cell carcinoma: Secondary | ICD-10-CM

## 2019-12-31 DIAGNOSIS — I700 Atherosclerosis of aorta: Secondary | ICD-10-CM | POA: Diagnosis not present

## 2019-12-31 DIAGNOSIS — K7460 Unspecified cirrhosis of liver: Secondary | ICD-10-CM | POA: Diagnosis not present

## 2019-12-31 MED ORDER — GADOXETATE DISODIUM 0.25 MMOL/ML IV SOLN
8.0000 mL | Freq: Once | INTRAVENOUS | Status: AC | PRN
  Administered 2019-12-31: 8 mL via INTRAVENOUS

## 2020-01-05 ENCOUNTER — Ambulatory Visit
Admission: RE | Admit: 2020-01-05 | Discharge: 2020-01-05 | Disposition: A | Discharge status: Home / Self Care | Payer: Medicare Other | Source: Ambulatory Visit | Attending: Interventional Radiology | Admitting: Interventional Radiology

## 2020-01-05 ENCOUNTER — Other Ambulatory Visit: Payer: Self-pay

## 2020-01-05 DIAGNOSIS — C220 Liver cell carcinoma: Secondary | ICD-10-CM | POA: Diagnosis not present

## 2020-01-05 HISTORY — PX: IR RADIOLOGIST EVAL & MGMT: IMG5224

## 2020-01-05 NOTE — Progress Notes (Signed)
Chief Complaint: Patient was seen in follow-up remotely today (TeleHealth) for hepatocellular cancer at the request of [place].    Referring Physician(s): Dr. Irene Limbo  History of Present Illness: Crystal Barnett is a 79 y.o. 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 MRI imaging was performed first in December 2016 to evaluate a region of possible concern in the left hepatic lobe. On that study, there was a small 9 mm enhancing focus in hepatic segment 7 in the posterosuperior aspect of the hepatic dome. This subsequently been followed at 6 month intervals with repeat MRI scans of the abdomen.  MRI from 12/5/19demonstrates more definitive enlargement of the lesion now measuring up to 1.9 cm. Additionally, there appears to be an enhancing pseudocapsule. On the present study, the imaging characteristics are consistent with aLi-RADS5 lesion which is diagnostic of hepatocellular carcinoma. The very slow growth rate over the past 3 years suggests a low-grade or indolent malignancy at this time.  Due to the presence of ascites and the location of the lesion high in the right dome just under the lung, we elected to proceed with transarterial radio embolization segmentectomy(8)which was performed on 11/17/2018.  Today, we are having our84-monthpost procedure follow-up consultation via telemedicine. Crystal Barnett doing well. She denies fatigue, abdominal pain, nausea, vomiting or other clinical symptoms.  MRI 03/11/19:Treated segment 7 right liver mass is decreased in size and demonstrates no internal solid enhancement to suggest residual or recurrent viability. Geographic signal intensity changes at the liver dome surrounding the treated lesion, favor post treatment change. No new liver masses. Continued MRI surveillance recommended.  MRI 07/05/19:Stable appearance of treated segment 7 right liver lesion which is decreased in  size without internal enhancement. No new liver Lesions.  MRI 10/01/19: Unchanged ablation site of the liver dome, hepatic segment VII, without residual contrast enhancement. No evidence of viability or recurrence. LI-RADS 5T. No new suspicious liver lesions.  MRI  12/31/19: Similar size of ablation defect within the high right hepatic lobe, without findings of recurrent or metastatic disease.  Cirrhosis and portal venous hypertension, without new hepatocellular carcinoma.  Past Medical History:  Diagnosis Date  . ALLERGIC RHINITIS 01/12/2008  . Allergy   . Cancer ([facility]    liver cancer hepatocellular carcinoma per pt   . Cataract    bilateral  . Colon polyps    hyperplastic  . Constipation    ongoing issues per pt  . Diabetes ([facility]   . Esophageal varices ([facility]   . Fatty liver   . GOITER, MULTINODULAR 01/12/2008  . Hepatic cirrhosis ([facility]   . History of radiation therapy 11/20/2018   x 1 radiation for liver cancer  . Hypertension    under control  . HYPOTHYROIDISM 09/07/2008  . 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 Liver cirrhosis secondary to NASH ([place]   . Osteoporosis   . 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 Splenomegaly   . Thrombocytopenia ([facility]    Appears chronic from at least 2014  . Vitamin D deficiency     Past Surgical History:  Procedure Laterality Date  . arthroscopic left knee  2005  . BREAST BIOPSY Left 2017  . BREAST EXCISIONAL BIOPSY Right 2008  . BREAST SURGERY  2008   Breast Biopsy   . CHOLECYSTECTOMY  1985  . COLONOSCOPY    . IR ANGIOGRAM SELECTIVE EACH ADDITIONAL VESSEL  11/03/2018  . IR ANGIOGRAM  SELECTIVE EACH ADDITIONAL VESSEL  11/03/2018  . IR ANGIOGRAM SELECTIVE EACH ADDITIONAL VESSEL  11/03/2018  . IR ANGIOGRAM SELECTIVE EACH ADDITIONAL VESSEL  11/03/2018  . IR ANGIOGRAM SELECTIVE EACH ADDITIONAL VESSEL  11/20/2018  . IR ANGIOGRAM SELECTIVE EACH ADDITIONAL VESSEL  11/20/2018  . IR ANGIOGRAM SELECTIVE EACH  ADDITIONAL VESSEL  11/20/2018  . IR ANGIOGRAM VISCERAL SELECTIVE  11/03/2018  . IR ANGIOGRAM VISCERAL SELECTIVE  11/03/2018  . IR ANGIOGRAM VISCERAL SELECTIVE  11/03/2018  . IR ANGIOGRAM VISCERAL SELECTIVE  11/20/2018  . IR ANGIOGRAM VISCERAL SELECTIVE  11/20/2018  . IR EMBO TUMOR ORGAN ISCHEMIA INFARCT INC GUIDE ROADMAPPING  11/20/2018  . IR GENERIC HISTORICAL  04/02/2016   IR RADIOLOGIST EVAL & MGMT 04/02/2016 Jacqulynn Cadet, MD [facility] INTERV RAD  . IR PARACENTESIS  02/02/2018  . IR PARACENTESIS  04/20/2018  . IR PARACENTESIS  12/03/2018  . IR PARACENTESIS  07/15/2019  . IR RADIOLOGIST EVAL & MGMT  10/29/2016  . IR RADIOLOGIST EVAL & MGMT  04/29/2017  . IR RADIOLOGIST EVAL & MGMT  11/04/2017  . IR RADIOLOGIST EVAL & MGMT  04/16/2018  . IR RADIOLOGIST EVAL & MGMT  12/08/2018  . IR RADIOLOGIST EVAL & MGMT  03/16/2019  . IR RADIOLOGIST EVAL & MGMT  07/08/2019  . IR RADIOLOGIST EVAL & MGMT  10/07/2019  . IR RADIOLOGIST EVAL & MGMT  01/05/2020  . IR US GUIDE VASC ACCESS LEFT  11/20/2018  . IR US GUIDE VASC ACCESS RIGHT  11/03/2018  . POLYPECTOMY    . UPPER GASTROINTESTINAL ENDOSCOPY      Allergies: Patient has no known allergies.  Medications: Prior to Admission medications   Medication Sig Start Date End Date Taking? Authorizing Provider  alendronate (FOSAMAX) 70 MG tablet Take 70 mg by mouth once a week. On Saturday 03/24/17   [provider]  Blood Glucose Monitoring Suppl ([place]) w/Device KIT OneTouch Verio System  USE TO TEST ONCE D UTD    [provider]  Calcium Carbonate-Vit D-Min (CALCIUM 1200 PO) Take 1,200 mg by mouth every other day.    [provider]  Cholecalciferol (VITAMIN D) 50 MCG (2000 UT) CAPS Take 2,000 Units by mouth at bedtime.    [provider]  fluticasone (FLONASE) 50 MCG/ACT nasal spray Place 1 spray into both nostrils daily.     [provider]  furosemide (LASIX) 40 MG tablet TAKE 1 AND 1/2 TABLETS EVERY  DAY 12/30/19   Irene Shipper, MD  Lactobacillus (PROBIOTIC ACIDOPHILUS) CAPS Take 1 capsule by mouth 2 (two) times a week.     [provider]  levothyroxine (SYNTHROID) 50 MCG tablet TAKE 1 TABLET EVERY DAY 06/08/19   Renato Shin, MD  metFORMIN (GLUCOPHAGE) 500 MG tablet Take 500 mg by mouth every evening. Take 1 tablet by mouth once daily     [provider]  montelukast (SINGULAIR) 10 MG tablet Take 10 mg by mouth at bedtime.  03/25/14   [provider]  Multiple Vitamin (MULTIVITAMIN) tablet Take 1 tablet by mouth at bedtime.     [provider]  pantoprazole (PROTONIX) 40 MG tablet TAKE 1 TABLET TWICE DAILY 11/03/19   Irene Shipper, MD  spironolactone (ALDACTONE) 100 MG tablet TAKE 2 TABLETS EVERY DAY 12/30/19   Irene Shipper, MD  vitamin C (ASCORBIC ACID) 500 MG tablet Take 500 mg by mouth every other day.    [provider]     Family History  Problem Relation  Age of Onset  . Goiter Maternal Grandmother   . Stroke Father 57  . Breast cancer Mother        in 15's  . Colon cancer Neg Hx   . Colon polyps Neg Hx   . Esophageal cancer Neg Hx   . Stomach cancer Neg Hx   . Rectal cancer Neg Hx     Social History   Socioeconomic History  . Marital status: Single    Spouse name: Not on file  . Number of children: 1  . Years of education: Not on file  . Highest education level: Not on file  Occupational History  . Occupation: retired    Comment: works in Chiropodist for [employer]  Tobacco Use  . Smoking status: Former Smoker    Quit date: 05/13/1990    Years since quitting: 29.6  . Smokeless tobacco: Never Used  Vaping Use  . Vaping Use: Never used  Substance and Sexual Activity  . Alcohol use: No    Alcohol/week: 0.0 standard drinks  . Drug use: No  . Sexual activity: Not on file  Other Topics Concern  . Not on file  Social History Narrative  . Not on file   Social Determinants of Health   Financial Resource  Strain:   . Difficulty of Paying Living Expenses: Not on file  Food Insecurity:   . Worried About Charity fundraiser in the Last Year: Not on file  . Ran Out of Food in the Last Year: Not on file  Transportation Needs:   . Lack of Transportation (Medical): Not on file  . Lack of Transportation (Non-Medical): Not on file  Physical Activity:   . Days of Exercise per Week: Not on file  . Minutes of Exercise per Session: Not on file  Stress:   . Feeling of Stress : Not on file  Social Connections:   . Frequency of Communication with Friends and Family: Not on file  . Frequency of Social Gatherings with Friends and Family: Not on file  . Attends Religious Services: Not on file  . Active Member of Clubs or Organizations: Not on file  . Attends Archivist Meetings: Not on file  . Marital Status: Not on file    ECOG Status: 0 - Asymptomatic  Review of Systems  Review of Systems: A 12 point ROS discussed and pertinent positives are indicated in the HPI above.  All other systems are negative.  Physical Exam No direct physical exam was performed (except for noted visual exam findings with Video Visits).   Vital Signs: There were no vitals taken for this visit.  Imaging: MR ABDOMEN WWO CONTRAST  Result Date: 12/31/2019 CLINICAL DATA:  Hepatocellular carcinoma. Cirrhosis. Right liver dome lesion, status post Y 90 radioembolization. EXAM: MRI ABDOMEN WITHOUT AND WITH CONTRAST TECHNIQUE: Multiplanar multisequence MR imaging of the abdomen was performed both before and after the administration of intravenous contrast. CONTRAST:  47m EOVIST GADOXETATE DISODIUM 0.25 MOL/L IV SOLN COMPARISON:  06/02/2019 FINDINGS: Lower chest: Normal heart size without pericardial or pleural effusion. Prominent cardiophrenic angle nodes are likely reactive. A 3 mm left lower lobe pulmonary nodule on 06/13 is similar to on the prior MRI. Hepatobiliary: Advanced cirrhosis. High right hepatic lobe (segment  7) ablation defect measures 1.0 cm on 20/13 and is similar in size to on the prior exam (when remeasured). No post-contrast enhancement. No new liver lesion. Cholecystectomy, without biliary ductal dilatation. Pancreas:  Normal, without mass or  ductal dilatation. Spleen:  Splenomegaly, 13.3 cm craniocaudal. Adrenals/Urinary Tract: Normal adrenal glands. Normal kidneys, without hydronephrosis. Stomach/Bowel: Proximal gastric underdistention. Normal abdominal bowel loops. Vascular/Lymphatic: Aortic atherosclerosis. Circumaortic left renal vein. Portal venous hypertension, with periesophageal and perigastric varices including on 28/13. No abdominal adenopathy. Other:  Moderate volume ascites, similar. Musculoskeletal: Right hepatic lobe T2 hyperintense lesion at T12 is likely a hemangioma on 15/20, similar at 8 mm. IMPRESSION: 1. Similar size of ablation defect within the high right hepatic lobe, without findings of recurrent or metastatic disease. 2. Cirrhosis and portal venous hypertension, without new hepatocellular carcinoma. 3. Similar moderate volume ascites. 4.  Aortic Atherosclerosis (ICD10-I70.0). Electronically Signed   By: Abigail Miyamoto M.D.   On: 12/31/2019 14:58   IR Radiologist Eval & Mgmt  Result Date: 01/05/2020 Please refer to notes tab for details about interventional procedure. (Op Note)   Labs:  CBC: Recent Labs    03/10/19 0939 06/02/19 1355 09/29/19 1050 12/01/19 1319  WBC 3.4* 3.7* 3.7* 3.3*  HGB 13.9 13.6 14.1 13.8  HCT 41.7 39.8 41.9 41.0  PLT 110* 133* 131* 129*    COAGS: Recent Labs    03/10/19 0939 07/02/19 1345 09/29/19 1050  INR 1.1 1.1 1.1    BMP: Recent Labs    03/10/19 0939 04/06/19 0916 06/02/19 1355 06/08/19 0939 09/29/19 1050 10/04/19 0920 12/01/19 1319 12/08/19 0849  NA 135   < > 138   < > 136 135 139 133*  K 4.4   < > 4.5   < > 4.6 4.7 4.4 4.4  CL 101   < > 103   < > 100 101 102 102  CO2 24   < > 24   < > 23 27 26 24   GLUCOSE 94   < > 84    < > 98 91 84 94  BUN 21   < > 14   < > 27* 28* 21 21  CALCIUM 9.0   < > 9.0   < > 9.7 9.7 10.5* 9.6  CREATININE 0.84   < > 0.82   < > 0.98 1.01 1.10* 1.02  GFRNONAA >60  --  >60  --  55*  --  48*  --   GFRAA >60  --  >60  --  >60  --  55*  --    < > = values in this interval not displayed.    LIVER FUNCTION TESTS: Recent Labs    03/10/19 0939 06/02/19 1355 09/29/19 1050 12/01/19 1319  BILITOT 1.0 0.7 1.2 1.0  AST 30 26 37 26  ALT 24 19 28 21   ALKPHOS 63 101 78 89  PROT 7.0 7.4 7.4 7.4  ALBUMIN 4.1 3.9 4.3 3.9    TUMOR MARKERS: No results for input(s): AFPTM, CEA, CA199, CHROMGRNA in the last 8760 hours.  Assessment and Plan:  Mrs. Kozma continues to do very well 1 year status post radiation segmentectomy.  Her most recent MRI imaging demonstrates no evidence of residual or recurrent disease.  No new lesions are identified.  She remains in disease remission.  We will continue active surveillance.  1.)  Repeat MRI of the abdomen with gadolinium contrast, liver labs and clinic visit in 3 months.    Electronically Signed: Jacqulynn Cadet 01/05/2020, 3:22 PM   I spent a total of 10 Minutes in remote  clinical consultation, greater than 50% of which was counseling/coordinating care for hepatocellular cancer.    Visit type: Audio only (telephone). Audio (no video) only  due to patient preference. Alternative for in-person consultation at [facility], [address], [place], [place]. This visit type was conducted due to national recommendations for restrictions regarding the COVID-19 Pandemic (e.g. social distancing).  This format is felt to be most appropriate for this patient at this time.  All issues noted in this document were discussed and addressed.

## 2020-01-11 ENCOUNTER — Telehealth: Payer: Self-pay | Admitting: Internal Medicine

## 2020-01-11 NOTE — Telephone Encounter (Signed)
Spoke with patient, pt states that she did not have the MRI report at the time that she spoke with the radiologist. She states that they mainly discussed the hepatocellular carcinoma. Advised that she should reach out to their office with her questions, pt states that she will give them a call.

## 2020-01-14 ENCOUNTER — Other Ambulatory Visit: Payer: Self-pay

## 2020-01-14 ENCOUNTER — Ambulatory Visit
Admission: RE | Admit: 2020-01-14 | Discharge: 2020-01-14 | Disposition: A | Discharge status: Home / Self Care | Payer: Medicare Other | Source: Ambulatory Visit | Attending: Family Medicine | Admitting: Family Medicine

## 2020-01-14 DIAGNOSIS — Z1231 Encounter for screening mammogram for malignant neoplasm of breast: Secondary | ICD-10-CM | POA: Diagnosis not present

## 2020-02-02 ENCOUNTER — Other Ambulatory Visit (INDEPENDENT_AMBULATORY_CARE_PROVIDER_SITE_OTHER): Payer: Medicare Other

## 2020-02-02 DIAGNOSIS — K7460 Unspecified cirrhosis of liver: Secondary | ICD-10-CM | POA: Diagnosis not present

## 2020-02-02 DIAGNOSIS — R188 Other ascites: Secondary | ICD-10-CM

## 2020-02-02 LAB — BASIC METABOLIC PANEL
BUN: 22 mg/dL (ref 6–23)
CO2: 27 mEq/L (ref 19–32)
Calcium: 9.3 mg/dL (ref 8.4–10.5)
Chloride: 100 mEq/L (ref 96–112)
Creatinine, Ser: 0.92 mg/dL (ref 0.40–1.20)
GFR: 58.75 mL/min — ABNORMAL LOW (ref >60.00)
Glucose, Bld: 104 mg/dL — ABNORMAL HIGH (ref 70–99)
Potassium: 4.1 mEq/L (ref 3.5–5.1)
Sodium: 135 mEq/L (ref 135–145)

## 2020-02-03 ENCOUNTER — Other Ambulatory Visit: Payer: Self-pay

## 2020-02-03 DIAGNOSIS — K7460 Unspecified cirrhosis of liver: Secondary | ICD-10-CM

## 2020-02-03 DIAGNOSIS — R188 Other ascites: Secondary | ICD-10-CM

## 2020-02-15 ENCOUNTER — Ambulatory Visit: Payer: Medicare Other | Attending: Internal Medicine

## 2020-02-15 DIAGNOSIS — Z23 Encounter for immunization: Secondary | ICD-10-CM

## 2020-02-15 NOTE — Progress Notes (Signed)
   Covid-19 Vaccination Clinic  Name:  Crystal Barnett    MRN: [id number] DOB: 22-Apr-1941  02/15/2020  [name] was observed post Covid-19 immunization for 15 minutes without incident. She was provided with Vaccine Information Sheet and instruction to access the V-Safe system.   [name] was instructed to call 911 with any severe reactions post vaccine: Marland Kitchen Difficulty breathing  . Swelling of face and throat  . A fast heartbeat  . A bad rash all over body  . Dizziness and weakness

## 2020-02-22 ENCOUNTER — Telehealth: Payer: Self-pay | Admitting: Internal Medicine

## 2020-02-22 NOTE — Telephone Encounter (Signed)
Pt calling stating her umbilical hernia is bothering her more. She continues to wear the abd binder but reports she is afraid the area is going to rupture/burst. Pt wants to know if Dr. Henrene Pastor will refer her to a surgeon to have this area looked at, please advise.

## 2020-02-22 NOTE — Telephone Encounter (Signed)
1.  Arrange urgent general surgical consultation for symptomatic umbilical hernia 2.  Schedule ultrasound-guided paracentesis (I am not sure if she has fluid, but if she does this will help).  Up to 5 L.  Albumin replacement should be provided. Thanks

## 2020-02-23 ENCOUNTER — Other Ambulatory Visit: Payer: Self-pay

## 2020-02-23 DIAGNOSIS — K7460 Unspecified cirrhosis of liver: Secondary | ICD-10-CM

## 2020-02-23 NOTE — Telephone Encounter (Signed)
Spoke with pt and she is aware. Pt scheduled for IR para at [facility] 02/25/20@2pm , pt to arrive there at 1:45pm. Referral faxed to [facility] for an appt with Dr. Barry Dienes, pt has seen her in the past and would like to see her again. Pt knows she should get a phone call regarding the appt with [facility].

## 2020-02-25 ENCOUNTER — Ambulatory Visit (HOSPITAL_COMMUNITY)
Admission: RE | Admit: 2020-02-25 | Discharge: 2020-02-25 | Disposition: A | Discharge status: Home / Self Care | Payer: Medicare Other | Source: Ambulatory Visit | Attending: Internal Medicine | Admitting: Internal Medicine

## 2020-02-25 DIAGNOSIS — K7460 Unspecified cirrhosis of liver: Secondary | ICD-10-CM | POA: Insufficient documentation

## 2020-02-25 DIAGNOSIS — R188 Other ascites: Secondary | ICD-10-CM | POA: Insufficient documentation

## 2020-02-25 HISTORY — PX: IR PARACENTESIS: IMG2679

## 2020-02-25 LAB — BODY FLUID CELL COUNT WITH DIFFERENTIAL
Eos, Fluid: 0 %
Lymphs, Fluid: 68 %
Monocyte-Macrophage-Serous Fluid: 28 % — ABNORMAL LOW (ref 50–90)
Neutrophil Count, Fluid: 4 % (ref 0–25)
Total Nucleated Cell Count, Fluid: 578 cu mm (ref 0–1000)

## 2020-02-25 MED ORDER — LIDOCAINE HCL 1 % IJ SOLN
INTRAMUSCULAR | Status: AC
  Filled 2020-02-25: qty 20

## 2020-02-25 MED ORDER — LIDOCAINE HCL (PF) 1 % IJ SOLN
INTRAMUSCULAR | Status: DC | PRN
  Administered 2020-02-25: 15 mL

## 2020-02-25 NOTE — Procedures (Signed)
PROCEDURE SUMMARY:  Successful image-guided paracentesis from the left lateral abdomen.  Yielded 2.5 liters of hazy amber fluid.  No immediate complications.  EBL = 0 mL. Patient tolerated well.   Specimen was sent for labs.  Please see imaging section of Epic for full dictation.   Claris Pong Gianlucas Evenson PA-C 02/25/2020 2:42 PM

## 2020-02-29 LAB — PATHOLOGIST SMEAR REVIEW

## 2020-03-08 ENCOUNTER — Encounter: Payer: Self-pay | Admitting: Endocrinology

## 2020-03-08 ENCOUNTER — Other Ambulatory Visit: Payer: Self-pay

## 2020-03-08 ENCOUNTER — Ambulatory Visit (INDEPENDENT_AMBULATORY_CARE_PROVIDER_SITE_OTHER): Payer: Medicare Other | Admitting: Endocrinology

## 2020-03-08 VITALS — BP 118/70 | HR 79 | Ht 64.25 in | Wt 171.0 lb

## 2020-03-08 DIAGNOSIS — E042 Nontoxic multinodular goiter: Secondary | ICD-10-CM

## 2020-03-08 NOTE — Patient Instructions (Addendum)
Please continue the same levothyroxine. Let's recheck the ultrasound.  you will receive a phone call, about a day and time for an appointment. Please come back for a follow-up appointment in 1 year.

## 2020-03-08 NOTE — Progress Notes (Signed)
Subjective:    Patient ID: Crystal Barnett, female    DOB: 11-10-1940, 79 y.o.   MRN: [id number]  HPI pt returns for f/u of multinodular goiter (dx'ed 2009, when bilat bxs were benign in 2009; the cytology did not show chronic thyroiditis, but she has chronic hypothyroidism; she has been on synthroid since 2009; f/u US in 2016 was unchanged; [place] in 2019 showed interval enlargement of the isthmus nodule; bx in 2019 showed BENIGN FOLLICULAR NODULE ([place] CATEGORY II)).  She notices the goiter, but says there has been no change.   Past Medical History:  Diagnosis Date  . ALLERGIC RHINITIS 01/12/2008  . Allergy   . Cancer [facility])    liver cancer hepatocellular carcinoma per pt   . Cataract    bilateral  . Colon polyps    hyperplastic  . Constipation    ongoing issues per pt  . Diabetes ([place])   . Esophageal varices ([place])   . Fatty liver   . GOITER, MULTINODULAR 01/12/2008  . Hepatic cirrhosis ([place])   . History of radiation therapy 11/20/2018   x 1 radiation for liver cancer  . Hypertension    under control  . HYPOTHYROIDISM 09/07/2008  . 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 Liver cirrhosis secondary to NASH ([place])   . Osteoporosis   . 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 Splenomegaly   . Thrombocytopenia ([place])    Appears chronic from at least 2014  . Vitamin D deficiency     Past Surgical History:  Procedure Laterality Date  . arthroscopic left knee  2005  . BREAST BIOPSY Left 2017  . BREAST EXCISIONAL BIOPSY Right 2008  . BREAST SURGERY  2008   Breast Biopsy   . CHOLECYSTECTOMY  1985  . COLONOSCOPY    . IR ANGIOGRAM SELECTIVE EACH ADDITIONAL VESSEL  11/03/2018  . IR ANGIOGRAM SELECTIVE EACH ADDITIONAL VESSEL  11/03/2018  . IR ANGIOGRAM SELECTIVE EACH ADDITIONAL VESSEL  11/03/2018  . IR ANGIOGRAM SELECTIVE EACH ADDITIONAL VESSEL  11/03/2018  . IR ANGIOGRAM SELECTIVE EACH ADDITIONAL VESSEL  11/20/2018  . IR ANGIOGRAM SELECTIVE EACH  ADDITIONAL VESSEL  11/20/2018  . IR ANGIOGRAM SELECTIVE EACH ADDITIONAL VESSEL  11/20/2018  . IR ANGIOGRAM VISCERAL SELECTIVE  11/03/2018  . IR ANGIOGRAM VISCERAL SELECTIVE  11/03/2018  . IR ANGIOGRAM VISCERAL SELECTIVE  11/03/2018  . IR ANGIOGRAM VISCERAL SELECTIVE  11/20/2018  . IR ANGIOGRAM VISCERAL SELECTIVE  11/20/2018  . IR EMBO TUMOR ORGAN ISCHEMIA INFARCT INC GUIDE ROADMAPPING  11/20/2018  . IR GENERIC HISTORICAL  04/02/2016   IR RADIOLOGIST EVAL & MGMT 04/02/2016 Jacqulynn Cadet, MD [facility] INTERV RAD  . IR PARACENTESIS  02/02/2018  . IR PARACENTESIS  04/20/2018  . IR PARACENTESIS  12/03/2018  . IR PARACENTESIS  07/15/2019  . IR PARACENTESIS  02/25/2020  . IR RADIOLOGIST EVAL & MGMT  10/29/2016  . IR RADIOLOGIST EVAL & MGMT  04/29/2017  . IR RADIOLOGIST EVAL & MGMT  11/04/2017  . IR RADIOLOGIST EVAL & MGMT  04/16/2018  . IR RADIOLOGIST EVAL & MGMT  12/08/2018  . IR RADIOLOGIST EVAL & MGMT  03/16/2019  . IR RADIOLOGIST EVAL & MGMT  07/08/2019  . IR RADIOLOGIST EVAL & MGMT  10/07/2019  . IR RADIOLOGIST EVAL & MGMT  01/05/2020  . IR US GUIDE VASC ACCESS LEFT  11/20/2018  . IR US GUIDE VASC ACCESS RIGHT  11/03/2018  . POLYPECTOMY    .  UPPER GASTROINTESTINAL ENDOSCOPY      Social History   Socioeconomic History  . Marital status: Single    Spouse name: Not on file  . Number of children: 1  . Years of education: Not on file  . Highest education level: Not on file  Occupational History  . 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 Smoking status: Former Smoker    Quit date: 05/13/1990    Years since quitting: 29.8  . Smokeless tobacco: Never Used  Vaping Use  . Vaping Use: Never used  Substance and Sexual Activity  . Alcohol use: No    Alcohol/week: 0.0 standard drinks  . Drug use: No  . Sexual activity: Not on file  Other Topics Concern  . Not on file  Social History Narrative  . Not on file   Social Determinants of Health    Financial Resource Strain:   . Difficulty of Paying Living Expenses: Not on file  Food Insecurity:   . Worried About Running Out of Food in the Last Year: Not on file  . Ran Out of Food in the Last Year: Not on file  Transportation Needs:   . Lack of Transportation (Medical): Not on file  . Lack of Transportation (Non-Medical): Not on file  Physical Activity:   . Days of Exercise per Week: Not on file  . Minutes of Exercise per Session: Not on file  Stress:   . Feeling of Stress : Not on file  Social Connections:   . Frequency of Communication with Friends and Family: Not on file  . Frequency of Social Gatherings with Friends and Family: Not on file  . Attends Religious Services: Not on file  . Active Member of Clubs or Organizations: Not on file  . Attends Club or Organization Meetings: Not on file  . Marital Status: Not on file  Intimate Partner Violence:   . Fear of Current or Ex-Partner: Not on file  . Emotionally Abused: Not on file  . Physically Abused: Not on file  . Sexually Abused: Not on file    Current Outpatient Medications on File Prior to Visit  Medication Sig Dispense Refill  . alendronate (FOSAMAX) 70 MG tablet Take 70 mg by mouth once a week. On Saturday    . Blood Glucose Monitoring Suppl (ONETOUCH VERIO FLEX SYSTEM) w/Device KIT OneTouch Verio System  USE TO TEST ONCE D UTD    . Calcium Carbonate-Vit D-Min (CALCIUM 1200 PO) Take 1,200 mg by mouth every other day.    . Cholecalciferol (VITAMIN D) 50 MCG (2000 UT) CAPS Take 2,000 Units by mouth at bedtime.    . fluticasone (FLONASE) 50 MCG/ACT nasal spray Place 1 spray into both nostrils daily.     . furosemide (LASIX) 40 MG tablet TAKE 1 AND 1/2 TABLETS EVERY DAY 135 tablet 1  . Lactobacillus (PROBIOTIC ACIDOPHILUS) CAPS Take 1 capsule by mouth 2 (two) times a week.     . levothyroxine (SYNTHROID) 50 MCG tablet TAKE 1 TABLET EVERY DAY 90 tablet 3  . metFORMIN (GLUCOPHAGE) 500 MG tablet Take 500 mg by mouth  every evening. Take 1 tablet by mouth once daily     . montelukast (SINGULAIR) 10 MG tablet Take 10 mg by mouth at bedtime.     . Multiple Vitamin (MULTIVITAMIN) tablet Take 1 tablet by mouth at bedtime.     . pantoprazole (PROTONIX) 40 MG tablet TAKE 1 TABLET TWICE   DAILY 180 tablet 1  . spironolactone (ALDACTONE) 100 MG tablet TAKE 2 TABLETS EVERY DAY 180 tablet 1  . vitamin C (ASCORBIC ACID) 500 MG tablet Take 500 mg by mouth every other day.     No current facility-administered medications on file prior to visit.    No Known Allergies  Family History  Problem Relation Age of Onset  . Goiter Maternal Grandmother   . Stroke Father 69  . Breast cancer Mother        in 64's  . Colon cancer Neg Hx   . Colon polyps Neg Hx   . Esophageal cancer Neg Hx   . Stomach cancer Neg Hx   . Rectal cancer Neg Hx     BP 118/70   Pulse 79   Ht 5' 4.25" (1.632 m)   Wt 171 lb (77.6 kg)   SpO2 97%   BMI 29.12 kg/m    Review of Systems Denies sob    Objective:   Physical Exam VITAL SIGNS:  See vs page GENERAL: no distress Neck: thyroid is 5-10 times normal size (R>L).  bilat nodules are again palpable.     Lab Results  Component Value Date   TSH 1.79 10/21/2019      Assessment & Plan:  Hypothyroidism: well-replaced MNG, due for recheck  Patient Instructions  Please continue the same levothyroxine. Let's recheck the ultrasound.  you will receive a phone call, about a day and time for an appointment. Please come back for a follow-up appointment in 1 year.

## 2020-03-09 ENCOUNTER — Other Ambulatory Visit: Payer: Self-pay

## 2020-03-09 ENCOUNTER — Other Ambulatory Visit: Payer: Self-pay | Admitting: Interventional Radiology

## 2020-03-09 DIAGNOSIS — C220 Liver cell carcinoma: Secondary | ICD-10-CM

## 2020-03-15 ENCOUNTER — Ambulatory Visit
Admission: RE | Admit: 2020-03-15 | Discharge: 2020-03-15 | Disposition: A | Discharge status: Home / Self Care | Payer: Medicare Other | Source: Ambulatory Visit | Attending: Endocrinology | Admitting: Endocrinology

## 2020-03-15 DIAGNOSIS — E042 Nontoxic multinodular goiter: Secondary | ICD-10-CM

## 2020-03-17 ENCOUNTER — Other Ambulatory Visit (HOSPITAL_COMMUNITY)
Admission: RE | Admit: 2020-03-17 | Discharge: 2020-03-17 | Disposition: A | Discharge status: Home / Self Care | Payer: Medicare Other | Source: Ambulatory Visit | Attending: Interventional Radiology | Admitting: Interventional Radiology

## 2020-03-17 ENCOUNTER — Telehealth: Payer: Self-pay

## 2020-03-17 DIAGNOSIS — C220 Liver cell carcinoma: Secondary | ICD-10-CM | POA: Insufficient documentation

## 2020-03-17 LAB — CBC
HCT: 42.1 % (ref 36.0–46.0)
Hemoglobin: 14.2 g/dL (ref 12.0–15.0)
MCH: 31.2 pg (ref 26.0–34.0)
MCHC: 33.7 g/dL (ref 30.0–36.0)
MCV: 92.5 fL (ref 80.0–100.0)
Platelets: 145 10*3/uL — ABNORMAL LOW (ref 150–400)
RBC: 4.55 MIL/uL (ref 3.87–5.11)
RDW: 12.6 % (ref 11.5–15.5)
WBC: 4 10*3/uL (ref 4.0–10.5)
nRBC: 0 % (ref 0.0–0.2)

## 2020-03-17 LAB — COMPREHENSIVE METABOLIC PANEL
ALT: 22 U/L (ref 0–44)
AST: 33 U/L (ref 15–41)
Albumin: 4.2 g/dL (ref 3.5–5.0)
Alkaline Phosphatase: 82 U/L (ref 38–126)
Anion gap: 13 (ref 5–15)
BUN: 24 mg/dL — ABNORMAL HIGH (ref 8–23)
CO2: 24 mmol/L (ref 22–32)
Calcium: 9.4 mg/dL (ref 8.9–10.3)
Chloride: 95 mmol/L — ABNORMAL LOW (ref 98–111)
Creatinine, Ser: 0.93 mg/dL (ref 0.44–1.00)
GFR, Estimated: >60 mL/min (ref >60)
Glucose, Bld: 91 mg/dL (ref 70–99)
Potassium: 4.6 mmol/L (ref 3.5–5.1)
Sodium: 132 mmol/L — ABNORMAL LOW (ref 135–145)
Total Bilirubin: 1.4 mg/dL — ABNORMAL HIGH (ref 0.3–1.2)
Total Protein: 7.9 g/dL (ref 6.5–8.1)

## 2020-03-17 LAB — PROTIME-INR
INR: 1.1 (ref 0.8–1.2)
Prothrombin Time: 13.3 seconds (ref 11.4–15.2)

## 2020-03-17 NOTE — Telephone Encounter (Signed)
Gave patient MD advice from her thyroid US she had on 03/15/20-patient verbalized an understanding-FYI

## 2020-03-18 LAB — AFP TUMOR MARKER: AFP, Serum, Tumor Marker: 4.2 ng/mL (ref 0.0–8.3)

## 2020-03-20 ENCOUNTER — Ambulatory Visit (HOSPITAL_COMMUNITY)
Admission: RE | Admit: 2020-03-20 | Discharge: 2020-03-20 | Disposition: A | Discharge status: Home / Self Care | Payer: Medicare Other | Source: Ambulatory Visit | Attending: Interventional Radiology | Admitting: Interventional Radiology

## 2020-03-20 ENCOUNTER — Other Ambulatory Visit: Payer: Self-pay

## 2020-03-20 DIAGNOSIS — C220 Liver cell carcinoma: Secondary | ICD-10-CM

## 2020-03-20 DIAGNOSIS — I8510 Secondary esophageal varices without bleeding: Secondary | ICD-10-CM | POA: Diagnosis not present

## 2020-03-20 DIAGNOSIS — K7469 Other cirrhosis of liver: Secondary | ICD-10-CM | POA: Diagnosis not present

## 2020-03-20 DIAGNOSIS — K766 Portal hypertension: Secondary | ICD-10-CM | POA: Diagnosis not present

## 2020-03-20 MED ORDER — GADOXETATE DISODIUM 0.25 MMOL/ML IV SOLN
8.0000 mL | Freq: Once | INTRAVENOUS | Status: AC | PRN
  Administered 2020-03-20: 8 mL via INTRAVENOUS

## 2020-03-20 NOTE — Telephone Encounter (Signed)
Called pt back regarding lab results.

## 2020-03-20 NOTE — Telephone Encounter (Signed)
Patient returning a call from Vicente Males - please call her back a [phone]

## 2020-03-22 ENCOUNTER — Other Ambulatory Visit: Payer: Self-pay | Admitting: *Deleted

## 2020-03-22 DIAGNOSIS — R160 Hepatomegaly, not elsewhere classified: Secondary | ICD-10-CM | POA: Diagnosis not present

## 2020-03-22 DIAGNOSIS — I8500 Esophageal varices without bleeding: Secondary | ICD-10-CM | POA: Diagnosis not present

## 2020-03-22 DIAGNOSIS — K7460 Unspecified cirrhosis of liver: Secondary | ICD-10-CM | POA: Diagnosis not present

## 2020-03-22 DIAGNOSIS — K429 Umbilical hernia without obstruction or gangrene: Secondary | ICD-10-CM | POA: Diagnosis not present

## 2020-03-22 DIAGNOSIS — K7581 Nonalcoholic steatohepatitis (NASH): Secondary | ICD-10-CM | POA: Diagnosis not present

## 2020-03-22 DIAGNOSIS — R188 Other ascites: Secondary | ICD-10-CM | POA: Diagnosis not present

## 2020-03-22 MED ORDER — LEVOTHYROXINE SODIUM 50 MCG PO TABS
50.0000 ug | ORAL_TABLET | Freq: Every day | ORAL | 3 refills | Status: DC
Start: 2020-03-22 — End: 2021-01-24

## 2020-03-23 ENCOUNTER — Ambulatory Visit: Payer: Medicare Other | Admitting: Internal Medicine

## 2020-03-23 ENCOUNTER — Encounter: Payer: Self-pay | Admitting: *Deleted

## 2020-03-23 ENCOUNTER — Ambulatory Visit
Admission: RE | Admit: 2020-03-23 | Discharge: 2020-03-23 | Disposition: A | Discharge status: Home / Self Care | Payer: Medicare Other | Source: Ambulatory Visit | Attending: Interventional Radiology | Admitting: Interventional Radiology

## 2020-03-23 ENCOUNTER — Other Ambulatory Visit: Payer: Self-pay

## 2020-03-23 ENCOUNTER — Telehealth: Payer: Self-pay | Admitting: Internal Medicine

## 2020-03-23 DIAGNOSIS — C220 Liver cell carcinoma: Secondary | ICD-10-CM

## 2020-03-23 DIAGNOSIS — K259 Gastric ulcer, unspecified as acute or chronic, without hemorrhage or perforation: Secondary | ICD-10-CM

## 2020-03-23 HISTORY — PX: IR RADIOLOGIST EVAL & MGMT: IMG5224

## 2020-03-23 NOTE — Progress Notes (Signed)
Chief Complaint: Patient was seen in follow-up remotely today (TeleHealth) for hepatocellular cancer follow-up at the request of Crystal Barnett.    Referring Physician(s): Dr. Irene Limbo  History of Present Illness: Crystal Barnett is a 79 y.o. 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 MRI imaging was performed first in December 2016 to evaluate a region of possible concern in the left hepatic lobe. On that study, there was a small 9 mm enhancing focus in hepatic segment 7 in the posterosuperior aspect of the hepatic dome. This subsequently been followed at 6 month intervals with repeat MRI scans of the abdomen.  MRI from 12/5/19demonstrates more definitive enlargement of the lesion now measuring up to 1.9 cm. Additionally, there appears to be an enhancing pseudocapsule. On the present study, the imaging characteristics are consistent with aLi-RADS5 lesion which is diagnostic of hepatocellular carcinoma. The very slow growth rate over the past 3 years suggests a low-grade or indolent malignancy at this time.  Due to the presence of ascites and the location of the lesion high in the right dome just under the lung, we elected to proceed with transarterial radio embolization segmentectomy(8)which was performed on 11/17/2018.  Today, we are having our32-monthpost procedure follow-up consultation via telemedicine. Crystal Barnett doing well. She denies fatigue, abdominal pain, nausea, vomiting or other clinical symptoms.  MRI 03/11/19:Treated segment 7 right liver mass is decreased in size and demonstrates no internal solid enhancement to suggest residual or recurrent viability. Geographic signal intensity changes at the liver dome surrounding the treated lesion, favor post treatment change. No new liver masses. Continued MRI surveillance recommended.  MRI 07/05/19:Stable appearance of treated segment 7 right liver lesion which is  decreased in size without internal enhancement. No new liver Lesions.  MRI 10/01/19:Unchanged ablation site of the liver dome, hepatic segment VII, without residual contrast enhancement. No evidence of viability or recurrence. LI-RADS 5T. No new suspicious liver lesions.  MRI  12/31/19: Similar size of ablation defect within the high right hepatic lobe, without findings of recurrent or metastatic disease.  Cirrhosis and portal venous hypertension, without new hepatocellular carcinoma.  MRI 03/20/20: At the site of the prior segment 7 hepatocellular carcinoma that was shown on 04/16/2018, there is a small nonenhancing 1.0 by 0.6 cm ablation site with no significant recurrent arterial phase enhancement to suggest active malignancy.  Crystal Barnett in her usual state of health today.  She is doing quite well and has no complaints or active issues.  Past Medical History:  Diagnosis Date  . ALLERGIC RHINITIS 01/12/2008  . Allergy   . Cancer ([facility]    liver cancer hepatocellular carcinoma per pt   . Cataract    bilateral  . Colon polyps    hyperplastic  . Constipation    ongoing issues per pt  . Diabetes ([place]   . Esophageal varices ([place]   . Fatty liver   . GOITER, MULTINODULAR 01/12/2008  . Hepatic [place] ([place]   . History of radiation therapy 11/20/2018   x 1 radiation for liver cancer  . Hypertension    under control  . HYPOTHYROIDISM 09/07/2008  . 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 Liver cirrhosis secondary to NASH ([facility]   . Osteoporosis   . 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 Splenomegaly   . [place]rombocytopenia ([facility]    Appears chronic from at least 2014  . Vitamin D deficiency  Past Surgical History:  Procedure Laterality Date  . arthroscopic left knee  2005  . BREAST BIOPSY Left 2017  . BREAST EXCISIONAL BIOPSY Right 2008  . BREAST SURGERY  2008   Breast Biopsy   . CHOLECYSTECTOMY  1985  . COLONOSCOPY    . IR  ANGIOGRAM SELECTIVE EACH ADDITIONAL VESSEL  11/03/2018  . IR ANGIOGRAM SELECTIVE EACH ADDITIONAL VESSEL  11/03/2018  . IR ANGIOGRAM SELECTIVE EACH ADDITIONAL VESSEL  11/03/2018  . IR ANGIOGRAM SELECTIVE EACH ADDITIONAL VESSEL  11/03/2018  . IR ANGIOGRAM SELECTIVE EACH ADDITIONAL VESSEL  11/20/2018  . IR ANGIOGRAM SELECTIVE EACH ADDITIONAL VESSEL  11/20/2018  . IR ANGIOGRAM SELECTIVE EACH ADDITIONAL VESSEL  11/20/2018  . IR ANGIOGRAM VISCERAL SELECTIVE  11/03/2018  . IR ANGIOGRAM VISCERAL SELECTIVE  11/03/2018  . IR ANGIOGRAM VISCERAL SELECTIVE  11/03/2018  . IR ANGIOGRAM VISCERAL SELECTIVE  11/20/2018  . IR ANGIOGRAM VISCERAL SELECTIVE  11/20/2018  . IR EMBO TUMOR ORGAN ISCHEMIA INFARCT INC GUIDE ROADMAPPING  11/20/2018  . IR GENERIC HISTORICAL  04/02/2016   IR RADIOLOGIST EVAL & MGMT 04/02/2016 Jacqulynn Cadet, MD [facility] INTERV RAD  . IR PARACENTESIS  02/02/2018  . IR PARACENTESIS  04/20/2018  . IR PARACENTESIS  12/03/2018  . IR PARACENTESIS  07/15/2019  . IR PARACENTESIS  02/25/2020  . IR RADIOLOGIST EVAL & MGMT  10/29/2016  . IR RADIOLOGIST EVAL & MGMT  04/29/2017  . IR RADIOLOGIST EVAL & MGMT  11/04/2017  . IR RADIOLOGIST EVAL & MGMT  04/16/2018  . IR RADIOLOGIST EVAL & MGMT  12/08/2018  . IR RADIOLOGIST EVAL & MGMT  03/16/2019  . IR RADIOLOGIST EVAL & MGMT  07/08/2019  . IR RADIOLOGIST EVAL & MGMT  10/07/2019  . IR RADIOLOGIST EVAL & MGMT  01/05/2020  . IR US GUIDE VASC ACCESS LEFT  11/20/2018  . IR US GUIDE VASC ACCESS RIGHT  11/03/2018  . POLYPECTOMY    . UPPER GASTROINTESTINAL ENDOSCOPY      Allergies: Patient has no known allergies.  Medications: Prior to Admission medications   Medication Sig Start Date End Date Taking? Authorizing Provider  alendronate (FOSAMAX) 70 MG tablet Take 70 mg by mouth once a week. On Saturday 03/24/17   [provider]  Blood Glucose Monitoring Suppl ([place]) w/Device KIT OneTouch Verio System  USE TO TEST ONCE D UTD    [provider]  Calcium Carbonate-Vit D-Min (CALCIUM 1200 PO) Take 1,200 mg by mouth every other day.    [provider]  Cholecalciferol (VITAMIN D) 50 MCG (2000 UT) CAPS Take 2,000 Units by mouth at bedtime.    [provider]  fluticasone (FLONASE) 50 MCG/ACT nasal spray Place 1 spray into both nostrils daily.     [provider]  furosemide (LASIX) 40 MG tablet TAKE 1 AND 1/2 TABLETS EVERY DAY 12/30/19   Irene Shipper, MD  Lactobacillus (PROBIOTIC ACIDOPHILUS) CAPS Take 1 capsule by mouth 2 (two) times a week.     [provider]  levothyroxine (SYNTHROID) 50 MCG tablet Take 1 tablet (50 mcg total) by mouth daily. 03/22/20   Renato Shin, MD  metFORMIN (GLUCOPHAGE) 500 MG tablet Take 500 mg by mouth every evening. Take 1 tablet by mouth once daily     [provider]  montelukast (SINGULAIR) 10 MG tablet Take 10 mg by mouth at bedtime.  03/25/14   [provider]  Multiple Vitamin (MULTIVITAMIN) tablet Take 1 tablet by mouth at bedtime.  [provider]  pantoprazole (PROTONIX) 40 MG tablet TAKE 1 TABLET TWICE DAILY 11/03/19   Irene Shipper, MD  spironolactone (ALDACTONE) 100 MG tablet TAKE 2 TABLETS EVERY DAY 12/30/19   Irene Shipper, MD  vitamin C (ASCORBIC ACID) 500 MG tablet Take 500 mg by mouth every other day.    [provider]     Family History  Problem Relation Age of Onset  . Goiter Maternal Grandmother   . Stroke Father 40  . Breast cancer Mother        in 72's  . Colon cancer Neg Hx   . Colon polyps Neg Hx   . Esophageal cancer Neg Hx   . Stomach cancer Neg Hx   . Rectal cancer Neg Hx     Social History   Socioeconomic History  . Marital status: Single    Spouse name: Not on file  . Number of children: 1  . Years of education: Not on file  . Highest education level: Not on file  Occupational History  . Occupation: retired    Comment: works in Chiropodist for [employer]   Tobacco Use  . Smoking status: Former Smoker    Quit date: 05/13/1990    Years since quitting: 29.8  . Smokeless tobacco: Never Used  Vaping Use  . Vaping Use: Never used  Substance and Sexual Activity  . Alcohol use: No    Alcohol/week: 0.0 standard drinks  . Drug use: No  . Sexual activity: Not on file  Other Topics Concern  . Not on file  Social History Narrative  . Not on file   Social Determinants of Health   Financial Resource Strain:   . Difficulty of Paying Living Expenses: Not on file  Food Insecurity:   . Worried About Charity fundraiser in the Last Year: Not on file  . Ran Out of Food in the Last Year: Not on file  Transportation Needs:   . Lack of Transportation (Medical): Not on file  . Lack of Transportation (Non-Medical): Not on file  Physical Activity:   . Days of Exercise per Week: Not on file  . Minutes of Exercise per Session: Not on file  Stress:   . Feeling of Stress : Not on file  Social Connections:   . Frequency of Communication with Friends and Family: Not on file  . Frequency of Social Gatherings with Friends and Family: Not on file  . Attends Religious Services: Not on file  . Active Member of Clubs or Organizations: Not on file  . Attends Archivist Meetings: Not on file  . Marital Status: Not on file    Review of Systems  Review of Systems: A 12 point ROS discussed and pertinent positives are indicated in the HPI above.  All other systems are negative.  Physical Exam No direct physical exam was performed (except for noted visual exam findings with Video Visits).   Vital Signs: There were no vitals taken for this visit.  Imaging: MR ABDOMEN WWO CONTRAST  Result Date: 03/20/2020 CLINICAL DATA:  Cirrhosis. Hepatocellular carcinoma, status post Y-90 radioembolization, restaging. EXAM: MRI ABDOMEN WITHOUT AND WITH CONTRAST TECHNIQUE: Multiplanar multisequence MR imaging of the abdomen was performed both before and after the  administration of intravenous contrast. CONTRAST:  86m EOVIST GADOXETATE DISODIUM 0.25 MOL/L IV SOLN COMPARISON:  Multiple exams, including 12/31/2019 FINDINGS: Lower chest: Possible 4 mm and adjacent 3 mm nodules in the posterior basal segment left lower  lobe, image 47 of series 15. Hepatobiliary: At the site of the prior segment 7 hepatocellular carcinoma that was shown on 04/16/2018, there is a small nonenhancing 1.0 by 0.6 cm residuum for example on image 16 of series 13, with no significant abnormal recurrent arterial phase enhancement to suggest active malignancy. No new focus of hepatocellular carcinoma is identified in the liver. Cirrhosis is present.  Metal artifact from prior cholecystectomy. Pancreas:  Unremarkable Spleen: The spleen measures 13.5 by 9.2 by 6.2 cm (volume = 400 cm^3). Adrenals/Urinary Tract:  Unremarkable Stomach/Bowel: . Prominent stool throughout the colon favors constipation. Vascular/Lymphatic:  Small uphill paraesophageal varices. Other:  Moderate ascites. Musculoskeletal: Nonspecific 0.9 cm lesion in the T12 vertebral body eccentric to the right common no change from 04/16/2018 hence likely an atypical hemangioma. IMPRESSION: 1. At the site of the prior segment 7 hepatocellular carcinoma that was shown on 04/16/2018, there is a small nonenhancing 1.0 by 0.6 cm ablation site with no significant recurrent arterial phase enhancement to suggest active malignancy. 2. Cirrhosis with portal venous hypertension, including moderate ascites and small uphill paraesophageal varices. 3. Prominent stool throughout the colon favors constipation. 4. Nonspecific 0.9 cm lesion in the T12 vertebral body eccentric to the right common no change from 04/16/2018 hence likely an atypical hemangioma. Electronically Signed   By: Van Clines M.D.   On: 03/20/2020 15:03   US THYROID  Result Date: 03/16/2020 CLINICAL DATA:  79 year old female with a history of multinodular thyroid EXAM: THYROID  ULTRASOUND TECHNIQUE: Ultrasound examination of the thyroid gland and adjacent soft tissues was performed. COMPARISON:  03/02/2018 FINDINGS: Parenchymal Echotexture: Markedly heterogenous Isthmus: 1.0 cm Right lobe: 8.0 cm x 4.1 cm x 4.7 cm Left lobe: 6.4 cm x 2.5 cm x 2.4 cm _________________________________________________________ Estimated total number of nodules >/= 1 cm: 1 Number of spongiform nodules >/=  2 cm not described below (TR1): 0 Number of mixed cystic and solid nodules >/= 1.5 cm not described below (TR2): 0 _________________________________________________________ Nodule labeled 1 in the right mid thyroid measures 6.0 cm, unchanged from the prior. This remains TR 2 with mostly cystic change/degenerative change and does not meet criteria for the further surveillance or biopsy. Otherwise, the heterogeneous thyroid is similar to the comparison with no measurable additional nodules. No adenopathy IMPRESSION: Similar appearance of heterogeneously enlarged thyroid compatible with medical thyroid disease. Cystic nodule of the right thyroid does not meet criteria for further surveillance or biopsy. Recommendations follow those established by the new ACR TI-RADS criteria (J Am Coll Radiol 2694;85:462-703). Electronically Signed   By: Corrie Mckusick D.O.   On: 03/16/2020 08:38   IR Paracentesis  Result Date: 02/25/2020 INDICATION: Patient with history of NASH cirrhosis, portal hypertension, right liver mass suspected HCC and prior radioablation therapy, abdominal distension, and recurrent ascites. Request is made for diagnostic and therapeutic paracentesis up to 5 L. EXAM: ULTRASOUND GUIDED DIAGNOSTIC AND THERAPEUTIC PARACENTESIS MEDICATIONS: 15 mL 1% lidocaine COMPLICATIONS: None immediate. PROCEDURE: Informed written consent was obtained from the patient after a discussion of the risks, benefits and alternatives to treatment. A timeout was performed prior to the initiation of the procedure. Initial  ultrasound scanning demonstrates a large amount of ascites within the left lower abdominal quadrant. The left lower abdomen was prepped and draped in the usual sterile fashion. 1% lidocaine was used for local anesthesia. Following this, a 19 gauge, 7-cm, Yueh catheter was introduced. An ultrasound image was saved for documentation purposes. The paracentesis was performed. The catheter was removed and  a dressing was applied. The patient tolerated the procedure well without immediate post procedural complication. FINDINGS: A total of approximately 2.5 L of hazy gold fluid was removed. Samples were sent to the laboratory as requested by the clinical team. IMPRESSION: Successful ultrasound-guided paracentesis yielding 2.5 L of peritoneal fluid. Read by: Earley Abide, PA-C Electronically Signed   By: Ruthann Cancer MD   On: 02/25/2020 15:19    Labs:  CBC: Recent Labs    06/02/19 1355 09/29/19 1050 12/01/19 1319 03/17/20 1130  WBC 3.7* 3.7* 3.3* 4.0  HGB 13.6 14.1 13.8 14.2  HCT 39.8 41.9 41.0 42.1  PLT 133* 131* 129* 145*    COAGS: Recent Labs    07/02/19 1345 09/29/19 1050 03/17/20 1130  INR 1.1 1.1 1.1    BMP: Recent Labs    06/02/19 1355 06/08/19 0939 09/29/19 1050 10/04/19 0920 12/01/19 1319 12/08/19 0849 02/02/20 0913 03/17/20 1130  NA 138   < > 136   < > 139 133* 135 132*  Barnett 4.5   < > 4.6   < > 4.4 4.4 4.1 4.6  CL 103   < > 100   < > 102 102 100 95*  CO2 24   < > 23   < > _0 GLUCOSE 84   < > 98   < > 84 94 104* 91  BUN 14   < > 27*   < > _1 24*  CALCIUM 9.0   < > 9.7   < > 10.5* 9.6 9.3 9.4  CREATININE 0.82   < > 0.98   < > 1.10* 1.02 0.92 0.93  GFRNONAA >60  --  55*  --  48*  --   --  >60  GFRAA >60  --  >60  --  55*  --   --   --    < > = values in this interval not displayed.    LIVER FUNCTION TESTS: Recent Labs    06/02/19 1355 09/29/19 1050 12/01/19 1319 03/17/20 1130  BILITOT 0.7 1.2 1.0 1.4*  AST 26 37 26 33  ALT _2 ALKPHOS 101 78 89 82  PROT 7.4 7.4 7.4 7.9  ALBUMIN 3.9 4.3 3.9 4.2    TUMOR MARKERS: No results for input(s): AFPTM, CEA, CA199, CHROMGRNA in the last 8760 hours.  Assessment and Plan:  Doing exceptionally well with no evidence of residual, recurrent or new disease 16 months status post radiation segmentectomy.  Her MR imaging demonstrates an extremely good complete response to therapy.  I believe we can extend our surveillance window to 6 months.  1.)  Follow-up MRI with gadolinium and accompanying clinic visit in 6 months.  Electronically Signed: Criselda Peaches 03/23/2020, 10:33 AM   I spent a total of 10 Minutes in remote  clinical consultation, greater than 50% of which was counseling/coordinating care for hepatocellular cancer.    Visit type: Audio only (telephone). Audio (no video) only due to patient preference. Alternative for in-person consultation at [facility], [address] [place], [place], [place]. This visit type was conducted due to national recommendations for restrictions regarding the COVID-19 Pandemic (e.g. social distancing).  This format is felt to be most appropriate for this patient at this time.  All issues noted in this document were discussed and addressed.

## 2020-03-27 MED ORDER — PANTOPRAZOLE SODIUM 40 MG PO TBEC: 40.0000 mg | DELAYED_RELEASE_TABLET | Freq: Two times a day (BID) | ORAL | 1 refills | Status: DC

## 2020-03-27 NOTE — Telephone Encounter (Signed)
Refill Pantoprazole

## 2020-04-03 ENCOUNTER — Other Ambulatory Visit: Payer: Self-pay

## 2020-04-03 DIAGNOSIS — K259 Gastric ulcer, unspecified as acute or chronic, without hemorrhage or perforation: Secondary | ICD-10-CM

## 2020-04-03 MED ORDER — PANTOPRAZOLE SODIUM 40 MG PO TBEC: 40.0000 mg | DELAYED_RELEASE_TABLET | Freq: Two times a day (BID) | ORAL | 0 refills | Status: DC

## 2020-04-03 NOTE — Progress Notes (Signed)
Faxed refill request from [facility]

## 2020-04-05 ENCOUNTER — Other Ambulatory Visit (INDEPENDENT_AMBULATORY_CARE_PROVIDER_SITE_OTHER): Payer: Medicare Other

## 2020-04-05 DIAGNOSIS — R188 Other ascites: Secondary | ICD-10-CM | POA: Diagnosis not present

## 2020-04-05 DIAGNOSIS — K7460 Unspecified cirrhosis of liver: Secondary | ICD-10-CM

## 2020-04-05 LAB — BASIC METABOLIC PANEL
BUN: 20 mg/dL (ref 6–23)
CO2: 29 mEq/L (ref 19–32)
Calcium: 9.7 mg/dL (ref 8.4–10.5)
Chloride: 99 mEq/L (ref 96–112)
Creatinine, Ser: 0.98 mg/dL (ref 0.40–1.20)
GFR: 54.68 mL/min — ABNORMAL LOW (ref >60.00)
Glucose, Bld: 109 mg/dL — ABNORMAL HIGH (ref 70–99)
Potassium: 4.5 mEq/L (ref 3.5–5.1)
Sodium: 134 mEq/L — ABNORMAL LOW (ref 135–145)

## 2020-04-10 ENCOUNTER — Other Ambulatory Visit: Payer: Self-pay

## 2020-04-10 DIAGNOSIS — K7460 Unspecified cirrhosis of liver: Secondary | ICD-10-CM

## 2020-04-24 DIAGNOSIS — E559 Vitamin D deficiency, unspecified: Secondary | ICD-10-CM | POA: Diagnosis not present

## 2020-04-24 DIAGNOSIS — J302 Other seasonal allergic rhinitis: Secondary | ICD-10-CM | POA: Diagnosis not present

## 2020-04-24 DIAGNOSIS — E041 Nontoxic single thyroid nodule: Secondary | ICD-10-CM | POA: Diagnosis not present

## 2020-04-24 DIAGNOSIS — R188 Other ascites: Secondary | ICD-10-CM | POA: Diagnosis not present

## 2020-04-24 DIAGNOSIS — C220 Liver cell carcinoma: Secondary | ICD-10-CM | POA: Diagnosis not present

## 2020-04-24 DIAGNOSIS — I10 Essential (primary) hypertension: Secondary | ICD-10-CM | POA: Diagnosis not present

## 2020-04-24 DIAGNOSIS — Z7984 Long term (current) use of oral hypoglycemic drugs: Secondary | ICD-10-CM | POA: Diagnosis not present

## 2020-04-24 DIAGNOSIS — E1169 Type 2 diabetes mellitus with other specified complication: Secondary | ICD-10-CM | POA: Diagnosis not present

## 2020-04-24 DIAGNOSIS — E039 Hypothyroidism, unspecified: Secondary | ICD-10-CM | POA: Diagnosis not present

## 2020-04-24 DIAGNOSIS — D696 Thrombocytopenia, unspecified: Secondary | ICD-10-CM | POA: Diagnosis not present

## 2020-04-24 DIAGNOSIS — K7581 Nonalcoholic steatohepatitis (NASH): Secondary | ICD-10-CM | POA: Diagnosis not present

## 2020-04-25 DIAGNOSIS — Z23 Encounter for immunization: Secondary | ICD-10-CM | POA: Diagnosis not present

## 2020-05-01 ENCOUNTER — Encounter: Payer: Self-pay | Admitting: Internal Medicine

## 2020-05-01 ENCOUNTER — Ambulatory Visit (INDEPENDENT_AMBULATORY_CARE_PROVIDER_SITE_OTHER): Payer: Medicare Other | Admitting: Internal Medicine

## 2020-05-01 VITALS — BP 108/64 | HR 74 | Ht 64.0 in | Wt 176.0 lb

## 2020-05-01 DIAGNOSIS — K259 Gastric ulcer, unspecified as acute or chronic, without hemorrhage or perforation: Secondary | ICD-10-CM

## 2020-05-01 DIAGNOSIS — K7460 Unspecified cirrhosis of liver: Secondary | ICD-10-CM | POA: Diagnosis not present

## 2020-05-01 DIAGNOSIS — K429 Umbilical hernia without obstruction or gangrene: Secondary | ICD-10-CM | POA: Diagnosis not present

## 2020-05-01 DIAGNOSIS — K7581 Nonalcoholic steatohepatitis (NASH): Secondary | ICD-10-CM

## 2020-05-01 MED ORDER — PANTOPRAZOLE SODIUM 40 MG PO TBEC: 40.0000 mg | DELAYED_RELEASE_TABLET | Freq: Two times a day (BID) | ORAL | 11 refills | Status: DC

## 2020-05-01 MED ORDER — FUROSEMIDE 40 MG PO TABS
40.0000 mg | ORAL_TABLET | Freq: Every day | ORAL | 11 refills | Status: DC
Start: 2020-05-01 — End: 2021-06-12

## 2020-05-01 MED ORDER — SPIRONOLACTONE 100 MG PO TABS
200.0000 mg | ORAL_TABLET | Freq: Every day | ORAL | 11 refills | Status: DC
Start: 2020-05-01 — End: 2020-06-30

## 2020-05-01 NOTE — Patient Instructions (Addendum)
If you are age 80 or older, your body mass index should be between 23-30. Your Body mass index is 30.21 kg/m. If this is out of the aforementioned range listed, please consider follow up with your Primary Care Provider.  If you are age 40 or younger, your body mass index should be between 19-25. Your Body mass index is 30.21 kg/m. If this is out of the aformentioned range listed, please consider follow up with your Primary Care Provider.   Thank you for choosing me and [facility].  Scarlette Shorts, MD

## 2020-05-03 NOTE — Progress Notes (Signed)
HISTORY OF PRESENT ILLNESS:  Crystal Barnett is a 79 y.o. female with the following:  1.  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  On diuretics.  Infrequent paracentesis.  Current MELD score 9 2.  Indeterminate liver lesion with normal AFP status post radio embolization therapy July 2020 for possible HCC.  No evidence for malignant lesions on MRI of the liver March 20, 2020.  Followed by IR 3.  Symptomatic umbilical hernia.  She wears an abdominal corset.  Evaluated by Dr. Greer Pickerel March 22, 2020.  He is not keen on operating and will solicit opinion to the point of his partners with hepatobiliary expertise 4.  Morbid obesity 5.  History of gastric ulceration associated with Helicobacter pylori.  Improved on PPI after H. pylori eradication therapy-on follow-up endoscopy. 6.  GERD.  Symptoms controlled on PPI  Patient was last seen in this office Sep 21, 1999.  See that dictation.  She has declined paracentesis with 2.5 L of fluid February 25, 2020.  No SBP.  Continues on Aldactone 200 mg daily and Lasix 60 mg daily.  By our scales, her weight is up 6 pounds since her last visit.  She does not feel like she has had recurrent ascites.  Her umbilical hernia continues to bother her when she stands.  She is concerned about rupture.  Last blood work November 2021 reviewed with calculated meld score of 9.  She has no new complaints.  Continues with fatigue.  Has completed her Covid vaccination series and booster.  REVIEW OF SYSTEMS:  All non-GI ROS negative unless otherwise stated in the HPI except for back pain, hearing problems  Past Medical History:  Diagnosis Date  . ALLERGIC RHINITIS 01/12/2008  . Allergy   . Cancer [facility])    liver cancer hepatocellular carcinoma per pt   . Cataract    bilateral  . Colon polyps    hyperplastic  . Constipation    ongoing issues per pt  . Diabetes ([place])   . Esophageal varices ([place])   . Fatty liver   . GOITER, MULTINODULAR  01/12/2008  . Hepatic cirrhosis ([place])   . History of radiation therapy 11/20/2018   x 1 radiation for liver cancer  . Hypertension    under control  . HYPOTHYROIDISM 09/07/2008  . 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 Liver cirrhosis secondary to NASH ([place])   . Osteoporosis   . 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 Splenomegaly   . Thrombocytopenia ([place])    Appears chronic from at least 2014  . Vitamin D deficiency     Past Surgical History:  Procedure Laterality Date  . arthroscopic left knee  2005  . BREAST BIOPSY Left 2017  . BREAST EXCISIONAL BIOPSY Right 2008  . BREAST SURGERY  2008   Breast Biopsy   . CHOLECYSTECTOMY  1985  . COLONOSCOPY    . IR ANGIOGRAM SELECTIVE EACH ADDITIONAL VESSEL  11/03/2018  . IR ANGIOGRAM SELECTIVE EACH ADDITIONAL VESSEL  11/03/2018  . IR ANGIOGRAM SELECTIVE EACH ADDITIONAL VESSEL  11/03/2018  . IR ANGIOGRAM SELECTIVE EACH ADDITIONAL VESSEL  11/03/2018  . IR ANGIOGRAM SELECTIVE EACH ADDITIONAL VESSEL  11/20/2018  . IR ANGIOGRAM SELECTIVE EACH ADDITIONAL VESSEL  11/20/2018  . IR ANGIOGRAM SELECTIVE EACH ADDITIONAL VESSEL  11/20/2018  . IR ANGIOGRAM VISCERAL SELECTIVE  11/03/2018  . IR ANGIOGRAM VISCERAL SELECTIVE  11/03/2018  . IR ANGIOGRAM VISCERAL  SELECTIVE  11/03/2018  . IR ANGIOGRAM VISCERAL SELECTIVE  11/20/2018  . IR ANGIOGRAM VISCERAL SELECTIVE  11/20/2018  . IR EMBO TUMOR ORGAN ISCHEMIA INFARCT INC GUIDE ROADMAPPING  11/20/2018  . IR GENERIC HISTORICAL  04/02/2016   IR RADIOLOGIST EVAL & MGMT 04/02/2016 Jacqulynn Cadet, MD [facility] INTERV RAD  . IR PARACENTESIS  02/02/2018  . IR PARACENTESIS  04/20/2018  . IR PARACENTESIS  12/03/2018  . IR PARACENTESIS  07/15/2019  . IR PARACENTESIS  02/25/2020  . IR RADIOLOGIST EVAL & MGMT  10/29/2016  . IR RADIOLOGIST EVAL & MGMT  04/29/2017  . IR RADIOLOGIST EVAL & MGMT  11/04/2017  . IR RADIOLOGIST EVAL & MGMT  04/16/2018  . IR RADIOLOGIST EVAL & MGMT  12/08/2018  . IR  RADIOLOGIST EVAL & MGMT  03/16/2019  . IR RADIOLOGIST EVAL & MGMT  07/08/2019  . IR RADIOLOGIST EVAL & MGMT  10/07/2019  . IR RADIOLOGIST EVAL & MGMT  01/05/2020  . IR RADIOLOGIST EVAL & MGMT  03/23/2020  . IR US GUIDE VASC ACCESS LEFT  11/20/2018  . IR US GUIDE VASC ACCESS RIGHT  11/03/2018  . POLYPECTOMY    . UPPER GASTROINTESTINAL ENDOSCOPY      Social History SOUMYA COLSON  reports that she quit smoking about 29 years ago. She has never used smokeless tobacco. She reports that she does not drink alcohol and does not use drugs.  family history includes Breast cancer in her mother; Goiter in her maternal grandmother; Stroke (age of onset: 38) in her father.  No Known Allergies     PHYSICAL EXAMINATION: Vital signs: BP 108/64   Pulse 74   Ht 5' 4"  (1.626 m)   Wt 176 lb (79.8 kg)   SpO2 95%   BMI 30.21 kg/m   Constitutional: Pleasant, chronically ill-appearing, no acute distress Psychiatric: alert and oriented x3, cooperative Eyes: extraocular movements intact, anicteric, conjunctiva pink Mouth: Mask Neck: supple no lymphadenopathy Cardiovascular: heart regular rate and rhythm, no murmur Lungs: clear to auscultation bilaterally Abdomen: soft, nontender, nondistended, obese, possible nontense ascites, no peritoneal signs, normal bowel sounds, no organomegaly.  Discolored umbilical hernia with small superficial scab.  Slightly uncomfortable but no significant tenderness Rectal: Omitted Extremities: no clubbing or cyanosis.  Trace lower extremity edema bilaterally.  Varicose veins Skin: no additional clinically relevant lesions on visible extremities Neuro: No focal deficits. No asterixis.    ASSESSMENT:  1.  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  On diuretics.  Infrequent paracentesis.  Current MELD score 9.  No varices on most recent EGD November 2020 2.  Indeterminate liver lesion with normal AFP status post radio embolization therapy July 2020 for  possible HCC.  No evidence for malignant lesions on MRI of the liver March 20, 2020.  Followed by IR 3.  Symptomatic umbilical hernia.  She wears an abdominal corset.  Evaluated by Dr. Greer Pickerel March 22, 2020.  He is not keen on operating and will solicit opinion to the point of his partners with hepatobiliary expertise 4.  Morbid obesity 5.  History of gastric ulceration associated with Helicobacter pylori.  Improved on PPI after H. pylori eradication therapy-on follow-up endoscopy. 6.  GERD.  Symptoms controlled on PPI 7.  Colonoscopy elsewhere January 2008.  Negative for neoplasia.  Normal exam.   PLAN:  1.  Continue Aldactone 200 mg daily.  Prescription refilled 2.  Continue Lasix 60 mg daily.  Prescription refilled 3.  Continue pantoprazole 40 mg once or twice  daily.  Prescription refilled 4.  Continue to wear abdominal corset 5.  Follow through with surgical second opinion 6.  Routine GI follow-up 6 months 7.  Periodic laboratories as directed 8.  Repeat screening endoscopy at that time, for varices.  A total time of 45 minutes was spent preparing to see the patient, reviewing test and x-rays, obtaining comprehensive history and performing comprehensive physical examination.  Counseling the patient regarding her multiple above listed issues, ordering medications and follow-up laboratories.  Finally, documenting clinical information in the health record

## 2020-05-26 ENCOUNTER — Emergency Department (HOSPITAL_COMMUNITY): Payer: Medicare Other

## 2020-05-26 ENCOUNTER — Other Ambulatory Visit: Payer: Self-pay

## 2020-05-26 ENCOUNTER — Encounter (HOSPITAL_COMMUNITY): Payer: Self-pay | Admitting: *Deleted

## 2020-05-26 ENCOUNTER — Emergency Department (HOSPITAL_COMMUNITY)
Admission: EM | Admit: 2020-05-26 | Discharge: 2020-05-26 | Disposition: A | Discharge status: Home / Self Care | Payer: Medicare Other | Attending: Emergency Medicine | Admitting: Emergency Medicine

## 2020-05-26 DIAGNOSIS — Z87891 Personal history of nicotine dependence: Secondary | ICD-10-CM | POA: Diagnosis not present

## 2020-05-26 DIAGNOSIS — Y92019 Unspecified place in single-family (private) house as the place of occurrence of the external cause: Secondary | ICD-10-CM | POA: Diagnosis not present

## 2020-05-26 DIAGNOSIS — W19XXXA Unspecified fall, initial encounter: Secondary | ICD-10-CM

## 2020-05-26 DIAGNOSIS — U071 COVID-19: Secondary | ICD-10-CM | POA: Diagnosis not present

## 2020-05-26 DIAGNOSIS — E119 Type 2 diabetes mellitus without complications: Secondary | ICD-10-CM | POA: Insufficient documentation

## 2020-05-26 DIAGNOSIS — R911 Solitary pulmonary nodule: Secondary | ICD-10-CM | POA: Diagnosis not present

## 2020-05-26 DIAGNOSIS — R55 Syncope and collapse: Secondary | ICD-10-CM | POA: Insufficient documentation

## 2020-05-26 DIAGNOSIS — Z8505 Personal history of malignant neoplasm of liver: Secondary | ICD-10-CM | POA: Diagnosis not present

## 2020-05-26 DIAGNOSIS — E039 Hypothyroidism, unspecified: Secondary | ICD-10-CM | POA: Insufficient documentation

## 2020-05-26 DIAGNOSIS — S0990XA Unspecified injury of head, initial encounter: Secondary | ICD-10-CM | POA: Diagnosis present

## 2020-05-26 DIAGNOSIS — I10 Essential (primary) hypertension: Secondary | ICD-10-CM | POA: Diagnosis not present

## 2020-05-26 DIAGNOSIS — Z7984 Long term (current) use of oral hypoglycemic drugs: Secondary | ICD-10-CM | POA: Diagnosis not present

## 2020-05-26 DIAGNOSIS — W0110XA Fall on same level from slipping, tripping and stumbling with subsequent striking against unspecified object, initial encounter: Secondary | ICD-10-CM | POA: Diagnosis not present

## 2020-05-26 DIAGNOSIS — S0101XA Laceration without foreign body of scalp, initial encounter: Secondary | ICD-10-CM | POA: Diagnosis not present

## 2020-05-26 DIAGNOSIS — Z79899 Other long term (current) drug therapy: Secondary | ICD-10-CM | POA: Insufficient documentation

## 2020-05-26 DIAGNOSIS — J8410 Pulmonary fibrosis, unspecified: Secondary | ICD-10-CM | POA: Diagnosis not present

## 2020-05-26 DIAGNOSIS — S199XXA Unspecified injury of neck, initial encounter: Secondary | ICD-10-CM | POA: Diagnosis not present

## 2020-05-26 DIAGNOSIS — M25512 Pain in left shoulder: Secondary | ICD-10-CM | POA: Diagnosis not present

## 2020-05-26 LAB — BASIC METABOLIC PANEL
Anion gap: 13 (ref 5–15)
BUN: 16 mg/dL (ref 8–23)
CO2: 21 mmol/L — ABNORMAL LOW (ref 22–32)
Calcium: 9.6 mg/dL (ref 8.9–10.3)
Chloride: 99 mmol/L (ref 98–111)
Creatinine, Ser: 0.89 mg/dL (ref 0.44–1.00)
GFR, Estimated: >60 mL/min (ref >60)
Glucose, Bld: 111 mg/dL — ABNORMAL HIGH (ref 70–99)
Potassium: 4.5 mmol/L (ref 3.5–5.1)
Sodium: 133 mmol/L — ABNORMAL LOW (ref 135–145)

## 2020-05-26 LAB — CBC
HCT: 38 % (ref 36.0–46.0)
Hemoglobin: 12.9 g/dL (ref 12.0–15.0)
MCH: 31.1 pg (ref 26.0–34.0)
MCHC: 33.9 g/dL (ref 30.0–36.0)
MCV: 91.6 fL (ref 80.0–100.0)
Platelets: 118 10*3/uL — ABNORMAL LOW (ref 150–400)
RBC: 4.15 MIL/uL (ref 3.87–5.11)
RDW: 12.8 % (ref 11.5–15.5)
WBC: 3.2 10*3/uL — ABNORMAL LOW (ref 4.0–10.5)
nRBC: 0 % (ref 0.0–0.2)

## 2020-05-26 LAB — RESP PANEL BY RT-PCR (FLU A&B, COVID) ARPGX2
Influenza A by PCR: NEGATIVE
Influenza B by PCR: NEGATIVE
SARS Coronavirus 2 by RT PCR: POSITIVE — AB

## 2020-05-26 LAB — CBG MONITORING, ED: Glucose-Capillary: 134 mg/dL — ABNORMAL HIGH (ref 70–99)

## 2020-05-26 NOTE — ED Provider Notes (Signed)
[facility] DEPT Provider Note   CSN: [id number] Arrival date & time: 05/26/20  1629     History Chief Complaint  Patient presents with  . Head Laceration  . Loss of Consciousness    Crystal Barnett is a 80 y.o. female.  80 year old female with prior history as detailed below presents for evaluation following near syncope with fall. She fell at home. She struck her head and now has scalp laceration. No reported LOC. She reports fatigue and mild cough for the last 5 days. She is covid vaccinated (x3).   Tetanus up to date.   The history is provided by the patient and medical records.  Head Laceration This is a new problem. The current episode started 3 to 5 hours ago. The problem occurs rarely. The problem has not changed since onset.Pertinent negatives include no chest pain, no abdominal pain, no headaches and no shortness of breath. Nothing aggravates the symptoms. Nothing relieves the symptoms.  Loss of Consciousness Associated symptoms: no chest pain, no headaches and no shortness of breath        Past Medical History:  Diagnosis Date  . ALLERGIC RHINITIS 01/12/2008  . Allergy   . Cancer [facility])    liver cancer hepatocellular carcinoma per pt   . Cataract    bilateral  . Colon polyps    hyperplastic  . Constipation    ongoing issues per pt  . Diabetes ([place])   . Esophageal varices ([place])   . Fatty liver   . GOITER, MULTINODULAR 01/12/2008  . Hepatic cirrhosis ([place])   . History of radiation therapy 11/20/2018   x 1 radiation for liver cancer  . Hypertension    under control  . HYPOTHYROIDISM 09/07/2008  . 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 Liver cirrhosis secondary to NASH ([place])   . Osteoporosis   . 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 Splenomegaly   . Thrombocytopenia ([place])    Appears chronic from at least 2014  . Vitamin D deficiency     Patient Active Problem List   Diagnosis Date  Noted  . Menopausal syndrome 04/16/2018  . Morbid obesity with BMI of 40.0-44.9, adult ([place]) 04/01/2016  . Abnormal finding on mammography 08/29/2015  . 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 Lesion of vulva 06/19/2015  . Thrombocytopenia ([place]) 04/19/2015  . Liver lesion, left lobe 04/19/2015  . Liver cirrhosis secondary to NASH ([place]) 04/19/2015  . Arrhythmia 04/23/2013  . 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 Abnormal cervical Papanicolaou smear 12/29/2009  . Hypothyroidism 09/07/2008  . GOITER, MULTINODULAR 01/12/2008  . ALLERGIC RHINITIS 01/12/2008  . CHICKENPOX, HX OF 01/12/2008    Past Surgical History:  Procedure Laterality Date  . arthroscopic left knee  2005  . BREAST BIOPSY Left 2017  . BREAST EXCISIONAL BIOPSY Right 2008  . BREAST SURGERY  2008   Breast Biopsy   . CHOLECYSTECTOMY  1985  . COLONOSCOPY    . IR ANGIOGRAM SELECTIVE EACH ADDITIONAL VESSEL  11/03/2018  . IR ANGIOGRAM SELECTIVE EACH ADDITIONAL VESSEL  11/03/2018  . IR ANGIOGRAM SELECTIVE EACH ADDITIONAL VESSEL  11/03/2018  . IR ANGIOGRAM SELECTIVE EACH ADDITIONAL VESSEL  11/03/2018  . IR ANGIOGRAM SELECTIVE EACH ADDITIONAL VESSEL  11/20/2018  . IR ANGIOGRAM SELECTIVE EACH ADDITIONAL VESSEL  11/20/2018  . IR ANGIOGRAM SELECTIVE EACH ADDITIONAL VESSEL  11/20/2018  . IR ANGIOGRAM VISCERAL SELECTIVE  11/03/2018  .  IR ANGIOGRAM VISCERAL SELECTIVE  11/03/2018  . IR ANGIOGRAM VISCERAL SELECTIVE  11/03/2018  . IR ANGIOGRAM VISCERAL SELECTIVE  11/20/2018  . IR ANGIOGRAM VISCERAL SELECTIVE  11/20/2018  . IR EMBO TUMOR ORGAN ISCHEMIA INFARCT INC GUIDE ROADMAPPING  11/20/2018  . IR GENERIC HISTORICAL  04/02/2016   IR RADIOLOGIST EVAL & MGMT 04/02/2016 Jacqulynn Cadet, MD [facility] INTERV RAD  . IR PARACENTESIS  02/02/2018  . IR PARACENTESIS  04/20/2018  . IR PARACENTESIS  12/03/2018  . IR PARACENTESIS  07/15/2019  . IR PARACENTESIS  02/25/2020  . IR RADIOLOGIST EVAL & MGMT  10/29/2016  . IR RADIOLOGIST EVAL & MGMT  04/29/2017  . IR  RADIOLOGIST EVAL & MGMT  11/04/2017  . IR RADIOLOGIST EVAL & MGMT  04/16/2018  . IR RADIOLOGIST EVAL & MGMT  12/08/2018  . IR RADIOLOGIST EVAL & MGMT  03/16/2019  . IR RADIOLOGIST EVAL & MGMT  07/08/2019  . IR RADIOLOGIST EVAL & MGMT  10/07/2019  . IR RADIOLOGIST EVAL & MGMT  01/05/2020  . IR RADIOLOGIST EVAL & MGMT  03/23/2020  . IR US GUIDE VASC ACCESS LEFT  11/20/2018  . IR US GUIDE VASC ACCESS RIGHT  11/03/2018  . POLYPECTOMY    . UPPER GASTROINTESTINAL ENDOSCOPY       OB History   No obstetric history on file.     Family History  Problem Relation Age of Onset  . Goiter Maternal Grandmother   . Stroke Father 91  . Breast cancer Mother        in 41's  . Colon cancer Neg Hx   . Colon polyps Neg Hx   . Esophageal cancer Neg Hx   . Stomach cancer Neg Hx   . Rectal cancer Neg Hx     Social History   Tobacco Use  . Smoking status: Former Smoker    Quit date: 05/13/1990    Years since quitting: 30.0  . Smokeless tobacco: Never Used  Vaping Use  . Vaping Use: Never used  Substance Use Topics  . Alcohol use: No    Alcohol/week: 0.0 standard drinks  . Drug use: No    Home Medications Prior to Admission medications   Medication Sig Start Date End Date Taking? Authorizing Provider  alendronate (FOSAMAX) 70 MG tablet Take 70 mg by mouth once a week. On Saturday 03/24/17   [provider]  Blood Glucose Monitoring Suppl ([place]) w/Device KIT OneTouch Verio System  USE TO TEST ONCE D UTD    [provider]  Calcium Carbonate-Vit D-Min (CALCIUM 1200 PO) Take 1,200 mg by mouth every other day.    [provider]  Cholecalciferol (VITAMIN D) 50 MCG (2000 UT) CAPS Take 2,000 Units by mouth at bedtime.    [provider]  fluticasone (FLONASE) 50 MCG/ACT nasal spray Place 1 spray into both nostrils daily.     [provider]  furosemide (LASIX) 40 MG tablet Take 1 tablet (40 mg total) by mouth daily. 05/01/20   Irene Shipper, MD  Lactobacillus (PROBIOTIC ACIDOPHILUS) CAPS Take 1 capsule by mouth 2 (two) times a week.     [provider]  levothyroxine (SYNTHROID) 50 MCG tablet Take 1 tablet (50 mcg total) by mouth daily. 03/22/20   Renato Shin, MD  metFORMIN (GLUCOPHAGE) 500 MG tablet Take 500 mg by mouth every evening. Take 1 tablet by mouth once daily    [provider]  montelukast (SINGULAIR) 10 MG tablet Take 10 mg  by mouth at bedtime.  03/25/14   [provider]  Multiple Vitamin (MULTIVITAMIN) tablet Take 1 tablet by mouth at bedtime.     [provider]  pantoprazole (PROTONIX) 40 MG tablet Take 1 tablet (40 mg total) by mouth 2 (two) times daily. 05/01/20   Irene Shipper, MD  polyethylene glycol (MIRALAX / GLYCOLAX) 17 g packet Take 17 g by mouth daily.    [provider]  spironolactone (ALDACTONE) 100 MG tablet Take 2 tablets (200 mg total) by mouth daily. 05/01/20   Irene Shipper, MD  vitamin C (ASCORBIC ACID) 500 MG tablet Take 500 mg by mouth every other day.    [provider]    Allergies    Patient has no known allergies.  Review of Systems   Review of Systems  Respiratory: Negative for shortness of breath.   Cardiovascular: Positive for syncope. Negative for chest pain.  Gastrointestinal: Negative for abdominal pain.  Neurological: Negative for headaches.  All other systems reviewed and are negative.   Physical Exam Updated Vital Signs BP 140/75   Pulse 82   Temp 98.4 F (36.9 C) (Oral)   Resp 16   Ht 5' 4.5" (1.638 m)   Wt 77.1 kg   SpO2 99%   BMI 28.73 kg/m   Physical Exam Vitals and nursing note reviewed.  Constitutional:      General: She is not in acute distress.    Appearance: She is well-developed and well-nourished.  HENT:     Head: Normocephalic.     Comments: 3 cm laceration to left superior scalp  No active bleeding    Mouth/Throat:     Mouth: Oropharynx is clear and moist.  Eyes:     Extraocular  Movements: EOM normal.     Conjunctiva/sclera: Conjunctivae normal.     Pupils: Pupils are equal, round, and reactive to light.  Cardiovascular:     Rate and Rhythm: Normal rate and regular rhythm.     Heart sounds: Normal heart sounds.  Pulmonary:     Effort: Pulmonary effort is normal. No respiratory distress.     Breath sounds: Normal breath sounds.  Abdominal:     General: There is no distension.     Palpations: Abdomen is soft.     Tenderness: There is no abdominal tenderness.  Musculoskeletal:        General: No deformity or edema. Normal range of motion.     Cervical back: Normal range of motion and neck supple.  Skin:    General: Skin is warm and dry.  Neurological:     General: No focal deficit present.     Mental Status: She is alert and oriented to person, place, and time. Mental status is at baseline.     Cranial Nerves: No cranial nerve deficit.     Sensory: No sensory deficit.     Motor: No weakness.     Coordination: Coordination normal.  Psychiatric:        Mood and Affect: Mood and affect normal.     ED Results / Procedures / Treatments   Labs (all labs ordered are listed, but only abnormal results are displayed) Labs Reviewed  RESP PANEL BY RT-PCR (FLU A&B, COVID) ARPGX2 - Abnormal; Notable for the following components:      Result Value   SARS Coronavirus 2 by RT PCR POSITIVE (*)    All other components within normal limits  BASIC METABOLIC PANEL - Abnormal; Notable for the following components:  Sodium 133 (*)    CO2 21 (*)    Glucose, Bld 111 (*)    All other components within normal limits  CBC - Abnormal; Notable for the following components:   WBC 3.2 (*)    Platelets 118 (*)    All other components within normal limits  CBG MONITORING, ED - Abnormal; Notable for the following components:   Glucose-Capillary 134 (*)    All other components within normal limits  URINALYSIS, ROUTINE W REFLEX MICROSCOPIC    EKG EKG  Interpretation  Date/Time:  Friday May 26 2020 16:47:19 EST Ventricular Rate:  83 PR Interval:    QRS Duration: 95 QT Interval:  360 QTC Calculation: 423 R Axis:   46 Text Interpretation: Sinus rhythm Low voltage, precordial leads No significant change since prior 9/11 Confirmed by Aletta Edouard [phone]) on 05/26/2020 4:51:32 PM   Radiology CT Head Wo Contrast  Result Date: 05/26/2020 CLINICAL DATA:  Status post fall today with a blow to the head. Initial encounter. EXAM: CT HEAD WITHOUT CONTRAST CT CERVICAL SPINE WITHOUT CONTRAST TECHNIQUE: Multidetector CT imaging of the head and cervical spine was performed following the standard protocol without intravenous contrast. Multiplanar CT image reconstructions of the cervical spine were also generated. COMPARISON:  None. FINDINGS: CT HEAD FINDINGS Brain: No evidence of acute infarction, hemorrhage, hydrocephalus, extra-axial collection or mass lesion/mass effect. Vascular: No hyperdense vessel or unexpected calcification. Skull: Intact.  No focal lesion. Sinuses/Orbits: Status post cataract surgery.  Otherwise negative. Other: Scalp laceration near the vertex on the left noted. CT CERVICAL SPINE FINDINGS Alignment: Maintained. Skull base and vertebrae: No acute fracture. No primary bone lesion or focal pathologic process. Soft tissues and spinal canal: No prevertebral fluid or swelling. No visible canal hematoma. Disc levels:  Mild loss of disc space height is seen at C4-5. Upper chest: Lung apices are clear. 2.9 x 3.3 cm low attenuating lesion in the right lobe of the thyroid is noted. This has been evaluated with ultrasound in the past. Not clinically significant; no follow-up imaging recommended (ref: J Am Coll Radiol. 2015 Feb;12(2): 143-50). Other: None. IMPRESSION: Scalp laceration near the vertex on the left. No other acute abnormality head or cervical spine. Mild degenerative disease C4-5. Electronically Signed   By: Inge Rise M.D.    On: 05/26/2020 19:12   CT Cervical Spine Wo Contrast  Result Date: 05/26/2020 CLINICAL DATA:  Status post fall today with a blow to the head. Initial encounter. EXAM: CT HEAD WITHOUT CONTRAST CT CERVICAL SPINE WITHOUT CONTRAST TECHNIQUE: Multidetector CT imaging of the head and cervical spine was performed following the standard protocol without intravenous contrast. Multiplanar CT image reconstructions of the cervical spine were also generated. COMPARISON:  None. FINDINGS: CT HEAD FINDINGS Brain: No evidence of acute infarction, hemorrhage, hydrocephalus, extra-axial collection or mass lesion/mass effect. Vascular: No hyperdense vessel or unexpected calcification. Skull: Intact.  No focal lesion. Sinuses/Orbits: Status post cataract surgery.  Otherwise negative. Other: Scalp laceration near the vertex on the left noted. CT CERVICAL SPINE FINDINGS Alignment: Maintained. Skull base and vertebrae: No acute fracture. No primary bone lesion or focal pathologic process. Soft tissues and spinal canal: No prevertebral fluid or swelling. No visible canal hematoma. Disc levels:  Mild loss of disc space height is seen at C4-5. Upper chest: Lung apices are clear. 2.9 x 3.3 cm low attenuating lesion in the right lobe of the thyroid is noted. This has been evaluated with ultrasound in the past. Not clinically significant; no follow-up  imaging recommended (ref: J Am Coll Radiol. 2015 Feb;12(2): 143-50). Other: None. IMPRESSION: Scalp laceration near the vertex on the left. No other acute abnormality head or cervical spine. Mild degenerative disease C4-5. Electronically Signed   By: Inge Rise M.D.   On: 05/26/2020 19:12   DG Chest Port 1 View  Result Date: 05/26/2020 CLINICAL DATA:  Pain post fall EXAM: PORTABLE CHEST 1 VIEW COMPARISON:  None. FINDINGS: The heart size and mediastinal contours are within normal limits. Small calcified left lower lung nodule likely granulomas. Both lungs are clear. The visualized  skeletal structures are unremarkable. IMPRESSION: No active disease. Electronically Signed   By: Donavan Foil M.D.   On: 05/26/2020 19:05   DG Shoulder Left  Result Date: 05/26/2020 CLINICAL DATA:  Status post fall today.  Left shoulder pain. EXAM: LEFT SHOULDER - 2+ VIEW COMPARISON:  None. FINDINGS: There is no acute bony or joint abnormality. Moderate acromioclavicular osteoarthritis noted. Imaged lung parenchyma and ribs demonstrate a calcified granuloma in the lower lobe. IMPRESSION: No acute abnormality. Acromioclavicular osteoarthritis. Electronically Signed   By: Inge Rise M.D.   On: 05/26/2020 18:54    Procedures .Marland KitchenLaceration Repair  Date/Time: 05/26/2020 8:37 PM Performed by: Valarie Merino, MD Authorized by: Valarie Merino, MD   Consent:    Consent obtained:  Verbal   Consent given by:  Patient   Risks, benefits, and alternatives were discussed: yes     Risks discussed:  Infection, need for additional repair, poor wound healing, poor cosmetic result, pain, retained foreign body, vascular damage, tendon damage and nerve damage   Alternatives discussed:  No treatment Universal protocol:    Procedure explained and questions answered to patient or proxy's satisfaction: yes     Relevant documents present and verified: yes     Test results available: yes     Imaging studies available: yes     Site/side marked: yes     Patient identity confirmed:  Verbally with patient Anesthesia:    Anesthesia method:  None Laceration details:    Location:  Scalp   Scalp location:  L parietal   Length (cm):  3 Pre-procedure details:    Preparation:  Patient was prepped and draped in usual sterile fashion Exploration:    Hemostasis achieved with:  Direct pressure   Imaging outcome: foreign body not noted     Wound exploration: wound explored through full range of motion and entire depth of wound visualized     Contaminated: no   Treatment:    Area cleansed with:  Saline    Amount of cleaning:  Standard   Visualized foreign bodies/material removed: no     Debridement:  None   Undermining:  None Skin repair:    Repair method:  Staples   Number of staples:  4 Approximation:    Approximation:  Close Repair type:    Repair type:  Simple Post-procedure details:    Dressing:  Open (no dressing)   Procedure completion:  Tolerated   (including critical care time)   Medications Ordered in ED Medications - No data to display  ED Course  I have reviewed the triage vital signs and the nursing notes.  Pertinent labs & imaging results that were available during my care of the patient were reviewed by me and considered in my medical decision making (see chart for details).    MDM Rules/Calculators/A&P  MDM  Screen complete  KIIRA BRACH was evaluated in Emergency Department on 05/26/2020 for the symptoms described in the history of present illness. She was evaluated in the context of the global COVID-19 pandemic, which necessitated consideration that the patient might be at risk for infection with the SARS-CoV-2 virus that causes COVID-19. Institutional protocols and algorithms that pertain to the evaluation of patients at risk for COVID-19 are in a state of rapid change based on information released by regulatory bodies including the CDC and federal and state organizations. These policies and algorithms were followed during the patient's care in the ED.  Patient presented for evaluation of symptoms of fatigue and malaise with near syncopal event today.  Patient with symptoms for the last 4 to 5 days.  She did suffer a laceration to her scalp with her fall today.  Laceration was repaired without difficulty.  Screening labs are without significant abnormality.  Patient is COVID screen positive. Patient does not meet criteria for admission at this time.   Ambulatory referral for Covid treatment made.   Patient agrees with plan of care  and understands plan for further outpatient treatment.       Final Clinical Impression(s) / ED Diagnoses Final diagnoses:  Fall, initial encounter  Laceration of scalp, initial encounter  COVID    Rx / DC Orders ED Discharge Orders    None       Valarie Merino, MD 05/26/20 2047

## 2020-05-26 NOTE — ED Triage Notes (Addendum)
Pt very HOH, states she fainted and fell today hitting head. Has laceration to head, bruising to left upper arm, denies blood thinners, states she has low plt count. Pt c/o pain in neck since fall. C collar place in triage.

## 2020-05-26 NOTE — Discharge Instructions (Signed)
Please return for any problem.   Staples to be removed in 7 days.   A referral has been made for further outpatient treatment of your COVID with the [facility].

## 2020-06-05 DIAGNOSIS — S0101XD Laceration without foreign body of scalp, subsequent encounter: Secondary | ICD-10-CM | POA: Diagnosis not present

## 2020-06-05 DIAGNOSIS — Z4802 Encounter for removal of sutures: Secondary | ICD-10-CM | POA: Diagnosis not present

## 2020-06-06 ENCOUNTER — Telehealth: Payer: Self-pay | Admitting: Internal Medicine

## 2020-06-06 NOTE — Telephone Encounter (Signed)
Noted  

## 2020-06-07 ENCOUNTER — Other Ambulatory Visit: Payer: Self-pay

## 2020-06-07 ENCOUNTER — Telehealth: Payer: Self-pay | Admitting: Internal Medicine

## 2020-06-07 MED ORDER — PANTOPRAZOLE SODIUM 40 MG PO TBEC: 40.0000 mg | DELAYED_RELEASE_TABLET | Freq: Two times a day (BID) | ORAL | 3 refills | Status: DC

## 2020-06-07 MED ORDER — FUROSEMIDE 40 MG PO TABS: 40.0000 mg | ORAL_TABLET | Freq: Every day | ORAL | 3 refills | Status: DC

## 2020-06-07 NOTE — Telephone Encounter (Signed)
Noted! Thank you

## 2020-06-07 NOTE — Telephone Encounter (Signed)
Pt wants to speak with you about her medications. She stated that there were changes that she needs to clarify.

## 2020-06-07 NOTE — Telephone Encounter (Signed)
Pt needed new scripts sent to [facility] for her for her protonix and her lasix script, these have been sent in for her. Pt wanted to let Dr. Henrene Pastor know that her wt today is 168lb and she is getting over Covid so she has not come in for her labs yet. She plans to come next week when she is out of her quarantine.

## 2020-06-16 ENCOUNTER — Other Ambulatory Visit (INDEPENDENT_AMBULATORY_CARE_PROVIDER_SITE_OTHER): Payer: Medicare Other

## 2020-06-16 DIAGNOSIS — K7460 Unspecified cirrhosis of liver: Secondary | ICD-10-CM

## 2020-06-16 DIAGNOSIS — R188 Other ascites: Secondary | ICD-10-CM | POA: Diagnosis not present

## 2020-06-16 DIAGNOSIS — K7581 Nonalcoholic steatohepatitis (NASH): Secondary | ICD-10-CM | POA: Diagnosis not present

## 2020-06-16 DIAGNOSIS — K429 Umbilical hernia without obstruction or gangrene: Secondary | ICD-10-CM | POA: Diagnosis not present

## 2020-06-16 LAB — BASIC METABOLIC PANEL
BUN: 16 mg/dL (ref 6–23)
CO2: 29 mEq/L (ref 19–32)
Calcium: 9.6 mg/dL (ref 8.4–10.5)
Chloride: 101 mEq/L (ref 96–112)
Creatinine, Ser: 0.92 mg/dL (ref 0.40–1.20)
GFR: 58.91 mL/min — ABNORMAL LOW (ref >60.00)
Glucose, Bld: 93 mg/dL (ref 70–99)
Potassium: 4.3 mEq/L (ref 3.5–5.1)
Sodium: 135 mEq/L (ref 135–145)

## 2020-06-20 ENCOUNTER — Other Ambulatory Visit: Payer: Self-pay

## 2020-06-20 ENCOUNTER — Telehealth: Payer: Self-pay | Admitting: Internal Medicine

## 2020-06-20 DIAGNOSIS — K7460 Unspecified cirrhosis of liver: Secondary | ICD-10-CM

## 2020-06-20 DIAGNOSIS — R188 Other ascites: Secondary | ICD-10-CM

## 2020-06-20 MED ORDER — ALBUMIN HUMAN 25 % IV SOLN: 12.5000 g | Freq: Once | INTRAVENOUS | Status: DC

## 2020-06-20 NOTE — Telephone Encounter (Signed)
I advise an ultrasound-guided paracentesis.  Would take up to 8 L (if ascites present) with albumin replacement.  Send fluid for cell count with differential.  Thanks

## 2020-06-20 NOTE — Telephone Encounter (Signed)
1.  The basic metabolic panel from 4 days ago was fine 2.  Repeat basic metabolic panel 4 weeks 3.  The surgeon reached out to me and stated that she was not in favor of proceeding with umbilical hernia repair.  The patient should be aware, of course. 4.  The surgeon mentioned that she was going to set up a paracentesis.  Where is that stand?

## 2020-06-20 NOTE — Telephone Encounter (Signed)
Pt scheduled for IR para per orders below, at [facility] 06/22/20 at 10am, pt to arrive in the xray dept at 9:45am. Left message for pt to call back.

## 2020-06-20 NOTE — Telephone Encounter (Signed)
Pt calling for lab results, please advise. 

## 2020-06-20 NOTE — Telephone Encounter (Signed)
Spoke with pt and she is aware. Order in for lab. Asked pt about paracentesis and she stated that she thought the surgeon was going to check with Dr. Henrene Pastor about setting the para up to see if there is any fluid present, pt states it is hard for her to tell with the corset that she wears. Please advise.

## 2020-06-20 NOTE — Telephone Encounter (Signed)
Spoke with pt and she is aware of appt.

## 2020-06-20 NOTE — Telephone Encounter (Signed)
Pt is requesting a call back from a nurse regarding her lab results.

## 2020-06-22 ENCOUNTER — Other Ambulatory Visit: Payer: Self-pay

## 2020-06-22 ENCOUNTER — Ambulatory Visit (HOSPITAL_COMMUNITY)
Admission: RE | Admit: 2020-06-22 | Discharge: 2020-06-22 | Disposition: A | Discharge status: Home / Self Care | Payer: Medicare Other | Source: Ambulatory Visit | Attending: Internal Medicine | Admitting: Internal Medicine

## 2020-06-22 DIAGNOSIS — K7460 Unspecified cirrhosis of liver: Secondary | ICD-10-CM | POA: Insufficient documentation

## 2020-06-22 DIAGNOSIS — R188 Other ascites: Secondary | ICD-10-CM | POA: Diagnosis not present

## 2020-06-22 HISTORY — PX: IR PARACENTESIS: IMG2679

## 2020-06-22 LAB — BODY FLUID CELL COUNT WITH DIFFERENTIAL
Eos, Fluid: 0 %
Lymphs, Fluid: 71 %
Monocyte-Macrophage-Serous Fluid: 23 % — ABNORMAL LOW (ref 50–90)
Neutrophil Count, Fluid: 6 % (ref 0–25)
Total Nucleated Cell Count, Fluid: 225 cu mm (ref 0–1000)

## 2020-06-22 MED ORDER — LIDOCAINE HCL (PF) 1 % IJ SOLN
INTRAMUSCULAR | Status: DC | PRN
  Administered 2020-06-22: 20 mL

## 2020-06-22 MED ORDER — LIDOCAINE HCL 1 % IJ SOLN
INTRAMUSCULAR | Status: AC
  Filled 2020-06-22: qty 20

## 2020-06-22 NOTE — Procedures (Signed)
PROCEDURE SUMMARY:  Successful US guided paracentesis from left lateral abdomen.  Yielded 4.6 liters of cloudy yellow fluid.  No immediate complications.  Patient tolerated well.  EBL = trace  Specimen was sent for labs.  Travelle Mcclimans S Cliff Damiani PA-C 06/22/2020 2:51 PM

## 2020-06-23 LAB — PATHOLOGIST SMEAR REVIEW

## 2020-06-29 ENCOUNTER — Telehealth: Payer: Self-pay | Admitting: Internal Medicine

## 2020-06-30 MED ORDER — SPIRONOLACTONE 100 MG PO TABS: 200.0000 mg | ORAL_TABLET | Freq: Every day | ORAL | 3 refills | Status: DC

## 2020-06-30 NOTE — Telephone Encounter (Signed)
Sent 90 supply of Aldactone to pharmacy

## 2020-07-03 DIAGNOSIS — B029 Zoster without complications: Secondary | ICD-10-CM | POA: Diagnosis not present

## 2020-07-04 ENCOUNTER — Telehealth: Payer: Self-pay | Admitting: Internal Medicine

## 2020-07-04 ENCOUNTER — Other Ambulatory Visit: Payer: Self-pay

## 2020-07-04 MED ORDER — SPIRONOLACTONE 100 MG PO TABS: 200.0000 mg | ORAL_TABLET | Freq: Every day | ORAL | 3 refills | Status: DC

## 2020-07-04 NOTE — Telephone Encounter (Signed)
Pt wanted to let Dr. Henrene Pastor know that her wt this am is 162lbs. She also wanted to let him know she saw her PCP yesterday and she has shingles on her left shoulder/chest area. She has been given valtrex and some pain meds but she wanted Dr. Henrene Pastor to be aware.

## 2020-07-04 NOTE — Telephone Encounter (Signed)
Appreciate the update

## 2020-07-10 DIAGNOSIS — B029 Zoster without complications: Secondary | ICD-10-CM | POA: Diagnosis not present

## 2020-07-17 ENCOUNTER — Other Ambulatory Visit: Payer: Self-pay

## 2020-07-17 ENCOUNTER — Other Ambulatory Visit (INDEPENDENT_AMBULATORY_CARE_PROVIDER_SITE_OTHER): Payer: Medicare Other

## 2020-07-17 DIAGNOSIS — R188 Other ascites: Secondary | ICD-10-CM

## 2020-07-17 DIAGNOSIS — K7460 Unspecified cirrhosis of liver: Secondary | ICD-10-CM

## 2020-07-17 DIAGNOSIS — S0101XD Laceration without foreign body of scalp, subsequent encounter: Secondary | ICD-10-CM | POA: Diagnosis not present

## 2020-07-17 DIAGNOSIS — B029 Zoster without complications: Secondary | ICD-10-CM | POA: Diagnosis not present

## 2020-07-17 LAB — BASIC METABOLIC PANEL
BUN: 21 mg/dL (ref 6–23)
CO2: 26 mEq/L (ref 19–32)
Calcium: 9.3 mg/dL (ref 8.4–10.5)
Chloride: 98 mEq/L (ref 96–112)
Creatinine, Ser: 0.91 mg/dL (ref 0.40–1.20)
GFR: 59.65 mL/min — ABNORMAL LOW (ref >60.00)
Glucose, Bld: 80 mg/dL (ref 70–99)
Potassium: 4.6 mEq/L (ref 3.5–5.1)
Sodium: 130 mEq/L — ABNORMAL LOW (ref 135–145)

## 2020-07-25 ENCOUNTER — Other Ambulatory Visit: Payer: Self-pay

## 2020-07-25 ENCOUNTER — Other Ambulatory Visit (INDEPENDENT_AMBULATORY_CARE_PROVIDER_SITE_OTHER): Payer: Medicare Other

## 2020-07-25 DIAGNOSIS — K7460 Unspecified cirrhosis of liver: Secondary | ICD-10-CM | POA: Diagnosis not present

## 2020-07-25 DIAGNOSIS — R188 Other ascites: Secondary | ICD-10-CM

## 2020-07-25 LAB — BASIC METABOLIC PANEL
BUN: 19 mg/dL (ref 6–23)
CO2: 26 mEq/L (ref 19–32)
Calcium: 9.4 mg/dL (ref 8.4–10.5)
Chloride: 101 mEq/L (ref 96–112)
Creatinine, Ser: 0.86 mg/dL (ref 0.40–1.20)
GFR: 63.83 mL/min (ref >60.00)
Glucose, Bld: 87 mg/dL (ref 70–99)
Potassium: 4.3 mEq/L (ref 3.5–5.1)
Sodium: 135 mEq/L (ref 135–145)

## 2020-08-02 ENCOUNTER — Other Ambulatory Visit (INDEPENDENT_AMBULATORY_CARE_PROVIDER_SITE_OTHER): Payer: Medicare Other

## 2020-08-02 ENCOUNTER — Other Ambulatory Visit: Payer: Self-pay

## 2020-08-02 DIAGNOSIS — R188 Other ascites: Secondary | ICD-10-CM | POA: Diagnosis not present

## 2020-08-02 DIAGNOSIS — K7460 Unspecified cirrhosis of liver: Secondary | ICD-10-CM | POA: Diagnosis not present

## 2020-08-02 LAB — BASIC METABOLIC PANEL
BUN: 20 mg/dL (ref 6–23)
CO2: 26 mEq/L (ref 19–32)
Calcium: 9.7 mg/dL (ref 8.4–10.5)
Chloride: 99 mEq/L (ref 96–112)
Creatinine, Ser: 0.88 mg/dL (ref 0.40–1.20)
GFR: 62.08 mL/min (ref >60.00)
Glucose, Bld: 85 mg/dL (ref 70–99)
Potassium: 4.3 mEq/L (ref 3.5–5.1)
Sodium: 133 mEq/L — ABNORMAL LOW (ref 135–145)

## 2020-08-07 ENCOUNTER — Other Ambulatory Visit: Payer: Self-pay

## 2020-08-07 ENCOUNTER — Telehealth: Payer: Self-pay | Admitting: Internal Medicine

## 2020-08-07 DIAGNOSIS — R188 Other ascites: Secondary | ICD-10-CM

## 2020-08-07 DIAGNOSIS — K7460 Unspecified cirrhosis of liver: Secondary | ICD-10-CM

## 2020-08-07 NOTE — Telephone Encounter (Signed)
Make sure fluid sent for cell count with diff Routine OV in 2 months, as previously recommended at her [facility]

## 2020-08-07 NOTE — Telephone Encounter (Signed)
LVP up to 5L with IV albumin Check with Dr. Henrene Pastor on timing for her next office appt

## 2020-08-07 NOTE — Telephone Encounter (Signed)
Crystal Barnett pt with cirrhosis calling requesting to have a paracentesis. Reports she has noticed her abd is more distended and tight. Reports her wt is all over the place but she feels uncomfortable. Dr. Fuller Plan please advise as DOD.

## 2020-08-07 NOTE — Telephone Encounter (Signed)
Pt scheduled for ir para at [facility] 08/08/20@9am . Pt to arrive there at 8:45am. Pt aware of appt. Please see note below from Dr. Fuller Plan regarding timing of next OV.

## 2020-08-08 ENCOUNTER — Ambulatory Visit (HOSPITAL_COMMUNITY)
Admission: RE | Admit: 2020-08-08 | Discharge: 2020-08-08 | Disposition: A | Discharge status: Home / Self Care | Payer: Medicare Other | Source: Ambulatory Visit | Attending: Gastroenterology | Admitting: Gastroenterology

## 2020-08-08 ENCOUNTER — Other Ambulatory Visit: Payer: Self-pay

## 2020-08-08 DIAGNOSIS — K7460 Unspecified cirrhosis of liver: Secondary | ICD-10-CM | POA: Diagnosis not present

## 2020-08-08 DIAGNOSIS — R188 Other ascites: Secondary | ICD-10-CM | POA: Diagnosis not present

## 2020-08-08 HISTORY — PX: IR PARACENTESIS: IMG2679

## 2020-08-08 LAB — BODY FLUID CELL COUNT WITH DIFFERENTIAL
Eos, Fluid: 0 %
Lymphs, Fluid: 71 %
Monocyte-Macrophage-Serous Fluid: 20 % — ABNORMAL LOW (ref 50–90)
Neutrophil Count, Fluid: 9 % (ref 0–25)
Total Nucleated Cell Count, Fluid: 338 cu mm (ref 0–1000)

## 2020-08-08 MED ORDER — LIDOCAINE HCL 1 % IJ SOLN
INTRAMUSCULAR | Status: AC
  Filled 2020-08-08: qty 20

## 2020-08-08 MED ORDER — LIDOCAINE HCL 1 % IJ SOLN
INTRAMUSCULAR | Status: DC | PRN
  Administered 2020-08-08: 10 mL

## 2020-08-08 NOTE — Procedures (Signed)
PROCEDURE SUMMARY:  Successful image-guided paracentesis from the left lateral abdomen.  Yielded 3.5 liters of hazy gold fluid.  No immediate complications.  EBL = 0 mL. Patient tolerated well.   Specimen was sent for labs.  Please see imaging section of Epic for full dictation.   Claris Pong Keeyon Privitera PA-C 08/08/2020 9:36 AM

## 2020-08-08 NOTE — Telephone Encounter (Signed)
Order added on for cell ct and diff, spoke with IR and gave verbal order.

## 2020-08-09 LAB — PATHOLOGIST SMEAR REVIEW

## 2020-08-09 NOTE — Telephone Encounter (Signed)
Spoke with patient in regards to medication dosage change. Patient will increase Lasix to 60 mg daily and Aldactone 300 mg daily. Patient is aware that she will need to repeat lab work in 1 week. Patient verbalized understanding and had no concerns at the end of the call.  Lab order and reminder in epic.

## 2020-08-09 NOTE — Telephone Encounter (Signed)
The pt states she had paracentesis with 4 liters removed yesterday.  Today she woke up with edema of the feet and legs bilaterally.  She would like to know if she should make any changes in her diuretics.  Please advise

## 2020-08-09 NOTE — Telephone Encounter (Signed)
Spoke with patient in regards to recommendations. She is on Lasix 40 mg daily and Aldactone 200 mg daily. She states that she skipped 2 days of Lasix a couple of weeks ago because she was told to hold it but other than that she takes her Lasix and Aldactone as directed. Please advise, thanks

## 2020-08-09 NOTE — Telephone Encounter (Signed)
1.  Please have her increase Lasix from 40 mg daily to 60 mg daily 2.  Please have her increase Aldactone from 200 mg daily to 300 mg daily 3.  Please have her get a basic metabolic panel in 1 week Thank you

## 2020-08-09 NOTE — Telephone Encounter (Signed)
Pt is requesting a call back from a nurse to discuss the paracentesis she had yesterday

## 2020-08-09 NOTE — Telephone Encounter (Signed)
Lm on vm for patient to return call 

## 2020-08-09 NOTE — Telephone Encounter (Signed)
She should consider support stockings.  Also, please check with her and tell me exactly her diuretics and dosages currently.  Thanks

## 2020-08-14 DIAGNOSIS — B029 Zoster without complications: Secondary | ICD-10-CM | POA: Diagnosis not present

## 2020-08-17 ENCOUNTER — Other Ambulatory Visit (INDEPENDENT_AMBULATORY_CARE_PROVIDER_SITE_OTHER): Payer: Medicare Other

## 2020-08-17 DIAGNOSIS — R188 Other ascites: Secondary | ICD-10-CM

## 2020-08-17 DIAGNOSIS — K7460 Unspecified cirrhosis of liver: Secondary | ICD-10-CM

## 2020-08-17 LAB — BASIC METABOLIC PANEL
BUN: 20 mg/dL (ref 6–23)
CO2: 29 mEq/L (ref 19–32)
Calcium: 9.6 mg/dL (ref 8.4–10.5)
Chloride: 98 mEq/L (ref 96–112)
Creatinine, Ser: 1.11 mg/dL (ref 0.40–1.20)
GFR: 46.97 mL/min — ABNORMAL LOW (ref >60.00)
Glucose, Bld: 89 mg/dL (ref 70–99)
Potassium: 4.4 mEq/L (ref 3.5–5.1)
Sodium: 134 mEq/L — ABNORMAL LOW (ref 135–145)

## 2020-08-18 ENCOUNTER — Other Ambulatory Visit: Payer: Self-pay

## 2020-08-18 DIAGNOSIS — K7460 Unspecified cirrhosis of liver: Secondary | ICD-10-CM

## 2020-08-22 ENCOUNTER — Telehealth: Payer: Self-pay | Admitting: Internal Medicine

## 2020-08-22 NOTE — Telephone Encounter (Signed)
See result note.  

## 2020-08-22 NOTE — Telephone Encounter (Signed)
Patient returned call about lab results, please call patient one more time.

## 2020-09-05 ENCOUNTER — Other Ambulatory Visit: Payer: Self-pay

## 2020-09-05 ENCOUNTER — Other Ambulatory Visit (INDEPENDENT_AMBULATORY_CARE_PROVIDER_SITE_OTHER): Payer: Medicare Other

## 2020-09-05 DIAGNOSIS — R188 Other ascites: Secondary | ICD-10-CM | POA: Diagnosis not present

## 2020-09-05 DIAGNOSIS — K7460 Unspecified cirrhosis of liver: Secondary | ICD-10-CM | POA: Diagnosis not present

## 2020-09-05 LAB — BASIC METABOLIC PANEL
BUN: 14 mg/dL (ref 6–23)
CO2: 26 mEq/L (ref 19–32)
Calcium: 9.3 mg/dL (ref 8.4–10.5)
Chloride: 101 mEq/L (ref 96–112)
Creatinine, Ser: 0.87 mg/dL (ref 0.40–1.20)
GFR: 62.9 mL/min (ref >60.00)
Glucose, Bld: 84 mg/dL (ref 70–99)
Potassium: 4.5 mEq/L (ref 3.5–5.1)
Sodium: 133 mEq/L — ABNORMAL LOW (ref 135–145)

## 2020-09-06 ENCOUNTER — Telehealth: Payer: Self-pay | Admitting: Internal Medicine

## 2020-09-06 NOTE — Telephone Encounter (Signed)
Patient is returning your call.  

## 2020-09-06 NOTE — Telephone Encounter (Signed)
See result note.  

## 2020-09-11 ENCOUNTER — Other Ambulatory Visit: Payer: Self-pay | Admitting: Interventional Radiology

## 2020-09-11 DIAGNOSIS — C220 Liver cell carcinoma: Secondary | ICD-10-CM

## 2020-09-14 ENCOUNTER — Other Ambulatory Visit: Payer: Self-pay

## 2020-09-14 ENCOUNTER — Telehealth: Payer: Self-pay | Admitting: Internal Medicine

## 2020-09-14 DIAGNOSIS — K7460 Unspecified cirrhosis of liver: Secondary | ICD-10-CM

## 2020-09-14 NOTE — Telephone Encounter (Signed)
Inbound call from patient. States she wants to discuss having a paracentesis. Best contact number [phone]

## 2020-09-14 NOTE — Telephone Encounter (Signed)
1.  Arrange ultrasound-guided paracentesis 2.  Please remove up to 8 L 3.  Please provide IV albumin replacement per usual protocol 4.  Please send fluid for cell count with differential

## 2020-09-14 NOTE — Telephone Encounter (Signed)
Pt scheduled for IR Para at [facility] 09/18/20 at 2pm, pt to arrive there at 1:45pm. Pt aware of appt.

## 2020-09-14 NOTE — Telephone Encounter (Signed)
Pt reports she is getting more uncomfortable and feels like she needs another paracentesis. Please advise.

## 2020-09-18 ENCOUNTER — Other Ambulatory Visit: Payer: Self-pay

## 2020-09-18 ENCOUNTER — Ambulatory Visit (HOSPITAL_COMMUNITY)
Admission: RE | Admit: 2020-09-18 | Discharge: 2020-09-18 | Disposition: A | Discharge status: Home / Self Care | Payer: Medicare Other | Source: Ambulatory Visit | Attending: Internal Medicine | Admitting: Internal Medicine

## 2020-09-18 DIAGNOSIS — K7460 Unspecified cirrhosis of liver: Secondary | ICD-10-CM | POA: Diagnosis not present

## 2020-09-18 DIAGNOSIS — R188 Other ascites: Secondary | ICD-10-CM | POA: Diagnosis not present

## 2020-09-18 HISTORY — PX: IR PARACENTESIS: IMG2679

## 2020-09-18 LAB — BODY FLUID CELL COUNT WITH DIFFERENTIAL
Eos, Fluid: 0 %
Lymphs, Fluid: 69 %
Monocyte-Macrophage-Serous Fluid: 13 % — ABNORMAL LOW (ref 50–90)
Neutrophil Count, Fluid: 18 % (ref 0–25)
Total Nucleated Cell Count, Fluid: 171 cu mm (ref 0–1000)

## 2020-09-18 MED ORDER — LIDOCAINE HCL 1 % IJ SOLN
INTRAMUSCULAR | Status: AC
  Filled 2020-09-18: qty 20

## 2020-09-18 NOTE — Procedures (Signed)
PROCEDURE SUMMARY:  Successful image-guided paracentesis from the left lower abdomen.  Yielded 5.5 liters of cloudy yellow fluid.  No immediate complications.  EBL = trace. Patient tolerated well.   Specimen was sent for labs.  Please see imaging section of Epic for full dictation.   Armando Gang Preslie Depasquale PA-C 09/18/2020 3:02 PM

## 2020-09-19 LAB — PATHOLOGIST SMEAR REVIEW

## 2020-09-22 ENCOUNTER — Other Ambulatory Visit: Payer: Self-pay

## 2020-09-22 ENCOUNTER — Ambulatory Visit (HOSPITAL_COMMUNITY)
Admission: RE | Admit: 2020-09-22 | Discharge: 2020-09-22 | Disposition: A | Discharge status: Home / Self Care | Payer: Medicare Other | Source: Ambulatory Visit | Attending: Interventional Radiology | Admitting: Interventional Radiology

## 2020-09-22 DIAGNOSIS — I8500 Esophageal varices without bleeding: Secondary | ICD-10-CM | POA: Diagnosis not present

## 2020-09-22 DIAGNOSIS — C220 Liver cell carcinoma: Secondary | ICD-10-CM | POA: Diagnosis not present

## 2020-09-22 DIAGNOSIS — K7460 Unspecified cirrhosis of liver: Secondary | ICD-10-CM | POA: Diagnosis not present

## 2020-09-22 DIAGNOSIS — K766 Portal hypertension: Secondary | ICD-10-CM | POA: Diagnosis not present

## 2020-09-22 MED ORDER — GADOXETATE DISODIUM 0.25 MMOL/ML IV SOLN
8.0000 mL | Freq: Once | INTRAVENOUS | Status: AC | PRN
  Administered 2020-09-22: 8 mL via INTRAVENOUS

## 2020-09-26 ENCOUNTER — Other Ambulatory Visit: Payer: Self-pay

## 2020-09-26 ENCOUNTER — Ambulatory Visit
Admission: RE | Admit: 2020-09-26 | Discharge: 2020-09-26 | Disposition: A | Discharge status: Home / Self Care | Payer: Medicare Other | Source: Ambulatory Visit | Attending: Interventional Radiology | Admitting: Interventional Radiology

## 2020-09-26 ENCOUNTER — Encounter: Payer: Self-pay | Admitting: *Deleted

## 2020-09-26 DIAGNOSIS — C220 Liver cell carcinoma: Secondary | ICD-10-CM | POA: Diagnosis not present

## 2020-09-26 DIAGNOSIS — Z9889 Other specified postprocedural states: Secondary | ICD-10-CM | POA: Diagnosis not present

## 2020-09-26 HISTORY — PX: IR RADIOLOGIST EVAL & MGMT: IMG5224

## 2020-09-26 NOTE — Progress Notes (Signed)
Chief Complaint: Patient was consulted remotely today (TeleHealth) for No chief complaint on file.  at the request of Crystal Barnett.    Referring Physician(s): Crystal Barnett  History of Present Illness: Crystal Barnett is a 80 y.o. 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 MRI imaging was performed first in December 2016 to evaluate a region of possible concern in the left hepatic lobe. On that study, there was a small 9 mm enhancing focus in hepatic segment 7 in the posterosuperior aspect of the hepatic dome. This subsequently been followed at 6 month intervals with repeat MRI scans of the abdomen.  MRI from 12/5/19demonstrates more definitive enlargement of the lesion now measuring up to 1.9 cm. Additionally, there appears to be an enhancing pseudocapsule. On the present study, the imaging characteristics are consistent with aLi-RADS5 lesion which is diagnostic of hepatocellular carcinoma. The very slow growth rate over the past 3 years suggests a low-grade or indolent malignancy at this time.  Due to the presence of ascites and the location of the lesion high in the right dome just under the lung, we elected to proceed with transarterial radio embolization segmentectomy(8)which was performed on 11/17/2018.  Today, we are having our64-monthpost procedure follow-up consultation via telemedicine. Crystal Barnett doing well. She denies fatigue, abdominal pain, nausea, vomiting or other clinical symptoms.  MRI 03/11/19:Treated segment 7 right liver mass is decreased in size and demonstrates no internal solid enhancement to suggest residual or recurrent viability. Geographic signal intensity changes at the liver dome surrounding the treated lesion, favor post treatment change. No new liver masses. Continued MRI surveillance recommended.  MRI 07/05/19:Stable appearance of treated segment 7 right liver lesion which is  decreased in size without internal enhancement. No new liver Lesions.  MRI 10/01/19:Unchanged ablation site of the liver dome, hepatic segment VII, without residual contrast enhancement. No evidence of viability or recurrence. LI-RADS 5T. No new suspicious liver lesions.  MRI 12/31/19: Similar size of ablation defect within the high right hepatic lobe, without findings of recurrent or metastatic disease. Cirrhosis and portal venous hypertension, without new hepatocellular carcinoma.  MRI 03/20/20: At the site of the prior segment 7 hepatocellular carcinoma that was shown on 04/16/2018, there is a small nonenhancing 1.0 by 0.6 cm ablation site with no significant recurrent arterial phase enhancement to suggest active malignancy.  MRI 09/24/20: Stable small nonenhancing lesion at the hepatic dome in segment 7 consistent with treated disease. No findings suspicious for recurrent tumor and no new early arterial phase enhancing hepatic lesions to suggest hepatoma or dysplastic nodule.  [name] in her usual state of health today.  She is doing quite well and has no complaints or active issues.  Past Medical History:  Diagnosis Date  . ALLERGIC RHINITIS 01/12/2008  . Allergy   . Cancer ([facility]    liver cancer hepatocellular carcinoma per pt   . Cataract    bilateral  . Colon polyps    hyperplastic  . Constipation    ongoing issues per pt  . Diabetes ([facility]   . Esophageal varices ([facility]   . Fatty liver   . GOITER, MULTINODULAR 01/12/2008  . Hepatic cirrhosis ([facility]   . History of radiation therapy 11/20/2018   x 1 radiation for liver cancer  . Hypertension    under control  . HYPOTHYROIDISM 09/07/2008  . 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 Liver cirrhosis secondary  to NASH ([place])   . Osteoporosis   . 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 Splenomegaly   . Thrombocytopenia ([place])    Appears chronic from at least 2014  . Vitamin D deficiency      Past Surgical History:  Procedure Laterality Date  . arthroscopic left knee  2005  . BREAST BIOPSY Left 2017  . BREAST EXCISIONAL BIOPSY Right 2008  . BREAST SURGERY  2008   Breast Biopsy   . CHOLECYSTECTOMY  1985  . COLONOSCOPY    . IR ANGIOGRAM SELECTIVE EACH ADDITIONAL VESSEL  11/03/2018  . IR ANGIOGRAM SELECTIVE EACH ADDITIONAL VESSEL  11/03/2018  . IR ANGIOGRAM SELECTIVE EACH ADDITIONAL VESSEL  11/03/2018  . IR ANGIOGRAM SELECTIVE EACH ADDITIONAL VESSEL  11/03/2018  . IR ANGIOGRAM SELECTIVE EACH ADDITIONAL VESSEL  11/20/2018  . IR ANGIOGRAM SELECTIVE EACH ADDITIONAL VESSEL  11/20/2018  . IR ANGIOGRAM SELECTIVE EACH ADDITIONAL VESSEL  11/20/2018  . IR ANGIOGRAM VISCERAL SELECTIVE  11/03/2018  . IR ANGIOGRAM VISCERAL SELECTIVE  11/03/2018  . IR ANGIOGRAM VISCERAL SELECTIVE  11/03/2018  . IR ANGIOGRAM VISCERAL SELECTIVE  11/20/2018  . IR ANGIOGRAM VISCERAL SELECTIVE  11/20/2018  . IR EMBO TUMOR ORGAN ISCHEMIA INFARCT INC GUIDE ROADMAPPING  11/20/2018  . IR GENERIC HISTORICAL  04/02/2016   IR RADIOLOGIST EVAL & MGMT 04/02/2016 Jacqulynn Cadet, MD [facility] INTERV RAD  . IR PARACENTESIS  02/02/2018  . IR PARACENTESIS  04/20/2018  . IR PARACENTESIS  12/03/2018  . IR PARACENTESIS  07/15/2019  . IR PARACENTESIS  02/25/2020  . IR PARACENTESIS  06/22/2020  . IR PARACENTESIS  08/08/2020  . IR PARACENTESIS  09/18/2020  . IR RADIOLOGIST EVAL & MGMT  10/29/2016  . IR RADIOLOGIST EVAL & MGMT  04/29/2017  . IR RADIOLOGIST EVAL & MGMT  11/04/2017  . IR RADIOLOGIST EVAL & MGMT  04/16/2018  . IR RADIOLOGIST EVAL & MGMT  12/08/2018  . IR RADIOLOGIST EVAL & MGMT  03/16/2019  . IR RADIOLOGIST EVAL & MGMT  07/08/2019  . IR RADIOLOGIST EVAL & MGMT  10/07/2019  . IR RADIOLOGIST EVAL & MGMT  01/05/2020  . IR RADIOLOGIST EVAL & MGMT  03/23/2020  . IR US GUIDE VASC ACCESS LEFT  11/20/2018  . IR US GUIDE VASC ACCESS RIGHT  11/03/2018  . POLYPECTOMY    . UPPER GASTROINTESTINAL ENDOSCOPY      Allergies: Patient has no  known allergies.  Medications: Prior to Admission medications   Medication Sig Start Date End Date Taking? Authorizing Provider  alendronate (FOSAMAX) 70 MG tablet Take 70 mg by mouth once a week. On Saturday 03/24/17   [provider]  Blood Glucose Monitoring Suppl ([place]) w/Device KIT OneTouch Verio System  USE TO TEST ONCE D UTD    [provider]  Calcium Carbonate-Vit D-Min (CALCIUM 1200 PO) Take 1,200 mg by mouth every other day.    [provider]  Cholecalciferol (VITAMIN D) 50 MCG (2000 UT) CAPS Take 2,000 Units by mouth at bedtime.    [provider]  fluticasone (FLONASE) 50 MCG/ACT nasal spray Place 1 spray into both nostrils daily.     [provider]  furosemide (LASIX) 40 MG tablet Take 1 tablet (40 mg total) by mouth daily. 05/01/20   Irene Shipper, MD  furosemide (LASIX) 40 MG tablet Take 1 tablet (40 mg total) by mouth daily. 06/07/20   Irene Shipper, MD  Lactobacillus (PROBIOTIC ACIDOPHILUS) CAPS Take 1 capsule by mouth 2 (two)  times a week.     [provider]  levothyroxine (SYNTHROID) 50 MCG tablet Take 1 tablet (50 mcg total) by mouth daily. 03/22/20   Renato Shin, MD  metFORMIN (GLUCOPHAGE) 500 MG tablet Take 500 mg by mouth every evening. Take 1 tablet by mouth once daily    [provider]  montelukast (SINGULAIR) 10 MG tablet Take 10 mg by mouth at bedtime.  03/25/14   [provider]  Multiple Vitamin (MULTIVITAMIN) tablet Take 1 tablet by mouth at bedtime.     [provider]  pantoprazole (PROTONIX) 40 MG tablet Take 1 tablet (40 mg total) by mouth 2 (two) times daily. 05/01/20   Irene Shipper, MD  pantoprazole (PROTONIX) 40 MG tablet Take 1 tablet (40 mg total) by mouth 2 (two) times daily before a meal. 06/07/20   Irene Shipper, MD  polyethylene glycol (MIRALAX / GLYCOLAX) 17 g packet Take 17 g by mouth daily.    [provider]  spironolactone  (ALDACTONE) 100 MG tablet Take 2 tablets (200 mg total) by mouth daily. 07/04/20   Irene Shipper, MD  vitamin C (ASCORBIC ACID) 500 MG tablet Take 500 mg by mouth every other day.    [provider]     Family History  Problem Relation Age of Onset  . Goiter Maternal Grandmother   . Stroke Father 66  . Breast cancer Mother        in 12's  . Colon cancer Neg Hx   . Colon polyps Neg Hx   . Esophageal cancer Neg Hx   . Stomach cancer Neg Hx   . Rectal cancer Neg Hx     Social History   Socioeconomic History  . Marital status: Single    Spouse name: Not on file  . Number of children: 1  . Years of education: Not on file  . Highest education level: Not on file  Occupational History  . Occupation: retired    Comment: works in Chiropodist for [employer]  Tobacco Use  . Smoking status: Former Smoker    Quit date: 05/13/1990    Years since quitting: 30.3  . Smokeless tobacco: Never Used  Vaping Use  . Vaping Use: Never used  Substance and Sexual Activity  . Alcohol use: No    Alcohol/week: 0.0 standard drinks  . Drug use: No  . Sexual activity: Not on file  Other Topics Concern  . Not on file  Social History Narrative  . Not on file   Social Determinants of Health   Financial Resource Strain: Not on file  Food Insecurity: Not on file  Transportation Needs: Not on file  Physical Activity: Not on file  Stress: Not on file  Social Connections: Not on file    Review of Systems  Review of Systems: A 12 point ROS discussed and pertinent positives are indicated in the HPI above.  All other systems are negative.  Physical Exam No direct physical exam was performed (except for noted visual exam findings with Video Visits).   Vital Signs: There were no vitals taken for this visit.  Imaging: MR ABDOMEN WWO CONTRAST  Result Date: 09/24/2020 CLINICAL DATA:  Follow-up hepatocellular carcinoma. Patient is status post Y 90 embolization procedure. EXAM:  MRI ABDOMEN WITHOUT AND WITH CONTRAST TECHNIQUE: Multiplanar multisequence MR imaging of the abdomen was performed both before and after the administration of intravenous contrast. CONTRAST:  77m EOVIST GADOXETATE DISODIUM 0.25 MOL/L IV SOLN COMPARISON:  Prior MRI examination 03/20/2020 FINDINGS: Lower chest: The lung bases are grossly clear. No pleural effusions or pulmonary lesions. No pericardial effusion. Hepatobiliary: Stable cirrhotic changes involving the liver with portal venous hypertension, portal venous collaterals and paraesophageal varices. There is a small nonenhancing lesion at the hepatic dome in segment 7 measuring approximately 8 mm. This appears stable and is consistent with treated disease. No findings suspicious for recurrent tumor and no new early arterial phase enhancing hepatic lesions to suggest hepatoma or dysplastic nodule. The gallbladder is surgically absent. No common bile duct dilatation. Pancreas:  No mass, inflammation or ductal dilatation. Spleen:  Normal size. No focal lesions. Adrenals/Urinary Tract:  The adrenal glands and kidneys are. Stomach/Bowel: The stomach, duodenum, visualized small bowel and visualized colon unremarkable. Vascular/Lymphatic: The aorta and branch vessels are patent. The major venous structures are patent. No mesenteric or retroperitoneal mass or adenopathy. Small epicardial lymph node is noted on the left side and is unchanged. This is typical with cirrhosis. Other:  Moderate abdominal ascites. Musculoskeletal: No significant bony findings. Stable small lesion in the T12 vertebral body. This shows loss of signal intensity on the out of phase T1 weighted gradient echo images and is consistent with a benign hemangioma. IMPRESSION: 1. Stable cirrhotic changes involving the liver with portal venous hypertension, portal venous collaterals and paraesophageal varices. 2. Stable small nonenhancing lesion at the hepatic dome in segment 7 consistent with treated  disease. No findings suspicious for recurrent tumor and no new early arterial phase enhancing hepatic lesions to suggest hepatoma or dysplastic nodule. 3. Moderate abdominal ascites. Electronically Signed   By: Marijo Sanes M.D.   On: 09/24/2020 15:59   IR Paracentesis  Result Date: 09/19/2020 INDICATION: Recurrent ascites in patient with history of NASH cirrhosis, and right liver mass. Request for therapeutic and diagnostic paracentesis. EXAM: ULTRASOUND GUIDED  PARACENTESIS MEDICATIONS: 15 mL 1% lidocaine COMPLICATIONS: None immediate. PROCEDURE: Informed written consent was obtained from the patient after a discussion of the risks, benefits and alternatives to treatment. A timeout was performed prior to the initiation of the procedure. Initial ultrasound scanning demonstrates a large amount of ascites within the left lower abdominal quadrant. The left lower abdomen was prepped and draped in the usual sterile fashion. 1% lidocaine was used for local anesthesia. Following this, a 19 gauge, 10-cm, Yueh catheter was introduced. An ultrasound image was saved for documentation purposes. The paracentesis was performed. The catheter was removed and a dressing was applied. The patient tolerated the procedure well without immediate post procedural complication. FINDINGS: A total of approximately 5.5 L of yellow fluid was removed. Samples were sent to the laboratory as requested by the clinical team. IMPRESSION: Successful ultrasound-guided paracentesis yielding 5.5 liters of peritoneal fluid. Read by: Durenda Guthrie, PA-C Electronically Signed   By: Miachel Roux M.D.   On: 09/18/2020 16:35    Labs:  CBC: Recent Labs    09/29/19 1050 12/01/19 1319 03/17/20 1130 05/26/20 1815  WBC 3.7* 3.3* 4.0 3.2*  HGB 14.1 13.8 14.2 12.9  HCT 41.9 41.0 42.1 38.0  PLT 131* 129* 145* 118*    COAGS: Recent Labs    09/29/19 1050 03/17/20 1130  INR 1.1 1.1    BMP: Recent Labs    09/29/19 1050 10/04/19 0920  12/01/19 1319 12/08/19 0849 03/17/20 1130 04/05/20 1207 05/26/20 1815 06/16/20 1043 07/25/20 0926 08/02/20 0927 08/17/20 0927 09/05/20 0926  NA 136   < > 139   < > 132*   < > 133*   < >  135 133* 134* 133*  Barnett 4.6   < > 4.4   < > 4.6   < > 4.5   < > 4.3 4.3 4.4 4.5  CL 100   < > 102   < > 95*   < > 99   < > 101 99 98 101  CO2 23   < > 26   < > 24   < > 21*   < > 26 26 29 26   GLUCOSE 98   < > 84   < > 91   < > 111*   < > 87 85 89 84  BUN 27*   < > 21   < > 24*   < > 16   < > 19 20 20 14   CALCIUM 9.7   < > 10.5*   < > 9.4   < > 9.6   < > 9.4 9.7 9.6 9.3  CREATININE 0.98   < > 1.10*   < > 0.93   < > 0.89   < > 0.86 0.88 1.11 0.87  GFRNONAA 55*  --  48*  --  >60  --  >60  --   --   --   --   --   GFRAA >60  --  55*  --   --   --   --   --   --   --   --   --    < > = values in this interval not displayed.    LIVER FUNCTION TESTS: Recent Labs    09/29/19 1050 12/01/19 1319 03/17/20 1130  BILITOT 1.2 1.0 1.4*  AST 37 26 33  ALT 28 21 22   ALKPHOS 78 89 82  PROT 7.4 7.4 7.9  ALBUMIN 4.3 3.9 4.2    TUMOR MARKERS: No results for input(s): AFPTM, CEA, CA199, CHROMGRNA in the last 8760 hours.  Assessment and Plan:  80 year old female doing well status post radiation segmentectomy of hepatocellular carcinoma in segment 7.  Most recent MR imaging demonstrates no evidence of recurrent or new disease.  She has had some issues with portal hypertension and symptomatic ascites which is complicated by an umbilical hernia.  She has seen 2 surgeons and has been told that her hernia is inoperable and should it rupture she may be left with a permanent nonhealing wound and chronic drainage.  She attributes her recent requirements for paracentesis to eating out more than normal as she recovered from several other health issues.  She is working on returning to a lower fluid and sodium diet and keeping her food journal.  She still follows with Dr. Henrene Pastor who prescribes her diuretics and paracenteses as  needed as well as keeps track of her serum sodium levels.  I explained to her that if she is unable to control her ascites through diet, fluid restriction and diuretics, there is a procedure known as a TIPS which may be helpful for her.   I described the procedure and its potential benefits.  1.)  Next follow-up MRI with gadolinium and accompanying clinic visit as well as liver labs in 6 months.  2.)  Continue to adhere to food journal, decrease sodium intake and fluid intake as directed.  Follow-up with Dr. Blanch Media office and adjust diuretics as needed for ascites control.  If ascites control becomes refractory to conservative measures, we can discuss the possibility of a TIPS creation further.    Electronically Signed: [place] 09/26/2020, 12:14 PM  I spent a total of  15 Minutes in remote  clinical consultation, greater than 50% of which was counseling/coordinating care for hepatocellular cancer.    Visit type: Audio only (telephone). Audio (no video) only due to patient preference. Alternative for in-person consultation at [facility], [address], [place], [place]. This visit type was conducted due to national recommendations for restrictions regarding the COVID-19 Pandemic (e.g. social distancing).  This format is felt to be most appropriate for this patient at this time.  All issues noted in this document were discussed and addressed.

## 2020-10-06 ENCOUNTER — Other Ambulatory Visit (INDEPENDENT_AMBULATORY_CARE_PROVIDER_SITE_OTHER): Payer: Medicare Other

## 2020-10-06 DIAGNOSIS — K7460 Unspecified cirrhosis of liver: Secondary | ICD-10-CM | POA: Diagnosis not present

## 2020-10-06 DIAGNOSIS — R188 Other ascites: Secondary | ICD-10-CM | POA: Diagnosis not present

## 2020-10-06 LAB — BASIC METABOLIC PANEL
BUN: 19 mg/dL (ref 6–23)
CO2: 27 mEq/L (ref 19–32)
Calcium: 9.4 mg/dL (ref 8.4–10.5)
Chloride: 99 mEq/L (ref 96–112)
Creatinine, Ser: 0.88 mg/dL (ref 0.40–1.20)
GFR: 62 mL/min (ref >60.00)
Glucose, Bld: 84 mg/dL (ref 70–99)
Potassium: 4.3 mEq/L (ref 3.5–5.1)
Sodium: 132 mEq/L — ABNORMAL LOW (ref 135–145)

## 2020-10-10 ENCOUNTER — Other Ambulatory Visit: Payer: Self-pay

## 2020-10-10 DIAGNOSIS — R188 Other ascites: Secondary | ICD-10-CM

## 2020-10-10 DIAGNOSIS — K7460 Unspecified cirrhosis of liver: Secondary | ICD-10-CM

## 2020-10-20 DIAGNOSIS — L03116 Cellulitis of left lower limb: Secondary | ICD-10-CM | POA: Diagnosis not present

## 2020-10-23 DIAGNOSIS — Z0000 Encounter for general adult medical examination without abnormal findings: Secondary | ICD-10-CM | POA: Diagnosis not present

## 2020-10-23 DIAGNOSIS — Z1389 Encounter for screening for other disorder: Secondary | ICD-10-CM | POA: Diagnosis not present

## 2020-10-24 DIAGNOSIS — E559 Vitamin D deficiency, unspecified: Secondary | ICD-10-CM | POA: Diagnosis not present

## 2020-10-24 DIAGNOSIS — I10 Essential (primary) hypertension: Secondary | ICD-10-CM | POA: Diagnosis not present

## 2020-10-24 DIAGNOSIS — E1169 Type 2 diabetes mellitus with other specified complication: Secondary | ICD-10-CM | POA: Diagnosis not present

## 2020-10-24 DIAGNOSIS — D696 Thrombocytopenia, unspecified: Secondary | ICD-10-CM | POA: Diagnosis not present

## 2020-10-24 DIAGNOSIS — L03116 Cellulitis of left lower limb: Secondary | ICD-10-CM | POA: Diagnosis not present

## 2020-10-24 DIAGNOSIS — M79672 Pain in left foot: Secondary | ICD-10-CM | POA: Diagnosis not present

## 2020-10-24 DIAGNOSIS — E039 Hypothyroidism, unspecified: Secondary | ICD-10-CM | POA: Diagnosis not present

## 2020-10-24 DIAGNOSIS — K7581 Nonalcoholic steatohepatitis (NASH): Secondary | ICD-10-CM | POA: Diagnosis not present

## 2020-10-30 DIAGNOSIS — M810 Age-related osteoporosis without current pathological fracture: Secondary | ICD-10-CM | POA: Diagnosis not present

## 2020-10-30 DIAGNOSIS — E039 Hypothyroidism, unspecified: Secondary | ICD-10-CM | POA: Diagnosis not present

## 2020-10-30 DIAGNOSIS — E1169 Type 2 diabetes mellitus with other specified complication: Secondary | ICD-10-CM | POA: Diagnosis not present

## 2020-10-30 DIAGNOSIS — K429 Umbilical hernia without obstruction or gangrene: Secondary | ICD-10-CM | POA: Diagnosis not present

## 2020-10-30 DIAGNOSIS — C220 Liver cell carcinoma: Secondary | ICD-10-CM | POA: Diagnosis not present

## 2020-10-30 DIAGNOSIS — L03116 Cellulitis of left lower limb: Secondary | ICD-10-CM | POA: Diagnosis not present

## 2020-10-30 DIAGNOSIS — E559 Vitamin D deficiency, unspecified: Secondary | ICD-10-CM | POA: Diagnosis not present

## 2020-10-30 DIAGNOSIS — D696 Thrombocytopenia, unspecified: Secondary | ICD-10-CM | POA: Diagnosis not present

## 2020-10-30 DIAGNOSIS — I10 Essential (primary) hypertension: Secondary | ICD-10-CM | POA: Diagnosis not present

## 2020-10-30 DIAGNOSIS — K7581 Nonalcoholic steatohepatitis (NASH): Secondary | ICD-10-CM | POA: Diagnosis not present

## 2020-11-07 ENCOUNTER — Other Ambulatory Visit (INDEPENDENT_AMBULATORY_CARE_PROVIDER_SITE_OTHER): Payer: Medicare Other

## 2020-11-07 ENCOUNTER — Other Ambulatory Visit: Payer: Self-pay | Admitting: *Deleted

## 2020-11-07 DIAGNOSIS — R188 Other ascites: Secondary | ICD-10-CM

## 2020-11-07 DIAGNOSIS — K7460 Unspecified cirrhosis of liver: Secondary | ICD-10-CM

## 2020-11-07 LAB — BASIC METABOLIC PANEL
BUN: 18 mg/dL (ref 6–23)
CO2: 23 mEq/L (ref 19–32)
Calcium: 9.3 mg/dL (ref 8.4–10.5)
Chloride: 100 mEq/L (ref 96–112)
Creatinine, Ser: 0.95 mg/dL (ref 0.40–1.20)
GFR: 56.53 mL/min — ABNORMAL LOW (ref >60.00)
Glucose, Bld: 125 mg/dL — ABNORMAL HIGH (ref 70–99)
Potassium: 4.4 mEq/L (ref 3.5–5.1)
Sodium: 132 mEq/L — ABNORMAL LOW (ref 135–145)

## 2020-11-29 NOTE — Progress Notes (Signed)
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 Laurence Ferrari) s/p Y90 rx with good response  Current Treatment: observation and further evaluation   INTERVAL HISTORY:   Crystal Barnett is here for follow-up regarding her thrombocytopenia and regarding her concerning liver lesion - likely low grade HCC. The patient's last visit with [place] was on 12/01/2019. The pt reports that she is doing well overall.  The pt reports she is having issues with recurrent symptomatic ascites and has needed recurrent therapeutic paracentesis.  Lab results today 11/30/2020 of CBC w/diff and CMP is as follows: PLT stable at 136k, hgb .12.7, AFP tumor marker 3.7 MRI abd 09/22/2020: Stable cirrhotic changes involving the liver with portal venous hypertension, portal venous collaterals and paraesophageal varices. 2. Stable small nonenhancing lesion at the hepatic dome in segment 7 consistent with treated disease. No findings suspicious for recurrent tumor and no new early arterial phase enhancing hepatic lesions to suggest hepatoma or dysplastic nodule. 3. Moderate abdominal ascites.  On review of systems, pt reports no issues with overt GI bleeding or other bleeding.    REVIEW OF SYSTEMS:   10 Point review of Systems was done is negative except as noted above. . Past Medical History:  Diagnosis Date   ALLERGIC RHINITIS 01/12/2008   Allergy    Cancer ([place])    liver cancer hepatocellular carcinoma per pt    Cataract    bilateral   Colon polyps    hyperplastic   Constipation    ongoing issues per pt   Diabetes ([place])    Esophageal varices  ([place])    Fatty liver    GOITER, MULTINODULAR 01/12/2008   Hepatic cirrhosis ([place])    History of radiation therapy 11/20/2018   x 1 radiation for liver cancer   Hypertension    under control   HYPOTHYROIDISM 11/06/348   Lichen planus    In the mouth, Notes that she's had this for at least 20 years   Liver cirrhosis secondary to NASH ([place])    Osteoporosis    Portal venous hypertension ([place])    Splenomegaly    Thrombocytopenia ([place])    Appears chronic from at least 2014   Vitamin D deficiency     . Past Surgical History:  Procedure Laterality Date   arthroscopic left knee  2005   BREAST BIOPSY Left 2017   BREAST EXCISIONAL BIOPSY Right 2008   BREAST SURGERY  2008   Breast Biopsy    CHOLECYSTECTOMY  1985   COLONOSCOPY     IR ANGIOGRAM SELECTIVE EACH ADDITIONAL VESSEL  11/03/2018   IR ANGIOGRAM SELECTIVE EACH ADDITIONAL VESSEL  11/03/2018   IR ANGIOGRAM SELECTIVE EACH ADDITIONAL VESSEL  11/03/2018   IR ANGIOGRAM SELECTIVE EACH ADDITIONAL VESSEL  11/03/2018   IR ANGIOGRAM SELECTIVE EACH ADDITIONAL VESSEL  11/20/2018   IR ANGIOGRAM SELECTIVE EACH ADDITIONAL VESSEL  11/20/2018   IR ANGIOGRAM SELECTIVE EACH ADDITIONAL VESSEL  11/20/2018   IR ANGIOGRAM VISCERAL SELECTIVE  11/03/2018   IR ANGIOGRAM VISCERAL SELECTIVE  11/03/2018   IR ANGIOGRAM VISCERAL SELECTIVE  11/03/2018   IR ANGIOGRAM VISCERAL SELECTIVE  11/20/2018   IR ANGIOGRAM VISCERAL  SELECTIVE  11/20/2018   IR EMBO TUMOR ORGAN ISCHEMIA INFARCT INC GUIDE ROADMAPPING  11/20/2018   IR GENERIC HISTORICAL  04/02/2016   IR RADIOLOGIST EVAL & MGMT 04/02/2016 Jacqulynn Cadet, MD [facility] INTERV RAD   IR PARACENTESIS  02/02/2018   IR PARACENTESIS  04/20/2018   IR PARACENTESIS  12/03/2018   IR PARACENTESIS  07/15/2019   IR PARACENTESIS  02/25/2020   IR PARACENTESIS  06/22/2020   IR PARACENTESIS  08/08/2020   IR PARACENTESIS  09/18/2020   IR RADIOLOGIST EVAL & MGMT  10/29/2016   IR RADIOLOGIST EVAL & MGMT  04/29/2017   IR RADIOLOGIST EVAL & MGMT   11/04/2017   IR RADIOLOGIST EVAL & MGMT  04/16/2018   IR RADIOLOGIST EVAL & MGMT  12/08/2018   IR RADIOLOGIST EVAL & MGMT  03/16/2019   IR RADIOLOGIST EVAL & MGMT  07/08/2019   IR RADIOLOGIST EVAL & MGMT  10/07/2019   IR RADIOLOGIST EVAL & MGMT  01/05/2020   IR RADIOLOGIST EVAL & MGMT  03/23/2020   IR RADIOLOGIST EVAL & MGMT  09/26/2020   IR US GUIDE VASC ACCESS LEFT  11/20/2018   IR US GUIDE VASC ACCESS RIGHT  11/03/2018   POLYPECTOMY     UPPER GASTROINTESTINAL ENDOSCOPY      Social History   Tobacco Use   Smoking status: Former    Types: Cigarettes    Quit date: 05/13/1990    Years since quitting: 30.5   Smokeless tobacco: Never  Vaping Use   Vaping Use: Never used  Substance Use Topics   Alcohol use: No    Alcohol/week: 0.0 standard drinks   Drug use: No    ALLERGIES:  has No Known Allergies.   MEDICATIONS:  Current Outpatient Medications  Medication Sig Dispense Refill   alendronate (FOSAMAX) 70 MG tablet Take 70 mg by mouth once a week. On Saturday     Blood Glucose Monitoring Suppl (ONETOUCH VERIO FLEX SYSTEM) w/Device KIT OneTouch Verio System  USE TO TEST ONCE D UTD     Calcium Carbonate-Vit D-Min (CALCIUM 1200 PO) Take 1,200 mg by mouth every other day.     Cholecalciferol (VITAMIN D) 50 MCG (2000 UT) CAPS Take 2,000 Units by mouth at bedtime.     fluticasone (FLONASE) 50 MCG/ACT nasal spray Place 1 spray into both nostrils daily.      furosemide (LASIX) 40 MG tablet Take 1 tablet (40 mg total) by mouth daily. 30 tablet 11   furosemide (LASIX) 40 MG tablet Take 1 tablet (40 mg total) by mouth daily. 90 tablet 3   Lactobacillus (PROBIOTIC ACIDOPHILUS) CAPS Take 1 capsule by mouth 2 (two) times a week.      levothyroxine (SYNTHROID) 50 MCG tablet Take 1 tablet (50 mcg total) by mouth daily. 90 tablet 3   metFORMIN (GLUCOPHAGE) 500 MG tablet Take 500 mg by mouth every evening. Take 1 tablet by mouth once daily     montelukast (SINGULAIR) 10 MG tablet Take 10 mg by mouth at  bedtime.      Multiple Vitamin (MULTIVITAMIN) tablet Take 1 tablet by mouth at bedtime.      pantoprazole (PROTONIX) 40 MG tablet Take 1 tablet (40 mg total) by mouth 2 (two) times daily. 30 tablet 11   pantoprazole (PROTONIX) 40 MG tablet Take 1 tablet (40 mg total) by mouth 2 (two) times daily before a meal. 180 tablet 3   polyethylene glycol (MIRALAX / GLYCOLAX) 17 g packet Take 17 g by mouth daily.  spironolactone (ALDACTONE) 100 MG tablet Take 2 tablets (200 mg total) by mouth daily. 180 tablet 3   vitamin C (ASCORBIC ACID) 500 MG tablet Take 500 mg by mouth every other day.     Current Facility-Administered Medications  Medication Dose Route Frequency Provider Last Rate Last Admin   albumin human 25 % solution 12.5 g  12.5 g Intravenous Once Irene Shipper, MD        PHYSICAL EXAMINATION: ECOG PERFORMANCE STATUS: 1 - Symptomatic but completely ambulatory  Vitals:   11/30/20 0936  BP: 121/68  Pulse: 62  Resp: 18  Temp: 98.6 F (37 C)  SpO2: 99%    Filed Weights   11/30/20 0936  Weight: 176 lb (79.8 kg)    .Body mass index is 29.74 kg/m.  NAD GENERAL:alert, in no acute distress and comfortable SKIN: no acute rashes, no significant lesions EYES: conjunctiva are pink and non-injected, sclera anicteric OROPHARYNX: MMM, no exudates, no oropharyngeal erythema or ulceration NECK: supple, no JVD LYMPH:  no palpable lymphadenopathy in the cervical, axillary or inguinal regions LUNGS: clear to auscultation b/l with normal respiratory effort HEART: regular rate & rhythm ABDOMEN:  normoactive bowel sounds , non tender, distended Ascites +ve Extremity: no pedal edema PSYCH: alert & oriented x 3 with fluent speech NEURO: no focal motor/sensory deficits   LABORATORY DATA:  I have reviewed the data as listed  . CBC Latest Ref Rng & Units 11/30/2020 05/26/2020 03/17/2020  WBC 4.0 - 10.5 K/uL 3.7(L) 3.2(L) 4.0  Hemoglobin 12.0 - 15.0 g/dL 12.7 12.9 14.2  Hematocrit 36.0 -  46.0 % 36.7 38.0 42.1  Platelets 150 - 400 K/uL 136(L) 118(L) 145(L)    CMP Latest Ref Rng & Units 12/06/2020 11/30/2020 11/07/2020  Glucose 70 - 99 mg/dL 71 109(H) 125(H)  BUN 6 - 23 mg/dL _0 Creatinine 0.40 - 1.20 mg/dL 0.94 0.97 0.95  Sodium 135 - 145 mEq/L 132(L) 137 132(L)  Potassium 3.5 - 5.1 mEq/L 4.5 4.4 4.4  Chloride 96 - 112 mEq/L 99 104 100  CO2 19 - 32 mEq/L _1 Calcium 8.4 - 10.5 mg/dL 9.6 9.2 9.3  Total Protein 6.5 - 8.1 g/dL - 6.6 -  Total Bilirubin 0.3 - 1.2 mg/dL - 0.8 -  Alkaline Phos 38 - 126 U/L - 80 -  AST 15 - 41 U/L - 29 -  ALT 0 - 44 U/L - 17 -   AFP tumor marker 3.7  RADIOLOGY  MRI Abdomen W WO Contrast 10/28/17  IMPRESSION: 1.2 cm hypervascular mass in the dome of the right hepatic lobe shows no significant change, however Imaging findings are diagnostic of hepatocellular carcinoma in the setting of cirrhosis. No evidence of abdominal metastatic disease. Hepatic cirrhosis, and mild splenomegaly consistent with portal venous hypertension. Moderate ascites, increased since prior study.   MRI abd w and wo contrast 04/23/17 stable appearance of segment 7 liver lesion. Highly suspicious for hepatocellular carcinoma. Cirrhosis, mild hepatic steatosis and splenomegaly.   MRI abd w and wo contrast 10/22/2016 CLINICAL DATA:  Followup of liver lesions. Nonalcoholic steatohepatitis induced cirrhosis. Thrombocytopenia. EXAM: MRI ABDOMEN WITHOUT AND WITH CONTRAST TECHNIQUE: Multiplanar multisequence MR imaging of the abdomen was performed both before and after the administration of intravenous contrast. CONTRAST:  5m EOVIST GADOXETATE DISODIUM 0.25 MOL/L IV SOLN COMPARISON:  MRI of 02/27/2016.  Clinic note of 04/02/2016. FINDINGS: Motion degradation involves the arterial phase post-contrast images primarily. Lower chest: Normal heart size without pericardial or pleural  effusion. Hepatobiliary: Moderate to marked cirrhosis. Segment 7 liver  lesion has undergone mild interval enlargement. For example, on T2 weighted imaging, measures 12 x 10 mm (image 13/series 7) versus 10 x 9 mm on the prior. On post-contrast image 27/ series 502, measures 17 x 13 mm today versus 14 x 10 mm on the prior (when remeasured). Demonstrates arterial and portal venous phase hyper enhancement with relative isointensity on more delayed post-contrast imaging. Left hepatic lobe tiny lesion is likely a cyst and is similar. Mild hepatic steatosis. Cholecystectomy, without biliary ductal dilatation. Pancreas:  Normal, without mass or ductal dilatation. Spleen:  Mild splenomegaly, 14.3 cm craniocaudal. Adrenals/Urinary Tract: Normal adrenal glands. Normal kidneys, without hydronephrosis. Stomach/Bowel: Normal stomach and abdominal bowel loops. Vascular/Lymphatic: Patent hepatic and portal veins. No specific evidence of portal venous hypertension. Circumaortic left renal vein. No retroperitoneal or retrocrural adenopathy. Other:  Small volume perihepatic ascites is unchanged Musculoskeletal: No acute osseous abnormality. IMPRESSION: 1. Mild interval enlargement of a segment 7 liver lesion, highly suspicious for hepatocellular carcinoma. 2. Cirrhosis, mild hepatic steatosis, and mild splenomegaly. Electronically Signed   By: Abigail Miyamoto M.D.   On: 10/22/2016 10:22     ASSESSMENT & PLAN:   80 y.o. female with  #1 Chronic thrombocytopenia. Platelet counts stable at 107k Likely related to cirrhosis related to NASH with mild splenomegaly.  B12, TSH, LDH within normal limits. MRI abd shows mild splenomegaly at 14.5 cm  #2 liver cirrhosis likely related to NASH hepatitis profile neg, ferritin unrevealing. Recent EGD showed no evidence of varices. Subtle changes of portal hypertensive gastropathy.  #3 Lesion in the RIGHT hepatic lobe has imaging characteristics highly suspicious for hepatic cellular carcinoma including arterial enhancement and a new  peripheral rim.   04/23/17 MRI results do not indicate a significant change in the liver nodule   -Rpt MRI from 10/28/17 shows overall stable liver lesion at 1.2cm, mild splenomegaly, moderate ascites, liver cirrhosis.   04/16/18 MRI Abdomen which revealed There has been mild increase in size of hypervascular liver lesion within posterior dome. Enhancement characteristics are compatible with LR-5 (definitely [place]). No new lesions identified. 2. Morphologic features of liver compatible with cirrhosis with stigmata of portal venous hypertension.  03/11/2019:MRI abd w and w/o contrast  IMPRESSION: 1. Treated segment 7 right liver mass is decreased in size and demonstrates no internal solid enhancement to suggest residual or recurrent viability. Geographic signal intensity changes at the liver dome surrounding the treated lesion, favor post treatment change. No new liver masses. Continued MRI surveillance recommended. 2. Cirrhosis. Small to moderate volume ascites. Mild splenomegaly. Small gastroesophageal and paraumbilical varices.   PLAN: -Discussed pt labwork today, 11/30/2020; plt stable at 130k, normal hgb -MRI 09/2020- no evidence of HCC progression -No lab, clinical, or radiographic evidence of Hepatic Cellular Carcinoma recurrence at this time.  -Recommend pt f/u with Dr. Laurence Ferrari and Dr. Henrene Pastor as scheduled -Continue Lasix as prescribed -Due to stability of labs will see back in 12 months with labs    FOLLOW UP: RTC with Dr Irene Limbo with labs in 12 months   The total time spent in the appt was 20 minutes and more than 50% was on counseling and direct patient cares.  All of the patient's questions were answered with apparent satisfaction. The patient knows to call the clinic with any problems, questions or concerns.    Sullivan Lone MD [place] AAHIVMS [facility] Hematology/Oncology Physician [place]  (Office):       [phone] (  Work cell):  [phone] (Fax):            [phone]  I, Reinaldo Raddle, am acting as scribe for Dr. Sullivan Lone, MD.    .I have reviewed the above documentation for accuracy and completeness, and I agree with the above. Brunetta Genera MD

## 2020-11-30 ENCOUNTER — Telehealth: Payer: Self-pay | Admitting: Internal Medicine

## 2020-11-30 ENCOUNTER — Inpatient Hospital Stay: Payer: Medicare Other | Attending: Hematology | Admitting: Hematology

## 2020-11-30 ENCOUNTER — Inpatient Hospital Stay: Payer: Medicare Other

## 2020-11-30 ENCOUNTER — Other Ambulatory Visit: Payer: Self-pay

## 2020-11-30 VITALS — BP 121/68 | HR 62 | Temp 98.6°F | Resp 18 | Ht 64.5 in | Wt 176.0 lb

## 2020-11-30 DIAGNOSIS — Z79899 Other long term (current) drug therapy: Secondary | ICD-10-CM | POA: Insufficient documentation

## 2020-11-30 DIAGNOSIS — K766 Portal hypertension: Secondary | ICD-10-CM | POA: Diagnosis not present

## 2020-11-30 DIAGNOSIS — L439 Lichen planus, unspecified: Secondary | ICD-10-CM | POA: Diagnosis not present

## 2020-11-30 DIAGNOSIS — R188 Other ascites: Secondary | ICD-10-CM | POA: Insufficient documentation

## 2020-11-30 DIAGNOSIS — K7581 Nonalcoholic steatohepatitis (NASH): Secondary | ICD-10-CM | POA: Insufficient documentation

## 2020-11-30 DIAGNOSIS — Z86010 Personal history of colonic polyps: Secondary | ICD-10-CM | POA: Insufficient documentation

## 2020-11-30 DIAGNOSIS — D696 Thrombocytopenia, unspecified: Secondary | ICD-10-CM | POA: Insufficient documentation

## 2020-11-30 DIAGNOSIS — K5900 Constipation, unspecified: Secondary | ICD-10-CM | POA: Insufficient documentation

## 2020-11-30 DIAGNOSIS — D731 Hypersplenism: Secondary | ICD-10-CM | POA: Insufficient documentation

## 2020-11-30 DIAGNOSIS — K769 Liver disease, unspecified: Secondary | ICD-10-CM | POA: Diagnosis not present

## 2020-11-30 DIAGNOSIS — R162 Hepatomegaly with splenomegaly, not elsewhere classified: Secondary | ICD-10-CM | POA: Insufficient documentation

## 2020-11-30 DIAGNOSIS — K7460 Unspecified cirrhosis of liver: Secondary | ICD-10-CM | POA: Diagnosis not present

## 2020-11-30 DIAGNOSIS — C220 Liver cell carcinoma: Secondary | ICD-10-CM

## 2020-11-30 DIAGNOSIS — Z7984 Long term (current) use of oral hypoglycemic drugs: Secondary | ICD-10-CM | POA: Insufficient documentation

## 2020-11-30 DIAGNOSIS — E119 Type 2 diabetes mellitus without complications: Secondary | ICD-10-CM | POA: Diagnosis not present

## 2020-11-30 DIAGNOSIS — E039 Hypothyroidism, unspecified: Secondary | ICD-10-CM | POA: Insufficient documentation

## 2020-11-30 LAB — CMP (CANCER CENTER ONLY)
ALT: 17 U/L (ref 0–44)
AST: 29 U/L (ref 15–41)
Albumin: 3.5 g/dL (ref 3.5–5.0)
Alkaline Phosphatase: 80 U/L (ref 38–126)
Anion gap: 7 (ref 5–15)
BUN: 22 mg/dL (ref 8–23)
CO2: 26 mmol/L (ref 22–32)
Calcium: 9.2 mg/dL (ref 8.9–10.3)
Chloride: 104 mmol/L (ref 98–111)
Creatinine: 0.97 mg/dL (ref 0.44–1.00)
GFR, Estimated: 59 mL/min — ABNORMAL LOW (ref >60)
Glucose, Bld: 109 mg/dL — ABNORMAL HIGH (ref 70–99)
Potassium: 4.4 mmol/L (ref 3.5–5.1)
Sodium: 137 mmol/L (ref 135–145)
Total Bilirubin: 0.8 mg/dL (ref 0.3–1.2)
Total Protein: 6.6 g/dL (ref 6.5–8.1)

## 2020-11-30 LAB — CBC WITH DIFFERENTIAL/PLATELET
Abs Immature Granulocytes: 0.02 10*3/uL (ref 0.00–0.07)
Basophils Absolute: 0 10*3/uL (ref 0.0–0.1)
Basophils Relative: 1 %
Eosinophils Absolute: 0.1 10*3/uL (ref 0.0–0.5)
Eosinophils Relative: 2 %
HCT: 36.7 % (ref 36.0–46.0)
Hemoglobin: 12.7 g/dL (ref 12.0–15.0)
Immature Granulocytes: 1 %
Lymphocytes Relative: 10 %
Lymphs Abs: 0.4 10*3/uL — ABNORMAL LOW (ref 0.7–4.0)
MCH: 31.9 pg (ref 26.0–34.0)
MCHC: 34.6 g/dL (ref 30.0–36.0)
MCV: 92.2 fL (ref 80.0–100.0)
Monocytes Absolute: 0.4 10*3/uL (ref 0.1–1.0)
Monocytes Relative: 11 %
Neutro Abs: 2.8 10*3/uL (ref 1.7–7.7)
Neutrophils Relative %: 75 %
Platelets: 136 10*3/uL — ABNORMAL LOW (ref 150–400)
RBC: 3.98 MIL/uL (ref 3.87–5.11)
RDW: 12.8 % (ref 11.5–15.5)
WBC: 3.7 10*3/uL — ABNORMAL LOW (ref 4.0–10.5)
nRBC: 0 % (ref 0.0–0.2)

## 2020-11-30 NOTE — Telephone Encounter (Signed)
Please arrange large-volume ultrasound-guided paracentesis.  Up to 8 L.  Provide IV albumin replacement per protocol.  Send fluid for cell count with differential.  Thanks

## 2020-11-30 NOTE — Telephone Encounter (Signed)
Pt calling requesting a paracentesis, reports her abdomen is more distended. Pt states she weighed and believes her wt is up somewhere around 15 pounds. Please advise.

## 2020-11-30 NOTE — Telephone Encounter (Signed)
Patient is calling to speak with you regarding paracentesis.

## 2020-11-30 NOTE — Telephone Encounter (Signed)
Pt scheduled for IR para at [facility] 12/06/20 at 10am. Pt aware of appt.

## 2020-12-01 LAB — AFP TUMOR MARKER: AFP, Serum, Tumor Marker: 3.7 ng/mL (ref 0.0–9.2)

## 2020-12-06 ENCOUNTER — Other Ambulatory Visit (INDEPENDENT_AMBULATORY_CARE_PROVIDER_SITE_OTHER): Payer: Medicare Other

## 2020-12-06 ENCOUNTER — Ambulatory Visit (HOSPITAL_COMMUNITY)
Admission: RE | Admit: 2020-12-06 | Discharge: 2020-12-06 | Disposition: A | Discharge status: Home / Self Care | Payer: Medicare Other | Source: Ambulatory Visit | Attending: Internal Medicine | Admitting: Internal Medicine

## 2020-12-06 ENCOUNTER — Other Ambulatory Visit: Payer: Self-pay

## 2020-12-06 DIAGNOSIS — K7460 Unspecified cirrhosis of liver: Secondary | ICD-10-CM | POA: Diagnosis not present

## 2020-12-06 DIAGNOSIS — R188 Other ascites: Secondary | ICD-10-CM

## 2020-12-06 DIAGNOSIS — K7469 Other cirrhosis of liver: Secondary | ICD-10-CM | POA: Diagnosis not present

## 2020-12-06 HISTORY — PX: IR PARACENTESIS: IMG2679

## 2020-12-06 LAB — BASIC METABOLIC PANEL
BUN: 22 mg/dL (ref 6–23)
CO2: 24 mEq/L (ref 19–32)
Calcium: 9.6 mg/dL (ref 8.4–10.5)
Chloride: 99 mEq/L (ref 96–112)
Creatinine, Ser: 0.94 mg/dL (ref 0.40–1.20)
GFR: 57.22 mL/min — ABNORMAL LOW (ref >60.00)
Glucose, Bld: 71 mg/dL (ref 70–99)
Potassium: 4.5 mEq/L (ref 3.5–5.1)
Sodium: 132 mEq/L — ABNORMAL LOW (ref 135–145)

## 2020-12-06 LAB — BODY FLUID CELL COUNT WITH DIFFERENTIAL
Eos, Fluid: 0 %
Lymphs, Fluid: 70 %
Monocyte-Macrophage-Serous Fluid: 3 % — ABNORMAL LOW (ref 50–90)
Neutrophil Count, Fluid: 27 % — ABNORMAL HIGH (ref 0–25)
Total Nucleated Cell Count, Fluid: 115 cu mm (ref 0–1000)

## 2020-12-06 MED ORDER — LIDOCAINE HCL (PF) 1 % IJ SOLN
INTRAMUSCULAR | Status: DC | PRN
  Administered 2020-12-06: 15 mL

## 2020-12-06 MED ORDER — LIDOCAINE HCL 1 % IJ SOLN
INTRAMUSCULAR | Status: AC
  Filled 2020-12-06: qty 20

## 2020-12-06 NOTE — Procedures (Signed)
PROCEDURE SUMMARY:  Successful US guided paracentesis from right lateral abdomen.  Yielded 4.5 liters of cloudy yellow fluid.  No immediate complications.  Patient tolerated well.  EBL = trace   Mirenda Baltazar S Jazzmen Restivo PA-C 12/06/2020 1:12 PM

## 2020-12-08 LAB — PATHOLOGIST SMEAR REVIEW

## 2020-12-19 ENCOUNTER — Ambulatory Visit: Payer: Medicare Other | Admitting: Internal Medicine

## 2021-01-24 ENCOUNTER — Other Ambulatory Visit: Payer: Self-pay | Admitting: Endocrinology

## 2021-02-12 ENCOUNTER — Telehealth: Payer: Self-pay | Admitting: Internal Medicine

## 2021-02-12 NOTE — Telephone Encounter (Signed)
Left message for pt that she is over due for some labwork and she can come to the lab M-F between 7:30am-5pm, no appt needed.

## 2021-02-12 NOTE — Telephone Encounter (Signed)
Inbound call from patient. States she believes she missed blood work and wants a call back to discuss.

## 2021-02-19 ENCOUNTER — Other Ambulatory Visit (INDEPENDENT_AMBULATORY_CARE_PROVIDER_SITE_OTHER): Payer: Medicare Other

## 2021-02-19 DIAGNOSIS — R188 Other ascites: Secondary | ICD-10-CM

## 2021-02-19 DIAGNOSIS — K7460 Unspecified cirrhosis of liver: Secondary | ICD-10-CM

## 2021-02-19 LAB — BASIC METABOLIC PANEL
BUN: 19 mg/dL (ref 6–23)
CO2: 26 mEq/L (ref 19–32)
Calcium: 9.6 mg/dL (ref 8.4–10.5)
Chloride: 101 mEq/L (ref 96–112)
Creatinine, Ser: 0.98 mg/dL (ref 0.40–1.20)
GFR: 54.35 mL/min — ABNORMAL LOW (ref >60.00)
Glucose, Bld: 84 mg/dL (ref 70–99)
Potassium: 4.5 mEq/L (ref 3.5–5.1)
Sodium: 135 mEq/L (ref 135–145)

## 2021-02-20 ENCOUNTER — Other Ambulatory Visit: Payer: Self-pay

## 2021-02-20 DIAGNOSIS — K7460 Unspecified cirrhosis of liver: Secondary | ICD-10-CM

## 2021-02-22 DIAGNOSIS — Z7984 Long term (current) use of oral hypoglycemic drugs: Secondary | ICD-10-CM | POA: Diagnosis not present

## 2021-02-22 DIAGNOSIS — Z961 Presence of intraocular lens: Secondary | ICD-10-CM | POA: Diagnosis not present

## 2021-02-22 DIAGNOSIS — E119 Type 2 diabetes mellitus without complications: Secondary | ICD-10-CM | POA: Diagnosis not present

## 2021-03-06 ENCOUNTER — Ambulatory Visit: Payer: Medicare Other | Admitting: Endocrinology

## 2021-03-09 IMAGING — XA IR ULTRASOUND GUIDANCE VASC ACCESS RIGHT
2 series · 16 of 24 positions shown · non-contrast
Comparison: none

INDICATION: 77-year-old female with NASH cirrhosis and an enlarging lie rads
category 5 lesion in hepatic segment 7 consistent with
hepatocellular carcinoma. She presents today for pre [AGE] radiation
segmentectomy planning.

[Series 16: recon 2 · 0.51mm/px · 3 of 49 slices shown]
[im 1/49  full-range]
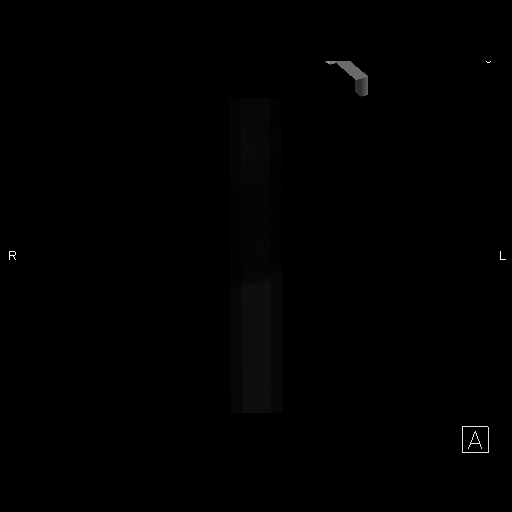
[im 33/49]
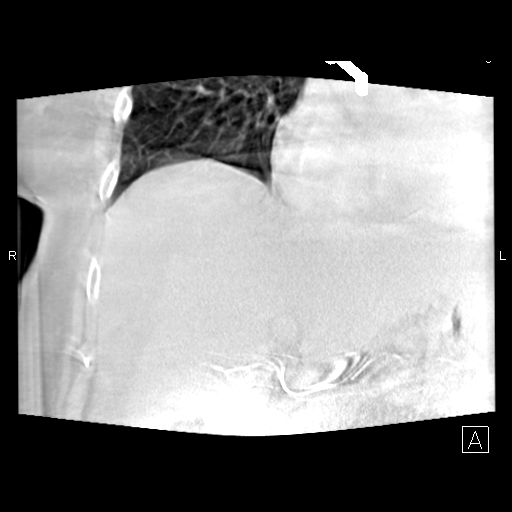
[im 49/49]
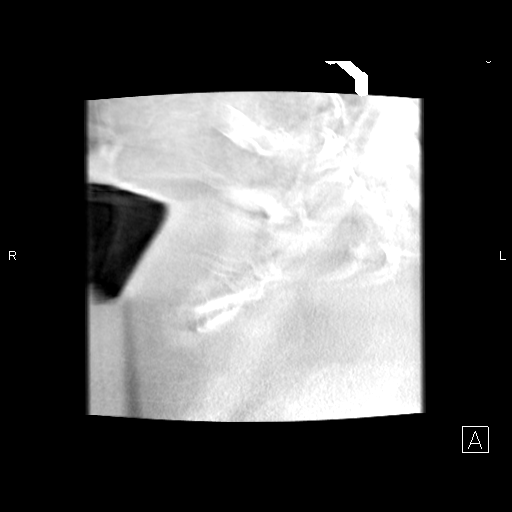

[Series 992: recon 4 sagittal · 0.51mm/px · 13 of 225 slices shown]
[im 12/225  full-range]
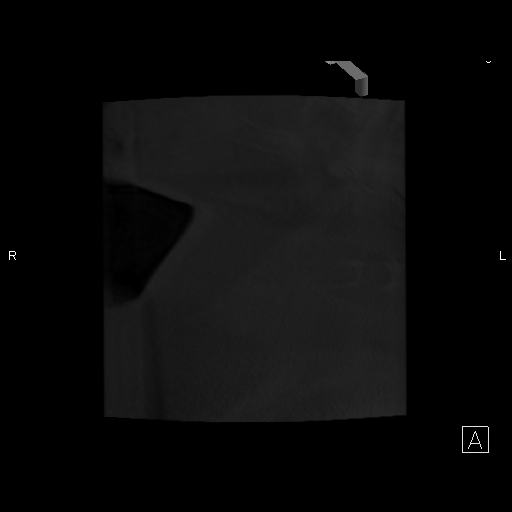
[im 24/225]
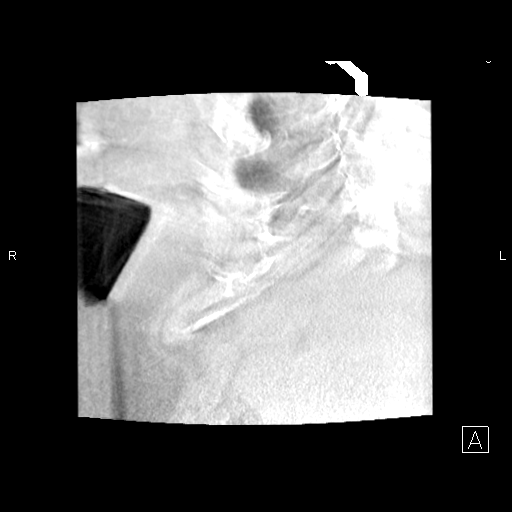
[im 48/225]
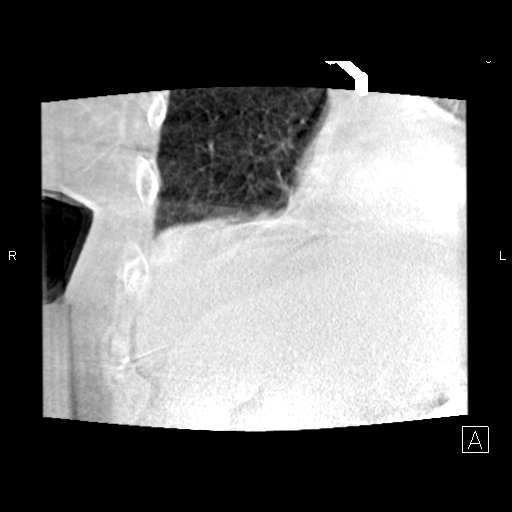
[im 59/225]
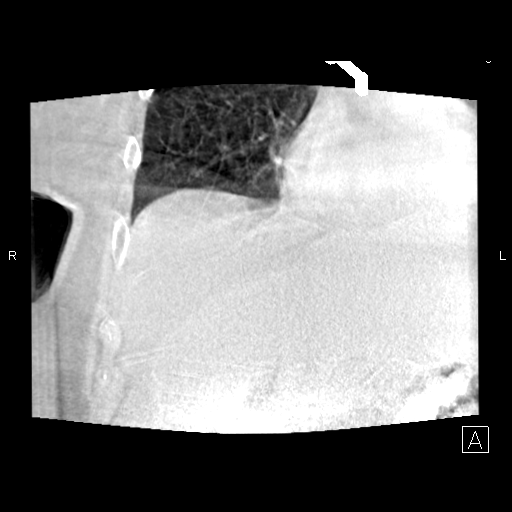
[im 83/225]
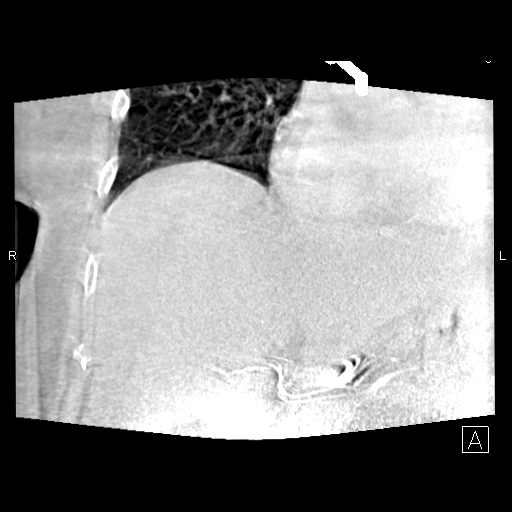
[im 95/225]
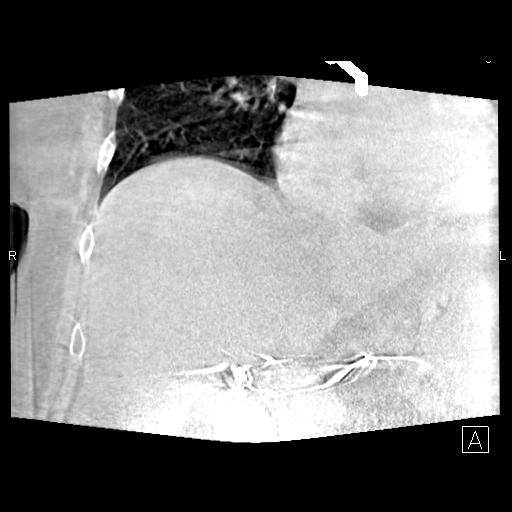
[im 118/225]
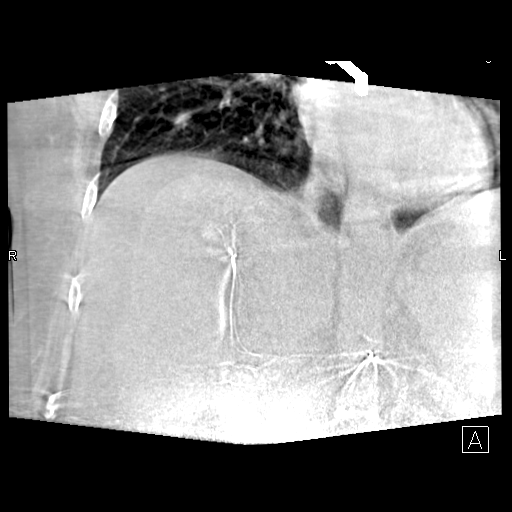
[im 130/225]
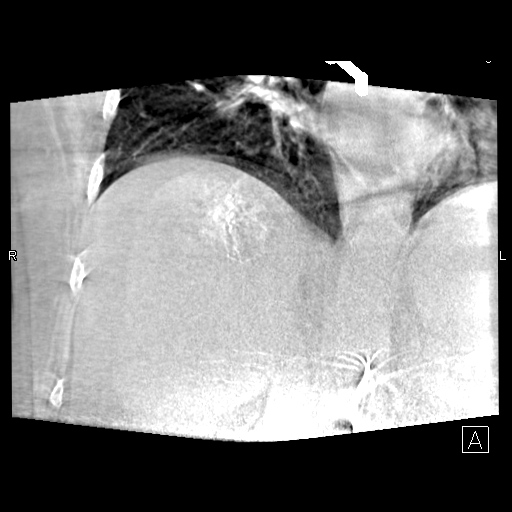
[im 154/225]
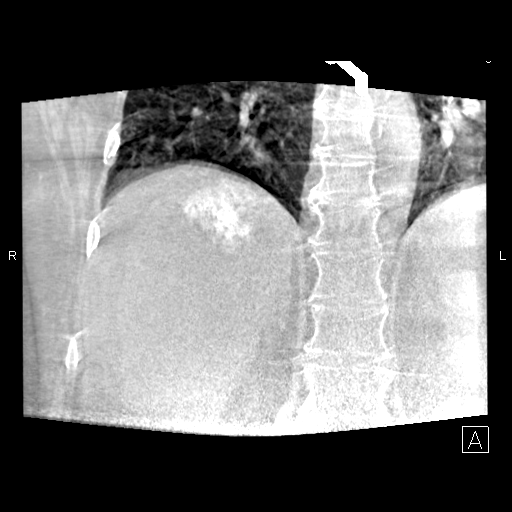
[im 166/225]
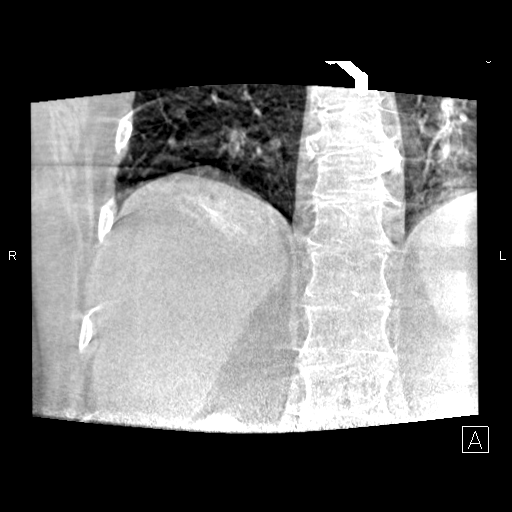
[im 189/225]
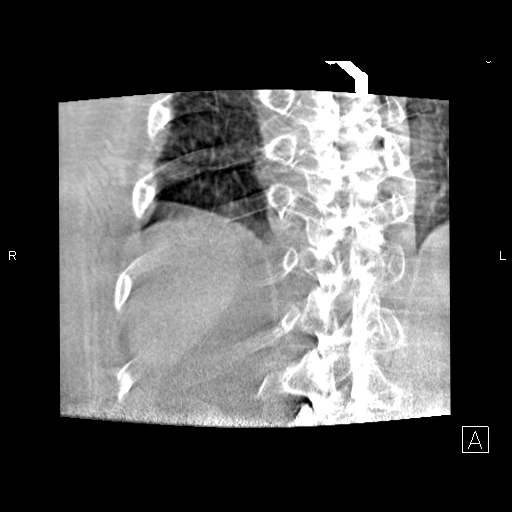
[im 201/225]
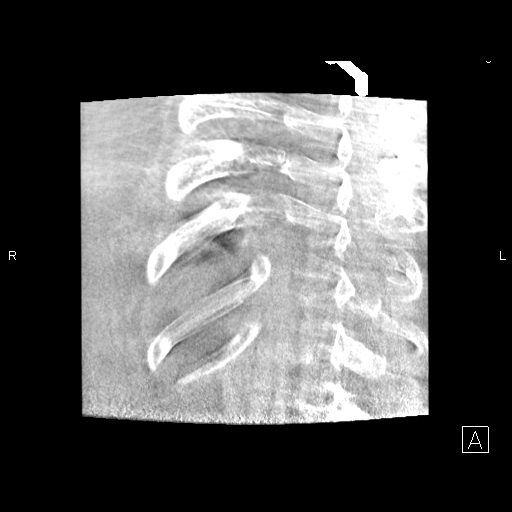
[im 225/225]
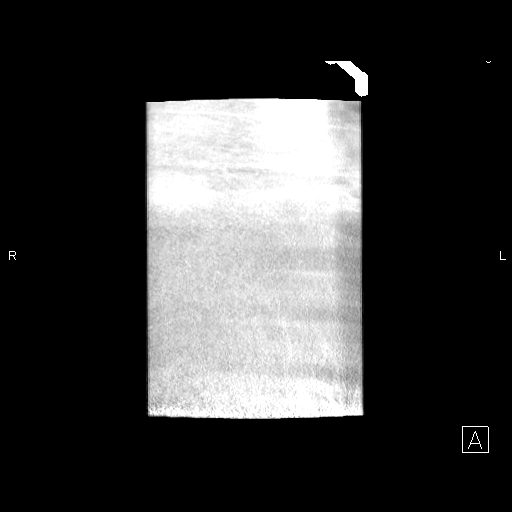

[16 of 24 positions shown; findings below may reference images not displayed]

EXAM:
SELECTIVE VISCERAL ARTERIOGRAPHY; ADDITIONAL ARTERIOGRAPHY; IR
ULTRASOUND GUIDANCE VASC ACCESS RIGHT

1. Ultrasound-guided vascular access, right common femoral artery
2. Catheterization and arteriography of the common trunk of the
inferior phrenic arteries
3. Catheterization and arteriography of the celiac axis
4. Catheterization and arteriography of the common hepatic artery
5. Catheterization and arteriography of the right hepatic artery
6. Catheterization and arteriography of the segment 7 hepatic artery
7. Catheterization and arteriography of the segment 8 hepatic artery
8. Catheterization and arteriography of the SMA

MEDICATIONS:
None

ANESTHESIA/SEDATION:
Moderate (conscious) sedation was employed during this procedure. A
total of Versed 1 mg and Fentanyl 50 mcg was administered
intravenously.

Moderate Sedation Time: 55 minutes. The patient's level of
consciousness and vital signs were monitored continuously by
radiology nursing throughout the procedure under my direct
supervision.

CONTRAST:  45 mL Omnipaque 300

FLUOROSCOPY TIME:  Fluoroscopy Time: 6 minutes 18 seconds (8846
mGy).

COMPLICATIONS:
None immediate.



The right common femoral artery was interrogated with ultrasound and
found to be widely patent. An image was obtained and stored for the
medical record. Local anesthesia was attained by infiltration with
1% lidocaine. A small dermatotomy was made. Under real-time
sonographic guidance, the vessel was punctured with a 21 gauge
micropuncture needle. Using standard technique, the initial micro
needle was exchanged over a 0.018 micro wire for a transitional 4
French micro sheath. The micro sheath was then exchanged over a
0.035 wire for a 5 French vascular sheath.

A C2 cobra catheter was advanced over a Bentson wire into the
abdominal aorta. The first branch of the abdominal aorta was
selected with the catheter. Digital angiography was performed. This
vessel is a common origin of the bilateral inferior phrenic
arteries. Normal opacification of the diaphragmatic crus and
diaphragm. No evidence of accessory perfusion to the liver dome or
segment 7 lesion.

The C2 cobra catheter was then used to select the celiac artery. A
celiac arteriogram was performed. Conventional hepatic arterial
anatomy. The GDA is patent. The splenic artery is patent. Normal
appearing left gastric artery.

The C2 cobra catheter was subsequently advanced over a Glidewire
into the common hepatic artery. Additional arteriography was
performed. Findings confirm conventional hepatic arterial anatomy. A
hypervascular tumor blush is identified in hepatic segment 7. The
tumor blush measures approximately 1.6 by 1.1 cm.

A high-flow Renegade microcatheter was next advanced over a Fathom
16 wire. The microcatheter was advanced into the right hepatic
artery. Arteriography was performed in multiple obliquities. The
common origin of the segment [DATE] artery was identified. The
microcatheter was advanced into the segment 7 hepatic artery.
Arteriography was performed confirming tissue blush within the
segmental arterial supply. Cone beam CT was then performed.
Correlation of the cone beam CT images confirms hypervascular tumor
in the same location as seen on the prior CT arteriogram.

For completion and to exclude any supply along the more lateral
margin. The microcatheter was used to select the segment 8 hepatic
artery. Arteriography was performed. No evidence of hypervascular
tumor blush.

The microcatheter was brought back into the right main hepatic
artery. Technetium labeled macro aggregated albumin was then
injected through the catheter and the catheter was flushed. The
catheter was subsequently disposed of appropriately.

The 5 French catheter was then used to select the superior
mesenteric artery. Superior mesenteric arteriography was performed.
No evidence of replaced or accessory right hepatic artery.

The catheter was removed. A limited right common femoral arteriogram
was performed confirming common femoral arterial access. Hemostasis
was attained with the assistance of a 5 French Cordis ExoSeal extra
arterial collagen plug.
IMPRESSION: 1. Normal hepatic arterial anatomy.
2. Successful identification of the segment 7 hepatic artery which
supplies the hypervascular tumor.
3. Patient is a candidate for segment [AGE] radiation segmentectomy.

## 2021-03-14 ENCOUNTER — Other Ambulatory Visit: Payer: Self-pay

## 2021-03-14 ENCOUNTER — Other Ambulatory Visit: Payer: Self-pay | Admitting: Interventional Radiology

## 2021-03-14 ENCOUNTER — Other Ambulatory Visit: Payer: Self-pay | Admitting: Family Medicine

## 2021-03-14 DIAGNOSIS — C220 Liver cell carcinoma: Secondary | ICD-10-CM

## 2021-03-14 DIAGNOSIS — Z1231 Encounter for screening mammogram for malignant neoplasm of breast: Secondary | ICD-10-CM

## 2021-03-21 ENCOUNTER — Other Ambulatory Visit: Payer: Self-pay

## 2021-03-21 ENCOUNTER — Telehealth: Payer: Self-pay | Admitting: Internal Medicine

## 2021-03-21 DIAGNOSIS — K7460 Unspecified cirrhosis of liver: Secondary | ICD-10-CM

## 2021-03-21 DIAGNOSIS — R188 Other ascites: Secondary | ICD-10-CM

## 2021-03-21 NOTE — Telephone Encounter (Signed)
Pt states it has been 3 mths since her last paracentesis. Reports she is getting uncomfortable and her pants are fitting tighter. She is requesting to have a para done. Please advise.

## 2021-03-21 NOTE — Telephone Encounter (Signed)
1.  Arrange ultrasound-guided paracentesis, up to 8 L 2.  Provide IV albumin replacement per protocol  3.  Send the fluid for cell count with differential

## 2021-03-21 NOTE — Telephone Encounter (Signed)
Inbound call from patient. States she need a paracentesis.

## 2021-03-21 NOTE — Telephone Encounter (Signed)
Pt scheduled for IR para at [facility] per Dr. Blanch Media orders below. Appt is Friday 03/23/21 at 10am, pt to arrive there at 9:45am. Pt aware of appt.

## 2021-03-23 ENCOUNTER — Ambulatory Visit (HOSPITAL_COMMUNITY)
Admission: RE | Admit: 2021-03-23 | Discharge: 2021-03-23 | Disposition: A | Discharge status: Home / Self Care | Payer: Medicare Other | Source: Ambulatory Visit | Attending: Internal Medicine | Admitting: Internal Medicine

## 2021-03-23 ENCOUNTER — Other Ambulatory Visit: Payer: Self-pay

## 2021-03-23 DIAGNOSIS — R188 Other ascites: Secondary | ICD-10-CM | POA: Diagnosis not present

## 2021-03-23 DIAGNOSIS — K7460 Unspecified cirrhosis of liver: Secondary | ICD-10-CM

## 2021-03-23 HISTORY — PX: IR PARACENTESIS: IMG2679

## 2021-03-23 LAB — BODY FLUID CELL COUNT WITH DIFFERENTIAL
Eos, Fluid: 0 %
Lymphs, Fluid: 75 %
Monocyte-Macrophage-Serous Fluid: 12 % — ABNORMAL LOW (ref 50–90)
Neutrophil Count, Fluid: 13 % (ref 0–25)
Total Nucleated Cell Count, Fluid: 248 cu mm (ref 0–1000)

## 2021-03-23 MED ORDER — LIDOCAINE HCL 1 % IJ SOLN
INTRAMUSCULAR | Status: AC
  Filled 2021-03-23: qty 20

## 2021-03-23 MED ORDER — LIDOCAINE HCL (PF) 1 % IJ SOLN
INTRAMUSCULAR | Status: DC | PRN
  Administered 2021-03-23: 10 mL

## 2021-03-23 NOTE — Procedures (Signed)
PROCEDURE SUMMARY:  Successful US guided paracentesis from LLQ.  Yielded 3.8L of cloudy yellow fluid.  No immediate complications.  Pt tolerated well.   Specimen was sent for labs.  EBL < 60m  Danen Lapaglia PA-C 03/23/2021 3:40 PM

## 2021-03-27 DIAGNOSIS — Z20828 Contact with and (suspected) exposure to other viral communicable diseases: Secondary | ICD-10-CM | POA: Diagnosis not present

## 2021-03-28 LAB — PATHOLOGIST SMEAR REVIEW

## 2021-04-02 ENCOUNTER — Ambulatory Visit (HOSPITAL_COMMUNITY)
Admission: RE | Admit: 2021-04-02 | Discharge: 2021-04-02 | Disposition: A | Discharge status: Home / Self Care | Payer: Medicare Other | Source: Ambulatory Visit | Attending: Interventional Radiology | Admitting: Interventional Radiology

## 2021-04-02 ENCOUNTER — Other Ambulatory Visit: Payer: Self-pay

## 2021-04-02 DIAGNOSIS — K766 Portal hypertension: Secondary | ICD-10-CM | POA: Diagnosis not present

## 2021-04-02 DIAGNOSIS — C220 Liver cell carcinoma: Secondary | ICD-10-CM | POA: Insufficient documentation

## 2021-04-02 DIAGNOSIS — Z8505 Personal history of malignant neoplasm of liver: Secondary | ICD-10-CM | POA: Diagnosis not present

## 2021-04-02 DIAGNOSIS — K7460 Unspecified cirrhosis of liver: Secondary | ICD-10-CM | POA: Diagnosis not present

## 2021-04-02 DIAGNOSIS — K7689 Other specified diseases of liver: Secondary | ICD-10-CM | POA: Diagnosis not present

## 2021-04-02 MED ORDER — GADOBUTROL 1 MMOL/ML IV SOLN
7.0000 mL | Freq: Once | INTRAVENOUS | Status: AC | PRN
  Administered 2021-04-02: 7 mL via INTRAVENOUS

## 2021-04-04 ENCOUNTER — Other Ambulatory Visit (HOSPITAL_COMMUNITY)
Admission: RE | Admit: 2021-04-04 | Discharge: 2021-04-04 | Disposition: A | Discharge status: Home / Self Care | Payer: Medicare Other | Source: Ambulatory Visit | Attending: Interventional Radiology | Admitting: Interventional Radiology

## 2021-04-04 DIAGNOSIS — C220 Liver cell carcinoma: Secondary | ICD-10-CM | POA: Diagnosis not present

## 2021-04-04 LAB — CBC
HCT: 37.2 % (ref 36.0–46.0)
Hemoglobin: 12.6 g/dL (ref 12.0–15.0)
MCH: 32.1 pg (ref 26.0–34.0)
MCHC: 33.9 g/dL (ref 30.0–36.0)
MCV: 94.7 fL (ref 80.0–100.0)
Platelets: 124 10*3/uL — ABNORMAL LOW (ref 150–400)
RBC: 3.93 MIL/uL (ref 3.87–5.11)
RDW: 12.5 % (ref 11.5–15.5)
WBC: 3.1 10*3/uL — ABNORMAL LOW (ref 4.0–10.5)
nRBC: 0 % (ref 0.0–0.2)

## 2021-04-04 LAB — COMPREHENSIVE METABOLIC PANEL
ALT: 16 U/L (ref 0–44)
AST: 26 U/L (ref 15–41)
Albumin: 3.5 g/dL (ref 3.5–5.0)
Alkaline Phosphatase: 80 U/L (ref 38–126)
Anion gap: 7 (ref 5–15)
BUN: 19 mg/dL (ref 8–23)
CO2: 25 mmol/L (ref 22–32)
Calcium: 8.9 mg/dL (ref 8.9–10.3)
Chloride: 102 mmol/L (ref 98–111)
Creatinine, Ser: 0.84 mg/dL (ref 0.44–1.00)
GFR, Estimated: >60 mL/min (ref >60)
Glucose, Bld: 95 mg/dL (ref 70–99)
Potassium: 4.3 mmol/L (ref 3.5–5.1)
Sodium: 134 mmol/L — ABNORMAL LOW (ref 135–145)
Total Bilirubin: 1 mg/dL (ref 0.3–1.2)
Total Protein: 6.7 g/dL (ref 6.5–8.1)

## 2021-04-04 LAB — PROTIME-INR
INR: 1 (ref 0.8–1.2)
Prothrombin Time: 13.5 seconds (ref 11.4–15.2)

## 2021-04-05 LAB — AFP TUMOR MARKER: AFP, Serum, Tumor Marker: 3.1 ng/mL (ref 0.0–8.7)

## 2021-04-09 DIAGNOSIS — E1169 Type 2 diabetes mellitus with other specified complication: Secondary | ICD-10-CM | POA: Diagnosis not present

## 2021-04-09 DIAGNOSIS — K429 Umbilical hernia without obstruction or gangrene: Secondary | ICD-10-CM | POA: Diagnosis not present

## 2021-04-09 DIAGNOSIS — B079 Viral wart, unspecified: Secondary | ICD-10-CM | POA: Diagnosis not present

## 2021-04-09 DIAGNOSIS — I10 Essential (primary) hypertension: Secondary | ICD-10-CM | POA: Diagnosis not present

## 2021-04-09 DIAGNOSIS — E039 Hypothyroidism, unspecified: Secondary | ICD-10-CM | POA: Diagnosis not present

## 2021-04-09 DIAGNOSIS — D696 Thrombocytopenia, unspecified: Secondary | ICD-10-CM | POA: Diagnosis not present

## 2021-04-09 DIAGNOSIS — E559 Vitamin D deficiency, unspecified: Secondary | ICD-10-CM | POA: Diagnosis not present

## 2021-04-09 DIAGNOSIS — Z23 Encounter for immunization: Secondary | ICD-10-CM | POA: Diagnosis not present

## 2021-04-09 DIAGNOSIS — C220 Liver cell carcinoma: Secondary | ICD-10-CM | POA: Diagnosis not present

## 2021-04-09 DIAGNOSIS — K7581 Nonalcoholic steatohepatitis (NASH): Secondary | ICD-10-CM | POA: Diagnosis not present

## 2021-04-09 DIAGNOSIS — M810 Age-related osteoporosis without current pathological fracture: Secondary | ICD-10-CM | POA: Diagnosis not present

## 2021-04-10 ENCOUNTER — Other Ambulatory Visit: Payer: Self-pay | Admitting: Internal Medicine

## 2021-04-11 ENCOUNTER — Ambulatory Visit
Admission: RE | Admit: 2021-04-11 | Discharge: 2021-04-11 | Disposition: A | Discharge status: Home / Self Care | Payer: Medicare Other | Source: Ambulatory Visit | Attending: Interventional Radiology | Admitting: Interventional Radiology

## 2021-04-11 DIAGNOSIS — R188 Other ascites: Secondary | ICD-10-CM | POA: Diagnosis not present

## 2021-04-11 DIAGNOSIS — C220 Liver cell carcinoma: Secondary | ICD-10-CM

## 2021-04-11 DIAGNOSIS — Z23 Encounter for immunization: Secondary | ICD-10-CM | POA: Diagnosis not present

## 2021-04-11 DIAGNOSIS — K7581 Nonalcoholic steatohepatitis (NASH): Secondary | ICD-10-CM | POA: Diagnosis not present

## 2021-04-11 HISTORY — PX: IR RADIOLOGIST EVAL & MGMT: IMG5224

## 2021-04-11 NOTE — Progress Notes (Signed)
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 Irene Limbo  History of Present Illness: Crystal Barnett is a 80 y.o. 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 MRI imaging was performed first in December 2016 to evaluate a region of possible concern in the left hepatic lobe. On that study, there was a small 9 mm enhancing focus in hepatic segment 7 in the posterosuperior aspect of the hepatic dome. This subsequently been followed at 6 month intervals with repeat MRI scans of the abdomen.   MRI from 04/16/18 demonstrates more definitive enlargement of the lesion now measuring up to 1.9 cm.  Additionally, there appears to be an enhancing pseudocapsule.  On the present study, the imaging characteristics are consistent with a Li-RADS 5 lesion which is diagnostic of hepatocellular carcinoma.  The very slow growth rate over the past 3 years suggests a low-grade or indolent malignancy at this time.   Due to the presence of ascites and the location of the lesion high in the right dome just under the lung, we elected to proceed with transarterial radio embolization segmentectomy (7) which was performed on 11/17/2018.   Today, we are having our 64-monthpost procedure follow-up consultation via telemedicine.  Mrs. PLegaspiis doing well.  She denies fatigue, abdominal pain, nausea, vomiting or other clinical symptoms.     MRI 03/11/19: Treated segment 7 right liver mass is decreased in size and demonstrates no internal solid enhancement to suggest residual or recurrent viability. Geographic signal intensity changes at the liver dome surrounding the treated lesion, favor post treatment change. No new liver masses. Continued MRI surveillance recommended.   MRI 07/05/19: Stable appearance of treated segment 7 right liver lesion which is decreased  in size without internal enhancement. No new liver Lesions.   MRI 10/01/19: Unchanged ablation site of the liver dome, hepatic segment VII, without residual contrast enhancement. No evidence of viability or recurrence. LI-RADS 5T. No new suspicious liver lesions.   MRI  12/31/19: Similar size of ablation defect within the high right hepatic lobe, without findings of recurrent or metastatic disease.  Cirrhosis and portal venous hypertension, without new hepatocellular carcinoma.   MRI 03/20/20: At the site of the prior segment 7 hepatocellular carcinoma that was shown on 04/16/2018, there is a small nonenhancing 1.0 by 0.6 cm ablation site with no significant recurrent arterial phase enhancement to suggest active malignancy.   MRI 09/24/20: Stable small nonenhancing lesion at the hepatic dome in segment 7 consistent with treated disease. No findings suspicious for recurrent tumor and no new early arterial phase enhancing hepatic lesions to suggest hepatoma or dysplastic nodule.  MRI 04/02/21: Stable post treatment changes in the right lobe of the liver, with no new hypervascular lesion to suggest new or recurrent hepatocellular carcinoma.   Crystal Barnett into the office today.  Overall, she is doing better than she has been in the past.  She is maintaining her independence and continues to drive.  She has been able to reduce the frequency of her paracentesis to once every 3-4 months with the assistance of diuretics.  She is currently on both Lasix and spironolactone which has been helpful.  Her labs look good.  She has no significant complaints at this time.  Past Medical History:  Diagnosis Date   ALLERGIC RHINITIS 01/12/2008   Allergy    Cancer ([place]  liver cancer hepatocellular carcinoma per pt    Cataract    bilateral   Colon polyps    hyperplastic   Constipation    ongoing issues per pt   Diabetes ([place])    Esophageal varices ([place])    Fatty liver    GOITER, MULTINODULAR 01/12/2008    Hepatic cirrhosis ([place])    History of radiation therapy 11/20/2018   x 1 radiation for liver cancer   Hypertension    under control   HYPOTHYROIDISM 11/29/9468   Lichen planus    In the mouth, Notes that she's had this for at least 20 years   Liver cirrhosis secondary to NASH ([place])    Osteoporosis    Portal venous hypertension ([place])    Splenomegaly    Thrombocytopenia ([place])    Appears chronic from at least 2014   Vitamin D deficiency     Past Surgical History:  Procedure Laterality Date   arthroscopic left knee  2005   BREAST BIOPSY Left 2017   BREAST EXCISIONAL BIOPSY Right 2008   BREAST SURGERY  2008   Breast Biopsy    CHOLECYSTECTOMY  1985   COLONOSCOPY     IR ANGIOGRAM SELECTIVE EACH ADDITIONAL VESSEL  11/03/2018   IR ANGIOGRAM SELECTIVE EACH ADDITIONAL VESSEL  11/03/2018   IR ANGIOGRAM SELECTIVE EACH ADDITIONAL VESSEL  11/03/2018   IR ANGIOGRAM SELECTIVE EACH ADDITIONAL VESSEL  11/03/2018   IR ANGIOGRAM SELECTIVE EACH ADDITIONAL VESSEL  11/20/2018   IR ANGIOGRAM SELECTIVE EACH ADDITIONAL VESSEL  11/20/2018   IR ANGIOGRAM SELECTIVE EACH ADDITIONAL VESSEL  11/20/2018   IR ANGIOGRAM VISCERAL SELECTIVE  11/03/2018   IR ANGIOGRAM VISCERAL SELECTIVE  11/03/2018   IR ANGIOGRAM VISCERAL SELECTIVE  11/03/2018   IR ANGIOGRAM VISCERAL SELECTIVE  11/20/2018   IR ANGIOGRAM VISCERAL SELECTIVE  11/20/2018   IR EMBO TUMOR ORGAN ISCHEMIA INFARCT INC GUIDE ROADMAPPING  11/20/2018   IR GENERIC HISTORICAL  04/02/2016   IR RADIOLOGIST EVAL & MGMT 04/02/2016 Jacqulynn Cadet, MD [facility] INTERV RAD   IR PARACENTESIS  02/02/2018   IR PARACENTESIS  04/20/2018   IR PARACENTESIS  12/03/2018   IR PARACENTESIS  07/15/2019   IR PARACENTESIS  02/25/2020   IR PARACENTESIS  06/22/2020   IR PARACENTESIS  08/08/2020   IR PARACENTESIS  09/18/2020   IR PARACENTESIS  12/06/2020   IR PARACENTESIS  03/23/2021   IR RADIOLOGIST EVAL & MGMT  10/29/2016   IR RADIOLOGIST EVAL & MGMT  04/29/2017   IR RADIOLOGIST EVAL & MGMT   11/04/2017   IR RADIOLOGIST EVAL & MGMT  04/16/2018   IR RADIOLOGIST EVAL & MGMT  12/08/2018   IR RADIOLOGIST EVAL & MGMT  03/16/2019   IR RADIOLOGIST EVAL & MGMT  07/08/2019   IR RADIOLOGIST EVAL & MGMT  10/07/2019   IR RADIOLOGIST EVAL & MGMT  01/05/2020   IR RADIOLOGIST EVAL & MGMT  03/23/2020   IR RADIOLOGIST EVAL & MGMT  09/26/2020   IR US GUIDE VASC ACCESS LEFT  11/20/2018   IR US GUIDE VASC ACCESS RIGHT  11/03/2018   POLYPECTOMY     UPPER GASTROINTESTINAL ENDOSCOPY      Allergies: Patient has no known allergies.  Medications: Prior to Admission medications   Medication Sig Start Date End Date Taking? Authorizing Provider  alendronate (FOSAMAX) 70 MG tablet Take 70 mg by mouth once a week. On Saturday Patient not taking: Reported on 11/30/2020 03/24/17   [provider]  Blood Glucose Monitoring Suppl ([place])  w/Device KIT OneTouch Verio System  USE TO TEST ONCE D UTD    [provider]  Calcium Carbonate-Vit D-Min (CALCIUM 1200 PO) Take 1,200 mg by mouth every other day. Patient not taking: Reported on 11/30/2020    [provider]  Cholecalciferol (VITAMIN D) 50 MCG (2000 UT) CAPS Take 2,000 Units by mouth at bedtime.    [provider]  fluticasone (FLONASE) 50 MCG/ACT nasal spray Place 1 spray into both nostrils daily.     [provider]  furosemide (LASIX) 40 MG tablet Take 1 tablet (40 mg total) by mouth daily. 05/01/20   Irene Shipper, MD  furosemide (LASIX) 40 MG tablet Take 1 tablet (40 mg total) by mouth daily. 06/07/20   Irene Shipper, MD  Lactobacillus (PROBIOTIC ACIDOPHILUS) CAPS Take 1 capsule by mouth 2 (two) times a week.     [provider]  levothyroxine (SYNTHROID) 50 MCG tablet TAKE 1 TABLET EVERY DAY 01/24/21   Renato Shin, MD  metFORMIN (GLUCOPHAGE) 500 MG tablet Take 500 mg by mouth every evening. Take 1 tablet by mouth once daily    [provider]  montelukast (SINGULAIR) 10 MG  tablet Take 10 mg by mouth at bedtime.  03/25/14   [provider]  Multiple Vitamin (MULTIVITAMIN) tablet Take 1 tablet by mouth at bedtime.     [provider]  pantoprazole (PROTONIX) 40 MG tablet Take 1 tablet (40 mg total) by mouth 2 (two) times daily. 05/01/20   Irene Shipper, MD  pantoprazole (PROTONIX) 40 MG tablet Take 1 tablet (40 mg total) by mouth 2 (two) times daily before a meal. 06/07/20   Irene Shipper, MD  polyethylene glycol (MIRALAX / GLYCOLAX) 17 g packet Take 17 g by mouth daily.    [provider]  spironolactone (ALDACTONE) 100 MG tablet Take 2 tablets (200 mg total) by mouth daily. 07/04/20   Irene Shipper, MD  vitamin C (ASCORBIC ACID) 500 MG tablet Take 500 mg by mouth every other day.    [provider]     Family History  Problem Relation Age of Onset   Goiter Maternal Grandmother    Stroke Father 69   Breast cancer Mother        in 41's   Colon cancer Neg Hx    Colon polyps Neg Hx    Esophageal cancer Neg Hx    Stomach cancer Neg Hx    Rectal cancer Neg Hx     Social History   Socioeconomic History   Marital status: Single    Spouse name: Not on file   Number of children: 1   Years of education: Not on file   Highest education level: Not on file  Occupational History   Occupation: retired    Comment: works in Chiropodist for [employer]  Tobacco Use   Smoking status: Former    Types: Cigarettes    Quit date: 05/13/1990    Years since quitting: 30.9   Smokeless tobacco: Never  Vaping Use   Vaping Use: Never used  Substance and Sexual Activity   Alcohol use: No    Alcohol/week: 0.0 standard drinks   Drug use: No   Sexual activity: Not on file  Other Topics Concern   Not on file  Social History Narrative   Not on file   Social Determinants of Health   Financial Resource Strain: Not on file  Food Insecurity: Not on file  Transportation Needs:  Not on file  Physical Activity: Not on file   Stress: Not on file  Social Connections: Not on file    ECOG Status: 0 - Asymptomatic  Review of Systems: A 12 point ROS discussed and pertinent positives are indicated in the HPI above.  All other systems are negative.  Review of Systems  Vital Signs: BP 121/61 (BP Location: Left Arm)   Pulse 74   SpO2 100%   Physical Exam Constitutional:      General: She is not in acute distress.    Appearance: Normal appearance.  HENT:     Head: Normocephalic and atraumatic.  Eyes:     General: No scleral icterus. Cardiovascular:     Rate and Rhythm: Normal rate and regular rhythm.  Pulmonary:     Effort: Pulmonary effort is normal.  Abdominal:     General: There is no distension.     Palpations: Abdomen is soft.     Tenderness: There is no abdominal tenderness.  Skin:    General: Skin is warm and dry.  Neurological:     Mental Status: She is alert and oriented to person, place, and time.  Psychiatric:        Mood and Affect: Mood normal.        Behavior: Behavior normal.      Imaging: MR ABDOMEN WWO CONTRAST  Result Date: 04/03/2021 CLINICAL DATA:  80 year old female with history of hepatocellular carcinoma. Follow-up study for surveillance following treatment. EXAM: MRI ABDOMEN WITHOUT AND WITH CONTRAST TECHNIQUE: Multiplanar multisequence MR imaging of the abdomen was performed both before and after the administration of intravenous contrast. CONTRAST:  67m GADAVIST GADOBUTROL 1 MMOL/ML IV SOLN COMPARISON:  Abdominal MRI 09/22/2020. FINDINGS: Lower chest: Unremarkable. Hepatobiliary: Liver has a shrunken appearance and nodular contour, indicative of advanced cirrhosis. No suspicious hypervascular lesions are noted. There is again a small area in the dome of the right lobe of the liver between segments 7 and 8 (axial image 13 of series 21) which is slightly T1 hypointense, occult on T2 weighted images, and demonstrates no internal enhancement, most compatible with a post  ablation defect. No other new hepatic lesions are noted. No intra or extrahepatic biliary ductal dilatation. Status post cholecystectomy. Pancreas: No pancreatic mass. No pancreatic ductal dilatation. No pancreatic or peripancreatic fluid collections or inflammatory changes. Spleen: Spleen appears mildly enlarged measuring 10.0 x 6.7 x 12.5 cm (estimated splenic volume of 419 mL). Adrenals/Urinary Tract: Bilateral kidneys and adrenal glands are normal in appearance. No hydroureteronephrosis in the visualized portions of the abdomen. Stomach/Bowel: Visualized portions are unremarkable. Vascular/Lymphatic: No aneurysm identified in the visualized abdominal vasculature. Portal vein is patent and dilated measuring 1.8 cm in diameter. No lymphadenopathy noted in the abdomen. Other: Fluid containing structure in the midline of the anterior abdominal wall imaged only on some coronal series and not visualized on the axial series, likely a ventral hernia. Moderate volume of ascites. Musculoskeletal: No aggressive appearing osseous lesions are noted in the visualized portions of the skeleton. IMPRESSION: 1. Stable post ablation changes in the right lobe of the liver, with no new hypervascular lesion to suggest new or recurrent hepatocellular carcinoma. 2. Cirrhosis with evidence of portal venous hypertension, including dilated portal vein and mild splenomegaly. 3. Moderate volume of ascites. 4. Probable ventral hernia, as discussed above. Electronically Signed   By: DVinnie LangtonM.D.   On: 04/03/2021 09:38   IR Paracentesis  Result Date: 03/23/2021 INDICATION: Recurrent ascites EXAM: ULTRASOUND GUIDED DIAGNOSTIC AND THERAPEUTIC  PARACENTESIS MEDICATIONS: None. COMPLICATIONS: None immediate. PROCEDURE: Informed written consent was obtained from the patient after a discussion of the risks, benefits and alternatives to treatment. A timeout was performed prior to the initiation of the procedure. Initial ultrasound  scanning demonstrates a moderate amount of ascites within the left lower abdominal quadrant. The left lower abdomen was prepped and draped in the usual sterile fashion. 1% lidocaine was used for local anesthesia. Following this, a 19 gauge, 7-cm, Yueh catheter was introduced. An ultrasound image was saved for documentation purposes. The paracentesis was performed. The catheter was removed and a dressing was applied. The patient tolerated the procedure well without immediate post procedural complication. FINDINGS: A total of approximately 3.8 L of cloudy yellow fluid was removed. Samples were sent to the laboratory as requested by the clinical team. IMPRESSION: Successful ultrasound-guided paracentesis yielding 3.8 liters of peritoneal fluid. Read and performed by: Alexandria Lodge, PA-C Electronically Signed   By: Ruthann Cancer M.D.   On: 03/23/2021 14:19    Labs:  CBC: Recent Labs    05/26/20 1815 11/30/20 0913 04/04/21 1143  WBC 3.2* 3.7* 3.1*  HGB 12.9 12.7 12.6  HCT 38.0 36.7 37.2  PLT 118* 136* 124*    COAGS: Recent Labs    04/04/21 1143  INR 1.0    BMP: Recent Labs    05/26/20 1815 06/16/20 1043 11/30/20 0913 12/06/20 1130 02/19/21 1011 04/04/21 1143  NA 133*   < > 137 132* 135 134*  K 4.5   < > 4.4 4.5 4.5 4.3  CL 99   < > 104 99 101 102  CO2 21*   < > 26 24 26 25   GLUCOSE 111*   < > 109* 71 84 95  BUN 16   < > 22 22 19 19   CALCIUM 9.6   < > 9.2 9.6 9.6 8.9  CREATININE 0.89   < > 0.97 0.94 0.98 0.84  GFRNONAA >60  --  59*  --   --  >60   < > = values in this interval not displayed.    LIVER FUNCTION TESTS: Recent Labs    11/30/20 0913 04/04/21 1143  BILITOT 0.8 1.0  AST 29 26  ALT 17 16  ALKPHOS 80 80  PROT 6.6 6.7  ALBUMIN 3.5 3.5    TUMOR MARKERS: No results for input(s): AFPTM, CEA, CA199, CHROMGRNA in the last 8760 hours.  Assessment and Plan:  80 year old female doing well status post radiation segmentectomy of hepatocellular carcinoma in  segment 7.  Most recent MR imaging demonstrates no evidence of recurrent or new disease.  She has now nearly 2.5 years disease-free.  We will continue routine surveillance.   Her ascites is better controlled now with diuretics.    1.)  Next follow-up MRI with gadolinium and accompanying clinic visit as well as liver labs in 6 months.    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

## 2021-04-19 ENCOUNTER — Ambulatory Visit: Payer: Medicare Other | Admitting: Podiatry

## 2021-04-19 DIAGNOSIS — Z20822 Contact with and (suspected) exposure to covid-19: Secondary | ICD-10-CM | POA: Diagnosis not present

## 2021-04-20 ENCOUNTER — Ambulatory Visit: Payer: Medicare Other

## 2021-04-20 DIAGNOSIS — Z20822 Contact with and (suspected) exposure to covid-19: Secondary | ICD-10-CM | POA: Diagnosis not present

## 2021-04-23 ENCOUNTER — Telehealth: Payer: Self-pay

## 2021-04-23 NOTE — Telephone Encounter (Signed)
-----   Message from Algernon Huxley, RN sent at 02/20/2021  8:56 AM EDT ----- Regarding: bmet Pt needs bmet, order in epic

## 2021-04-23 NOTE — Telephone Encounter (Signed)
Pt will come for labs tomorrow.

## 2021-04-24 ENCOUNTER — Other Ambulatory Visit (INDEPENDENT_AMBULATORY_CARE_PROVIDER_SITE_OTHER): Payer: Medicare Other

## 2021-04-24 ENCOUNTER — Other Ambulatory Visit: Payer: Self-pay

## 2021-04-24 DIAGNOSIS — R188 Other ascites: Secondary | ICD-10-CM

## 2021-04-24 DIAGNOSIS — K7460 Unspecified cirrhosis of liver: Secondary | ICD-10-CM

## 2021-04-24 LAB — BASIC METABOLIC PANEL
BUN: 22 mg/dL (ref 6–23)
CO2: 29 mEq/L (ref 19–32)
Calcium: 8.9 mg/dL (ref 8.4–10.5)
Chloride: 102 mEq/L (ref 96–112)
Creatinine, Ser: 1.02 mg/dL (ref 0.40–1.20)
GFR: 51.74 mL/min — ABNORMAL LOW (ref >60.00)
Glucose, Bld: 83 mg/dL (ref 70–99)
Potassium: 4.9 mEq/L (ref 3.5–5.1)
Sodium: 136 mEq/L (ref 135–145)

## 2021-05-02 ENCOUNTER — Other Ambulatory Visit: Payer: Self-pay

## 2021-05-02 ENCOUNTER — Ambulatory Visit (INDEPENDENT_AMBULATORY_CARE_PROVIDER_SITE_OTHER): Payer: Medicare Other | Admitting: Podiatry

## 2021-05-02 DIAGNOSIS — L989 Disorder of the skin and subcutaneous tissue, unspecified: Secondary | ICD-10-CM | POA: Diagnosis not present

## 2021-05-02 NOTE — Progress Notes (Signed)
HPI: 80 y.o. female presenting today as a new patient for evaluation of a symptomatic corn to the left fourth toe.  Patient states is very painful.  Normally she goes to the pedicure salon and has pedicures where they file it down.  Lately there is no been no relief and she presents for further treatment and evaluation  Past Medical History:  Diagnosis Date   ALLERGIC RHINITIS 01/12/2008   Allergy    Cancer ([place])    liver cancer hepatocellular carcinoma per pt    Cataract    bilateral   Colon polyps    hyperplastic   Constipation    ongoing issues per pt   Diabetes ([place])    Esophageal varices ([place])    Fatty liver    GOITER, MULTINODULAR 01/12/2008   Hepatic cirrhosis ([place])    History of radiation therapy 11/20/2018   x 1 radiation for liver cancer   Hypertension    under control   HYPOTHYROIDISM 6/55/3748   Lichen planus    In the mouth, Notes that she's had this for at least 20 years   Liver cirrhosis secondary to NASH ([place])    Osteoporosis    Portal venous hypertension ([place])    Splenomegaly    Thrombocytopenia ([place])    Appears chronic from at least 2014   Vitamin D deficiency     Past Surgical History:  Procedure Laterality Date   arthroscopic left knee  2005   BREAST BIOPSY Left 2017   BREAST EXCISIONAL BIOPSY Right 2008   BREAST SURGERY  2008   Breast Biopsy    CHOLECYSTECTOMY  1985   COLONOSCOPY     IR ANGIOGRAM SELECTIVE EACH ADDITIONAL VESSEL  11/03/2018   IR ANGIOGRAM SELECTIVE EACH ADDITIONAL VESSEL  11/03/2018   IR ANGIOGRAM SELECTIVE EACH ADDITIONAL VESSEL  11/03/2018   IR ANGIOGRAM SELECTIVE EACH ADDITIONAL VESSEL  11/03/2018   IR ANGIOGRAM SELECTIVE EACH ADDITIONAL VESSEL  11/20/2018   IR ANGIOGRAM SELECTIVE EACH ADDITIONAL VESSEL  11/20/2018   IR ANGIOGRAM SELECTIVE EACH ADDITIONAL VESSEL  11/20/2018   IR ANGIOGRAM VISCERAL SELECTIVE  11/03/2018   IR ANGIOGRAM VISCERAL SELECTIVE  11/03/2018   IR ANGIOGRAM VISCERAL SELECTIVE  11/03/2018   IR ANGIOGRAM VISCERAL  SELECTIVE  11/20/2018   IR ANGIOGRAM VISCERAL SELECTIVE  11/20/2018   IR EMBO TUMOR ORGAN ISCHEMIA INFARCT INC GUIDE ROADMAPPING  11/20/2018   IR GENERIC HISTORICAL  04/02/2016   IR RADIOLOGIST EVAL & MGMT 04/02/2016 Jacqulynn Cadet, MD [facility] INTERV RAD   IR PARACENTESIS  02/02/2018   IR PARACENTESIS  04/20/2018   IR PARACENTESIS  12/03/2018   IR PARACENTESIS  07/15/2019   IR PARACENTESIS  02/25/2020   IR PARACENTESIS  06/22/2020   IR PARACENTESIS  08/08/2020   IR PARACENTESIS  09/18/2020   IR PARACENTESIS  12/06/2020   IR PARACENTESIS  03/23/2021   IR RADIOLOGIST EVAL & MGMT  10/29/2016   IR RADIOLOGIST EVAL & MGMT  04/29/2017   IR RADIOLOGIST EVAL & MGMT  11/04/2017   IR RADIOLOGIST EVAL & MGMT  04/16/2018   IR RADIOLOGIST EVAL & MGMT  12/08/2018   IR RADIOLOGIST EVAL & MGMT  03/16/2019   IR RADIOLOGIST EVAL & MGMT  07/08/2019   IR RADIOLOGIST EVAL & MGMT  10/07/2019   IR RADIOLOGIST EVAL & MGMT  01/05/2020   IR RADIOLOGIST EVAL & MGMT  03/23/2020   IR RADIOLOGIST EVAL & MGMT  09/26/2020   IR RADIOLOGIST EVAL & MGMT  04/11/2021   IR US  GUIDE VASC ACCESS LEFT  11/20/2018   IR US GUIDE VASC ACCESS RIGHT  11/03/2018   POLYPECTOMY     UPPER GASTROINTESTINAL ENDOSCOPY      No Known Allergies   Physical Exam: General: The patient is alert and oriented x3 in no acute distress.  Dermatology: Hard symptomatic corn noted to the lateral aspect of the left fourth toe with a central nucleated core.  Vascular: Palpable pedal pulses bilaterally. Capillary refill within normal limits.  Negative for any significant edema or erythema  Neurological: Light touch and protective threshold grossly intact  Musculoskeletal Exam: No pedal deformities noted   Assessment: 1.  Symptomatic corn lateral aspect of the left fourth toe   Plan of Care:  1. Patient evaluated. .  2.  Patient stated that the corn was very symptomatic and tender to even light touch.  The left fourth toe was prepped and digital block  performed using 2% lidocaine plain totaling 3 mL in the area to allow for appropriate debridement of the: 3.  The symptomatic corn was debrided using a tissue nipper without incident or bleeding. 4.  Recommend OTC corn and callus remover as needed 5.  Return to clinic as needed      Edrick Kins, DPM [facility]  Dr. Edrick Kins, DPM    [address], [place] [address]                Office [phone]  Fax [phone]

## 2021-05-02 NOTE — Patient Instructions (Signed)
Corn and callus remover available at any pharmacy.

## 2021-05-04 ENCOUNTER — Ambulatory Visit: Payer: Medicare Other | Admitting: Internal Medicine

## 2021-05-18 ENCOUNTER — Encounter: Payer: Self-pay | Admitting: Endocrinology

## 2021-05-18 ENCOUNTER — Ambulatory Visit (INDEPENDENT_AMBULATORY_CARE_PROVIDER_SITE_OTHER): Payer: Medicare Other | Admitting: Endocrinology

## 2021-05-18 ENCOUNTER — Other Ambulatory Visit: Payer: Self-pay

## 2021-05-18 VITALS — BP 116/78 | HR 67 | Ht 64.5 in | Wt 177.0 lb

## 2021-05-18 DIAGNOSIS — E039 Hypothyroidism, unspecified: Secondary | ICD-10-CM

## 2021-05-18 LAB — TSH: TSH: 2.37 u[IU]/mL (ref 0.35–5.50)

## 2021-05-18 LAB — T4, FREE: Free T4: 1.4 ng/dL (ref 0.60–1.60)

## 2021-05-18 NOTE — Patient Instructions (Addendum)
Blood tests are requested for you today.  We'll let you know about the results.  Please come back for a follow-up appointment in 1 year.

## 2021-05-18 NOTE — Progress Notes (Signed)
Subjective:    Patient ID: Crystal Barnett, female    DOB: Sep 12, 1940, 81 y.o.   MRN: [id number]  HPI pt returns for f/u of multinodular goiter (dx'ed 2009, when bilat bxs were benign in 2009; the cytology did not show chronic thyroiditis, but she has chronic hypothyroidism; she has been on synthroid since 2009; f/u US in 2016 was unchanged; [place] in 2019 showed interval enlargement of the isthmus nodule; bx in 2019 showed [place] ([place] in 2021 was low risk)  She notices the goiter, but says there has been no change.  She takes synthroid as rx'ed.   Past Medical History:  Diagnosis Date   ALLERGIC RHINITIS 01/12/2008   Allergy    Cancer ([place])    liver cancer hepatocellular carcinoma per pt    Cataract    bilateral   Colon polyps    hyperplastic   Constipation    ongoing issues per pt   Diabetes ([place])    Esophageal varices ([place])    Fatty liver    GOITER, MULTINODULAR 01/12/2008   Hepatic cirrhosis ([place])    History of radiation therapy 11/20/2018   x 1 radiation for liver cancer   Hypertension    under control   HYPOTHYROIDISM 2/54/2706   Lichen planus    In the mouth, Notes that she's had this for at least 20 years   Liver cirrhosis secondary to NASH ([place])    Osteoporosis    Portal venous hypertension ([place])    Splenomegaly    Thrombocytopenia ([place])    Appears chronic from at least 2014   Vitamin D deficiency     Past Surgical History:  Procedure Laterality Date   arthroscopic left knee  2005   BREAST BIOPSY Left 2017   BREAST EXCISIONAL BIOPSY Right 2008   BREAST SURGERY  2008   Breast Biopsy    CHOLECYSTECTOMY  1985   COLONOSCOPY     IR ANGIOGRAM SELECTIVE EACH ADDITIONAL VESSEL  11/03/2018   IR ANGIOGRAM SELECTIVE EACH ADDITIONAL VESSEL  11/03/2018   IR ANGIOGRAM SELECTIVE EACH ADDITIONAL VESSEL  11/03/2018   IR ANGIOGRAM SELECTIVE EACH ADDITIONAL VESSEL  11/03/2018   IR ANGIOGRAM SELECTIVE EACH ADDITIONAL VESSEL  11/20/2018   IR  ANGIOGRAM SELECTIVE EACH ADDITIONAL VESSEL  11/20/2018   IR ANGIOGRAM SELECTIVE EACH ADDITIONAL VESSEL  11/20/2018   IR ANGIOGRAM VISCERAL SELECTIVE  11/03/2018   IR ANGIOGRAM VISCERAL SELECTIVE  11/03/2018   IR ANGIOGRAM VISCERAL SELECTIVE  11/03/2018   IR ANGIOGRAM VISCERAL SELECTIVE  11/20/2018   IR ANGIOGRAM VISCERAL SELECTIVE  11/20/2018   IR EMBO TUMOR ORGAN ISCHEMIA INFARCT INC GUIDE ROADMAPPING  11/20/2018   IR GENERIC HISTORICAL  04/02/2016   IR RADIOLOGIST EVAL & MGMT 04/02/2016 Jacqulynn Cadet, MD [facility] INTERV RAD   IR PARACENTESIS  02/02/2018   IR PARACENTESIS  04/20/2018   IR PARACENTESIS  12/03/2018   IR PARACENTESIS  07/15/2019   IR PARACENTESIS  02/25/2020   IR PARACENTESIS  06/22/2020   IR PARACENTESIS  08/08/2020   IR PARACENTESIS  09/18/2020   IR PARACENTESIS  12/06/2020   IR PARACENTESIS  03/23/2021   IR RADIOLOGIST EVAL & MGMT  10/29/2016   IR RADIOLOGIST EVAL & MGMT  04/29/2017   IR RADIOLOGIST EVAL & MGMT  11/04/2017   IR RADIOLOGIST EVAL & MGMT  04/16/2018   IR RADIOLOGIST EVAL & MGMT  12/08/2018   IR RADIOLOGIST EVAL & MGMT  03/16/2019   IR RADIOLOGIST EVAL & MGMT  07/08/2019   IR RADIOLOGIST EVAL & MGMT  10/07/2019   IR RADIOLOGIST EVAL & MGMT  01/05/2020   IR RADIOLOGIST EVAL & MGMT  03/23/2020   IR RADIOLOGIST EVAL & MGMT  09/26/2020   IR RADIOLOGIST EVAL & MGMT  04/11/2021   IR US GUIDE VASC ACCESS LEFT  11/20/2018   IR US GUIDE VASC ACCESS RIGHT  11/03/2018   POLYPECTOMY     UPPER GASTROINTESTINAL ENDOSCOPY      Social History   Socioeconomic History   Marital status: Single    Spouse name: Not on file   Number of children: 1   Years of education: Not on file   Highest education level: Not on file  Occupational History   Occupation: retired    Comment: works in Chiropodist for [employer]  Tobacco Use   Smoking status: Former    Types: Cigarettes    Quit date: 05/13/1990    Years since quitting: 31.0   Smokeless tobacco: Never  Vaping Use   Vaping  Use: Never used  Substance and Sexual Activity   Alcohol use: No    Alcohol/week: 0.0 standard drinks   Drug use: No   Sexual activity: Not on file  Other Topics Concern   Not on file  Social History Narrative   Not on file   Social Determinants of Health   Financial Resource Strain: Not on file  Food Insecurity: Not on file  Transportation Needs: Not on file  Physical Activity: Not on file  Stress: Not on file  Social Connections: Not on file  Intimate Partner Violence: Not on file    Current Outpatient Medications on File Prior to Visit  Medication Sig Dispense Refill   alendronate (FOSAMAX) 70 MG tablet Take 70 mg by mouth once a week. On Saturday     Blood Glucose Monitoring Suppl (ONETOUCH VERIO FLEX SYSTEM) w/Device KIT OneTouch Verio System  USE TO TEST ONCE D UTD     Calcium Carbonate-Vit D-Min (CALCIUM 1200 PO) Take 1,200 mg by mouth every other day.     Cholecalciferol (VITAMIN D) 50 MCG (2000 UT) CAPS Take 2,000 Units by mouth at bedtime.     furosemide (LASIX) 40 MG tablet Take 1 tablet (40 mg total) by mouth daily. 30 tablet 11   Lactobacillus (PROBIOTIC ACIDOPHILUS) CAPS Take 1 capsule by mouth 2 (two) times a week.      levothyroxine (SYNTHROID) 50 MCG tablet TAKE 1 TABLET EVERY DAY 90 tablet 0   metFORMIN (GLUCOPHAGE) 500 MG tablet Take 500 mg by mouth every evening. Take 1 tablet by mouth once daily     montelukast (SINGULAIR) 10 MG tablet Take 10 mg by mouth at bedtime.      Multiple Vitamin (MULTIVITAMIN) tablet Take 1 tablet by mouth at bedtime.      pantoprazole (PROTONIX) 40 MG tablet Take 1 tablet (40 mg total) by mouth 2 (two) times daily. 30 tablet 11   pantoprazole (PROTONIX) 40 MG tablet TAKE 1 TABLET TWICE DAILY BEFORE A MEAL 180 tablet 0   polyethylene glycol (MIRALAX / GLYCOLAX) 17 g packet Take 17 g by mouth daily.     spironolactone (ALDACTONE) 100 MG tablet Take 2 tablets (200 mg total) by mouth daily. 180 tablet 3   vitamin C (ASCORBIC ACID)  500 MG tablet Take 500 mg by mouth every other day.     Current Facility-Administered Medications on File Prior to Visit  Medication Dose Route Frequency Provider Last Rate Last  Admin   albumin human 25 % solution 12.5 g  12.5 g Intravenous Once Irene Shipper, MD        No Known Allergies  Family History  Problem Relation Age of Onset   Goiter Maternal Grandmother    Stroke Father 66   Breast cancer Mother        in 37's   Colon cancer Neg Hx    Colon polyps Neg Hx    Esophageal cancer Neg Hx    Stomach cancer Neg Hx    Rectal cancer Neg Hx     BP 116/78 (BP Location: Left Arm, Patient Position: Sitting, Cuff Size: Normal)    Pulse 67    Ht 5' 4.5" (1.638 m)    Wt 177 lb (80.3 kg)    SpO2 98%    BMI 29.91 kg/m    Review of Systems     Objective:   Physical Exam VITAL SIGNS:  See vs page GENERAL: no distress Neck: thyroid is approx 5 times normal size.  Multiple bilat nodules are again palpable.    Lab Results  Component Value Date   TSH 2.37 05/18/2021      Assessment & Plan:  MNG: low risk for malignancy.  I told pt all we need to do for this is annual PE.  Hypothyroidism: well-controlled.  Please continue the same synthroid Please come back for a follow-up appointment in 1 year.

## 2021-05-21 ENCOUNTER — Telehealth: Payer: Self-pay

## 2021-05-21 NOTE — Telephone Encounter (Signed)
LVM for pt regarding lab results.

## 2021-05-22 ENCOUNTER — Telehealth: Payer: Self-pay

## 2021-05-22 NOTE — Telephone Encounter (Signed)
Called and spoke with pt to let her know she needs a repeat BMET this week. Pt stated she would be able to come by this Friday 05/25/21.

## 2021-05-22 NOTE — Telephone Encounter (Signed)
-----   Message from Algernon Huxley, RN sent at 04/24/2021  1:32 PM EST ----- Regarding: BMET Pt needs repeat BMET

## 2021-05-25 ENCOUNTER — Other Ambulatory Visit (INDEPENDENT_AMBULATORY_CARE_PROVIDER_SITE_OTHER): Payer: Medicare Other

## 2021-05-25 ENCOUNTER — Ambulatory Visit: Payer: Medicare Other

## 2021-05-25 ENCOUNTER — Other Ambulatory Visit: Payer: Self-pay

## 2021-05-25 DIAGNOSIS — R188 Other ascites: Secondary | ICD-10-CM | POA: Diagnosis not present

## 2021-05-25 DIAGNOSIS — K7460 Unspecified cirrhosis of liver: Secondary | ICD-10-CM | POA: Diagnosis not present

## 2021-05-25 LAB — BASIC METABOLIC PANEL
BUN: 15 mg/dL (ref 6–23)
CO2: 28 mEq/L (ref 19–32)
Calcium: 8.9 mg/dL (ref 8.4–10.5)
Chloride: 102 mEq/L (ref 96–112)
Creatinine, Ser: 0.88 mg/dL (ref 0.40–1.20)
GFR: 61.73 mL/min (ref >60.00)
Glucose, Bld: 97 mg/dL (ref 70–99)
Potassium: 4.1 mEq/L (ref 3.5–5.1)
Sodium: 137 mEq/L (ref 135–145)

## 2021-06-04 ENCOUNTER — Ambulatory Visit: Payer: Medicare Other

## 2021-06-12 ENCOUNTER — Telehealth: Payer: Self-pay | Admitting: Internal Medicine

## 2021-06-12 ENCOUNTER — Ambulatory Visit (INDEPENDENT_AMBULATORY_CARE_PROVIDER_SITE_OTHER): Payer: Medicare Other | Admitting: Internal Medicine

## 2021-06-12 ENCOUNTER — Encounter: Payer: Self-pay | Admitting: Internal Medicine

## 2021-06-12 VITALS — BP 110/64 | HR 76 | Ht 64.5 in | Wt 169.1 lb

## 2021-06-12 DIAGNOSIS — K7460 Unspecified cirrhosis of liver: Secondary | ICD-10-CM | POA: Diagnosis not present

## 2021-06-12 DIAGNOSIS — R932 Abnormal findings on diagnostic imaging of liver and biliary tract: Secondary | ICD-10-CM

## 2021-06-12 DIAGNOSIS — K259 Gastric ulcer, unspecified as acute or chronic, without hemorrhage or perforation: Secondary | ICD-10-CM

## 2021-06-12 DIAGNOSIS — K429 Umbilical hernia without obstruction or gangrene: Secondary | ICD-10-CM | POA: Diagnosis not present

## 2021-06-12 DIAGNOSIS — K7581 Nonalcoholic steatohepatitis (NASH): Secondary | ICD-10-CM

## 2021-06-12 MED ORDER — FUROSEMIDE 40 MG PO TABS: 40.0000 mg | ORAL_TABLET | Freq: Every day | ORAL | 3 refills | Status: DC

## 2021-06-12 MED ORDER — SPIRONOLACTONE 100 MG PO TABS: 200.0000 mg | ORAL_TABLET | Freq: Every day | ORAL | 3 refills | Status: AC

## 2021-06-12 MED ORDER — PANTOPRAZOLE SODIUM 40 MG PO TBEC: 40.0000 mg | DELAYED_RELEASE_TABLET | Freq: Two times a day (BID) | ORAL | 3 refills | Status: DC

## 2021-06-12 MED ORDER — FUROSEMIDE 40 MG PO TABS: 40.0000 mg | ORAL_TABLET | Freq: Every day | ORAL | 3 refills | Status: AC

## 2021-06-12 MED ORDER — PANTOPRAZOLE SODIUM 40 MG PO TBEC: 40.0000 mg | DELAYED_RELEASE_TABLET | Freq: Two times a day (BID) | ORAL | 3 refills | Status: AC

## 2021-06-12 MED ORDER — SPIRONOLACTONE 100 MG PO TABS: 200.0000 mg | ORAL_TABLET | Freq: Every day | ORAL | 3 refills | Status: DC

## 2021-06-12 NOTE — Patient Instructions (Signed)
If you are age 82 or older, your body mass index should be between 23-30. Your Body mass index is 28.58 kg/m. If this is out of the aforementioned range listed, please consider follow up with your Primary Care Provider.  If you are age 40 or younger, your body mass index should be between 19-25. Your Body mass index is 28.58 kg/m. If this is out of the aformentioned range listed, please consider follow up with your Primary Care Provider.   ________________________________________________________  The [facility] providers would like to encourage you to use [facility] to communicate with providers for non-urgent requests or questions.  Due to long hold times on the telephone, sending your provider a message by [facility] may be a faster and more efficient way to get a response.  Please allow 48 business hours for a response.  Please remember that this is for non-urgent requests.  _______________________________________________________  We have sent the following medications to your pharmacy for you to pick up at your convenience:  Lasix, Aldactone, Protonix.  Please follow up in one year

## 2021-06-12 NOTE — Telephone Encounter (Signed)
Medication resent to [facility]

## 2021-06-12 NOTE — Telephone Encounter (Signed)
Patient called states her medications were sent to the incorrect pharmacy it should be sent to [facility] mail order.

## 2021-06-12 NOTE — Progress Notes (Signed)
HISTORY OF PRESENT ILLNESS:  Crystal Barnett is a 81 y.o. female with the following:  1.  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  On diuretics.  Infrequent paracentesis.  Last paracentesis March 23, 2021 (3.8 L removed).  Current MELD score 6 2.  Indeterminate liver lesion with normal AFP status post radio embolization therapy July 2020 for possible HCC.  No evidence for malignant lesions on MRI of the liver March 20, 2020.  Followed by IR.  Most recent MRI April 02, 2021 revealing stable post ablation changes in the right lobe of the liver with no new lesions or evidence of recurrence. 3.  Symptomatic large umbilical hernia.  She wears an abdominal corset.  Evaluated by Dr. Greer Pickerel March 22, 2020.  He is not keen on operating and will solicit opinion to the point of his partners with hepatobiliary expertise.  Discouraged the patient from surgery due to quoted high operative mortality 4.  Morbid obesity 5.  History of gastric ulceration associated with Helicobacter pylori.  Improved on PPI after H. pylori eradication therapy-on follow-up endoscopy (November 2020).  NO VARICES. 6.  GERD.  Symptoms controlled on PPI 7.  General medical problems 8.  Prior COVID infection 9.  Colonoscopy elsewhere January 2008.  Negative for neoplasia.  Normal exam.  The patient was last seen in the office May 01, 2020.  See that dictation for details.  At that time she was clinically stable.  She tells me that she is currently taking Aldactone 200 mg daily and Lasix 40 mg daily.  Recent basic metabolic panel was normal.  She also continues on pantoprazole twice daily due to her history of ulcer disease.  She continues to wear a corset to manage her large umbilical hernia.  Her weight is down 7 pounds since her last visit.  She does complain of fatigue and somnolence and difficulty with her balance for which she uses a cane.  She had to stop walking her dog.  No change in bowel  habits or GI bleeding.  Nothing to suggest encephalopathy.  REVIEW OF SYSTEMS:  All non-GI ROS negative unless otherwise stated in the HPI except for hearing difficulties, urinary leakage, sinus and allergy  Past Medical History:  Diagnosis Date   ALLERGIC RHINITIS 01/12/2008   Allergy    Cancer ([place])    liver cancer hepatocellular carcinoma per pt    Cataract    bilateral   Colon polyps    hyperplastic   Constipation    ongoing issues per pt   Diabetes ([place])    Esophageal varices ([place])    Fatty liver    GOITER, MULTINODULAR 01/12/2008   Hepatic cirrhosis ([place])    History of radiation therapy 11/20/2018   x 1 radiation for liver cancer   Hypertension    under control   HYPOTHYROIDISM 09/13/5463   Lichen planus    In the mouth, Notes that she's had this for at least 20 years   Liver cirrhosis secondary to NASH ([place])    Osteoporosis    Portal venous hypertension ([place])    Splenomegaly    Thrombocytopenia ([place])    Appears chronic from at least 2014   Vitamin D deficiency     Past Surgical History:  Procedure Laterality Date   arthroscopic left knee  2005   BREAST BIOPSY Left 2017   BREAST EXCISIONAL BIOPSY Right 2008   BREAST SURGERY  2008   Breast Biopsy    CHOLECYSTECTOMY  1985  COLONOSCOPY     IR ANGIOGRAM SELECTIVE EACH ADDITIONAL VESSEL  11/03/2018   IR ANGIOGRAM SELECTIVE EACH ADDITIONAL VESSEL  11/03/2018   IR ANGIOGRAM SELECTIVE EACH ADDITIONAL VESSEL  11/03/2018   IR ANGIOGRAM SELECTIVE EACH ADDITIONAL VESSEL  11/03/2018   IR ANGIOGRAM SELECTIVE EACH ADDITIONAL VESSEL  11/20/2018   IR ANGIOGRAM SELECTIVE EACH ADDITIONAL VESSEL  11/20/2018   IR ANGIOGRAM SELECTIVE EACH ADDITIONAL VESSEL  11/20/2018   IR ANGIOGRAM VISCERAL SELECTIVE  11/03/2018   IR ANGIOGRAM VISCERAL SELECTIVE  11/03/2018   IR ANGIOGRAM VISCERAL SELECTIVE  11/03/2018   IR ANGIOGRAM VISCERAL SELECTIVE  11/20/2018   IR ANGIOGRAM VISCERAL SELECTIVE  11/20/2018   IR EMBO TUMOR ORGAN ISCHEMIA INFARCT INC  GUIDE ROADMAPPING  11/20/2018   IR GENERIC HISTORICAL  04/02/2016   IR RADIOLOGIST EVAL & MGMT 04/02/2016 Jacqulynn Cadet, MD [facility] INTERV RAD   IR PARACENTESIS  02/02/2018   IR PARACENTESIS  04/20/2018   IR PARACENTESIS  12/03/2018   IR PARACENTESIS  07/15/2019   IR PARACENTESIS  02/25/2020   IR PARACENTESIS  06/22/2020   IR PARACENTESIS  08/08/2020   IR PARACENTESIS  09/18/2020   IR PARACENTESIS  12/06/2020   IR PARACENTESIS  03/23/2021   IR RADIOLOGIST EVAL & MGMT  10/29/2016   IR RADIOLOGIST EVAL & MGMT  04/29/2017   IR RADIOLOGIST EVAL & MGMT  11/04/2017   IR RADIOLOGIST EVAL & MGMT  04/16/2018   IR RADIOLOGIST EVAL & MGMT  12/08/2018   IR RADIOLOGIST EVAL & MGMT  03/16/2019   IR RADIOLOGIST EVAL & MGMT  07/08/2019   IR RADIOLOGIST EVAL & MGMT  10/07/2019   IR RADIOLOGIST EVAL & MGMT  01/05/2020   IR RADIOLOGIST EVAL & MGMT  03/23/2020   IR RADIOLOGIST EVAL & MGMT  09/26/2020   IR RADIOLOGIST EVAL & MGMT  04/11/2021   IR US GUIDE VASC ACCESS LEFT  11/20/2018   IR US GUIDE VASC ACCESS RIGHT  11/03/2018   POLYPECTOMY     UPPER GASTROINTESTINAL ENDOSCOPY      Social History Crystal Barnett  reports that she quit smoking about 31 years ago. Her smoking use included cigarettes. She has never used smokeless tobacco. She reports that she does not drink alcohol and does not use drugs.  family history includes Breast cancer in her mother; Goiter in her maternal grandmother; Stroke (age of onset: 93) in her father.  No Known Allergies     PHYSICAL EXAMINATION: Vital signs: BP 110/64    Pulse 76    Ht 5' 4.5" (1.638 m)    Wt 169 lb 2 oz (76.7 kg)    SpO2 99%    BMI 28.58 kg/m   Constitutional: Chronically ill-appearing female, thinning hair, no acute distress Psychiatric: alert and oriented x3, cooperative Eyes: extraocular movements intact, anicteric, conjunctiva pink Mouth: Mask Neck: supple no lymphadenopathy Cardiovascular: heart regular rate and rhythm, no murmur Lungs: clear to  auscultation bilaterally Abdomen: soft, nontender, nondistended, no obvious ascites, no peritoneal signs, normal bowel sounds, no organomegaly.  Large umbilical hernia that manually reduces.  Uncomfortable but Rectal: Omitted Extremities: no clubbing or cyanosis.  1+ lower extremity edema bilaterally with chronic stasis changes Skin: no additional lesions on visible extremities Neuro: No focal deficits. No asterixis.   IMPRESSION:  1.  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  On diuretics.  Infrequent paracentesis.  Last paracentesis March 23, 2021 (3.8 L removed).  Current MELD score 6 2.  Indeterminate liver lesion with normal  AFP status post radio embolization therapy July 2020 for possible HCC.  No evidence for malignant lesions on MRI of the liver March 20, 2020.  Followed by IR.  Most recent MRI April 02, 2021 revealing stable post ablation changes in the right lobe of the liver with no new lesions or evidence of recurrence. 3.  Symptomatic large umbilical hernia.  She wears an abdominal corset.  Evaluated by Dr. Greer Pickerel March 22, 2020.  He is not keen on operating and will solicit opinion to the point of his partners with hepatobiliary expertise.  Discouraged the patient from surgery due to quoted high operative mortality 4.  Morbid obesity 5.  History of gastric ulceration associated with Helicobacter pylori.  Improved on PPI after H. pylori eradication therapy-on follow-up endoscopy (November 2020).  NO VARICES. 6.  GERD.  Symptoms controlled on PPI 7.  General medical problems 8.  Prior COVID infection 9.  Colonoscopy elsewhere January 2008.  Negative for neoplasia.  Normal exam.  PLAN: 1.  Continue Aldactone 200 mg daily.  Prescription refilled 2.  Continue Lasix 40 mg daily.  Prescription refilled 3.  Continue pantoprazole twice daily.  Prescription refilled 4.  2 g sodium diet. 5.  Continue to wear abdominal corset 6.  Paracentesis as  needed 7.  Periodic laboratory monitoring as directed. 8.  Office follow-up 1 year A total time of 45 minutes was spent preparing to see the patient, reviewing test and x-rays, obtaining comprehensive history and performing comprehensive physical examination.  Counseling the patient regarding her multiple above listed issues, ordering medications and follow-up laboratories.  Finally, documenting clinical information in the health record

## 2021-06-25 ENCOUNTER — Other Ambulatory Visit: Payer: Self-pay | Admitting: Endocrinology

## 2021-07-24 ENCOUNTER — Other Ambulatory Visit (INDEPENDENT_AMBULATORY_CARE_PROVIDER_SITE_OTHER): Payer: Medicare Other

## 2021-07-24 ENCOUNTER — Telehealth: Payer: Self-pay

## 2021-07-24 DIAGNOSIS — R188 Other ascites: Secondary | ICD-10-CM

## 2021-07-24 DIAGNOSIS — K7460 Unspecified cirrhosis of liver: Secondary | ICD-10-CM | POA: Diagnosis not present

## 2021-07-24 LAB — BASIC METABOLIC PANEL
BUN: 16 mg/dL (ref 6–23)
CO2: 26 mEq/L (ref 19–32)
Calcium: 9 mg/dL (ref 8.4–10.5)
Chloride: 101 mEq/L (ref 96–112)
Creatinine, Ser: 0.81 mg/dL (ref 0.40–1.20)
GFR: 68.11 mL/min (ref >60.00)
Glucose, Bld: 99 mg/dL (ref 70–99)
Potassium: 4.3 mEq/L (ref 3.5–5.1)
Sodium: 134 mEq/L — ABNORMAL LOW (ref 135–145)

## 2021-07-24 NOTE — Telephone Encounter (Signed)
Reminder call sent to pt for repeat labs. ?

## 2021-07-24 NOTE — Telephone Encounter (Signed)
-----   Message from Algernon Huxley, RN sent at 05/25/2021  3:57 PM EST ----- ?Regarding: Lab ?Pt needs labs in 2 months, orders in epic. ? ?

## 2021-08-27 DIAGNOSIS — Z20822 Contact with and (suspected) exposure to covid-19: Secondary | ICD-10-CM | POA: Diagnosis not present

## 2021-08-31 ENCOUNTER — Telehealth: Payer: Self-pay | Admitting: Internal Medicine

## 2021-08-31 NOTE — Telephone Encounter (Signed)
Pt calling and requesting a paracentesis next week. States it has been a while and she thinks it is time to have one. Please advise. ?

## 2021-08-31 NOTE — Telephone Encounter (Signed)
Inbound call from patient stating that she believes that she may need to have a parenthesis done. Patient is requesting a call back to discuss. Please advise.  ?

## 2021-08-31 NOTE — Telephone Encounter (Signed)
Please arrange. Up to 8 liters, IV albumin replacement, fluid for cell count with diff.  ?

## 2021-09-03 ENCOUNTER — Other Ambulatory Visit: Payer: Self-pay

## 2021-09-03 DIAGNOSIS — R188 Other ascites: Secondary | ICD-10-CM

## 2021-09-03 NOTE — Addendum Note (Signed)
Addended by: Rosanne Sack R on: 09/03/2021 08:53 AM ? ? Modules accepted: Orders ? ?

## 2021-09-03 NOTE — Telephone Encounter (Signed)
Patient is returning your call.  

## 2021-09-03 NOTE — Telephone Encounter (Signed)
Spoke with pt and she cannot come Tuesday due to another appt. Pt rescheduled to 09/05/21 at 10am, pt to arrive there at 9:30am. Pt aware of appt. ?

## 2021-09-03 NOTE — Telephone Encounter (Signed)
Pt scheduled for IR para at [facility] 09/04/21 at 9am, pt to arrive there at 8:30am. Left message for pt to call back. ?

## 2021-09-03 NOTE — Addendum Note (Signed)
Addended by: Rosanne Sack R on: 09/03/2021 08:55 AM ? ? Modules accepted: Orders ? ?

## 2021-09-04 ENCOUNTER — Ambulatory Visit (HOSPITAL_COMMUNITY): Payer: Medicare Other

## 2021-09-04 ENCOUNTER — Other Ambulatory Visit: Payer: Self-pay

## 2021-09-04 ENCOUNTER — Ambulatory Visit (INDEPENDENT_AMBULATORY_CARE_PROVIDER_SITE_OTHER): Payer: Medicare Other | Admitting: Endocrinology

## 2021-09-04 ENCOUNTER — Telehealth: Payer: Self-pay | Admitting: Endocrinology

## 2021-09-04 VITALS — BP 132/70 | HR 77 | Ht 64.5 in | Wt 166.8 lb

## 2021-09-04 DIAGNOSIS — E039 Hypothyroidism, unspecified: Secondary | ICD-10-CM | POA: Diagnosis not present

## 2021-09-04 MED ORDER — LEVOTHYROXINE SODIUM 50 MCG PO TABS: 50.0000 ug | ORAL_TABLET | Freq: Every day | ORAL | 1 refills | Status: AC

## 2021-09-04 MED ORDER — LEVOTHYROXINE SODIUM 50 MCG PO TABS: 50.0000 ug | ORAL_TABLET | Freq: Every day | ORAL | 1 refills | Status: DC

## 2021-09-04 NOTE — Telephone Encounter (Signed)
Patient stopped at front desk on her way out to advise that the RX for Levothyroxine should be sent to the [facility] and not [facility] ? ?Call back [phone] ?

## 2021-09-04 NOTE — Progress Notes (Signed)
? ?Subjective:  ? ? Patient ID: Crystal Barnett, female    DOB: 14-Feb-1941, 81 y.o.   MRN: [id number] ? ?HPI ?pt returns for f/u of multinodular goiter/hypothyroidism (both dx'ed 2009, bilat bxs were benign in 2009; the cytology did not show chronic thyroiditis, but she has chronic hypothyroidism; f/u US in 2016 was unchanged; [place] in 2019 showed interval enlargement of the isthmus nodule; bx in 2019 was cat 2; [place] in 2021 was low risk)  She notices the goiter, but says there has been no change.  She takes synthroid as rx'ed.   ?Past Medical History:  ?Diagnosis Date  ? ALLERGIC RHINITIS 01/12/2008  ? Allergy   ? Cancer [facility])   ? liver cancer hepatocellular carcinoma per pt   ? Cataract   ? bilateral  ? Colon polyps   ? hyperplastic  ? Constipation   ? ongoing issues per pt  ? Diabetes ([place])   ? Esophageal varices (HCC)   ? Fatty liver   ? GOITER, MULTINODULAR 01/12/2008  ? Hepatic cirrhosis ([place])   ? History of radiation therapy 11/20/2018  ? x 1 radiation for liver cancer  ? Hypertension   ? under control  ? HYPOTHYROIDISM 09/07/2008  ? Lichen planus   ? In the mouth, Notes that she's had this for at least 20 years  ? Liver cirrhosis secondary to NASH [facility])   ? Osteoporosis   ? Portal venous hypertension (HCC)   ? Splenomegaly   ? Thrombocytopenia ([place])   ? Appears chronic from at least 2014  ? Vitamin D deficiency   ? ? ?Past Surgical History:  ?Procedure Laterality Date  ? arthroscopic left knee  2005  ? BREAST BIOPSY Left 2017  ? BREAST EXCISIONAL BIOPSY Right 2008  ? BREAST SURGERY  2008  ? Breast Biopsy   ? CHOLECYSTECTOMY  1985  ? COLONOSCOPY    ? IR ANGIOGRAM SELECTIVE EACH ADDITIONAL VESSEL  11/03/2018  ? IR ANGIOGRAM SELECTIVE EACH ADDITIONAL VESSEL  11/03/2018  ? IR ANGIOGRAM SELECTIVE EACH ADDITIONAL VESSEL  11/03/2018  ? IR ANGIOGRAM SELECTIVE EACH ADDITIONAL VESSEL  11/03/2018  ? IR ANGIOGRAM SELECTIVE EACH ADDITIONAL VESSEL  11/20/2018  ? IR ANGIOGRAM SELECTIVE EACH ADDITIONAL VESSEL  11/20/2018  ? IR ANGIOGRAM  SELECTIVE EACH ADDITIONAL VESSEL  11/20/2018  ? IR ANGIOGRAM VISCERAL SELECTIVE  11/03/2018  ? IR ANGIOGRAM VISCERAL SELECTIVE  11/03/2018  ? IR ANGIOGRAM VISCERAL SELECTIVE  11/03/2018  ? IR ANGIOGRAM VISCERAL SELECTIVE  11/20/2018  ? IR ANGIOGRAM VISCERAL SELECTIVE  11/20/2018  ? IR EMBO TUMOR ORGAN ISCHEMIA INFARCT INC GUIDE ROADMAPPING  11/20/2018  ? IR GENERIC HISTORICAL  04/02/2016  ? IR RADIOLOGIST EVAL & MGMT 04/02/2016 Jacqulynn Cadet, MD [facility] INTERV RAD  ? IR PARACENTESIS  02/02/2018  ? IR PARACENTESIS  04/20/2018  ? IR PARACENTESIS  12/03/2018  ? IR PARACENTESIS  07/15/2019  ? IR PARACENTESIS  02/25/2020  ? IR PARACENTESIS  06/22/2020  ? IR PARACENTESIS  08/08/2020  ? IR PARACENTESIS  09/18/2020  ? IR PARACENTESIS  12/06/2020  ? IR PARACENTESIS  03/23/2021  ? IR PARACENTESIS  09/05/2021  ? IR RADIOLOGIST EVAL & MGMT  10/29/2016  ? IR RADIOLOGIST EVAL & MGMT  04/29/2017  ? IR RADIOLOGIST EVAL & MGMT  11/04/2017  ? IR RADIOLOGIST EVAL & MGMT  04/16/2018  ? IR RADIOLOGIST EVAL & MGMT  12/08/2018  ? IR RADIOLOGIST EVAL & MGMT  03/16/2019  ? IR RADIOLOGIST EVAL & MGMT  07/08/2019  ? IR RADIOLOGIST  EVAL & MGMT  10/07/2019  ? IR RADIOLOGIST EVAL & MGMT  01/05/2020  ? IR RADIOLOGIST EVAL & MGMT  03/23/2020  ? IR RADIOLOGIST EVAL & MGMT  09/26/2020  ? IR RADIOLOGIST EVAL & MGMT  04/11/2021  ? IR US GUIDE VASC ACCESS LEFT  11/20/2018  ? IR US GUIDE VASC ACCESS RIGHT  11/03/2018  ? POLYPECTOMY    ? UPPER GASTROINTESTINAL ENDOSCOPY    ? ? ?Social History  ? ?Socioeconomic History  ? Marital status: Single  ?  Spouse name: Not on file  ? Number of children: 1  ? Years of education: Not on file  ? Highest education level: Not on file  ?Occupational History  ? Occupation: retired  ?  Comment: works in Chiropodist for [employer]  ?Tobacco Use  ? Smoking status: Former  ?  Types: Cigarettes  ?  Quit date: 05/13/1990  ?  Years since quitting: 31.3  ? Smokeless tobacco: Never  ?Vaping Use  ? Vaping Use: Never used  ?Substance and Sexual  Activity  ? Alcohol use: No  ?  Alcohol/week: 0.0 standard drinks  ? Drug use: No  ? Sexual activity: Not on file  ?Other Topics Concern  ? Not on file  ?Social History Narrative  ? Not on file  ? ?Social Determinants of Health  ? ?Financial Resource Strain: Not on file  ?Food Insecurity: Not on file  ?Transportation Needs: Not on file  ?Physical Activity: Not on file  ?Stress: Not on file  ?Social Connections: Not on file  ?Intimate Partner Violence: Not on file  ? ? ?Current Outpatient Medications on File Prior to Visit  ?Medication Sig Dispense Refill  ? Cholecalciferol (VITAMIN D) 50 MCG (2000 UT) CAPS Take 2,000 Units by mouth at bedtime.    ? furosemide (LASIX) 40 MG tablet Take 1 tablet (40 mg total) by mouth daily. 90 tablet 3  ? metFORMIN (GLUCOPHAGE) 500 MG tablet Take 500 mg by mouth every evening. Take 1 tablet by mouth once daily    ? montelukast (SINGULAIR) 10 MG tablet Take 10 mg by mouth at bedtime.     ? Multiple Vitamin (MULTIVITAMIN) tablet Take 1 tablet by mouth at bedtime.     ? pantoprazole (PROTONIX) 40 MG tablet Take 1 tablet (40 mg total) by mouth 2 (two) times daily. 180 tablet 3  ? polyethylene glycol (MIRALAX / GLYCOLAX) 17 g packet Take 17 g by mouth daily.    ? spironolactone (ALDACTONE) 100 MG tablet Take 2 tablets (200 mg total) by mouth daily. 180 tablet 3  ? vitamin C (ASCORBIC ACID) 500 MG tablet Take 500 mg by mouth daily.    ? ?Current Facility-Administered Medications on File Prior to Visit  ?Medication Dose Route Frequency Provider Last Rate Last Admin  ? albumin human 25 % solution 12.5 g  12.5 g Intravenous Once Irene Shipper, MD      ? ? ?No Known Allergies ? ?Family History  ?Problem Relation Age of Onset  ? Breast cancer Mother   ?     in 74's  ? Stroke Father 57  ? Goiter Maternal Grandmother   ? Colon cancer Neg Hx   ? Colon polyps Neg Hx   ? Esophageal cancer Neg Hx   ? Stomach cancer Neg Hx   ? Rectal cancer Neg Hx   ? Pancreatic cancer Neg Hx   ? ? ?BP 132/70 (BP  Location: Left Arm, Patient Position: Sitting, Cuff Size: Normal)  Pulse 77   Ht 5' 4.5" (1.638 m)   Wt 166 lb 12.8 oz (75.7 kg)   SpO2 98%   BMI 28.19 kg/m?  ? ? ?Review of Systems ? ?   ?Objective:  ? Physical Exam ?VITAL SIGNS:  See vs page ?GENERAL: no distress ?Neck: thyroid is approx 5 times normal size, with MNG surface.   ? ?Lab Results  ?Component Value Date  ? TSH 2.37 05/18/2021  ? ?   ?Assessment & Plan:  ?Hypothyroidism: well-controlled ?MNG: we discussed low risk of cancer.   ? ?Patient Instructions  ?You don't need any more thyroid ultrasounds, unless a new need arises, which is unlikely.    ?Please continue the same levothyroxine.   ?It is best to never miss the medication.  However, if you do miss it, next best is to double up the next time.   ?Dr Jacelyn Grip would be happy to check your blood test each year, and refill your levothyroxine.   ? ? ?

## 2021-09-04 NOTE — Telephone Encounter (Signed)
RX for Levothyroxine has now been sent to [facility] ?

## 2021-09-04 NOTE — Patient Instructions (Addendum)
You don't need any more thyroid ultrasounds, unless a new need arises, which is unlikely.    ?Please continue the same levothyroxine.   ?It is best to never miss the medication.  However, if you do miss it, next best is to double up the next time.   ?Dr Jacelyn Grip would be happy to check your blood test each year, and refill your levothyroxine.   ?

## 2021-09-05 ENCOUNTER — Ambulatory Visit (HOSPITAL_COMMUNITY)
Admission: RE | Admit: 2021-09-05 | Discharge: 2021-09-05 | Disposition: A | Discharge status: Home / Self Care | Payer: Medicare Other | Source: Ambulatory Visit | Attending: Internal Medicine | Admitting: Internal Medicine

## 2021-09-05 ENCOUNTER — Other Ambulatory Visit: Payer: Self-pay | Admitting: Interventional Radiology

## 2021-09-05 DIAGNOSIS — R188 Other ascites: Secondary | ICD-10-CM | POA: Insufficient documentation

## 2021-09-05 DIAGNOSIS — C220 Liver cell carcinoma: Secondary | ICD-10-CM

## 2021-09-05 HISTORY — PX: IR PARACENTESIS: IMG2679

## 2021-09-05 LAB — BODY FLUID CELL COUNT WITH DIFFERENTIAL
Eos, Fluid: 0 %
Lymphs, Fluid: 75 %
Monocyte-Macrophage-Serous Fluid: 20 % — ABNORMAL LOW (ref 50–90)
Neutrophil Count, Fluid: 5 % (ref 0–25)
Total Nucleated Cell Count, Fluid: 249 cu mm (ref 0–1000)

## 2021-09-05 MED ORDER — LIDOCAINE HCL 1 % IJ SOLN
INTRAMUSCULAR | Status: AC
  Filled 2021-09-05: qty 20

## 2021-09-05 MED ORDER — LIDOCAINE HCL (PF) 1 % IJ SOLN
INTRAMUSCULAR | Status: DC | PRN
  Administered 2021-09-05: 10 mL

## 2021-09-05 NOTE — Procedures (Signed)
PROCEDURE SUMMARY: ? ?Successful US guided paracentesis from left lateral abdomen.  ?Yielded 1 liter of cloudy yellow fluid.  ?No immediate complications.  ?Patient tolerated well.  ?EBL = trace ? ?Specimen was sent for labs. ? ?Murrell Redden PA-C ?09/05/2021 ?11:13 AM ? ? ? ?

## 2021-09-06 ENCOUNTER — Other Ambulatory Visit: Payer: Self-pay

## 2021-09-06 DIAGNOSIS — R188 Other ascites: Secondary | ICD-10-CM

## 2021-09-06 LAB — PATHOLOGIST SMEAR REVIEW

## 2021-09-06 NOTE — Telephone Encounter (Signed)
Pt calling and states she had IR para done yesterday and they only drew off 1 liter of fluid. She reports that her belly feels full of fluid, she does not understand why she is feeling this way. She states she is wearing the abd binder all the time except when she showers. She wanted to know if the labs were back from the fluid that was drawn off and I let her know it is still in process. Pt concerned and wants to know if Dr. Henrene Pastor has any recommendations. Please advise. ?

## 2021-09-06 NOTE — Telephone Encounter (Signed)
Orders entered as requested. Pt scheduled for another IR para at [facility] 09/10/21 at 1pm, pt to arrive there at 12:30pm. Left message for pt to call back. ?

## 2021-09-06 NOTE — Telephone Encounter (Signed)
As you know Vaughan Basta, I was in dialogue with radiology regarding this.  They stated that there was not a lot of fluid to be withdrawn.  Fluid analysis looks fine.  With that said, it is worth repeat assessment given how she is feeling. ?Please: ?1.  Large volume paracentesis up to 8 L (specify that). ?2.  IV albumin replacement per protocol ?3.  Send fluid for cell count with differential ?

## 2021-09-06 NOTE — Addendum Note (Signed)
Addended by: Rosanne Sack R on: 09/06/2021 11:50 AM ? ? Modules accepted: Orders ? ?

## 2021-09-06 NOTE — Telephone Encounter (Signed)
Inbound call from patient stating she would like to discuss the parenthesis that she had yesterday. Please advise.Marland Kitchen  ?

## 2021-09-06 NOTE — Addendum Note (Signed)
Addended by: Rosanne Sack R on: 09/06/2021 12:55 PM ? ? Modules accepted: Orders ? ?

## 2021-09-07 NOTE — Telephone Encounter (Signed)
Spoke with pt and she is aware of Dr. Blanch Media recommendation. She is aware of appt and knows to arrive Monday at 12:30pm for the 1pm appt. ?

## 2021-09-10 ENCOUNTER — Ambulatory Visit (HOSPITAL_COMMUNITY)
Admission: RE | Admit: 2021-09-10 | Discharge: 2021-09-10 | Disposition: A | Discharge status: Home / Self Care | Payer: Medicare Other | Source: Ambulatory Visit | Attending: Internal Medicine | Admitting: Internal Medicine

## 2021-09-10 DIAGNOSIS — M6259 Muscle wasting and atrophy, not elsewhere classified, multiple sites: Secondary | ICD-10-CM | POA: Diagnosis not present

## 2021-09-10 DIAGNOSIS — K420 Umbilical hernia with obstruction, without gangrene: Secondary | ICD-10-CM | POA: Diagnosis not present

## 2021-09-10 DIAGNOSIS — Z86010 Personal history of colonic polyps: Secondary | ICD-10-CM | POA: Diagnosis not present

## 2021-09-10 DIAGNOSIS — Z803 Family history of malignant neoplasm of breast: Secondary | ICD-10-CM | POA: Diagnosis not present

## 2021-09-10 DIAGNOSIS — D509 Iron deficiency anemia, unspecified: Secondary | ICD-10-CM | POA: Diagnosis not present

## 2021-09-10 DIAGNOSIS — R5381 Other malaise: Secondary | ICD-10-CM | POA: Diagnosis not present

## 2021-09-10 DIAGNOSIS — R140 Abdominal distension (gaseous): Secondary | ICD-10-CM | POA: Diagnosis not present

## 2021-09-10 DIAGNOSIS — R41841 Cognitive communication deficit: Secondary | ICD-10-CM | POA: Diagnosis not present

## 2021-09-10 DIAGNOSIS — K55029 Acute infarction of small intestine, extent unspecified: Secondary | ICD-10-CM | POA: Diagnosis not present

## 2021-09-10 DIAGNOSIS — Z20822 Contact with and (suspected) exposure to covid-19: Secondary | ICD-10-CM | POA: Diagnosis not present

## 2021-09-10 DIAGNOSIS — Z79890 Hormone replacement therapy (postmenopausal): Secondary | ICD-10-CM | POA: Diagnosis not present

## 2021-09-10 DIAGNOSIS — M6281 Muscle weakness (generalized): Secondary | ICD-10-CM | POA: Diagnosis not present

## 2021-09-10 DIAGNOSIS — T500X5A Adverse effect of mineralocorticoids and their antagonists, initial encounter: Secondary | ICD-10-CM | POA: Diagnosis not present

## 2021-09-10 DIAGNOSIS — R2689 Other abnormalities of gait and mobility: Secondary | ICD-10-CM | POA: Diagnosis not present

## 2021-09-10 DIAGNOSIS — I700 Atherosclerosis of aorta: Secondary | ICD-10-CM | POA: Diagnosis not present

## 2021-09-10 DIAGNOSIS — C220 Liver cell carcinoma: Secondary | ICD-10-CM | POA: Diagnosis not present

## 2021-09-10 DIAGNOSIS — E871 Hypo-osmolality and hyponatremia: Secondary | ICD-10-CM | POA: Diagnosis not present

## 2021-09-10 DIAGNOSIS — R54 Age-related physical debility: Secondary | ICD-10-CM | POA: Diagnosis present

## 2021-09-10 DIAGNOSIS — Z87891 Personal history of nicotine dependence: Secondary | ICD-10-CM | POA: Diagnosis not present

## 2021-09-10 DIAGNOSIS — R262 Difficulty in walking, not elsewhere classified: Secondary | ICD-10-CM | POA: Diagnosis not present

## 2021-09-10 DIAGNOSIS — E785 Hyperlipidemia, unspecified: Secondary | ICD-10-CM | POA: Diagnosis present

## 2021-09-10 DIAGNOSIS — K7460 Unspecified cirrhosis of liver: Secondary | ICD-10-CM | POA: Diagnosis not present

## 2021-09-10 DIAGNOSIS — Z923 Personal history of irradiation: Secondary | ICD-10-CM | POA: Diagnosis not present

## 2021-09-10 DIAGNOSIS — K55069 Acute infarction of intestine, part and extent unspecified: Secondary | ICD-10-CM | POA: Diagnosis not present

## 2021-09-10 DIAGNOSIS — Z7401 Bed confinement status: Secondary | ICD-10-CM | POA: Diagnosis not present

## 2021-09-10 DIAGNOSIS — K460 Unspecified abdominal hernia with obstruction, without gangrene: Secondary | ICD-10-CM | POA: Diagnosis not present

## 2021-09-10 DIAGNOSIS — K219 Gastro-esophageal reflux disease without esophagitis: Secondary | ICD-10-CM | POA: Diagnosis not present

## 2021-09-10 DIAGNOSIS — Z823 Family history of stroke: Secondary | ICD-10-CM | POA: Diagnosis not present

## 2021-09-10 DIAGNOSIS — E875 Hyperkalemia: Secondary | ICD-10-CM | POA: Diagnosis not present

## 2021-09-10 DIAGNOSIS — K6389 Other specified diseases of intestine: Secondary | ICD-10-CM | POA: Diagnosis not present

## 2021-09-10 DIAGNOSIS — E8770 Fluid overload, unspecified: Secondary | ICD-10-CM | POA: Diagnosis present

## 2021-09-10 DIAGNOSIS — K429 Umbilical hernia without obstruction or gangrene: Secondary | ICD-10-CM | POA: Diagnosis not present

## 2021-09-10 DIAGNOSIS — K7581 Nonalcoholic steatohepatitis (NASH): Secondary | ICD-10-CM | POA: Diagnosis not present

## 2021-09-10 DIAGNOSIS — R161 Splenomegaly, not elsewhere classified: Secondary | ICD-10-CM | POA: Diagnosis not present

## 2021-09-10 DIAGNOSIS — E119 Type 2 diabetes mellitus without complications: Secondary | ICD-10-CM | POA: Diagnosis not present

## 2021-09-10 DIAGNOSIS — Z8505 Personal history of malignant neoplasm of liver: Secondary | ICD-10-CM | POA: Diagnosis not present

## 2021-09-10 DIAGNOSIS — R188 Other ascites: Secondary | ICD-10-CM | POA: Diagnosis not present

## 2021-09-10 DIAGNOSIS — E039 Hypothyroidism, unspecified: Secondary | ICD-10-CM | POA: Diagnosis not present

## 2021-09-10 DIAGNOSIS — R1312 Dysphagia, oropharyngeal phase: Secondary | ICD-10-CM | POA: Diagnosis not present

## 2021-09-10 DIAGNOSIS — D259 Leiomyoma of uterus, unspecified: Secondary | ICD-10-CM | POA: Diagnosis not present

## 2021-09-10 DIAGNOSIS — I959 Hypotension, unspecified: Secondary | ICD-10-CM | POA: Diagnosis not present

## 2021-09-10 DIAGNOSIS — D696 Thrombocytopenia, unspecified: Secondary | ICD-10-CM | POA: Diagnosis not present

## 2021-09-10 DIAGNOSIS — I10 Essential (primary) hypertension: Secondary | ICD-10-CM | POA: Diagnosis not present

## 2021-09-10 HISTORY — PX: IR PARACENTESIS: IMG2679

## 2021-09-10 LAB — GRAM STAIN: Gram Stain: NONE SEEN

## 2021-09-10 MED ORDER — LIDOCAINE HCL 1 % IJ SOLN
INTRAMUSCULAR | Status: AC
  Administered 2021-09-10: 10 mL
  Filled 2021-09-10: qty 20

## 2021-09-10 NOTE — Procedures (Signed)
PROCEDURE SUMMARY: ? ?Successful image-guided paracentesis from the left lower abdomen.  ?Yielded 1.5L liters of hazy amber fluid.  ?No immediate complications.  ?EBL = trace. ?Patient tolerated well.  ? ?Specimen was sent for labs. ? ?Please see imaging section of Epic for full dictation. ? ? ?Sade Mehlhoff H Dalaina Tates PA-C ?09/10/2021 ?2:34 PM ? ? ? ?

## 2021-09-11 ENCOUNTER — Encounter (HOSPITAL_COMMUNITY): Payer: Self-pay

## 2021-09-11 ENCOUNTER — Other Ambulatory Visit: Payer: Self-pay | Admitting: *Deleted

## 2021-09-11 ENCOUNTER — Other Ambulatory Visit: Payer: Self-pay

## 2021-09-11 ENCOUNTER — Inpatient Hospital Stay (HOSPITAL_COMMUNITY)
Admission: EM | Admit: 2021-09-11 | Discharge: 2021-09-21 | DRG: 329 | Disposition: A | Discharge status: Skilled Nursing Facility | Payer: Medicare Other | Attending: Internal Medicine | Admitting: Internal Medicine

## 2021-09-11 DIAGNOSIS — E8770 Fluid overload, unspecified: Secondary | ICD-10-CM | POA: Diagnosis present

## 2021-09-11 DIAGNOSIS — K7460 Unspecified cirrhosis of liver: Secondary | ICD-10-CM | POA: Diagnosis present

## 2021-09-11 DIAGNOSIS — K420 Umbilical hernia with obstruction, without gangrene: Principal | ICD-10-CM | POA: Diagnosis present

## 2021-09-11 DIAGNOSIS — E041 Nontoxic single thyroid nodule: Secondary | ICD-10-CM | POA: Diagnosis present

## 2021-09-11 DIAGNOSIS — Z7984 Long term (current) use of oral hypoglycemic drugs: Secondary | ICD-10-CM

## 2021-09-11 DIAGNOSIS — Z923 Personal history of irradiation: Secondary | ICD-10-CM

## 2021-09-11 DIAGNOSIS — E119 Type 2 diabetes mellitus without complications: Secondary | ICD-10-CM | POA: Diagnosis present

## 2021-09-11 DIAGNOSIS — C220 Liver cell carcinoma: Secondary | ICD-10-CM

## 2021-09-11 DIAGNOSIS — T500X5A Adverse effect of mineralocorticoids and their antagonists, initial encounter: Secondary | ICD-10-CM | POA: Diagnosis not present

## 2021-09-11 DIAGNOSIS — K460 Unspecified abdominal hernia with obstruction, without gangrene: Secondary | ICD-10-CM | POA: Diagnosis not present

## 2021-09-11 DIAGNOSIS — R059 Cough, unspecified: Secondary | ICD-10-CM | POA: Diagnosis not present

## 2021-09-11 DIAGNOSIS — Z823 Family history of stroke: Secondary | ICD-10-CM

## 2021-09-11 DIAGNOSIS — Z79899 Other long term (current) drug therapy: Secondary | ICD-10-CM

## 2021-09-11 DIAGNOSIS — E039 Hypothyroidism, unspecified: Secondary | ICD-10-CM | POA: Diagnosis present

## 2021-09-11 DIAGNOSIS — Z803 Family history of malignant neoplasm of breast: Secondary | ICD-10-CM

## 2021-09-11 DIAGNOSIS — R54 Age-related physical debility: Secondary | ICD-10-CM | POA: Diagnosis present

## 2021-09-11 DIAGNOSIS — Z86010 Personal history of colonic polyps: Secondary | ICD-10-CM

## 2021-09-11 DIAGNOSIS — K55029 Acute infarction of small intestine, extent unspecified: Secondary | ICD-10-CM | POA: Diagnosis present

## 2021-09-11 DIAGNOSIS — K7581 Nonalcoholic steatohepatitis (NASH): Secondary | ICD-10-CM | POA: Diagnosis present

## 2021-09-11 DIAGNOSIS — E871 Hypo-osmolality and hyponatremia: Secondary | ICD-10-CM

## 2021-09-11 DIAGNOSIS — E875 Hyperkalemia: Secondary | ICD-10-CM

## 2021-09-11 DIAGNOSIS — Z8505 Personal history of malignant neoplasm of liver: Secondary | ICD-10-CM

## 2021-09-11 DIAGNOSIS — E785 Hyperlipidemia, unspecified: Secondary | ICD-10-CM | POA: Diagnosis present

## 2021-09-11 DIAGNOSIS — K219 Gastro-esophageal reflux disease without esophagitis: Secondary | ICD-10-CM | POA: Diagnosis present

## 2021-09-11 DIAGNOSIS — Z79890 Hormone replacement therapy (postmenopausal): Secondary | ICD-10-CM

## 2021-09-11 DIAGNOSIS — D696 Thrombocytopenia, unspecified: Secondary | ICD-10-CM | POA: Diagnosis present

## 2021-09-11 DIAGNOSIS — D509 Iron deficiency anemia, unspecified: Secondary | ICD-10-CM | POA: Diagnosis present

## 2021-09-11 DIAGNOSIS — I10 Essential (primary) hypertension: Secondary | ICD-10-CM | POA: Diagnosis present

## 2021-09-11 DIAGNOSIS — E1169 Type 2 diabetes mellitus with other specified complication: Secondary | ICD-10-CM

## 2021-09-11 DIAGNOSIS — R5381 Other malaise: Secondary | ICD-10-CM

## 2021-09-11 DIAGNOSIS — K429 Umbilical hernia without obstruction or gangrene: Secondary | ICD-10-CM | POA: Diagnosis present

## 2021-09-11 DIAGNOSIS — R188 Other ascites: Secondary | ICD-10-CM | POA: Diagnosis present

## 2021-09-11 DIAGNOSIS — Z87891 Personal history of nicotine dependence: Secondary | ICD-10-CM

## 2021-09-11 LAB — COMPREHENSIVE METABOLIC PANEL
ALT: 19 U/L (ref 0–44)
AST: 32 U/L (ref 15–41)
Albumin: 4 g/dL (ref 3.5–5.0)
Alkaline Phosphatase: 82 U/L (ref 38–126)
Anion gap: 12 (ref 5–15)
BUN: 23 mg/dL (ref 8–23)
CO2: 20 mmol/L — ABNORMAL LOW (ref 22–32)
Calcium: 9.5 mg/dL (ref 8.9–10.3)
Chloride: 106 mmol/L (ref 98–111)
Creatinine, Ser: 0.9 mg/dL (ref 0.44–1.00)
GFR, Estimated: >60 mL/min (ref >60)
Glucose, Bld: 122 mg/dL — ABNORMAL HIGH (ref 70–99)
Potassium: 4.3 mmol/L (ref 3.5–5.1)
Sodium: 138 mmol/L (ref 135–145)
Total Bilirubin: 0.8 mg/dL (ref 0.3–1.2)
Total Protein: 7.5 g/dL (ref 6.5–8.1)

## 2021-09-11 LAB — CBC
HCT: 41.8 % (ref 36.0–46.0)
Hemoglobin: 14.4 g/dL (ref 12.0–15.0)
MCH: 31.8 pg (ref 26.0–34.0)
MCHC: 34.4 g/dL (ref 30.0–36.0)
MCV: 92.3 fL (ref 80.0–100.0)
Platelets: 127 10*3/uL — ABNORMAL LOW (ref 150–400)
RBC: 4.53 MIL/uL (ref 3.87–5.11)
RDW: 13.2 % (ref 11.5–15.5)
WBC: 4.6 10*3/uL (ref 4.0–10.5)
nRBC: 0 % (ref 0.0–0.2)

## 2021-09-11 LAB — LIPASE, BLOOD: Lipase: 30 U/L (ref 11–51)

## 2021-09-11 MED ORDER — OXYCODONE HCL 5 MG PO TABS
5.0000 mg | ORAL_TABLET | Freq: Once | ORAL | Status: AC
  Administered 2021-09-11: 5 mg via ORAL
  Filled 2021-09-11: qty 1

## 2021-09-11 NOTE — ED Triage Notes (Signed)
Patient c/o vomiting and abdominal pain after having paracentesis twice within a week. ?

## 2021-09-11 NOTE — ED Provider Triage Note (Signed)
Emergency Medicine Provider Triage Evaluation Note ? ?Crystal Barnett , a 81 y.o. female  was evaluated in triage.  Pt complains of vomiting. 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 Last had paracentesis yesterday with 1.5 L of hazy amber fluid removed, prior para on 4/26 with 1L of cloudy yellow fluid. Since the procedure patient has had persistent vomiting and abdominal pain.  ? ?She said overall she hasn't been feeling well for about 10 days, but symptoms have been significantly worse since Monday.  ? ?Review of Systems  ?Positive: Vomiting, abdominal pain ?Negative: Fever, diarrhea ? ?Physical Exam  ?BP (!) 145/68 (BP Location: Left Arm)   Pulse (!) 59   Temp (!) 97.4 ?F (36.3 ?C) (Oral)   Resp 14   SpO2 96%  ?Gen:   Awake, no distress   ?Resp:  Normal effort  ?MSK:   Moves extremities without difficulty  ?Other:   ? ?Medical Decision Making  ?Medically screening exam initiated at 3:50 PM.  Appropriate orders placed.  Crystal Barnett was informed that the remainder of the evaluation will be completed by another provider, this initial triage assessment does not replace that evaluation, and the importance of remaining in the ED until their evaluation is complete. ? ? ?  ?Kateri Plummer, PA-C ?09/11/21 1556 ? ?

## 2021-09-12 ENCOUNTER — Emergency Department (HOSPITAL_COMMUNITY): Payer: Medicare Other

## 2021-09-12 ENCOUNTER — Other Ambulatory Visit: Payer: Self-pay

## 2021-09-12 ENCOUNTER — Inpatient Hospital Stay (HOSPITAL_COMMUNITY): Payer: Medicare Other | Admitting: Anesthesiology

## 2021-09-12 ENCOUNTER — Encounter (HOSPITAL_COMMUNITY): Payer: Self-pay

## 2021-09-12 ENCOUNTER — Encounter (HOSPITAL_COMMUNITY)
Admission: EM | Disposition: A | Discharge status: Skilled Nursing Facility | Payer: Self-pay | Source: Home / Self Care | Attending: Internal Medicine

## 2021-09-12 DIAGNOSIS — K55069 Acute infarction of intestine, part and extent unspecified: Secondary | ICD-10-CM

## 2021-09-12 DIAGNOSIS — E785 Hyperlipidemia, unspecified: Secondary | ICD-10-CM | POA: Diagnosis present

## 2021-09-12 DIAGNOSIS — I10 Essential (primary) hypertension: Secondary | ICD-10-CM | POA: Diagnosis present

## 2021-09-12 DIAGNOSIS — K429 Umbilical hernia without obstruction or gangrene: Secondary | ICD-10-CM | POA: Diagnosis not present

## 2021-09-12 DIAGNOSIS — M6259 Muscle wasting and atrophy, not elsewhere classified, multiple sites: Secondary | ICD-10-CM | POA: Diagnosis not present

## 2021-09-12 DIAGNOSIS — E119 Type 2 diabetes mellitus without complications: Secondary | ICD-10-CM | POA: Diagnosis present

## 2021-09-12 DIAGNOSIS — Z79890 Hormone replacement therapy (postmenopausal): Secondary | ICD-10-CM | POA: Diagnosis not present

## 2021-09-12 DIAGNOSIS — T500X5A Adverse effect of mineralocorticoids and their antagonists, initial encounter: Secondary | ICD-10-CM | POA: Diagnosis not present

## 2021-09-12 DIAGNOSIS — E039 Hypothyroidism, unspecified: Secondary | ICD-10-CM | POA: Diagnosis present

## 2021-09-12 DIAGNOSIS — K460 Unspecified abdominal hernia with obstruction, without gangrene: Secondary | ICD-10-CM | POA: Diagnosis present

## 2021-09-12 DIAGNOSIS — R41841 Cognitive communication deficit: Secondary | ICD-10-CM | POA: Diagnosis not present

## 2021-09-12 DIAGNOSIS — I959 Hypotension, unspecified: Secondary | ICD-10-CM | POA: Diagnosis not present

## 2021-09-12 DIAGNOSIS — Z823 Family history of stroke: Secondary | ICD-10-CM | POA: Diagnosis not present

## 2021-09-12 DIAGNOSIS — R1312 Dysphagia, oropharyngeal phase: Secondary | ICD-10-CM | POA: Diagnosis not present

## 2021-09-12 DIAGNOSIS — Z923 Personal history of irradiation: Secondary | ICD-10-CM | POA: Diagnosis not present

## 2021-09-12 DIAGNOSIS — M6281 Muscle weakness (generalized): Secondary | ICD-10-CM | POA: Diagnosis not present

## 2021-09-12 DIAGNOSIS — K6389 Other specified diseases of intestine: Secondary | ICD-10-CM | POA: Diagnosis not present

## 2021-09-12 DIAGNOSIS — R140 Abdominal distension (gaseous): Secondary | ICD-10-CM | POA: Diagnosis not present

## 2021-09-12 DIAGNOSIS — Z7401 Bed confinement status: Secondary | ICD-10-CM | POA: Diagnosis not present

## 2021-09-12 DIAGNOSIS — K7460 Unspecified cirrhosis of liver: Secondary | ICD-10-CM | POA: Diagnosis present

## 2021-09-12 DIAGNOSIS — Z20822 Contact with and (suspected) exposure to covid-19: Secondary | ICD-10-CM | POA: Diagnosis not present

## 2021-09-12 DIAGNOSIS — Z803 Family history of malignant neoplasm of breast: Secondary | ICD-10-CM | POA: Diagnosis not present

## 2021-09-12 DIAGNOSIS — D509 Iron deficiency anemia, unspecified: Secondary | ICD-10-CM | POA: Diagnosis present

## 2021-09-12 DIAGNOSIS — Z8505 Personal history of malignant neoplasm of liver: Secondary | ICD-10-CM | POA: Diagnosis not present

## 2021-09-12 DIAGNOSIS — R161 Splenomegaly, not elsewhere classified: Secondary | ICD-10-CM | POA: Diagnosis not present

## 2021-09-12 DIAGNOSIS — Z86010 Personal history of colonic polyps: Secondary | ICD-10-CM | POA: Diagnosis not present

## 2021-09-12 DIAGNOSIS — R262 Difficulty in walking, not elsewhere classified: Secondary | ICD-10-CM | POA: Diagnosis not present

## 2021-09-12 DIAGNOSIS — K55029 Acute infarction of small intestine, extent unspecified: Secondary | ICD-10-CM | POA: Diagnosis present

## 2021-09-12 DIAGNOSIS — K420 Umbilical hernia with obstruction, without gangrene: Secondary | ICD-10-CM | POA: Diagnosis present

## 2021-09-12 DIAGNOSIS — R2689 Other abnormalities of gait and mobility: Secondary | ICD-10-CM | POA: Diagnosis not present

## 2021-09-12 DIAGNOSIS — D696 Thrombocytopenia, unspecified: Secondary | ICD-10-CM

## 2021-09-12 DIAGNOSIS — K7581 Nonalcoholic steatohepatitis (NASH): Secondary | ICD-10-CM

## 2021-09-12 DIAGNOSIS — I700 Atherosclerosis of aorta: Secondary | ICD-10-CM | POA: Diagnosis not present

## 2021-09-12 DIAGNOSIS — E1169 Type 2 diabetes mellitus with other specified complication: Secondary | ICD-10-CM

## 2021-09-12 DIAGNOSIS — E875 Hyperkalemia: Secondary | ICD-10-CM | POA: Diagnosis present

## 2021-09-12 DIAGNOSIS — E871 Hypo-osmolality and hyponatremia: Secondary | ICD-10-CM | POA: Diagnosis present

## 2021-09-12 DIAGNOSIS — D259 Leiomyoma of uterus, unspecified: Secondary | ICD-10-CM | POA: Diagnosis not present

## 2021-09-12 DIAGNOSIS — E8770 Fluid overload, unspecified: Secondary | ICD-10-CM | POA: Diagnosis present

## 2021-09-12 DIAGNOSIS — R5381 Other malaise: Secondary | ICD-10-CM | POA: Diagnosis not present

## 2021-09-12 DIAGNOSIS — R54 Age-related physical debility: Secondary | ICD-10-CM | POA: Diagnosis present

## 2021-09-12 DIAGNOSIS — Z87891 Personal history of nicotine dependence: Secondary | ICD-10-CM | POA: Diagnosis not present

## 2021-09-12 DIAGNOSIS — R188 Other ascites: Secondary | ICD-10-CM | POA: Diagnosis present

## 2021-09-12 DIAGNOSIS — K219 Gastro-esophageal reflux disease without esophagitis: Secondary | ICD-10-CM | POA: Diagnosis present

## 2021-09-12 HISTORY — PX: UMBILICAL HERNIA REPAIR: SHX196

## 2021-09-12 HISTORY — PX: BOWEL RESECTION: SHX1257

## 2021-09-12 LAB — URINALYSIS, ROUTINE W REFLEX MICROSCOPIC
Bilirubin Urine: NEGATIVE
Glucose, UA: NEGATIVE mg/dL
Hgb urine dipstick: NEGATIVE
Ketones, ur: NEGATIVE mg/dL
Leukocytes,Ua: NEGATIVE
Nitrite: NEGATIVE
Protein, ur: NEGATIVE mg/dL
Specific Gravity, Urine: 1.032 — ABNORMAL HIGH (ref 1.005–1.030)
pH: 6 (ref 5.0–8.0)

## 2021-09-12 LAB — TYPE AND SCREEN
ABO/RH(D): A POS
Antibody Screen: NEGATIVE

## 2021-09-12 LAB — CBG MONITORING, ED: Glucose-Capillary: 93 mg/dL (ref 70–99)

## 2021-09-12 LAB — PROTIME-INR
INR: 1.2 (ref 0.8–1.2)
Prothrombin Time: 14.6 seconds (ref 11.4–15.2)

## 2021-09-12 LAB — LACTIC ACID, PLASMA
Lactic Acid, Venous: 1.4 mmol/L (ref 0.5–1.9)
Lactic Acid, Venous: 1.5 mmol/L (ref 0.5–1.9)

## 2021-09-12 LAB — ABO/RH: ABO/RH(D): A POS

## 2021-09-12 LAB — GLUCOSE, CAPILLARY: Glucose-Capillary: 94 mg/dL (ref 70–99)

## 2021-09-12 SURGERY — REPAIR, HERNIA, UMBILICAL, ADULT
Anesthesia: General | Site: Abdomen

## 2021-09-12 MED ORDER — ROCURONIUM BROMIDE 10 MG/ML (PF) SYRINGE
PREFILLED_SYRINGE | INTRAVENOUS | Status: AC
  Filled 2021-09-12: qty 10

## 2021-09-12 MED ORDER — HYDROMORPHONE HCL 1 MG/ML IJ SOLN
1.0000 mg | Freq: Once | INTRAMUSCULAR | Status: AC
  Administered 2021-09-12: 1 mg via INTRAVENOUS
  Filled 2021-09-12: qty 1

## 2021-09-12 MED ORDER — 0.9 % SODIUM CHLORIDE (POUR BTL) OPTIME
TOPICAL | Status: DC | PRN
Start: 2021-09-12 — End: 2021-09-12
  Administered 2021-09-12: 1000 mL

## 2021-09-12 MED ORDER — PROPOFOL 10 MG/ML IV BOLUS
INTRAVENOUS | Status: DC | PRN
  Administered 2021-09-12: 140 mg via INTRAVENOUS

## 2021-09-12 MED ORDER — ONDANSETRON HCL 4 MG/2ML IJ SOLN
4.0000 mg | Freq: Once | INTRAMUSCULAR | Status: AC
  Administered 2021-09-12: 4 mg via INTRAVENOUS
  Filled 2021-09-12: qty 2

## 2021-09-12 MED ORDER — FENTANYL CITRATE (PF) 100 MCG/2ML IJ SOLN
25.0000 ug | INTRAMUSCULAR | Status: DC | PRN
  Administered 2021-09-12 (×2): 50 ug via INTRAVENOUS

## 2021-09-12 MED ORDER — SODIUM CHLORIDE 0.9% FLUSH
3.0000 mL | Freq: Two times a day (BID) | INTRAVENOUS | Status: DC
  Administered 2021-09-12 – 2021-09-19 (×14): 3 mL via INTRAVENOUS

## 2021-09-12 MED ORDER — ACETAMINOPHEN 10 MG/ML IV SOLN: 1000.0000 mg | Freq: Once | INTRAVENOUS | Status: DC | PRN

## 2021-09-12 MED ORDER — FENTANYL CITRATE (PF) 100 MCG/2ML IJ SOLN
INTRAMUSCULAR | Status: AC
  Filled 2021-09-12: qty 2

## 2021-09-12 MED ORDER — LIDOCAINE 2% (20 MG/ML) 5 ML SYRINGE
INTRAMUSCULAR | Status: AC
  Filled 2021-09-12: qty 5

## 2021-09-12 MED ORDER — CEFAZOLIN SODIUM-DEXTROSE 2-4 GM/100ML-% IV SOLN
2.0000 g | Freq: Once | INTRAVENOUS | Status: AC
Start: 2021-09-12 — End: 2021-09-12
  Administered 2021-09-12: 2 g via INTRAVENOUS
  Filled 2021-09-12: qty 100

## 2021-09-12 MED ORDER — PROPOFOL 10 MG/ML IV BOLUS
INTRAVENOUS | Status: AC
  Filled 2021-09-12: qty 20

## 2021-09-12 MED ORDER — PROCHLORPERAZINE EDISYLATE 10 MG/2ML IJ SOLN
10.0000 mg | INTRAMUSCULAR | Status: DC | PRN
Start: 2021-09-12 — End: 2021-09-21

## 2021-09-12 MED ORDER — SIMETHICONE 80 MG PO CHEW
80.0000 mg | CHEWABLE_TABLET | Freq: Four times a day (QID) | ORAL | Status: DC | PRN
  Administered 2021-09-17: 80 mg via ORAL
  Filled 2021-09-12: qty 1

## 2021-09-12 MED ORDER — SUGAMMADEX SODIUM 200 MG/2ML IV SOLN
INTRAVENOUS | Status: DC | PRN
  Administered 2021-09-12 (×2): 100 mg via INTRAVENOUS

## 2021-09-12 MED ORDER — ONDANSETRON HCL 4 MG/2ML IJ SOLN
4.0000 mg | Freq: Four times a day (QID) | INTRAMUSCULAR | Status: DC | PRN
Start: 2021-09-12 — End: 2021-09-12

## 2021-09-12 MED ORDER — LACTATED RINGERS IV BOLUS
1000.0000 mL | Freq: Once | INTRAVENOUS | Status: AC
Start: 2021-09-12 — End: 2021-09-12
  Administered 2021-09-12: 1000 mL via INTRAVENOUS

## 2021-09-12 MED ORDER — HYDROMORPHONE HCL 1 MG/ML IJ SOLN: 0.5000 mg | INTRAMUSCULAR | Status: DC | PRN

## 2021-09-12 MED ORDER — LACTATED RINGERS IV SOLN: INTRAVENOUS | Status: DC | PRN

## 2021-09-12 MED ORDER — IOHEXOL 300 MG/ML  SOLN
100.0000 mL | Freq: Once | INTRAMUSCULAR | Status: AC | PRN
  Administered 2021-09-12: 100 mL via INTRAVENOUS

## 2021-09-12 MED ORDER — DEXAMETHASONE SODIUM PHOSPHATE 10 MG/ML IJ SOLN
INTRAMUSCULAR | Status: DC | PRN
Start: 2021-09-12 — End: 2021-09-12
  Administered 2021-09-12: 4 mg via INTRAVENOUS

## 2021-09-12 MED ORDER — ONDANSETRON HCL 4 MG/2ML IJ SOLN
INTRAMUSCULAR | Status: AC
  Filled 2021-09-12: qty 2

## 2021-09-12 MED ORDER — FENTANYL CITRATE (PF) 250 MCG/5ML IJ SOLN
INTRAMUSCULAR | Status: DC | PRN
Start: 2021-09-12 — End: 2021-09-12
  Administered 2021-09-12: 50 ug via INTRAVENOUS
  Administered 2021-09-12: 100 ug via INTRAVENOUS
  Administered 2021-09-12: 50 ug via INTRAVENOUS

## 2021-09-12 MED ORDER — DEXAMETHASONE SODIUM PHOSPHATE 10 MG/ML IJ SOLN
INTRAMUSCULAR | Status: AC
  Filled 2021-09-12: qty 1

## 2021-09-12 MED ORDER — ONDANSETRON HCL 4 MG/2ML IJ SOLN
INTRAMUSCULAR | Status: DC | PRN
Start: 2021-09-12 — End: 2021-09-12
  Administered 2021-09-12: 4 mg via INTRAVENOUS

## 2021-09-12 MED ORDER — HYDROMORPHONE HCL 1 MG/ML IJ SOLN
0.5000 mg | INTRAMUSCULAR | Status: DC | PRN
  Administered 2021-09-12 – 2021-09-15 (×3): 0.5 mg via INTRAVENOUS
  Filled 2021-09-12 (×4): qty 0.5

## 2021-09-12 MED ORDER — FENTANYL CITRATE (PF) 250 MCG/5ML IJ SOLN
INTRAMUSCULAR | Status: AC
  Filled 2021-09-12: qty 5

## 2021-09-12 MED ORDER — HYDROMORPHONE HCL 1 MG/ML IJ SOLN
0.5000 mg | Freq: Once | INTRAMUSCULAR | Status: AC
  Administered 2021-09-12: 0.5 mg via INTRAVENOUS
  Filled 2021-09-12: qty 1

## 2021-09-12 MED ORDER — ALBUTEROL SULFATE (2.5 MG/3ML) 0.083% IN NEBU: 2.5000 mg | INHALATION_SOLUTION | Freq: Four times a day (QID) | RESPIRATORY_TRACT | Status: DC | PRN

## 2021-09-12 MED ORDER — LIDOCAINE 2% (20 MG/ML) 5 ML SYRINGE
INTRAMUSCULAR | Status: DC | PRN
  Administered 2021-09-12: 60 mg via INTRAVENOUS

## 2021-09-12 MED ORDER — ROCURONIUM BROMIDE 10 MG/ML (PF) SYRINGE
PREFILLED_SYRINGE | INTRAVENOUS | Status: DC | PRN
  Administered 2021-09-12: 50 mg via INTRAVENOUS

## 2021-09-12 MED ORDER — ONDANSETRON HCL 4 MG/2ML IJ SOLN
4.0000 mg | Freq: Four times a day (QID) | INTRAMUSCULAR | Status: DC | PRN
  Administered 2021-09-12 – 2021-09-15 (×2): 4 mg via INTRAVENOUS
  Filled 2021-09-12 (×3): qty 2

## 2021-09-12 MED ORDER — DOCUSATE SODIUM 100 MG PO CAPS
100.0000 mg | ORAL_CAPSULE | Freq: Two times a day (BID) | ORAL | Status: DC
Start: 2021-09-12 — End: 2021-09-21
  Administered 2021-09-12 – 2021-09-19 (×15): 100 mg via ORAL
  Filled 2021-09-12 (×17): qty 1

## 2021-09-12 MED ORDER — BUPIVACAINE-EPINEPHRINE (PF) 0.25% -1:200000 IJ SOLN
INTRAMUSCULAR | Status: AC
  Filled 2021-09-12: qty 30

## 2021-09-12 MED ORDER — ONDANSETRON HCL 4 MG PO TABS: 4.0000 mg | ORAL_TABLET | Freq: Four times a day (QID) | ORAL | Status: DC | PRN

## 2021-09-12 MED ORDER — OXYCODONE HCL 5 MG PO TABS
10.0000 mg | ORAL_TABLET | ORAL | Status: DC | PRN
  Administered 2021-09-13 – 2021-09-16 (×7): 10 mg via ORAL
  Filled 2021-09-12 (×8): qty 2

## 2021-09-12 MED ORDER — ENOXAPARIN SODIUM 40 MG/0.4ML IJ SOSY
40.0000 mg | PREFILLED_SYRINGE | INTRAMUSCULAR | Status: DC
  Administered 2021-09-13 – 2021-09-20 (×8): 40 mg via SUBCUTANEOUS
  Filled 2021-09-12 (×8): qty 0.4

## 2021-09-12 MED ORDER — ACETAMINOPHEN 10 MG/ML IV SOLN
INTRAVENOUS | Status: AC
  Filled 2021-09-12: qty 100

## 2021-09-12 MED ORDER — CHLORHEXIDINE GLUCONATE 0.12 % MT SOLN
OROMUCOSAL | Status: AC
  Filled 2021-09-12: qty 15

## 2021-09-12 MED ORDER — ALBUMIN HUMAN 5 % IV SOLN: INTRAVENOUS | Status: DC | PRN

## 2021-09-12 MED ORDER — SODIUM CHLORIDE (PF) 0.9 % IJ SOLN
INTRAMUSCULAR | Status: AC
  Filled 2021-09-12: qty 50

## 2021-09-12 MED ORDER — OXYCODONE HCL 5 MG PO TABS
5.0000 mg | ORAL_TABLET | ORAL | Status: DC | PRN
  Administered 2021-09-13 – 2021-09-17 (×3): 5 mg via ORAL
  Filled 2021-09-12 (×3): qty 1

## 2021-09-12 SURGICAL SUPPLY — 46 items
BAG COUNTER SPONGE SURGICOUNT (BAG) IMPLANT
BINDER ABDOMINAL 12 ML 46-62 (SOFTGOODS) ×1 IMPLANT
BLADE CLIPPER SURG (BLADE) IMPLANT
CANISTER SUCT 3000ML PPV (MISCELLANEOUS) IMPLANT
CHLORAPREP W/TINT 26 (MISCELLANEOUS) ×2 IMPLANT
COVER SURGICAL LIGHT HANDLE (MISCELLANEOUS) ×2 IMPLANT
DECANTER SPIKE VIAL GLASS SM (MISCELLANEOUS) ×2 IMPLANT
DERMABOND ADVANCED (GAUZE/BANDAGES/DRESSINGS) ×1
DERMABOND ADVANCED .7 DNX12 (GAUZE/BANDAGES/DRESSINGS) ×1 IMPLANT
DRAPE LAPAROSCOPIC ABDOMINAL (DRAPES) ×1 IMPLANT
DRAPE LAPAROTOMY 100X72 PEDS (DRAPES) ×1 IMPLANT
DRSG PAD ABDOMINAL 8X10 ST (GAUZE/BANDAGES/DRESSINGS) ×1 IMPLANT
ELECT REM PT RETURN 9FT ADLT (ELECTROSURGICAL) ×2
ELECTRODE REM PT RTRN 9FT ADLT (ELECTROSURGICAL) ×1 IMPLANT
GLOVE SRG 8 PF TXTR STRL LF DI (GLOVE) ×1 IMPLANT
GLOVE SURG ENC MOIS LTX SZ7.5 (GLOVE) ×2 IMPLANT
GLOVE SURG UNDER POLY LF SZ8 (GLOVE) ×2
GOWN STRL REUS W/ TWL LRG LVL3 (GOWN DISPOSABLE) ×1 IMPLANT
GOWN STRL REUS W/ TWL XL LVL3 (GOWN DISPOSABLE) ×1 IMPLANT
GOWN STRL REUS W/TWL LRG LVL3 (GOWN DISPOSABLE) ×2
GOWN STRL REUS W/TWL XL LVL3 (GOWN DISPOSABLE) ×2
KIT BASIN OR (CUSTOM PROCEDURE TRAY) ×2 IMPLANT
KIT TURNOVER KIT B (KITS) ×2 IMPLANT
NDL HYPO 25GX1X1/2 BEV (NEEDLE) ×1 IMPLANT
NEEDLE HYPO 25GX1X1/2 BEV (NEEDLE) ×2 IMPLANT
NS IRRIG 1000ML POUR BTL (IV SOLUTION) ×2 IMPLANT
PACK GENERAL/GYN (CUSTOM PROCEDURE TRAY) ×2 IMPLANT
PAD ARMBOARD 7.5X6 YLW CONV (MISCELLANEOUS) ×2 IMPLANT
PENCIL SMOKE EVACUATOR (MISCELLANEOUS) ×1 IMPLANT
RELOAD PROXIMATE 75MM BLUE (ENDOMECHANICALS) ×6 IMPLANT
RELOAD STAPLE 75 3.8 BLU REG (ENDOMECHANICALS) IMPLANT
STAPLER PROXIMATE 75MM BLUE (STAPLE) ×1 IMPLANT
SUT MNCRL AB 4-0 PS2 18 (SUTURE) ×1 IMPLANT
SUT MON AB 4-0 PC3 18 (SUTURE) ×1 IMPLANT
SUT NOVA NAB GS-21 0 18 T12 DT (SUTURE) ×2 IMPLANT
SUT PDS AB 2-0 CT2 27 (SUTURE) ×2 IMPLANT
SUT SILK 2 0 SH (SUTURE) ×1 IMPLANT
SUT VIC AB 2-0 SH 18 (SUTURE) ×4 IMPLANT
SUT VIC AB 3-0 CT1 27 (SUTURE)
SUT VIC AB 3-0 CT1 TAPERPNT 27 (SUTURE) ×1 IMPLANT
SUT VIC AB 3-0 SH 8-18 (SUTURE) ×1 IMPLANT
SUT VICRYL AB 2 0 TIES (SUTURE) ×1 IMPLANT
SUT VICRYL AB 3 0 TIES (SUTURE) ×1 IMPLANT
SYR CONTROL 10ML LL (SYRINGE) ×2 IMPLANT
TOWEL GREEN STERILE (TOWEL DISPOSABLE) ×2 IMPLANT
TOWEL GREEN STERILE FF (TOWEL DISPOSABLE) ×2 IMPLANT

## 2021-09-12 NOTE — Anesthesia Postprocedure Evaluation (Signed)
Anesthesia Post Note ? ?Patient: Crystal Barnett ? ?Procedure(s) Performed: UMBILICAL HERNIA REPAIR (Abdomen) ?SMALL BOWEL RESECTION (Abdomen) ? ?  ? ?Patient location during evaluation: PACU ?Anesthesia Type: General ?Level of consciousness: awake and alert ?Pain management: pain level controlled ?Vital Signs Assessment: post-procedure vital signs reviewed and stable ?Respiratory status: spontaneous breathing, nonlabored ventilation, respiratory function stable and patient connected to nasal cannula oxygen ?Cardiovascular status: blood pressure returned to baseline and stable ?Postop Assessment: no apparent nausea or vomiting ?Anesthetic complications: no ? ? ?No notable events documented. ? ?Last Vitals:  ?Vitals:  ? 09/12/21 1155 09/12/21 1212  ?BP:  131/63  ?Pulse:  65  ?Resp:  16  ?Temp: 36.5 ?C 36.5 ?C  ?SpO2:  96%  ?  ?Last Pain:  ?Vitals:  ? 09/12/21 1212  ?TempSrc: Oral  ?PainSc:   ? ? ?  ?  ?  ?  ?  ?  ? ?March Rummage Quantae Martel ? ? ? ? ?

## 2021-09-12 NOTE — Transfer of Care (Signed)
Immediate Anesthesia Transfer of Care Note ? ?Patient: Crystal Barnett ? ?Procedure(s) Performed: UMBILICAL HERNIA REPAIR (Abdomen) ?SMALL BOWEL RESECTION (Abdomen) ? ?Patient Location: PACU ? ?Anesthesia Type:General ? ?Level of Consciousness: awake, alert  and oriented ? ?Airway & Oxygen Therapy: Patient Spontanous Breathing and Patient connected to face mask oxygen ? ?Post-op Assessment: Report given to RN and Post -op Vital signs reviewed and stable ? ?Post vital signs: Reviewed and stable ? ?Last Vitals:  ?Vitals Value Taken Time  ?BP 135/60 09/12/21 1055  ?Temp    ?Pulse 66 09/12/21 1057  ?Resp 14 09/12/21 1057  ?SpO2 99 % 09/12/21 1057  ?Vitals shown include unvalidated device data. ? ?Last Pain:  ?Vitals:  ? 09/12/21 1055  ?TempSrc:   ?PainSc: 10-Worst pain ever  ?   ? ?  ? ?Complications: No notable events documented. ?

## 2021-09-12 NOTE — ED Notes (Signed)
Per MD order- [facility]) transfer to [facility] request completed. RN advised. [facility] ?

## 2021-09-12 NOTE — Progress Notes (Signed)
Patient sent from [facility] to [facility] for emegent umbilical hernia repair.  Incarcerated umbilical hernia in patient with cirrhosis and ascites.  I recommended umbilical hernia repair with mesh.  The procedure, its risks, benefits and alternatives were discussed.  We discussed her increased risks related to cirrhosis and her medical issues.  After a full discussion and all questions answered the patient granted consent to proceed.  We will proceed to the operating room emergently this morning. ? ?Felicie Morn, MD ?General, Bariatric and Minimally Invasive Surgery ?[facility] ? ?

## 2021-09-12 NOTE — ED Notes (Signed)
Patient arrives via ED to ED transport and reports she has been sent to this ED for incarcerated hernia. Patient is alert and oriented and calm at this time.  ?

## 2021-09-12 NOTE — ED Provider Notes (Signed)
Patient arrives via ambulance on transfer from our affiliated facility due to concern of bowel obstruction with incarcerated hernia.  I discussed case with our surgeon, and plan is for OR today.  Given her medical issues, including cirrhosis, diabetes, hypertension patient will be admitted to the medicine service. ?  ?Carmin Muskrat, MD ?09/12/21 [phone] ? ?

## 2021-09-12 NOTE — Anesthesia Procedure Notes (Signed)
Procedure Name: Intubation ?Date/Time: 09/12/2021 8:58 AM ?Performed by: Trinna Post., CRNA ?Pre-anesthesia Checklist: Patient identified, Emergency Drugs available, Suction available, Patient being monitored and Timeout performed ?Patient Re-evaluated:Patient Re-evaluated prior to induction ?Oxygen Delivery Method: Circle system utilized ?Preoxygenation: Pre-oxygenation with 100% oxygen ?Induction Type: IV induction ?Ventilation: Mask ventilation without difficulty ?Laryngoscope Size: Mac and 3 ?Grade View: Grade I ?Tube type: Oral ?Tube size: 7.0 mm ?Number of attempts: 1 ?Airway Equipment and Method: Stylet ?Placement Confirmation: ETT inserted through vocal cords under direct vision, positive ETCO2 and breath sounds checked- equal and bilateral ?Secured at: 21 cm ?Tube secured with: Tape ?Dental Injury: Teeth and Oropharynx as per pre-operative assessment  ? ? ? ? ?

## 2021-09-12 NOTE — Anesthesia Preprocedure Evaluation (Addendum)
Anesthesia Evaluation  ?Patient identified by MRN, date of birth, ID band ?Patient awake ? ? ? ?Reviewed: ?Allergy & Precautions, NPO status , Patient's Chart, lab work & pertinent test results ? ?Airway ?Mallampati: II ? ?TM Distance: >3 FB ?Neck ROM: Full ? ? ? Dental ?no notable dental hx. ? ?  ?Pulmonary ?neg pulmonary ROS, former smoker,  ?  ?Pulmonary exam normal ? ? ? ? ? ? ? Cardiovascular ?hypertension,  ?Rhythm:Regular Rate:Normal ? ? ?  ?Neuro/Psych ?negative neurological ROS ? negative psych ROS  ? GI/Hepatic ?GERD  Medicated,(+) Cirrhosis  ?  ?  ? , Hepatitis -Incarcerated umbilical hernia  ?  ?Endo/Other  ?diabetesHypothyroidism  ? Renal/GU ?negative Renal ROS  ?negative genitourinary ?  ?Musculoskeletal ?negative musculoskeletal ROS ?(+)  ? Abdominal ?Normal abdominal exam  (+)   ?Peds ? Hematology ?negative hematology ROS ?(+)   ?Anesthesia Other Findings ? ? Reproductive/Obstetrics ? ?  ? ? ? ? ? ? ? ? ? ? ? ? ? ?  ?  ? ? ? ? ? ? ? ?Anesthesia Physical ?Anesthesia Plan ? ?ASA: 2 ? ?Anesthesia Plan: General  ? ?Post-op Pain Management:   ? ?Induction: Intravenous ? ?PONV Risk Score and Plan: 3 and Ondansetron, Dexamethasone and Treatment may vary due to age or medical condition ? ?Airway Management Planned: Mask and Oral ETT ? ?Additional Equipment: None ? ?Intra-op Plan:  ? ?Post-operative Plan: Extubation in OR ? ?Informed Consent: I have reviewed the patients History and Physical, chart, labs and discussed the procedure including the risks, benefits and alternatives for the proposed anesthesia with the patient or authorized representative who has indicated his/her understanding and acceptance.  ? ? ? ?Dental advisory given ? ?Plan Discussed with: CRNA ? ?Anesthesia Plan Comments: (Lab Results ?     Component                Value               Date                 ?     WBC                      4.6                 09/11/2021           ?     HGB                       14.4                09/11/2021           ?     HCT                      41.8                09/11/2021           ?     MCV                      92.3                09/11/2021           ?     PLT  127 (L)             09/11/2021           ?Lab Results ?     Component                Value               Date                 ?     NA                       138                 09/11/2021           ?     K                        4.3                 09/11/2021           ?     CO2                      20 (L)              09/11/2021           ?     GLUCOSE                  122 (H)             09/11/2021           ?     BUN                      23                  09/11/2021           ?     CREATININE               0.90                09/11/2021           ?     CALCIUM                  9.5                 09/11/2021           ?     EGFR                     85 (L)              12/02/2016           ?     GFRNONAA                 >60                 09/11/2021          )  ? ? ? ? ? ?Anesthesia Quick Evaluation ? ?

## 2021-09-12 NOTE — ED Notes (Signed)
Report given to charge RN at [facility]   ? ?

## 2021-09-12 NOTE — Op Note (Signed)
? ?Patient: Crystal Barnett (30-Mar-1941, [id number]) ? ?Date of Surgery: 09/11/2021 - 09/12/2021  ? ?Preoperative Diagnosis: Incarcerated umbilical hernia  ? ?Postoperative Diagnosis: Incarcerated umbilical hernia (with 1 cm x 1 cm fascial defect) with strangulated, necrotic small intestine ? ?Surgical Procedure: UMBILICAL HERNIA REPAIR:  ?SMALL BOWEL RESECTION: [id number]  ? ?Operative Team Members:  ?Surgeon(s) and Role: ?   * Damiana Berrian, Nickola Major, MD - Primary  ? ?Anesthesiologist: Darral Dash, DO ?CRNA: Trinna Post., CRNA  ? ?Anesthesia: General  ? ?Fluids:  ?Total I/O ?In: 950 [I.V.:700; IV Piggyback:250] ?Out: 19 [Blood:75] ? ?Complications: None ? ?Drains:   None   ? ?Specimen:  ?ID Type Source Tests Collected by Time Destination  ?1 : small bowel Tissue PATH GI biopsy SURGICAL PATHOLOGY Doryce Mcgregory, Nickola Major, MD 09/12/2021 [phone]   ?  ? ?Disposition:  PACU - hemodynamically stable. ? ?Plan of Care: Admit to inpatient  ? ? ? ?Indications for Procedure: Crystal Barnett is a 81 y.o. female who presented with pain at her umbilical hernia.  She was found to have an incarcerated umbilical hernia containing a loop of small intestine.  I recommended umbilical hernia repair, possibly with mesh, possible small bowel resection. ? ?The procedure itself as well as its risks, benefits and alternatives were discussed.  The risks discussed included but were not limited to the risk of infection, bleeding, damage to nearby structures, and her increased risks related to cirrhosis.  Dr. Donne Hazel used the surgical risk calculator to help with this discussion.  After a full discussion and all questions answered the patient granted consent to proceed. ? ?Findings: Incarcerated, strangulated loop of small intestine.  1 cm x 1 cm fascial defect ? ? ?Description of Procedure:  ? ?On the stay above patient taken operating suite placed supine position.  General endotracheal anesthesia was induced.  A timeout was completed verifying the  correct patient, procedure, position, and equipment needed for the case.  Patient's abdomen was prepped and draped in usual sterile fashion. ? ?We made a midline incision over the protuberant umbilical hernia.  Once through the skin we encountered a large volume of ascites which was evacuated from the field.  The incarcerated loop of small intestine was visible herniating through the fascial defect.  The fascial defect measured approximately 1 cm x 1 cm.  The incarcerated knuckle of small bowel appeared necrotic.  The fascial opening was widened both superiorly and inferiorly using electrocautery without any injury to the underlying viscera.  We then able to deliver the necrotic loop of bowel out of the wound and perform a bowel resection.  It was divided proximally and distally using the GIA stapler.  The mesentery was clamped with a Kelly, divided with scissors, and ligated with multiple 2-0 Vicryl sutures.  Enterotomies were made in the antimesenteric border of the proximal distal small intestine.  The GIA 75 mm blue load stapler was inserted into the bowel and fired to create the anastomosis.  The common enterotomy was closed using a final firing of the GIA stapler.  The staple line was oversewn using multiple 2-0 Vicryl sutures.  A crotch stitch was placed to reinforce the anastomosis.  The small bowel was returned to the abdomen.  The hernia and fascial opening was closed using multiple figure-of-eight 0 Novafil sutures.  The subcutaneous tissue was irrigated.  The subcutaneous tissues were reapproximated using multiple 2-0 Vicryl sutures.  The protuberant excess skin that was thinned was resected in the skin was closed  using 4-0 Monocryl and Dermabond.  All sponge and needle counts were correct at the end this case ? ? ?At the end of the case we reviewed the infection status of the case. ?Patient: Crystal Barnett Emergency General Surgery Service Patient ?Case: Emergent ?Infection Present At Time Of Surgery  (PATOS): Some spillage of intestinal contents related to the small bowel resection ? ?Louanna Raw, MD ?General, Bariatric, & Minimally Invasive Surgery ?[facility], [place] ? ?

## 2021-09-12 NOTE — ED Provider Notes (Signed)
?[facility] DEPT ?Provider Note ? ? ?CSN: [id number] ?Arrival date & time: 09/11/21  1422 ? ?  ? ?History ? ?Chief Complaint  ?Patient presents with  ? Abdominal Pain  ? Emesis  ? ? ?Crystal Barnett is a 81 y.o. female. ? 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 Has a h/o of umbilical hernia which she states is stable. Last BM was Monday and normal. No fever or chills. Never really had pain like this before. States it is worse when she walks and worse when hitting bumps. Started Tuesday morning.  ? ? ?Abdominal Pain ?Associated symptoms: vomiting   ?Emesis ?Associated symptoms: abdominal pain   ? ?  ? ?Home Medications ?Prior to Admission medications   ?Medication Sig Start Date End Date Taking? Authorizing Provider  ?Cholecalciferol (VITAMIN D) 50 MCG (2000 UT) CAPS Take 2,000 Units by mouth at bedtime.    [provider]  ?furosemide (LASIX) 40 MG tablet Take 1 tablet (40 mg total) by mouth daily. 06/12/21   Irene Shipper, MD  ?levothyroxine (SYNTHROID) 50 MCG tablet Take 1 tablet (50 mcg total) by mouth daily. 09/04/21   Renato Shin, MD  ?metFORMIN (GLUCOPHAGE) 500 MG tablet Take 500 mg by mouth every evening. Take 1 tablet by mouth once daily    [provider]  ?montelukast (SINGULAIR) 10 MG tablet Take 10 mg by mouth at bedtime.  03/25/14   [provider]  ?Multiple Vitamin (MULTIVITAMIN) tablet Take 1 tablet by mouth at bedtime.     [provider]  ?pantoprazole (PROTONIX) 40 MG tablet Take 1 tablet (40 mg total) by mouth 2 (two) times daily. 06/12/21   Irene Shipper, MD  ?polyethylene glycol (MIRALAX / GLYCOLAX) 17 g packet Take 17 g by mouth daily.    [provider]  ?spironolactone (ALDACTONE) 100 MG tablet Take 2 tablets (200 mg total) by mouth daily. 06/12/21   Irene Shipper, MD  ?vitamin C  (ASCORBIC ACID) 500 MG tablet Take 500 mg by mouth daily.    [provider]  ?   ? ?Allergies    ?Patient has no known allergies.   ? ?Review of Systems   ?Review of Systems  ?Gastrointestinal:  Positive for abdominal pain and vomiting.  ? ?Physical Exam ?Updated Vital Signs ?BP (!) 146/80   Pulse 71   Temp 97.7 ?F (36.5 ?C) (Oral)   Resp 13   Ht 5' 4.5" (1.638 m)   Wt 75.3 kg   SpO2 98%   BMI 28.05 kg/m?  ?Physical Exam ?Vitals and nursing note reviewed.  ?Constitutional:   ?   Appearance: She is well-developed.  ?HENT:  ?   Head: Normocephalic and atraumatic.  ?Cardiovascular:  ?   Rate and Rhythm: Normal rate and regular rhythm.  ?Pulmonary:  ?   Effort: No respiratory distress.  ?   Breath sounds: No stridor.  ?Abdominal:  ?   General: There is no distension.  ?   Tenderness: There is abdominal tenderness in the right upper quadrant and left upper quadrant. There is no guarding or rebound.  ?   Hernia: A hernia is present. Hernia is present in the umbilical area.  ?   Comments: Large reducible umbilical hernia with one area of tenderness and firmness. No surrounding erythema or induration.   ?Musculoskeletal:  ?  Cervical back: Normal range of motion.  ?Neurological:  ?   Mental Status: She is alert.  ? ? ?ED Results / Procedures / Treatments   ?Labs ?(all labs ordered are listed, but only abnormal results are displayed) ?Labs Reviewed  ?COMPREHENSIVE METABOLIC PANEL - Abnormal; Notable for the following components:  ?    Result Value  ? CO2 20 (*)   ? Glucose, Bld 122 (*)   ? All other components within normal limits  ?CBC - Abnormal; Notable for the following components:  ? Platelets 127 (*)   ? All other components within normal limits  ?LIPASE, BLOOD  ?URINALYSIS, ROUTINE W REFLEX MICROSCOPIC  ? ? ?EKG ?None ? ?Radiology ?IR Paracentesis ? ?Result Date: 09/10/2021 ?INDICATION: History of Lucas with recurrent ascites. Request for therapeutic and diagnostic paracentesis. EXAM: ULTRASOUND GUIDED   PARACENTESIS MEDICATIONS: 10 mL 1% lidocaine COMPLICATIONS: None immediate. PROCEDURE: Informed written consent was obtained from the patient after a discussion of the risks, benefits and alternatives to treatment. A timeout was performed prior to the initiation of the procedure. Initial ultrasound scanning demonstrates a small amount of ascites within the left lower abdominal quadrant. The left lower abdomen was prepped and draped in the usual sterile fashion. 1% lidocaine was used for local anesthesia. Following this, a 19 gauge, 10, Yueh catheter was introduced. An ultrasound image was saved for documentation purposes. The paracentesis was performed. The catheter was removed and a dressing was applied. The patient tolerated the procedure well without immediate post procedural complication. FINDINGS: A total of approximately 1.5 L of hazy amber fluid was removed. Samples were sent to the laboratory as requested by the clinical team. IMPRESSION: Successful ultrasound-guided paracentesis yielding 1.5 liters of peritoneal fluid. Read by: Durenda Guthrie, PA-C Electronically Signed   By: Corrie Mckusick D.O.   On: 09/10/2021 15:20   ? ?Procedures ?Marland KitchenCritical Care ?Performed by: Merrily Pew, MD ?Authorized by: Merrily Pew, MD  ? ?Critical care provider statement:  ?  Critical care time (minutes):  32 ?  Critical care time was exclusive of:  Separately billable procedures and treating other patients and teaching time ?  Critical care was necessary to treat or prevent imminent or life-threatening deterioration of the following conditions:  Dehydration (incarcerated hernia) ?  Critical care was time spent personally by me on the following activities:  Development of treatment plan with patient or surrogate, evaluation of patient's response to treatment, examination of patient, obtaining history from patient or surrogate, review of old charts, re-evaluation of patient's condition, pulse oximetry, ordering and performing  treatments and interventions and ordering and review of laboratory studies  ? ? ?Medications Ordered in ED ?Medications  ?lactated ringers bolus 1,000 mL (has no administration in time range)  ?ondansetron (ZOFRAN) injection 4 mg (has no administration in time range)  ?HYDROmorphone (DILAUDID) injection 1 mg (has no administration in time range)  ?oxyCODONE (Oxy IR/ROXICODONE) immediate release tablet 5 mg (5 mg Oral Given 09/11/21 1617)  ? ? ?ED Course/ Medical Decision Making/ A&P ?  ?                        ?Medical Decision Making ?Amount and/or Complexity of Data Reviewed ?Labs: ordered. ?Radiology: ordered. ? ?Risk ?Prescription drug management. ?Decision regarding hospitalization. ? ? ?Patient with a normal white blood cell count.  No fevers or chills.  She does have diffuse tenderness so bacterial peritonitis is still on the differential however her cultures from yesterday are negative  so far.  She could have peritonitis or some other reason to include perforation or complication from the procedure.  I do not think it is related to the umbilical hernia although pain is from that area.  Either way we will need a CT scan.  We will treat her symptomatically in the meantime. ?Patient does have evidence of incarcerated hernia on CT scan. The soft reducible part is ascites and not actually hernia. D/w Dr. Donne Hazel with EGS who prefers transfer to [facility] for surgical repair, will make his colleagues aware. D/w Dr. Sedonia Small who accepts for ED to ED transfer and surgical consultation.  ? ? ?Final Clinical Impression(s) / ED Diagnoses ?Final diagnoses:  ?None  ? ? ?Rx / DC Orders ?ED Discharge Orders   ? ? None  ? ?  ? ? ?  ?Musa Rewerts, Corene Cornea, MD ?09/13/21 0500 ? ?

## 2021-09-12 NOTE — Consult Note (Signed)
Initial Consultation Note ? ? ?Patient: Crystal Barnett PPJ:[id number] DOB: April 14, 1941 PCP: Vernie Shanks, MD ?DOA: 09/11/2021 ?DOS: the patient was seen and examined on 09/12/2021 ?Primary service: Norval Morton, MD ? ?Referring physician: Donne Hazel, MD ?Reason for consult: Medical management ? ? ?Assessment and Plan: ?Incarcerated umbilical hernia ?Acute.  Patient presented with complaints of abdominal pain with nausea and vomiting.  CT of the abdomen pelvis significant for large umbilical hernia containing ascites and dilated small loops of bowel concerning for incarcerated hernia.  General surgery was consulted and plan to take patient to surgery.  Patient had been given empiric antibiotics of cefazolin. ?-Admit to a surgical telemetry bed ?-N.p.o. ?-Dilaudid IV as needed for pain ?-Appreciate general surgery consultative services, we will follow-up for any further recommendations ?  ?NASH cirrhosis ?Imaging noted small volume ascites.  Home medication regimen includes spironolactone 200 mg and furosemide 40 mg daily. ?-Initially held home regimen.  Reassess when medically appropriate to start. ?  ?Diabetes mellitus type 2, well controlled ?Blood sugar initially 122.  Home medication regimen includes metformin 500 mg daily. ?-Hold metformin ?-Monitor CBGs and consider starting sliding scale of insulin if warranted ?  ?Hypothyroidism ?Home regimen includes levothyroxine for 50 mcg daily. ?-Resume levothyroxine when able ?  ?Thrombocytopenia ?Chronic.  Platelet count 121 ?  ?GERD ?-Changed Protonix IV twice daily ? ? ?TRH will continue to follow the patient. ? ?HPI: PARALEE Barnett is a 81 y.o. female with past medical history of hypertension, [name] cirrhosis, concern for [place] with normal AFP s/p IR intervention, thrombocytopenia, and umbilical hernia who presented with complaints of abdominal pain with nausea and vomiting.  Patient reported that last week she had two paracentesis due to abdominal pain where they were  only able to take off 1 L of fluid with each.  Yesterday she started having nausea and vomiting after trying to eat.  Emesis is none bloody.  Last bowel movement was 2 days ago.  Denies any significant fever or chills.  At this time pain is present but not severe since she has not eaten or drank anything. ?  ?Upon admission to the emergency department patient was noted to be afebrile with vital signs relatively within normal limits.  Labs appear to be around patient's baseline with platelets 127, and no other acute abnormality appreciated.  Urinalysis noted elevated specific gravity.  CT scan of the abdomen pelvis noted a large umbilical hernia containing ascites and dilated small bowel with a transition point for small bowel obstruction concerning for strangulated or incarcerated hernia and hepatic cirrhosis.  Patient has been given IV fluids, Zofran,  Dilaudid, and cefazolin.  General surgery was consulted and plan to take patient to surgery.  TRH called to admit. ? ?Review of Systems: As mentioned in the history of present illness. All other systems reviewed and are negative. ?Past Medical History:  ?Diagnosis Date  ? ALLERGIC RHINITIS 01/12/2008  ? Allergy   ? Cancer [facility])   ? liver cancer hepatocellular carcinoma per pt   ? Cataract   ? bilateral  ? Colon polyps   ? hyperplastic  ? Constipation   ? ongoing issues per pt  ? Diabetes ([place])   ? Esophageal varices (HCC)   ? Fatty liver   ? GOITER, MULTINODULAR 01/12/2008  ? Hepatic cirrhosis ([place])   ? History of radiation therapy 11/20/2018  ? x 1 radiation for liver cancer  ? Hypertension   ? under control  ? HYPOTHYROIDISM 09/07/2008  ? Lichen  planus   ? In the mouth, Notes that she's had this for at least 20 years  ? Liver cirrhosis secondary to NASH [facility])   ? Osteoporosis   ? Portal venous hypertension (HCC)   ? Splenomegaly   ? Thrombocytopenia ([place])   ? Appears chronic from at least 2014  ? Vitamin D deficiency   ? ?Past Surgical History:  ?Procedure Laterality  Date  ? arthroscopic left knee  2005  ? BREAST BIOPSY Left 2017  ? BREAST EXCISIONAL BIOPSY Right 2008  ? BREAST SURGERY  2008  ? Breast Biopsy   ? CHOLECYSTECTOMY  1985  ? COLONOSCOPY    ? IR ANGIOGRAM SELECTIVE EACH ADDITIONAL VESSEL  11/03/2018  ? IR ANGIOGRAM SELECTIVE EACH ADDITIONAL VESSEL  11/03/2018  ? IR ANGIOGRAM SELECTIVE EACH ADDITIONAL VESSEL  11/03/2018  ? IR ANGIOGRAM SELECTIVE EACH ADDITIONAL VESSEL  11/03/2018  ? IR ANGIOGRAM SELECTIVE EACH ADDITIONAL VESSEL  11/20/2018  ? IR ANGIOGRAM SELECTIVE EACH ADDITIONAL VESSEL  11/20/2018  ? IR ANGIOGRAM SELECTIVE EACH ADDITIONAL VESSEL  11/20/2018  ? IR ANGIOGRAM VISCERAL SELECTIVE  11/03/2018  ? IR ANGIOGRAM VISCERAL SELECTIVE  11/03/2018  ? IR ANGIOGRAM VISCERAL SELECTIVE  11/03/2018  ? IR ANGIOGRAM VISCERAL SELECTIVE  11/20/2018  ? IR ANGIOGRAM VISCERAL SELECTIVE  11/20/2018  ? IR EMBO TUMOR ORGAN ISCHEMIA INFARCT INC GUIDE ROADMAPPING  11/20/2018  ? IR GENERIC HISTORICAL  04/02/2016  ? IR RADIOLOGIST EVAL & MGMT 04/02/2016 Jacqulynn Cadet, MD [facility] INTERV RAD  ? IR PARACENTESIS  02/02/2018  ? IR PARACENTESIS  04/20/2018  ? IR PARACENTESIS  12/03/2018  ? IR PARACENTESIS  07/15/2019  ? IR PARACENTESIS  02/25/2020  ? IR PARACENTESIS  06/22/2020  ? IR PARACENTESIS  08/08/2020  ? IR PARACENTESIS  09/18/2020  ? IR PARACENTESIS  12/06/2020  ? IR PARACENTESIS  03/23/2021  ? IR PARACENTESIS  09/05/2021  ? IR PARACENTESIS  09/10/2021  ? IR RADIOLOGIST EVAL & MGMT  10/29/2016  ? IR RADIOLOGIST EVAL & MGMT  04/29/2017  ? IR RADIOLOGIST EVAL & MGMT  11/04/2017  ? IR RADIOLOGIST EVAL & MGMT  04/16/2018  ? IR RADIOLOGIST EVAL & MGMT  12/08/2018  ? IR RADIOLOGIST EVAL & MGMT  03/16/2019  ? IR RADIOLOGIST EVAL & MGMT  07/08/2019  ? IR RADIOLOGIST EVAL & MGMT  10/07/2019  ? IR RADIOLOGIST EVAL & MGMT  01/05/2020  ? IR RADIOLOGIST EVAL & MGMT  03/23/2020  ? IR RADIOLOGIST EVAL & MGMT  09/26/2020  ? IR RADIOLOGIST EVAL & MGMT  04/11/2021  ? IR US GUIDE VASC ACCESS LEFT  11/20/2018  ? IR US GUIDE VASC  ACCESS RIGHT  11/03/2018  ? POLYPECTOMY    ? UPPER GASTROINTESTINAL ENDOSCOPY    ? ?Social History:  reports that she quit smoking about 31 years ago. Her smoking use included cigarettes. She has never used smokeless tobacco. She reports that she does not drink alcohol and does not use drugs. ? ?No Known Allergies ? ?Family History  ?Problem Relation Age of Onset  ? Breast cancer Mother   ?     in 61's  ? Stroke Father 102  ? Goiter Maternal Grandmother   ? Colon cancer Neg Hx   ? Colon polyps Neg Hx   ? Esophageal cancer Neg Hx   ? Stomach cancer Neg Hx   ? Rectal cancer Neg Hx   ? Pancreatic cancer Neg Hx   ? ? ?Prior to Admission medications   ?Medication Sig Start Date End  Date Taking? Authorizing Provider  ?Cholecalciferol (VITAMIN D) 50 MCG (2000 UT) CAPS Take 2,000 Units by mouth at bedtime.   Yes [provider]  ?fluticasone (FLONASE) 50 MCG/ACT nasal spray Place 1 spray into both nostrils daily. 08/21/21  Yes [provider]  ?furosemide (LASIX) 40 MG tablet Take 1 tablet (40 mg total) by mouth daily. 06/12/21  Yes Irene Shipper, MD  ?levothyroxine (SYNTHROID) 50 MCG tablet Take 1 tablet (50 mcg total) by mouth daily. 09/04/21  Yes Renato Shin, MD  ?metFORMIN (GLUCOPHAGE) 500 MG tablet Take 500 mg by mouth every evening. Take 1 tablet by mouth once daily   Yes [provider]  ?montelukast (SINGULAIR) 10 MG tablet Take 10 mg by mouth at bedtime.  03/25/14  Yes [provider]  ?Multiple Vitamin (MULTIVITAMIN) tablet Take 1 tablet by mouth at bedtime.    Yes [provider]  ?pantoprazole (PROTONIX) 40 MG tablet Take 1 tablet (40 mg total) by mouth 2 (two) times daily. 06/12/21  Yes Irene Shipper, MD  ?polyethylene glycol (MIRALAX / GLYCOLAX) 17 g packet Take 8.5-17 g by mouth daily.   Yes [provider]  ?spironolactone (ALDACTONE) 100 MG tablet Take 2 tablets (200 mg total) by mouth daily. 06/12/21  Yes Irene Shipper, MD  ?vitamin C (ASCORBIC ACID) 500 MG  tablet Take 500 mg by mouth daily.   Yes [provider]  ? ? ?Physical Exam: ?Vitals:  ? 09/12/21 0530 09/12/21 1610 09/12/21 9604 09/12/21 0754  ?BP: (!) 108/54 116/61 (!) 114/55 (!) 120/56  ?Puls

## 2021-09-12 NOTE — ED Provider Notes (Signed)
?  Provider Note ?MRN:  [id number]  ?Arrival date & time: 09/12/21    ?ED Course and Medical Decision Making  ?Assumed care from Dr. Dayna Barker upon patient transfer. ? ?Abdominal pain, incarcerated hernia, transferred for surgery.  Will inform general surgeon of her arrival. ? ?On my assessment patient is sitting comfortably, received pain medicine with EMS. ? ?.Critical Care ?Performed by: Maudie Flakes, MD ?Authorized by: Maudie Flakes, MD  ? ?Critical care provider statement:  ?  Critical care time (minutes):  35 ?  Critical care was necessary to treat or prevent imminent or life-threatening deterioration of the following conditions: Incarcerated hernia. ?  Critical care was time spent personally by me on the following activities:  Development of treatment plan with patient or surrogate, discussions with consultants, evaluation of patient's response to treatment, examination of patient, ordering and review of laboratory studies, ordering and review of radiographic studies, ordering and performing treatments and interventions, pulse oximetry, re-evaluation of patient's condition and review of old charts ?  I assumed direction of critical care for this patient from another provider in my specialty: yes   ? ?Final Clinical Impressions(s) / ED Diagnoses  ? ?  ICD-10-CM   ?1. Incarcerated hernia  K46.0   ?  ?  ?ED Discharge Orders   ? ? None  ? ?  ?  ?Discharge Instructions   ?None ?  ? ? ?Crystal Barnett. Crystal Small, MD ?[facility] Emergency Medicine ?[email or web address] ? ?  ?Maudie Flakes, MD ?09/12/21 [phone] ? ?

## 2021-09-12 NOTE — H&P (Deleted)
?History and Physical  ? ? ?Patient: Crystal Barnett CNO:[id number] DOB: 1941-04-01 ?DOA: 09/11/2021 ?DOS: the patient was seen and examined on 09/12/2021 ?PCP: Vernie Shanks, MD  ?Patient coming from: Home ? ?Chief Complaint:  ?Chief Complaint  ?Patient presents with  ? Abdominal Pain  ? Emesis  ? ?HPI: Crystal Barnett is a 81 y.o. female with medical history significant of hypertension, [name] cirrhosis, concern for [place] with normal AFP s/p IR intervention, thrombocytopenia, and umbilical hernia who presented with complaints of abdominal pain with nausea and vomiting.  Patient reported that last week she had two paracentesis due to abdominal pain where they were only able to take off 1 L of fluid with each.  Yesterday she started having nausea and vomiting after trying to eat.  Emesis is none bloody.  Last bowel movement was 2 days ago.  Denies any significant fever or chills.  At this time pain is present but not severe since she has not eaten or drank anything. ? ?Upon admission to the emergency department patient was noted to be afebrile with vital signs relatively within normal limits.  Labs appear to be around patient's baseline with platelets 127, and no other acute abnormality appreciated.  Urinalysis noted elevated specific gravity.  CT scan of the abdomen pelvis noted a large umbilical hernia containing ascites and dilated small bowel with a transition point for small bowel obstruction concerning for strangulated or incarcerated hernia and hepatic cirrhosis.  Patient has been given IV fluids, Zofran,  Dilaudid, and cefazolin.  General surgery was consulted and plan to take patient to surgery.  TRH called to admit. ? ?review of Systems: As mentioned in the history of present illness. All other systems reviewed and are negative. ?Past Medical History:  ?Diagnosis Date  ? ALLERGIC RHINITIS 01/12/2008  ? Allergy   ? Cancer [facility])   ? liver cancer hepatocellular carcinoma per pt   ? Cataract   ? bilateral  ? Colon polyps    ? hyperplastic  ? Constipation   ? ongoing issues per pt  ? Diabetes ([place])   ? Esophageal varices (HCC)   ? Fatty liver   ? GOITER, MULTINODULAR 01/12/2008  ? Hepatic cirrhosis ([place])   ? History of radiation therapy 11/20/2018  ? x 1 radiation for liver cancer  ? Hypertension   ? under control  ? HYPOTHYROIDISM 09/07/2008  ? Lichen planus   ? In the mouth, Notes that she's had this for at least 20 years  ? Liver cirrhosis secondary to NASH [facility])   ? Osteoporosis   ? Portal venous hypertension (HCC)   ? Splenomegaly   ? Thrombocytopenia ([place])   ? Appears chronic from at least 2014  ? Vitamin D deficiency   ? ?Past Surgical History:  ?Procedure Laterality Date  ? arthroscopic left knee  2005  ? BREAST BIOPSY Left 2017  ? BREAST EXCISIONAL BIOPSY Right 2008  ? BREAST SURGERY  2008  ? Breast Biopsy   ? CHOLECYSTECTOMY  1985  ? COLONOSCOPY    ? IR ANGIOGRAM SELECTIVE EACH ADDITIONAL VESSEL  11/03/2018  ? IR ANGIOGRAM SELECTIVE EACH ADDITIONAL VESSEL  11/03/2018  ? IR ANGIOGRAM SELECTIVE EACH ADDITIONAL VESSEL  11/03/2018  ? IR ANGIOGRAM SELECTIVE EACH ADDITIONAL VESSEL  11/03/2018  ? IR ANGIOGRAM SELECTIVE EACH ADDITIONAL VESSEL  11/20/2018  ? IR ANGIOGRAM SELECTIVE EACH ADDITIONAL VESSEL  11/20/2018  ? IR ANGIOGRAM SELECTIVE EACH ADDITIONAL VESSEL  11/20/2018  ? IR ANGIOGRAM VISCERAL SELECTIVE  11/03/2018  ?  IR ANGIOGRAM VISCERAL SELECTIVE  11/03/2018  ? IR ANGIOGRAM VISCERAL SELECTIVE  11/03/2018  ? IR ANGIOGRAM VISCERAL SELECTIVE  11/20/2018  ? IR ANGIOGRAM VISCERAL SELECTIVE  11/20/2018  ? IR EMBO TUMOR ORGAN ISCHEMIA INFARCT INC GUIDE ROADMAPPING  11/20/2018  ? IR GENERIC HISTORICAL  04/02/2016  ? IR RADIOLOGIST EVAL & MGMT 04/02/2016 Jacqulynn Cadet, MD [facility] INTERV RAD  ? IR PARACENTESIS  02/02/2018  ? IR PARACENTESIS  04/20/2018  ? IR PARACENTESIS  12/03/2018  ? IR PARACENTESIS  07/15/2019  ? IR PARACENTESIS  02/25/2020  ? IR PARACENTESIS  06/22/2020  ? IR PARACENTESIS  08/08/2020  ? IR PARACENTESIS  09/18/2020  ? IR PARACENTESIS   12/06/2020  ? IR PARACENTESIS  03/23/2021  ? IR PARACENTESIS  09/05/2021  ? IR PARACENTESIS  09/10/2021  ? IR RADIOLOGIST EVAL & MGMT  10/29/2016  ? IR RADIOLOGIST EVAL & MGMT  04/29/2017  ? IR RADIOLOGIST EVAL & MGMT  11/04/2017  ? IR RADIOLOGIST EVAL & MGMT  04/16/2018  ? IR RADIOLOGIST EVAL & MGMT  12/08/2018  ? IR RADIOLOGIST EVAL & MGMT  03/16/2019  ? IR RADIOLOGIST EVAL & MGMT  07/08/2019  ? IR RADIOLOGIST EVAL & MGMT  10/07/2019  ? IR RADIOLOGIST EVAL & MGMT  01/05/2020  ? IR RADIOLOGIST EVAL & MGMT  03/23/2020  ? IR RADIOLOGIST EVAL & MGMT  09/26/2020  ? IR RADIOLOGIST EVAL & MGMT  04/11/2021  ? IR US GUIDE VASC ACCESS LEFT  11/20/2018  ? IR US GUIDE VASC ACCESS RIGHT  11/03/2018  ? POLYPECTOMY    ? UPPER GASTROINTESTINAL ENDOSCOPY    ? ?Social History:  reports that she quit smoking about 31 years ago. Her smoking use included cigarettes. She has never used smokeless tobacco. She reports that she does not drink alcohol and does not use drugs. ? ?No Known Allergies ? ?Family History  ?Problem Relation Age of Onset  ? Breast cancer Mother   ?     in 53's  ? Stroke Father 60  ? Goiter Maternal Grandmother   ? Colon cancer Neg Hx   ? Colon polyps Neg Hx   ? Esophageal cancer Neg Hx   ? Stomach cancer Neg Hx   ? Rectal cancer Neg Hx   ? Pancreatic cancer Neg Hx   ? ? ?Prior to Admission medications   ?Medication Sig Start Date End Date Taking? Authorizing Provider  ?Cholecalciferol (VITAMIN D) 50 MCG (2000 UT) CAPS Take 2,000 Units by mouth at bedtime.   Yes [provider]  ?fluticasone (FLONASE) 50 MCG/ACT nasal spray Place 1 spray into both nostrils daily. 08/21/21  Yes [provider]  ?furosemide (LASIX) 40 MG tablet Take 1 tablet (40 mg total) by mouth daily. 06/12/21  Yes Irene Shipper, MD  ?levothyroxine (SYNTHROID) 50 MCG tablet Take 1 tablet (50 mcg total) by mouth daily. 09/04/21  Yes Renato Shin, MD  ?metFORMIN (GLUCOPHAGE) 500 MG tablet Take 500 mg by mouth every evening. Take 1 tablet by mouth  once daily   Yes [provider]  ?montelukast (SINGULAIR) 10 MG tablet Take 10 mg by mouth at bedtime.  03/25/14  Yes [provider]  ?Multiple Vitamin (MULTIVITAMIN) tablet Take 1 tablet by mouth at bedtime.    Yes [provider]  ?pantoprazole (PROTONIX) 40 MG tablet Take 1 tablet (40 mg total) by mouth 2 (two) times daily. 06/12/21  Yes Irene Shipper, MD  ?polyethylene glycol (MIRALAX / GLYCOLAX) 17 g packet Take 8.5-17  g by mouth daily.   Yes [provider]  ?spironolactone (ALDACTONE) 100 MG tablet Take 2 tablets (200 mg total) by mouth daily. 06/12/21  Yes Irene Shipper, MD  ?vitamin C (ASCORBIC ACID) 500 MG tablet Take 500 mg by mouth daily.   Yes [provider]  ? ? ?Physical Exam: ?Vitals:  ? 09/12/21 0530 09/12/21 3267 09/12/21 1245 09/12/21 0754  ?BP: (!) 108/54 116/61 (!) 114/55 (!) 120/56  ?Pulse: 68 67 (!) 59 64  ?Resp: 15 20 17 17   ?Temp:   98.2 ?F (36.8 ?C) 97.7 ?F (36.5 ?C)  ?TempSrc:   Oral Oral  ?SpO2: 99% 98% 98% 98%  ?Weight:    75.3 kg  ?Height:    5' 4.5" (1.638 m)  ? ?Constitutional: Elderly female who appears to be in some discomfort ?Eyes: PERRL, lids and conjunctivae normal ?ENMT: Mucous membranes are dry.  ?Neck: normal, supple, no masses, no thyromegaly ?Respiratory: clear to auscultation bilaterally, no wheezing, no crackles. Normal respiratory effort. No accessory muscle use.  ?Cardiovascular: Regular rate and rhythm, no murmurs / rubs / gallops.  Trace lower extremity edema.  2+ pedal pulses.   ?Abdomen: Protuberant abdomen with umbilical hernia appreciated that is nonreducible. ?Musculoskeletal: no clubbing / cyanosis. No joint deformity upper and lower extremities. Good ROM, no contractures. Normal muscle tone.  ?Skin: no rashes, lesions, ulcers. No induration ?Neurologic: CN 2-12 grossly intact. Sensation intact, DTR normal. Strength 5/5 in all 4.  ?Psychiatric: Normal judgment and insight. Alert and oriented x 3. Normal mood.   ? ?Data Reviewed: ? ?Reviewed labs and imaging as noted above in HPI ? ?Assessment and Plan: ?Incarcerated umbilical hernia ?Acute.  Patient presented with complaints of abdominal pain with nausea and vomiting

## 2021-09-12 NOTE — H&P (Signed)
Crystal Barnett is an 81 y.o. female.   ?Chief Complaint: ab pain ?HPI: 32 yof with history of NASH cirrhosis, possible HCC with nl afp treated by IR who has had longstanding umbilical hernia. She was seen by surgery in past and deemed high risk and elected for observation.  She has really normal labs including tb, inr, cr.  She does get paracentesis every several months.  She has had two paracentesis in last week or so.  Not a lot of fluid. This was for abdominal pain.  After last one this week she has had significant ab pain. She attempted to eat yesterday am at 11 and had n/v since then. Not eating now so it has stopped.  Last bm was Monday. She states she is not having flatus.  Her hernia has gotten bigger and more painful.  She underwent a ct scan that shows fluid filled hernia sac and an incarcerated hernia with small bowel and likely dilated proximal.  I was asked to see her.Marland Kitchen ?She and her son are both present for visit.  ? ?Past Medical History:  ?Diagnosis Date  ? ALLERGIC RHINITIS 01/12/2008  ? Allergy   ? Cancer [facility])   ? liver cancer hepatocellular carcinoma per pt   ? Cataract   ? bilateral  ? Colon polyps   ? hyperplastic  ? Constipation   ? ongoing issues per pt  ? Diabetes ([place])   ? Esophageal varices (HCC)   ? Fatty liver   ? GOITER, MULTINODULAR 01/12/2008  ? Hepatic cirrhosis ([place])   ? History of radiation therapy 11/20/2018  ? x 1 radiation for liver cancer  ? Hypertension   ? under control  ? HYPOTHYROIDISM 09/07/2008  ? Lichen planus   ? In the mouth, Notes that she's had this for at least 20 years  ? Liver cirrhosis secondary to NASH [facility])   ? Osteoporosis   ? Portal venous hypertension (HCC)   ? Splenomegaly   ? Thrombocytopenia ([place])   ? Appears chronic from at least 2014  ? Vitamin D deficiency   ? ? ?Past Surgical History:  ?Procedure Laterality Date  ? arthroscopic left knee  2005  ? BREAST BIOPSY Left 2017  ? BREAST EXCISIONAL BIOPSY Right 2008  ? BREAST SURGERY  2008  ? Breast Biopsy   ?  CHOLECYSTECTOMY  1985  ? COLONOSCOPY    ? IR ANGIOGRAM SELECTIVE EACH ADDITIONAL VESSEL  11/03/2018  ? IR ANGIOGRAM SELECTIVE EACH ADDITIONAL VESSEL  11/03/2018  ? IR ANGIOGRAM SELECTIVE EACH ADDITIONAL VESSEL  11/03/2018  ? IR ANGIOGRAM SELECTIVE EACH ADDITIONAL VESSEL  11/03/2018  ? IR ANGIOGRAM SELECTIVE EACH ADDITIONAL VESSEL  11/20/2018  ? IR ANGIOGRAM SELECTIVE EACH ADDITIONAL VESSEL  11/20/2018  ? IR ANGIOGRAM SELECTIVE EACH ADDITIONAL VESSEL  11/20/2018  ? IR ANGIOGRAM VISCERAL SELECTIVE  11/03/2018  ? IR ANGIOGRAM VISCERAL SELECTIVE  11/03/2018  ? IR ANGIOGRAM VISCERAL SELECTIVE  11/03/2018  ? IR ANGIOGRAM VISCERAL SELECTIVE  11/20/2018  ? IR ANGIOGRAM VISCERAL SELECTIVE  11/20/2018  ? IR EMBO TUMOR ORGAN ISCHEMIA INFARCT INC GUIDE ROADMAPPING  11/20/2018  ? IR GENERIC HISTORICAL  04/02/2016  ? IR RADIOLOGIST EVAL & MGMT 04/02/2016 Jacqulynn Cadet, MD [facility] INTERV RAD  ? IR PARACENTESIS  02/02/2018  ? IR PARACENTESIS  04/20/2018  ? IR PARACENTESIS  12/03/2018  ? IR PARACENTESIS  07/15/2019  ? IR PARACENTESIS  02/25/2020  ? IR PARACENTESIS  06/22/2020  ? IR PARACENTESIS  08/08/2020  ? IR PARACENTESIS  09/18/2020  ?  IR PARACENTESIS  12/06/2020  ? IR PARACENTESIS  03/23/2021  ? IR PARACENTESIS  09/05/2021  ? IR PARACENTESIS  09/10/2021  ? IR RADIOLOGIST EVAL & MGMT  10/29/2016  ? IR RADIOLOGIST EVAL & MGMT  04/29/2017  ? IR RADIOLOGIST EVAL & MGMT  11/04/2017  ? IR RADIOLOGIST EVAL & MGMT  04/16/2018  ? IR RADIOLOGIST EVAL & MGMT  12/08/2018  ? IR RADIOLOGIST EVAL & MGMT  03/16/2019  ? IR RADIOLOGIST EVAL & MGMT  07/08/2019  ? IR RADIOLOGIST EVAL & MGMT  10/07/2019  ? IR RADIOLOGIST EVAL & MGMT  01/05/2020  ? IR RADIOLOGIST EVAL & MGMT  03/23/2020  ? IR RADIOLOGIST EVAL & MGMT  09/26/2020  ? IR RADIOLOGIST EVAL & MGMT  04/11/2021  ? IR US GUIDE VASC ACCESS LEFT  11/20/2018  ? IR US GUIDE VASC ACCESS RIGHT  11/03/2018  ? POLYPECTOMY    ? UPPER GASTROINTESTINAL ENDOSCOPY    ? ? ?Family History  ?Problem Relation Age of Onset  ? Breast cancer  Mother   ?     in 67's  ? Stroke Father 21  ? Goiter Maternal Grandmother   ? Colon cancer Neg Hx   ? Colon polyps Neg Hx   ? Esophageal cancer Neg Hx   ? Stomach cancer Neg Hx   ? Rectal cancer Neg Hx   ? Pancreatic cancer Neg Hx   ? ?Social History:  reports that she quit smoking about 31 years ago. Her smoking use included cigarettes. She has never used smokeless tobacco. She reports that she does not drink alcohol and does not use drugs. ? ?Allergies: No Known Allergies ? ?Current Facility-Administered Medications on File Prior to Encounter  ?Medication Dose Route Frequency Provider Last Rate Last Admin  ? albumin human 25 % solution 12.5 g  12.5 g Intravenous Once Irene Shipper, MD      ? ?Current Outpatient Medications on File Prior to Encounter  ?Medication Sig Dispense Refill  ? Cholecalciferol (VITAMIN D) 50 MCG (2000 UT) CAPS Take 2,000 Units by mouth at bedtime.    ? fluticasone (FLONASE) 50 MCG/ACT nasal spray Place 1 spray into both nostrils daily.    ? furosemide (LASIX) 40 MG tablet Take 1 tablet (40 mg total) by mouth daily. 90 tablet 3  ? levothyroxine (SYNTHROID) 50 MCG tablet Take 1 tablet (50 mcg total) by mouth daily. 90 tablet 1  ? metFORMIN (GLUCOPHAGE) 500 MG tablet Take 500 mg by mouth every evening. Take 1 tablet by mouth once daily    ? montelukast (SINGULAIR) 10 MG tablet Take 10 mg by mouth at bedtime.     ? Multiple Vitamin (MULTIVITAMIN) tablet Take 1 tablet by mouth at bedtime.     ? pantoprazole (PROTONIX) 40 MG tablet Take 1 tablet (40 mg total) by mouth 2 (two) times daily. 180 tablet 3  ? polyethylene glycol (MIRALAX / GLYCOLAX) 17 g packet Take 8.5-17 g by mouth daily.    ? spironolactone (ALDACTONE) 100 MG tablet Take 2 tablets (200 mg total) by mouth daily. 180 tablet 3  ? vitamin C (ASCORBIC ACID) 500 MG tablet Take 500 mg by mouth daily.    ? ? ? ?Results for orders placed or performed during the hospital encounter of 09/11/21 (from the past 48 hour(s))  ?Urinalysis, Routine  w reflex microscopic Urine, Clean Catch     Status: Abnormal  ? Collection Time: 09/11/21  2:36 AM  ?Result Value Ref Range  ? Color, Urine  YELLOW YELLOW  ? APPearance CLEAR CLEAR  ? Specific Gravity, Urine 1.032 (H) 1.005 - 1.030  ? pH 6.0 5.0 - 8.0  ? Glucose, UA NEGATIVE NEGATIVE mg/dL  ? Hgb urine dipstick NEGATIVE NEGATIVE  ? Bilirubin Urine NEGATIVE NEGATIVE  ? Ketones, ur NEGATIVE NEGATIVE mg/dL  ? Protein, ur NEGATIVE NEGATIVE mg/dL  ? Nitrite NEGATIVE NEGATIVE  ? Leukocytes,Ua NEGATIVE NEGATIVE  ?  Comment: Performed at [facility], [place] [address], [place], [place] [address]  ?Lipase, blood     Status: None  ? Collection Time: 09/11/21  4:33 PM  ?Result Value Ref Range  ? Lipase 30 11 - 51 U/L  ?  Comment: Performed at [facility], [place] [address], [place], [place] [address]  ?Comprehensive metabolic panel     Status: Abnormal  ? Collection Time: 09/11/21  4:33 PM  ?Result Value Ref Range  ? Sodium 138 135 - 145 mmol/L  ? Potassium 4.3 3.5 - 5.1 mmol/L  ? Chloride 106 98 - 111 mmol/L  ? CO2 20 (L) 22 - 32 mmol/L  ? Glucose, Bld 122 (H) 70 - 99 mg/dL  ?  Comment: Glucose reference range applies only to samples taken after fasting for at least 8 hours.  ? BUN 23 8 - 23 mg/dL  ? Creatinine, Ser 0.90 0.44 - 1.00 mg/dL  ? Calcium 9.5 8.9 - 10.3 mg/dL  ? Total Protein 7.5 6.5 - 8.1 g/dL  ? Albumin 4.0 3.5 - 5.0 g/dL  ? AST 32 15 - 41 U/L  ? ALT 19 0 - 44 U/L  ? Alkaline Phosphatase 82 38 - 126 U/L  ? Total Bilirubin 0.8 0.3 - 1.2 mg/dL  ? GFR, Estimated >60 >60 mL/min  ?  Comment: (NOTE) ?Calculated using the CKD-EPI Creatinine Equation (2021) ?  ? Anion gap 12 5 - 15  ?  Comment: Performed at [facility], [place] [address] [place], [place] [address]  ?CBC     Status: Abnormal  ? Collection Time: 09/11/21  4:33 PM  ?Result Value Ref Range  ? WBC 4.6 4.0 - 10.5 K/uL  ? RBC 4.53 3.87 - 5.11 MIL/uL  ? Hemoglobin 14.4 12.0 - 15.0 g/dL  ? HCT 41.8 36.0 - 46.0 %   ? MCV 92.3 80.0 - 100.0 fL  ? MCH 31.8 26.0 - 34.0 pg  ? MCHC 34.4 30.0 - 36.0 g/dL  ? RDW 13.2 11.5 - 15.5 %  ? Platelets 127 (L) 150 - 400 K/uL  ?  Comment: SPECIMEN CHECKED FOR CLOTS ?CONSISTENT WITH PREV

## 2021-09-13 ENCOUNTER — Encounter (HOSPITAL_COMMUNITY): Payer: Self-pay | Admitting: Surgery

## 2021-09-13 DIAGNOSIS — K460 Unspecified abdominal hernia with obstruction, without gangrene: Secondary | ICD-10-CM | POA: Diagnosis not present

## 2021-09-13 LAB — COMPREHENSIVE METABOLIC PANEL
ALT: 21 U/L (ref 0–44)
AST: 34 U/L (ref 15–41)
Albumin: 3.2 g/dL — ABNORMAL LOW (ref 3.5–5.0)
Alkaline Phosphatase: 69 U/L (ref 38–126)
Anion gap: 7 (ref 5–15)
BUN: 23 mg/dL (ref 8–23)
CO2: 22 mmol/L (ref 22–32)
Calcium: 8.9 mg/dL (ref 8.9–10.3)
Chloride: 105 mmol/L (ref 98–111)
Creatinine, Ser: 0.99 mg/dL (ref 0.44–1.00)
GFR, Estimated: 57 mL/min — ABNORMAL LOW (ref >60)
Glucose, Bld: 117 mg/dL — ABNORMAL HIGH (ref 70–99)
Potassium: 4.8 mmol/L (ref 3.5–5.1)
Sodium: 134 mmol/L — ABNORMAL LOW (ref 135–145)
Total Bilirubin: 1.1 mg/dL (ref 0.3–1.2)
Total Protein: 5.9 g/dL — ABNORMAL LOW (ref 6.5–8.1)

## 2021-09-13 LAB — CBC
HCT: 37.6 % (ref 36.0–46.0)
Hemoglobin: 12.6 g/dL (ref 12.0–15.0)
MCH: 30.7 pg (ref 26.0–34.0)
MCHC: 33.5 g/dL (ref 30.0–36.0)
MCV: 91.7 fL (ref 80.0–100.0)
Platelets: 115 10*3/uL — ABNORMAL LOW (ref 150–400)
RBC: 4.1 MIL/uL (ref 3.87–5.11)
RDW: 13.1 % (ref 11.5–15.5)
WBC: 6.9 10*3/uL (ref 4.0–10.5)
nRBC: 0 % (ref 0.0–0.2)

## 2021-09-13 MED ORDER — METHOCARBAMOL 1000 MG/10ML IJ SOLN
500.0000 mg | Freq: Four times a day (QID) | INTRAVENOUS | Status: DC | PRN
  Filled 2021-09-13: qty 5

## 2021-09-13 MED ORDER — SPIRONOLACTONE 100 MG PO TABS
200.0000 mg | ORAL_TABLET | Freq: Every day | ORAL | Status: DC
  Administered 2021-09-13 – 2021-09-15 (×3): 200 mg via ORAL
  Filled 2021-09-13 (×3): qty 2

## 2021-09-13 MED ORDER — LEVOTHYROXINE SODIUM 50 MCG PO TABS
50.0000 ug | ORAL_TABLET | Freq: Every day | ORAL | Status: DC
  Administered 2021-09-13 – 2021-09-20 (×8): 50 ug via ORAL
  Filled 2021-09-13 (×8): qty 1

## 2021-09-13 MED ORDER — FUROSEMIDE 40 MG PO TABS
40.0000 mg | ORAL_TABLET | Freq: Every day | ORAL | Status: DC
  Administered 2021-09-13 – 2021-09-20 (×8): 40 mg via ORAL
  Filled 2021-09-13 (×8): qty 1

## 2021-09-13 MED ORDER — MONTELUKAST SODIUM 10 MG PO TABS
10.0000 mg | ORAL_TABLET | Freq: Every day | ORAL | Status: DC
  Administered 2021-09-13 – 2021-09-20 (×8): 10 mg via ORAL
  Filled 2021-09-13 (×8): qty 1

## 2021-09-13 MED ORDER — PANTOPRAZOLE SODIUM 40 MG PO TBEC
40.0000 mg | DELAYED_RELEASE_TABLET | Freq: Two times a day (BID) | ORAL | Status: DC
  Administered 2021-09-13 – 2021-09-20 (×16): 40 mg via ORAL
  Filled 2021-09-13 (×16): qty 1

## 2021-09-13 NOTE — Progress Notes (Signed)
Mobility Specialist Progress Note: ? ? 09/13/21 1308  ?Mobility  ?Activity Transferred to/from [facility]  ?Level of Assistance Standby assist, set-up cues, supervision of patient - no hands on  ?Assistive Device BSC  ?Distance Ambulated (ft) 4 ft  ?Activity Response Tolerated well  ?$Mobility charge 1 Mobility  ? ?Pt received in bed asking to use BSC. Pt educated on log rolling to reduce stomach pain. Complaints of abdominal pain. Left in bed with call bell in reach and all needs met.  ? ?Crystal Barnett ?Mobility Specialist ?Primary Phone [phone] ? ?

## 2021-09-13 NOTE — Progress Notes (Signed)
?PROGRESS NOTE ? ?Crystal Barnett  KPT:[id number] DOB: November 28, 1940 DOA: 09/11/2021 ?PCP: Vernie Shanks, MD  ? ?Brief Narrative: ?Patient is a 81 year old female with history of hypertension, [place] cirrhosis, thrombocytopenia, diabetes type 2, hypothyroidism, GERD umbilical hernia who presented with acute onset of abdominal pain, nausea, vomiting, lack of bowel movement.  On presentation she was hemodynamically stable.  CT abdomen/pelvis showed large umbilical hernia containing ascites, dilated small bowel with a transition point for small bowel obstruction, concern for strangulated/incarcerated hernia.  Patient was started on IV fluids, IV antibiotics, pain medications.  General surgery consulted.  Underwent umbilical hernia repair, small bowel resection on 5/3.  Currently awaiting return of bowel function. ? ? ?Assessment & Plan: ? ?Principal Problem: ?  Incarcerated hernia ?Active Problems: ?  Hypothyroidism ?  Thrombocytopenia ([place]) ?  Liver cirrhosis secondary to NASH [facility]) ?  Controlled type 2 diabetes mellitus without complication, without long-term current use of insulin ([place]) ?  GERD (gastroesophageal reflux disease) ? ? ?Incarcerated umbilical hernia: Presented with abdomen pain, nausea, vomiting, lack of bowel movement.CT abdomen/pelvis showed large umbilical hernia containing ascites, dilated small bowel with a transition point for small bowel obstruction, concern for strangulated/incarcerated hernia.  Patient was started on IV fluids, IV antibiotics, pain medications.  General surgery consulted.  Underwent umbilical hernia repair, small bowel resection on 5/3.  Currently awaiting return of bowel function. ?Continue supportive care, pain management.  General surgery following ? ?NASH cirrhosis/ascites: Undergoes regular paracentesis as an outpatient.  Takes spironolactone, furosemide at home.  We will check if she needs paracentesis here to promote wound healing/prevent abdominal  distention. ? ?Thrombocytopenia: Chronic, stable.  Secondary to [name] ? ?Diabetes type 2: Well-controlled.  Takes metformin 500 mg daily at home.  Continue sliding scale insulin here.  Monitor blood sugars ? ?GERD: Continue Protonix ? ?Hypothyroidism: Continue levothyroxine ? ?Debility/deconditioning: PT consulted ? ? ?  ? ? ?  ?  ? ?DVT prophylaxis:enoxaparin (LOVENOX) injection 40 mg Start: 09/13/21 0800 ? ? ?  Code Status: Full Code ? ?Family Communication: Called son today,call not received ? ?Patient status:Inpatient ? ?Patient is from :Home ? ?Anticipated discharge TZ:GYFV ? ?Estimated DC date:After surgical clearance, return of bowel function ? ? ?Consultants: Surgery ? ?Procedures: Hernia repair, small bowel resection ? ?Antimicrobials:  ?Anti-infectives (From admission, onward)  ? ? Start     Dose/Rate Route Frequency Ordered Stop  ? 09/12/21 0545  ceFAZolin (ANCEF) IVPB 2g/100 mL premix       ? 2 g ?200 mL/hr over 30 Minutes Intravenous  Once 09/12/21 0539 09/12/21 0737  ? ?  ? ? ?Subjective: ?Patient seen and examined at the bedside this morning.  Hemodynamically stable.  Appears overall comfortable.  Complains of some pain on the operated site, no passage of gas or bowel movement. ? ?Objective: ?Vitals:  ? 09/12/21 2000 09/13/21 0000 09/13/21 0442 09/13/21 0802  ?BP: 135/74 130/68 131/66 119/64  ?Pulse: 79 80 82 84  ?Resp: 17 16 17 18   ?Temp: 98.1 ?F (36.7 ?C) 98.2 ?F (36.8 ?C) 98.3 ?F (36.8 ?C) 97.8 ?F (36.6 ?C)  ?TempSrc: Oral Oral Oral   ?SpO2: 96% 97% 98% 97%  ?Weight:      ?Height:      ? ? ?Intake/Output Summary (Last 24 hours) at 09/13/2021 1318 ?Last data filed at 09/13/2021 0013 ?Gross per 24 hour  ?Intake --  ?Output 200 ml  ?Net -200 ml  ? ?Filed Weights  ? 09/11/21 1550 09/12/21 0754  ?Weight: 75.3 kg 75.3  kg  ? ? ?Examination: ? ?General exam: Overall comfortable, not in distress, pleasant elderly female ?HEENT: PERRL ?Respiratory system:  no wheezes or crackles  ?Cardiovascular system: S1 & S2  heard, RRR.  ?Gastrointestinal system: Abdomen is wrapped with abdominal binder, surgical dressings.  Bowel sounds were heard ?Central nervous system: Alert and oriented ?Extremities: No edema, no clubbing ,no cyanosis ?Skin: No rashes, no ulcers,no icterus   ? ? ?Data Reviewed: I have personally reviewed following labs and imaging studies ? ?CBC: ?Recent Labs  ?Lab 09/11/21 ?1633 09/13/21 ?1610  ?WBC 4.6 6.9  ?HGB 14.4 12.6  ?HCT 41.8 37.6  ?MCV 92.3 91.7  ?PLT 127* 115*  ? ?Basic Metabolic Panel: ?Recent Labs  ?Lab 09/11/21 ?1633 09/13/21 ?9604  ?NA 138 134*  ?K 4.3 4.8  ?CL 106 105  ?CO2 20* 22  ?GLUCOSE 122* 117*  ?BUN 23 23  ?CREATININE 0.90 0.99  ?CALCIUM 9.5 8.9  ? ? ? ?Recent Results (from the past 240 hour(s))  ?Culture, body fluid w Gram Stain-bottle     Status: None (Preliminary result)  ? Collection Time: 09/10/21 11:56 AM  ? Specimen: Peritoneal Washings  ?Result Value Ref Range Status  ? Specimen Description PERITONEAL  Final  ? Special Requests NONE  Final  ? Culture   Final  ?  NO GROWTH 3 DAYS ?Performed at [facility], [place] [address], [place], [place] [address] ?  ? Report Status PENDING  Incomplete  ?Gram stain     Status: None  ? Collection Time: 09/10/21 11:56 AM  ? Specimen: Peritoneal Washings  ?Result Value Ref Range Status  ? Specimen Description PERITONEAL  Final  ? Special Requests NONE  Final  ? Gram Stain   Final  ?  NO WBC SEEN ?NO ORGANISMS SEEN ?Performed at [facility], [place] [address], [place], [place] [address] ?  ? Report Status 09/10/2021 FINAL  Final  ?  ? ?Radiology Studies: ?CT ABDOMEN PELVIS W CONTRAST ? ?Result Date: 09/12/2021 ?CLINICAL DATA:  Acute abdominal pain. Status post paracentesis. History of hepatocellular carcinoma. EXAM: CT ABDOMEN AND PELVIS WITH CONTRAST TECHNIQUE: Multidetector CT imaging of the abdomen and pelvis was performed using the standard protocol following bolus administration of intravenous contrast. RADIATION DOSE REDUCTION: This exam  was performed according to the departmental dose-optimization program which includes automated exposure control, adjustment of the mA and/or kV according to patient size and/or use of iterative reconstruction technique. CONTRAST:  159m OMNIPAQUE IOHEXOL 300 MG/ML  SOLN COMPARISON:  CT abdomen and pelvis 07/24/2018. MRI abdomen 07/05/2019. FINDINGS: Lower chest: There is a calcified granuloma in the left lower lung. Lung bases are otherwise clear. Hepatobiliary: Again seen is nodular liver contour compatible with cirrhosis. Gallbladder surgically absent. No biliary ductal dilatation. There is a new punctate density in the region of patient's known right hepatic lesion. This hepatic lesion is not well defined on this study. No new hepatic lesions are identified. Pancreas: Unremarkable. No pancreatic ductal dilatation or surrounding inflammatory changes. Spleen: Calcified granulomas are again seen. Spleen is mildly enlarged, unchanged. Adrenals/Urinary Tract: Adrenal glands are unremarkable. Kidneys are normal, without renal calculi, focal lesion, or hydronephrosis. Bladder is unremarkable. Stomach/Bowel: There is a large umbilical hernia containing a single loop of dilated small bowel. This is transition point for small bowel obstruction. Small bowel loops proximal to this level are mildly dilated with air-fluid levels. The stomach is nondilated. Distal small bowel and colon are nondilated. The appendix is not visualized. No pneumatosis  or free air. Vascular/Lymphatic: Aortic atherosclerosis. No enlarged abdominal or pelvic lymph nodes. Reproductive: Fibroid uterus is present. Largest fibroid measures 4.4 cm. Ovaries are not well delineated on this study. No adnexal masses are seen. Other: Small volume ascites. There is a large umbilical hernia containing ascites. Wall defect measures 1.9 by 1.8 cm. Hernia sac measures 11 by 7.4 by 13.5 cm. This is increased in size. Musculoskeletal: Bones are osteopenic. There are  degenerative changes of the lower lumbar spine. IMPRESSION: 1. Large umbilical hernia containing ascites and dilated small bowel. This is transition point for small bowel obstruction. Findings are concerning for strangulated or incar

## 2021-09-13 NOTE — Progress Notes (Signed)
Patient ID: Crystal Barnett, female   DOB: 03-Nov-1940, 81 y.o.   MRN: [id number] ?[facility] ?Progress Note ? ?1 Day Post-Op  ?Subjective: ?CC-  ?Pain well controlled after surgery. Some nausea, no emesis. No flatus or BM.  ? ?Objective: ?Vital signs in last 24 hours: ?Temp:  [97.6 ?F (36.4 ?C)-98.4 ?F (36.9 ?C)] 97.8 ?F (36.6 ?C) (05/04 0802) ?Pulse Rate:  [65-84] 84 (05/04 0802) ?Resp:  [13-18] 18 (05/04 0802) ?BP: (119-135)/(53-74) 119/64 (05/04 0802) ?SpO2:  [95 %-100 %] 97 % (05/04 0802) ?Last BM Date : 09/10/21 ? ?Intake/Output from previous day: ?05/03 0701 - 05/04 0700 ?In: 1400 [I.V.:1000; IV Piggyback:250] ?Out: 275 [Urine:200; Blood:75] ?Intake/Output this shift: ?No intake/output data recorded. ? ?PE: ?Gen:  Alert, NAD, pleasant ?Abd: soft, mild distension, mild global tenderness without rebound or guarding, hypoactive BS, incision cdi ? ?Lab Results:  ?Recent Labs  ?  09/11/21 ?0272 09/13/21 ?5366  ?WBC 4.6 6.9  ?HGB 14.4 12.6  ?HCT 41.8 37.6  ?PLT 127* 115*  ? ?BMET ?Recent Labs  ?  09/11/21 ?1633 09/13/21 ?4403  ?NA 138 134*  ?K 4.3 4.8  ?CL 106 105  ?CO2 20* 22  ?GLUCOSE 122* 117*  ?BUN 23 23  ?CREATININE 0.90 0.99  ?CALCIUM 9.5 8.9  ? ?PT/INR ?Recent Labs  ?  09/12/21 ?0432  ?LABPROT 14.6  ?INR 1.2  ? ?CMP  ?   ?Component Value Date/Time  ? NA 134 (L) 09/13/2021 0051  ? NA 140 12/02/2016 0949  ? K 4.8 09/13/2021 0051  ? K 4.4 12/02/2016 0949  ? CL 105 09/13/2021 0051  ? CO2 22 09/13/2021 0051  ? CO2 24 12/02/2016 0949  ? GLUCOSE 117 (H) 09/13/2021 0051  ? GLUCOSE 151 (H) 12/02/2016 0949  ? BUN 23 09/13/2021 0051  ? BUN 9.8 12/02/2016 0949  ? CREATININE 0.99 09/13/2021 0051  ? CREATININE 0.97 11/30/2020 0913  ? CREATININE 0.61 04/18/2017 1210  ? CREATININE 0.7 12/02/2016 0949  ? CALCIUM 8.9 09/13/2021 0051  ? CALCIUM 8.7 12/02/2016 0949  ? PROT 5.9 (L) 09/13/2021 0051  ? PROT 6.3 (L) 12/02/2016 0949  ? ALBUMIN 3.2 (L) 09/13/2021 0051  ? ALBUMIN 3.5 12/02/2016 0949  ? AST 34 09/13/2021 0051   ? AST 29 11/30/2020 0913  ? AST 34 12/02/2016 0949  ? ALT 21 09/13/2021 0051  ? ALT 17 11/30/2020 0913  ? ALT 29 12/02/2016 0949  ? ALKPHOS 69 09/13/2021 0051  ? ALKPHOS 62 12/02/2016 0949  ? BILITOT 1.1 09/13/2021 0051  ? BILITOT 0.8 11/30/2020 0913  ? BILITOT 0.60 12/02/2016 0949  ? GFRNONAA 57 (L) 09/13/2021 0051  ? GFRNONAA 59 (L) 11/30/2020 0913  ? GFRNONAA 87 04/18/2017 1210  ? GFRAA 55 (L) 12/01/2019 1319  ? GFRAA 101 04/18/2017 1210  ? ?Lipase  ?   ?Component Value Date/Time  ? LIPASE 30 09/11/2021 1633  ? ? ? ? ? ?Studies/Results: ?CT ABDOMEN PELVIS W CONTRAST ? ?Result Date: 09/12/2021 ?CLINICAL DATA:  Acute abdominal pain. Status post paracentesis. History of hepatocellular carcinoma. EXAM: CT ABDOMEN AND PELVIS WITH CONTRAST TECHNIQUE: Multidetector CT imaging of the abdomen and pelvis was performed using the standard protocol following bolus administration of intravenous contrast. RADIATION DOSE REDUCTION: This exam was performed according to the departmental dose-optimization program which includes automated exposure control, adjustment of the mA and/or kV according to patient size and/or use of iterative reconstruction technique. CONTRAST:  164m OMNIPAQUE IOHEXOL 300 MG/ML  SOLN COMPARISON:  CT abdomen and pelvis 07/24/2018.  MRI abdomen 07/05/2019. FINDINGS: Lower chest: There is a calcified granuloma in the left lower lung. Lung bases are otherwise clear. Hepatobiliary: Again seen is nodular liver contour compatible with cirrhosis. Gallbladder surgically absent. No biliary ductal dilatation. There is a new punctate density in the region of patient's known right hepatic lesion. This hepatic lesion is not well defined on this study. No new hepatic lesions are identified. Pancreas: Unremarkable. No pancreatic ductal dilatation or surrounding inflammatory changes. Spleen: Calcified granulomas are again seen. Spleen is mildly enlarged, unchanged. Adrenals/Urinary Tract: Adrenal glands are unremarkable.  Kidneys are normal, without renal calculi, focal lesion, or hydronephrosis. Bladder is unremarkable. Stomach/Bowel: There is a large umbilical hernia containing a single loop of dilated small bowel. This is transition point for small bowel obstruction. Small bowel loops proximal to this level are mildly dilated with air-fluid levels. The stomach is nondilated. Distal small bowel and colon are nondilated. The appendix is not visualized. No pneumatosis or free air. Vascular/Lymphatic: Aortic atherosclerosis. No enlarged abdominal or pelvic lymph nodes. Reproductive: Fibroid uterus is present. Largest fibroid measures 4.4 cm. Ovaries are not well delineated on this study. No adnexal masses are seen. Other: Small volume ascites. There is a large umbilical hernia containing ascites. Wall defect measures 1.9 by 1.8 cm. Hernia sac measures 11 by 7.4 by 13.5 cm. This is increased in size. Musculoskeletal: Bones are osteopenic. There are degenerative changes of the lower lumbar spine. IMPRESSION: 1. Large umbilical hernia containing ascites and dilated small bowel. This is transition point for small bowel obstruction. Findings are concerning for strangulated or incarcerated hernia. No free air. 2. Hepatic cirrhosis. There is a focal density which may represent calcification or clip in the region of patient's known right hepatic lesion. This lesion is not well defined on the current study. 3. Stable splenomegaly. 4. Small volume ascites. 5. Fibroid uterus. Electronically Signed   By: Ronney Asters M.D.   On: 09/12/2021 02:19   ? ?Anti-infectives: ?Anti-infectives (From admission, onward)  ? ? Start     Dose/Rate Route Frequency Ordered Stop  ? 09/12/21 0545  ceFAZolin (ANCEF) IVPB 2g/100 mL premix       ? 2 g ?200 mL/hr over 30 Minutes Intravenous  Once 09/12/21 0539 09/12/21 0737  ? ?  ? ? ? ?Assessment/Plan ?Incarcerated umbilical hernia (with 1 cm x 1 cm fascial defect) with strangulated, necrotic small intestine ?-POD#1  S/p UMBILICAL HERNIA REPAIR, SMALL BOWEL RESECTION: [id number] 5/3 Stechschulte ?- Continue just sips/ice chips and hard candy, await return in bowel function ?- mobilize, will ask PT to see ?- we need to keep volume ascites down to assist wound healing, plan to have IR perform paracentesis tomorrow if ascites accumulating ? ?ID - ancef periop ?FEN - IVF, NPO ?VTE - ok for chemical dvt ppx from surgical standpoint ?Foley - none ? ?NASH cirrhosis ?Thrombocytopenia ?DM ?Hypothyroidism ?GERD ? ? LOS: 1 day  ? ? ?Wellington Hampshire, PA-C ?[facility] ?09/13/2021, 9:59 AM ?Please see Amion for pager number during day hours 7:00am-4:30pm ? ?

## 2021-09-14 DIAGNOSIS — K420 Umbilical hernia with obstruction, without gangrene: Secondary | ICD-10-CM

## 2021-09-14 DIAGNOSIS — K7460 Unspecified cirrhosis of liver: Secondary | ICD-10-CM | POA: Diagnosis not present

## 2021-09-14 DIAGNOSIS — E875 Hyperkalemia: Secondary | ICD-10-CM

## 2021-09-14 DIAGNOSIS — R188 Other ascites: Secondary | ICD-10-CM

## 2021-09-14 DIAGNOSIS — K219 Gastro-esophageal reflux disease without esophagitis: Secondary | ICD-10-CM | POA: Diagnosis not present

## 2021-09-14 DIAGNOSIS — E119 Type 2 diabetes mellitus without complications: Secondary | ICD-10-CM | POA: Diagnosis not present

## 2021-09-14 LAB — CBC
HCT: 33.4 % — ABNORMAL LOW (ref 36.0–46.0)
Hemoglobin: 11.2 g/dL — ABNORMAL LOW (ref 12.0–15.0)
MCH: 31.3 pg (ref 26.0–34.0)
MCHC: 33.5 g/dL (ref 30.0–36.0)
MCV: 93.3 fL (ref 80.0–100.0)
Platelets: 105 10*3/uL — ABNORMAL LOW (ref 150–400)
RBC: 3.58 MIL/uL — ABNORMAL LOW (ref 3.87–5.11)
RDW: 13.2 % (ref 11.5–15.5)
WBC: 7.7 10*3/uL (ref 4.0–10.5)
nRBC: 0 % (ref 0.0–0.2)

## 2021-09-14 LAB — HEPATIC FUNCTION PANEL
ALT: 13 U/L (ref 0–44)
AST: 29 U/L (ref 15–41)
Albumin: 3 g/dL — ABNORMAL LOW (ref 3.5–5.0)
Alkaline Phosphatase: 59 U/L (ref 38–126)
Bilirubin, Direct: 0.4 mg/dL — ABNORMAL HIGH (ref 0.0–0.2)
Indirect Bilirubin: 1.2 mg/dL — ABNORMAL HIGH (ref 0.3–0.9)
Total Bilirubin: 1.6 mg/dL — ABNORMAL HIGH (ref 0.3–1.2)
Total Protein: 5.7 g/dL — ABNORMAL LOW (ref 6.5–8.1)

## 2021-09-14 LAB — SURGICAL PATHOLOGY

## 2021-09-14 LAB — BASIC METABOLIC PANEL
Anion gap: 10 (ref 5–15)
BUN: 28 mg/dL — ABNORMAL HIGH (ref 8–23)
CO2: 21 mmol/L — ABNORMAL LOW (ref 22–32)
Calcium: 8.9 mg/dL (ref 8.9–10.3)
Chloride: 104 mmol/L (ref 98–111)
Creatinine, Ser: 0.88 mg/dL (ref 0.44–1.00)
GFR, Estimated: >60 mL/min (ref >60)
Glucose, Bld: 83 mg/dL (ref 70–99)
Potassium: 5.4 mmol/L — ABNORMAL HIGH (ref 3.5–5.1)
Sodium: 135 mmol/L (ref 135–145)

## 2021-09-14 MED ORDER — SODIUM ZIRCONIUM CYCLOSILICATE 10 G PO PACK
10.0000 g | PACK | Freq: Once | ORAL | Status: AC
  Administered 2021-09-14: 10 g via ORAL
  Filled 2021-09-14: qty 1

## 2021-09-14 NOTE — Assessment & Plan Note (Signed)
Continue PPI ?

## 2021-09-14 NOTE — Care Management Important Message (Signed)
Important Message ? ?Patient Details  ?Name: Crystal Barnett ?MRN: [id number] ?Date of Birth: Dec 19, 1940 ? ? ?Medicare Important Message Given:  Yes ? ? ? ? ?Keyon Winnick ?09/14/2021, 4:38 PM ?

## 2021-09-14 NOTE — Plan of Care (Signed)
  Problem: Health Behavior/Discharge Planning: Goal: Ability to manage health-related needs will improve Outcome: Progressing   

## 2021-09-14 NOTE — Assessment & Plan Note (Addendum)
Resolved

## 2021-09-14 NOTE — Progress Notes (Signed)
Patient ID: Crystal Barnett, female   DOB: [birth year], 81 y.o.   MRN: [id number] ?[facility] ?Progress Note ? ?2 Days Post-Op  ?Subjective: ?CC-  ?Feeling a little better today. Pain well controlled. Started passing a little flatus this morning. No BM. Denies n/v.  ? ?Objective: ?Vital signs in last 24 hours: ?Temp:  [98.1 ?F (36.7 ?C)-98.5 ?F (36.9 ?C)] 98.2 ?F (36.8 ?C) (05/05 9833) ?Pulse Rate:  [81-84] 81 (05/05 0837) ?Resp:  [18] 18 (05/05 0837) ?BP: (113-121)/(58-64) 115/58 (05/05 8250) ?SpO2:  [95 %-98 %] 98 % (05/05 0837) ?Weight:  [71.4 kg] 71.4 kg (05/05 0437) ?Last BM Date : 09/10/21 ? ?Intake/Output from previous day: ?05/04 0701 - 05/05 0700 ?In: 94 [P.O.:50] ?Out: 250 [Urine:250] ?Intake/Output this shift: ?No intake/output data recorded. ? ?PE: ?Gen:  Alert, NAD, pleasant ?Abd: soft, mild distension, mild global tenderness without rebound or guarding, few BS heard, incision cdi ? ?Lab Results:  ?Recent Labs  ?  09/13/21 ?5397 09/14/21 ?0117  ?WBC 6.9 7.7  ?HGB 12.6 11.2*  ?HCT 37.6 33.4*  ?PLT 115* 105*  ? ?BMET ?Recent Labs  ?  09/13/21 ?6734 09/14/21 ?0117  ?NA 134* 135  ?K 4.8 5.4*  ?CL 105 104  ?CO2 22 21*  ?GLUCOSE 117* 83  ?BUN 23 28*  ?CREATININE 0.99 0.88  ?CALCIUM 8.9 8.9  ? ?PT/INR ?Recent Labs  ?  09/12/21 ?0432  ?LABPROT 14.6  ?INR 1.2  ? ?CMP  ?   ?Component Value Date/Time  ? NA 135 09/14/2021 0117  ? NA 140 12/02/2016 0949  ? K 5.4 (H) 09/14/2021 0117  ? K 4.4 12/02/2016 0949  ? CL 104 09/14/2021 0117  ? CO2 21 (L) 09/14/2021 0117  ? CO2 24 12/02/2016 0949  ? GLUCOSE 83 09/14/2021 0117  ? GLUCOSE 151 (H) 12/02/2016 0949  ? BUN 28 (H) 09/14/2021 0117  ? BUN 9.8 12/02/2016 0949  ? CREATININE 0.88 09/14/2021 0117  ? CREATININE 0.97 11/30/2020 0913  ? CREATININE 0.61 04/18/2017 1210  ? CREATININE 0.7 12/02/2016 0949  ? CALCIUM 8.9 09/14/2021 0117  ? CALCIUM 8.7 12/02/2016 0949  ? PROT 5.7 (L) 09/14/2021 0117  ? PROT 6.3 (L) 12/02/2016 0949  ? ALBUMIN 3.0 (L) 09/14/2021 0117  ?  ALBUMIN 3.5 12/02/2016 0949  ? AST 29 09/14/2021 0117  ? AST 29 11/30/2020 0913  ? AST 34 12/02/2016 0949  ? ALT 13 09/14/2021 0117  ? ALT 17 11/30/2020 0913  ? ALT 29 12/02/2016 0949  ? ALKPHOS 59 09/14/2021 0117  ? ALKPHOS 62 12/02/2016 0949  ? BILITOT 1.6 (H) 09/14/2021 0117  ? BILITOT 0.8 11/30/2020 0913  ? BILITOT 0.60 12/02/2016 0949  ? GFRNONAA >60 09/14/2021 0117  ? GFRNONAA 59 (L) 11/30/2020 0913  ? GFRNONAA 87 04/18/2017 1210  ? GFRAA 55 (L) 12/01/2019 1319  ? GFRAA 101 04/18/2017 1210  ? ?Lipase  ?   ?Component Value Date/Time  ? LIPASE 30 09/11/2021 1633  ? ? ? ? ? ?Studies/Results: ?No results found. ? ?Anti-infectives: ?Anti-infectives (From admission, onward)  ? ? Start     Dose/Rate Route Frequency Ordered Stop  ? 09/12/21 0545  ceFAZolin (ANCEF) IVPB 2g/100 mL premix       ? 2 g ?200 mL/hr over 30 Minutes Intravenous  Once 09/12/21 0539 09/12/21 0737  ? ?  ? ? ? ?Assessment/Plan ?Incarcerated umbilical hernia (with 1 cm x 1 cm fascial defect) with strangulated, necrotic small intestine ?-POD#2 S/p UMBILICAL HERNIA REPAIR, SMALL BOWEL RESECTION: [id number]  5/3 Stechschulte ?- Passing a little flatus, ok for full liquids ?- mobilize, PT consult pending ?- continue abdominal binder ?- plan for paracentesis Monday. Will need frequent paracentesis post op ?  ?ID - ancef periop ?FEN - IVF, FLD ?VTE - lovenox ?Foley - none ?  ?Hyperkalemia - lokelma ordered by primary ?NASH cirrhosis ?Thrombocytopenia ?DM ?Hypothyroidism ?GERD ? ? ? LOS: 2 days  ? ? ?Wellington Hampshire, PA-C ?[facility] ?09/14/2021, 9:00 AM ?Please see Amion for pager number during day hours 7:00am-4:30pm ? ?

## 2021-09-14 NOTE — Assessment & Plan Note (Signed)
-   Continue home Synthroid °

## 2021-09-14 NOTE — Evaluation (Signed)
Physical Therapy Evaluation ?Patient Details ?Name: Crystal Barnett ?MRN: [id number] ?DOB: 01/17/1941 ?Today's Date: 09/14/2021 ? ?History of Present Illness ? Pt is an 81 y.o. female admitted 09/11/21 with abdominal pain, nausea/vomiting, constipation. Workup for incarcerated umbilical hernia s/p hernia repair and small bowel resection 5/3. Pt also with ascites; plan to have IR perform paracentesis 5/4 if ascites accumulating. PMH includes DM, hepatic cirrhosis, osteoporosis, cataract, liver CA. ?  ?Clinical Impression ? Pt presents with an overall decrease in functional mobility secondary to above. PTA, pt mod indep using SPC for community ambulation, drives, lives with supportive son. Initiated educ re: abdominal precautions for comfort, activity recommendations, and importance of mobility. Today, pt able to transfer and ambulate with SPC, requiring up to minA for mobility; limited by c/o abdominal discomfort. Pt would benefit from continued acute PT services to maximize functional mobility and independence prior to d/c home.     ? ?Recommendations for follow up therapy are one component of a multi-disciplinary discharge planning process, led by the attending physician.  Recommendations may be updated based on patient status, additional functional criteria and insurance authorization. ? ?Follow Up Recommendations No PT follow up ? ?  ?Assistance Recommended at Discharge Intermittent Supervision/Assistance  ?Patient can return home with the following ? A little help with bathing/dressing/bathroom;Assistance with cooking/housework;Assist for transportation;Help with stairs or ramp for entrance ? ?  ?Equipment Recommendations None recommended by PT  ?Recommendations for Other Services ?    ?  ?Functional Status Assessment Patient has had a recent decline in their functional status and demonstrates the ability to make significant improvements in function in a reasonable and predictable amount of time.  ? ?  ?Precautions /  Restrictions Precautions ?Precautions: Fall;Other (comment) ?Precaution Comments: abdominal precautions for comfort (has abdominal binder); urine incontinence ?Restrictions ?Weight Bearing Restrictions: No  ? ?  ? ?Mobility ? Bed Mobility ?Overal bed mobility: Needs Assistance ?Bed Mobility: Rolling, Sidelying to Sit ?Rolling: Supervision ?Sidelying to sit: Min assist ?  ?  ?  ?General bed mobility comments: educ on log roll technique for comfort due to abdominal pain; minA for trunk elevation from sidelying ?  ? ?Transfers ?Overall transfer level: Needs assistance ?Equipment used: 1 person hand held assist ?Transfers: Sit to/from Stand ?Sit to Stand: Min assist ?  ?  ?  ?  ?  ?General transfer comment: minA for HHA to elevate trunk ?  ? ?Ambulation/Gait ?Ambulation/Gait assistance: Min assist, Min guard ?Gait Distance (Feet): 80 Feet ?Assistive device: Straight cane, 1 person hand held assist ?Gait Pattern/deviations: Step-through pattern, Decreased stride length, Trunk flexed ?Gait velocity: Decreased ?  ?  ?General Gait Details: slow, guarded gait with SPC, initial HHA for added stability, progressing to only SPC use and intermittent min guard; pt declining hallway ambulation due to urine incontinence ? ?Stairs ?  ?  ?  ?  ?  ? ?Wheelchair Mobility ?  ? ?Modified Rankin (Stroke Patients Only) ?  ? ?  ? ?Balance Overall balance assessment: Needs assistance ?  ?Sitting balance-Leahy Scale: Fair ?  ?  ?Standing balance support: No upper extremity supported, Single extremity supported, During functional activity ?Standing balance-Leahy Scale: Fair ?Standing balance comment: pt can static stand and take steps without UE support; static and dynamic stability improved with at least single UE support ?  ?  ?  ?  ?  ?  ?  ?  ?  ?  ?  ?   ? ? ? ?Pertinent Vitals/Pain Pain Assessment ?Pain Assessment:  Faces ?Faces Pain Scale: Hurts little more ?Pain Location: abdomen ?Pain Descriptors / Indicators: Discomfort, Grimacing,  Guarding ?Pain Intervention(s): Monitored during session, Limited activity within patient's tolerance  ? ? ?Home Living Family/patient expects to be discharged to:: Private residence ?Living Arrangements: Children ?Available Help at Discharge: Family;Available PRN/intermittently ?Type of Home: House ?Home Access: Stairs to enter ?Entrance Stairs-Rails: Right ?Entrance Stairs-Number of Steps: 3 ?  ?Home Layout: One level ?Home Equipment: Cane - single Barista (2 wheels) ?Additional Comments: pt thinks she has RW somewhere from prior knee sx  ?  ?Prior Function Prior Level of Function : Independent/Modified Independent;Driving ?  ?  ?  ?  ?  ?  ?Mobility Comments: Mod indep ambulating with SPC for community distances, does not use DME in house ?ADLs Comments: Stands to shower; shower seat would not fit in shower. Pt and her son split household tasks, including cooking and cleaning ?  ? ? ?Hand Dominance  ?   ? ?  ?Extremity/Trunk Assessment  ? Upper Extremity Assessment ?Upper Extremity Assessment: Overall WFL for tasks assessed ?  ? ?Lower Extremity Assessment ?Lower Extremity Assessment: Overall WFL for tasks assessed ?  ? ?   ?Communication  ? Communication: HOH  ?Cognition Arousal/Alertness: Awake/alert ?Behavior During Therapy: [facility] for tasks assessed/performed ?Overall Cognitive Status: Within Functional Limits for tasks assessed ?  ?  ?  ?  ?  ?  ?  ?  ?  ?  ?  ?  ?  ?  ?  ?  ?  ?  ?  ? ?  ?General Comments General comments (skin integrity, edema, etc.): educ re: abdominal precautions, activity recommendations, importance of mobility to reduce potential complications post-op; pt declines need for follow-up PT services ? ?  ?Exercises    ? ?Assessment/Plan  ?  ?PT Assessment Patient needs continued PT services  ?PT Problem List Decreased activity tolerance;Decreased balance;Decreased mobility;Decreased knowledge of precautions;Pain ? ?   ?  ?PT Treatment Interventions DME instruction;Gait  training;Stair training;Functional mobility training;Therapeutic activities;Therapeutic exercise;Balance training;Patient/family education   ? ?PT Goals (Current goals can be found in the Care Plan section)  ?Acute Rehab PT Goals ?Patient Stated Goal: return home ?PT Goal Formulation: With patient ?Time For Goal Achievement: 09/28/21 ?Potential to Achieve Goals: Good ? ?  ?Frequency Min 3X/week ?  ? ? ?Co-evaluation   ?  ?  ?  ?  ? ? ?  ?AM-PAC PT "6 Clicks" Mobility  ?Outcome Measure Help needed turning from your back to your side while in a flat bed without using bedrails?: None ?Help needed moving from lying on your back to sitting on the side of a flat bed without using bedrails?: A Little ?Help needed moving to and from a bed to a chair (including a wheelchair)?: A Little ?Help needed standing up from a chair using your arms (e.g., wheelchair or bedside chair)?: A Little ?Help needed to walk in hospital room?: A Little ?Help needed climbing 3-5 steps with a railing? : A Little ?6 Click Score: 19 ? ?  ?End of Session Equipment Utilized During Treatment: Gait belt ?Activity Tolerance: Patient tolerated treatment well;Patient limited by fatigue ?Patient left: in chair;with call bell/phone within reach ?Nurse Communication: Mobility status;Other (comment) (RN reports ok with no chair alarm) ?PT Visit Diagnosis: Other abnormalities of gait and mobility (R26.89);Muscle weakness (generalized) (M62.81) ?  ? ?Time: 2993-7169 ?PT Time Calculation (min) (ACUTE ONLY): 20 min ? ? ?Charges:   PT Evaluation ?$PT Eval Moderate Complexity: 1  Mod ?  ?  ?   ?Mabeline Caras, PT, DPT ?Acute Rehabilitation Services  ?Pager [phone] ?Office [phone] ? ?Crystal Barnett ?09/14/2021, 9:11 AM ? ?

## 2021-09-14 NOTE — Discharge Instructions (Signed)
[facility] _______[facility] ? ?HERNIA REPAIR: POST OP INSTRUCTIONS ? ?Always review your discharge instruction sheet given to you by the facility where your surgery was performed. ?IF YOU HAVE DISABILITY OR FAMILY LEAVE FORMS, YOU MUST BRING THEM TO THE OFFICE FOR PROCESSING.   ?DO NOT GIVE THEM TO YOUR DOCTOR. ? ?1. A  prescription for pain medication may be given to you upon discharge.  Take your pain medication as prescribed, if needed.  If narcotic pain medicine is not needed, then you may take acetaminophen (Tylenol) or ibuprofen (Advil) as needed. ?2. Take your usually prescribed medications unless otherwise directed. ?If you need a refill on your pain medication, please contact your pharmacy.  They will contact our office to request authorization. Prescriptions will not be filled after 5 pm or on week-ends. ?3. You should follow a light diet the first 24 hours after arrival home, such as soup and crackers, etc.  Be sure to include lots of fluids daily.  Resume your normal diet the day after surgery. ?4.Most patients will experience some swelling and bruising around the umbilicus or in the groin and scrotum.  Ice packs and reclining will help.  Swelling and bruising can take several days to resolve.  ?6. It is common to experience some constipation if taking pain medication after surgery.  Increasing fluid intake and taking a stool softener (such as Colace) will usually help or prevent this problem from occurring.  A mild laxative (Milk of Magnesia or Miralax) should be taken according to package directions if there are no bowel movements after 48 hours. ?7. Unless discharge instructions indicate otherwise, you may remove your bandages 24-48 hours after surgery, and you may shower at that time.  You may have steri-strips (small skin tapes) in place directly over the incision.  These strips should be left on the skin for 7-10 days.  If your surgeon used skin glue on the incision, you may shower in  24 hours.  The glue will flake off over the next 2-3 weeks.  Any sutures or staples will be removed at the office during your follow-up visit. ?8. ACTIVITIES:  You may resume regular (light) daily activities beginning the next day--such as daily self-care, walking, climbing stairs--gradually increasing activities as tolerated.  You may have sexual intercourse when it is comfortable.  Refrain from any heavy lifting or straining until approved by your doctor. ? ?a.You may drive when you are no longer taking prescription pain medication, you can comfortably wear a seatbelt, and you can safely maneuver your car and apply brakes. ? ?9.You should see your doctor in the office for a follow-up appointment approximately 2-3 weeks after your surgery.  Make sure that you call for this appointment within a day or two after you arrive home to insure a convenient appointment time. ? ?WHEN TO CALL YOUR DOCTOR: ?Fever over 101.0 ?Inability to urinate ?Nausea and/or vomiting ?Extreme swelling or bruising ?Continued bleeding from incision. ?Increased pain, redness, or drainage from the incision ? ?The clinic staff is available to answer your questions during regular business hours.  Please don?t hesitate to call and ask to speak to one of the nurses for clinical concerns.  If you have a medical emergency, go to the nearest emergency room or call 911.  A surgeon from [facility] is always on call at the hospital ? ? ?[address] [place], [place], [place]  [address] ? ? [address], [place], [place]   [address] ?[phone] ?  [phone] ? FAX [phone] ?Web site: [email or web address] ? ?

## 2021-09-14 NOTE — Progress Notes (Signed)
?PROGRESS NOTE ? ?Crystal Barnett EUM:[id number] DOB: [birth year]  ? ?PCP: Vernie Shanks, MD ? ?Patient is from: Home. ? ?DOA: 09/11/2021 LOS: 2 ? ?Chief complaints ?Chief Complaint  ?Patient presents with  ? Abdominal Pain  ? Emesis  ?  ? 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  She underwent a small bowel resection and hernia repair on 09/12/2021.  Diet advanced to full liquid diet by general surgery.  Per surgery, patient will need frequent paracentesis postop.  Plan for paracentesis on Monday.  ? ?Subjective: ?Seen and examined earlier this morning.  No major events overnight of this morning.  No complaints other abdominal pain than pain with movement.  She is passing gas.  No bowel movement yet.  Working on full liquid diet.  Denies chest pain or dyspnea. ? ?Objective: ?Vitals:  ? 09/13/21 2038 09/14/21 0437 09/14/21 0837 09/14/21 1709  ?BP: 113/64 121/61 (!) 115/58 (!) 108/54  ?Pulse: 83 82 81 82  ?Resp: 18 18 18 18   ?Temp: 98.1 ?F (36.7 ?C) 98.5 ?F (36.9 ?C) 98.2 ?F (36.8 ?C) 98.2 ?F (36.8 ?C)  ?TempSrc: Oral Oral Oral Oral  ?SpO2: 95% 96% 98% 98%  ?Weight:  71.4 kg    ?Height:      ? ? ?Examination: ? ?GENERAL: No apparent distress.  Nontoxic. ?HEENT: MMM.  Vision and hearing grossly intact.  ?NECK: Supple.  No apparent JVD.  ?RESP:  No IWOB.  Fair aeration bilaterally. ?CVS:  RRR. Heart sounds normal.  ?ABD/GI/GU: BS+. Abd soft.  Abdominal binder in place. ?MSK/EXT:  Moves extremities. No apparent deformity. No edema.  ?SKIN: no apparent skin lesion or wound ?NEURO: Awake, alert and oriented appropriately.  No apparent focal neuro deficit. ?PSYCH: Calm. Normal affect.  ? ?Procedures:  ?5/3-small bowel resection and umbilical hernia  repair ? ?Microbiology summarized: ?None ? ?Assessment and Plan: ?* Incarcerated umbilical hernia ?S/p small bowel resection and umbilical hernia repair by Dr. Thermon Leyland on 5/3 ?-General surgery managing-on full liquid diet. ?-Mobilize, binder... ?-Per surgery, needs frequent paracentesis, when scheduled for Monday. ? ?NASH Cirrhosis of liver with ascites ([place]) ?Continue home p.o. Lasix 40 mg daily and Aldactone 200 mg daily at home. ?Per general surgery, needs frequent paracentesis.  One tentatively planned for Monday. ?Sodium restriction. ? ?Hyperkalemia ?K5.4.  Likely due to Aldactone. ?-Lokelma 10 g x 1 ?-Recheck in the morning ? ?GERD (gastroesophageal reflux disease) ?Continue PPI ? 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  On low-dose metformin at home.  CBG within normal. ? ?Thrombocytopenia ([place]) ?Recent Labs  ?Lab 09/11/21 ?1633 09/13/21 ?5400 09/14/21 ?0117  ?PLT 127* 115* 105*  ?-Likely due to liver cirrhosis and acute illness. ?-Continue monitoring ? ? ?Hypothyroidism ?Continue home Synthroid ? ? ?  ?DVT prophylaxis:  ?enoxaparin (LOVENOX) injection 40 mg Start: 09/13/21 0800 ? ?Code Status: Full code ?Family Communication: Patient and/or RN. Available if any question.  ?Level of care: Telemetry Surgical ?Status is: Inpatient ?Remains inpatient appropriate because: Incarcerated hernia and its complication ? ? ?Final disposition: Likely home once cleared by general surgery. ?Consultants:  ?General surgery ? ?Sch Meds:  ?Scheduled Meds: ? docusate sodium  100 mg Oral BID  ? enoxaparin (LOVENOX) injection  40 mg Subcutaneous Q24H  ? furosemide  40 mg Oral Daily  ? levothyroxine  50 mcg  Oral Daily  ? montelukast  10 mg Oral QHS  ? pantoprazole  40 mg Oral BID  ? sodium chloride flush  3 mL Intravenous Q12H  ? spironolactone  200 mg Oral Daily  ? ?Continuous Infusions: ? methocarbamol (ROBAXIN) IV    ? ?PRN Meds:.albuterol, HYDROmorphone (DILAUDID) injection, methocarbamol (ROBAXIN) IV,  ondansetron **OR** ondansetron (ZOFRAN) IV, oxyCODONE, oxyCODONE, prochlorperazine, simethicone ? ?Antimicrobials: ?Anti-infectives (From admission, onward)  ? ? Start     Dose/Rate Route Frequency Ordered Stop  ? 09/12/21 0545  ceFAZolin (ANCEF) IVPB 2g/100 mL premix       ? 2 g ?200 mL/hr over 30 Minutes Intravenous  Once 09/12/21 0539 09/12/21 0737  ? ?  ? ? ? ?I have personally reviewed the following labs and images: ?CBC: ?Recent Labs  ?Lab 09/11/21 ?1633 09/13/21 ?5916 09/14/21 ?0117  ?WBC 4.6 6.9 7.7  ?HGB 14.4 12.6 11.2*  ?HCT 41.8 37.6 33.4*  ?MCV 92.3 91.7 93.3  ?PLT 127* 115* 105*  ? ?BMP &GFR ?Recent Labs  ?Lab 09/11/21 ?1633 09/13/21 ?3846 09/14/21 ?0117  ?NA 138 134* 135  ?K 4.3 4.8 5.4*  ?CL 106 105 104  ?CO2 20* 22 21*  ?GLUCOSE 122* 117* 83  ?BUN 23 23 28*  ?CREATININE 0.90 0.99 0.88  ?CALCIUM 9.5 8.9 8.9  ? ?Estimated Creatinine Clearance: 49.2 mL/min (by C-G formula based on SCr of 0.88 mg/dL). ?Liver & Pancreas: ?Recent Labs  ?Lab 09/11/21 ?1633 09/13/21 ?6599 09/14/21 ?0117  ?AST 32 34 29  ?ALT 19 21 13   ?ALKPHOS 82 69 59  ?BILITOT 0.8 1.1 1.6*  ?PROT 7.5 5.9* 5.7*  ?ALBUMIN 4.0 3.2* 3.0*  ? ?Recent Labs  ?Lab 09/11/21 ?1633  ?LIPASE 30  ? ?No results for input(s): AMMONIA in the last 168 hours. ?Diabetic: ?No results for input(s): HGBA1C in the last 72 hours. ?Recent Labs  ?Lab 09/12/21 ?0740 09/12/21 ?1056  ?GLUCAP 93 94  ? ?Cardiac Enzymes: ?No results for input(s): CKTOTAL, CKMB, CKMBINDEX, TROPONINI in the last 168 hours. ?No results for input(s): PROBNP in the last 8760 hours. ?Coagulation Profile: ?Recent Labs  ?Lab 09/12/21 ?0432  ?INR 1.2  ? ?Thyroid Function Tests: ?No results for input(s): TSH, T4TOTAL, FREET4, T3FREE, THYROIDAB in the last 72 hours. ?Lipid Profile: ?No results for input(s): CHOL, HDL, LDLCALC, TRIG, CHOLHDL, LDLDIRECT in the last 72 hours. ?Anemia Panel: ?No results for input(s): VITAMINB12, FOLATE, FERRITIN, TIBC, IRON, RETICCTPCT in the last 72 hours. ?Urine  analysis: ?   ?Component Value Date/Time  ? [place] YELLOW 09/11/2021 0236  ? APPEARANCEUR CLEAR 09/11/2021 0236  ? LABSPEC 1.032 (H) 09/11/2021 0236  ? PHURINE 6.0 09/11/2021 0236  ? GLUCOSEU NEGATIVE 09/11/2021 0236  ? [place] NEGATIVE 09/11/2021 0236  ? [place] NEGATIVE 09/11/2021 0236  ? [place] NEGATIVE 09/11/2021 0236  ? PROTEINUR NEGATIVE 09/11/2021 0236  ? UROBILINOGEN 0.2 01/30/2010 1110  ? NITRITE NEGATIVE 09/11/2021 0236  ? LEUKOCYTESUR NEGATIVE 09/11/2021 0236  ? ?Sepsis Labs: ?Invalid input(s): PROCALCITONIN, LACTICIDVEN ? ?Microbiology: ?Recent Results (from the past 240 hour(s))  ?Culture, body fluid w Gram Stain-bottle     Status: None (Preliminary result)  ? Collection Time: 09/10/21 11:56 AM  ? Specimen: Peritoneal Washings  ?Result Value Ref Range Status  ? Specimen Description PERITONEAL  Final  ? Special Requests NONE  Final  ? Culture   Final  ?  NO GROWTH 4 DAYS ?Performed at [facility], [place] [address], [place], [place] [address] ?  ? Report Status PENDING  Incomplete  ?  Gram stain     Status: None  ? Collection Time: 09/10/21 11:56 AM  ? Specimen: Peritoneal Washings  ?Result Value Ref Range Status  ? Specimen Description PERITONEAL  Final  ? Special Requests NONE  Final  ? Gram Stain   Final  ?  NO WBC SEEN ?NO ORGANISMS SEEN ?Performed at [facility], [place] [address], [place], [place] [address] ?  ? Report Status 09/10/2021 FINAL  Final  ? ? ?Radiology Studies: ?No results found. ? ? ? ?Crystal Barnett ?[facility] ? ?If 7PM-7AM, please contact night-coverage ?[email or web address] ?09/14/2021, 5:52 PM   ?

## 2021-09-14 NOTE — Progress Notes (Signed)
Mobility Specialist Progress Note: ? ? 09/14/21 1400  ?Mobility  ?Activity Ambulated with assistance in hallway  ?Level of Assistance Contact guard assist, steadying assist  ?Assistive Device Cane  ?Distance Ambulated (ft) 150 ft  ?Activity Response Tolerated well  ?$Mobility charge 1 Mobility  ? ?Pt received in bed willing to participate in mobility. Complaints of 4/10 abdominal pain. Left in bed with call bell in reach and all needs met.  ? ?Crystal Barnett ?Mobility Specialist ?Primary Phone [phone] ? ?

## 2021-09-14 NOTE — Assessment & Plan Note (Addendum)
A1c 5.3%.  On low-dose metformin at home.  CBG within normal. ?-Monitor glucose with daily labs ?

## 2021-09-14 NOTE — Assessment & Plan Note (Addendum)
S/p small bowel resection and umbilical hernia repair by Dr. Thermon Leyland on 5/3 ?-General surgery managing-on carb modified diet. ?-Per surgery, plan for paracentesis on Monday (5/8)  ?-Mobilize, binder... ?

## 2021-09-14 NOTE — Hospital Course (Addendum)
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  She underwent a small bowel resection and hernia repair on 09/12/2021.  Diet advanced by general surgery.  Per surgery, patient will need frequent paracentesis postop.  Plan for paracentesis on Monday (5/8).  OT recommends SNF although PT recommends no follow-up.  Needs reevaluation by therapy.  ?

## 2021-09-14 NOTE — Assessment & Plan Note (Addendum)
Recent Labs  ?Lab 09/11/21 ?1633 09/13/21 ?2703 09/14/21 ?0117 09/15/21 ?0038 09/16/21 ?5009  ?PLT 127* 115* 105* 107* 102*  ?-Likely due to liver cirrhosis and acute illness. ?-Continue monitoring ? ?

## 2021-09-14 NOTE — Assessment & Plan Note (Addendum)
Continue home p.o. Lasix 40 mg daily and Aldactone 200 mg daily at home. ?-Per general surgery, needs frequent paracentesis.  One tentatively planned for Monday. ?-Continue home Lasix.  Reduced Aldactone to 100 mg daily due to hyponatremia ?-Sodium restriction. ?

## 2021-09-15 DIAGNOSIS — K420 Umbilical hernia with obstruction, without gangrene: Secondary | ICD-10-CM | POA: Diagnosis not present

## 2021-09-15 DIAGNOSIS — K219 Gastro-esophageal reflux disease without esophagitis: Secondary | ICD-10-CM | POA: Diagnosis not present

## 2021-09-15 DIAGNOSIS — K7460 Unspecified cirrhosis of liver: Secondary | ICD-10-CM | POA: Diagnosis not present

## 2021-09-15 DIAGNOSIS — E119 Type 2 diabetes mellitus without complications: Secondary | ICD-10-CM | POA: Diagnosis not present

## 2021-09-15 DIAGNOSIS — R5381 Other malaise: Secondary | ICD-10-CM

## 2021-09-15 DIAGNOSIS — E871 Hypo-osmolality and hyponatremia: Secondary | ICD-10-CM

## 2021-09-15 LAB — CULTURE, BODY FLUID W GRAM STAIN -BOTTLE: Culture: NO GROWTH

## 2021-09-15 LAB — RENAL FUNCTION PANEL
Albumin: 2.8 g/dL — ABNORMAL LOW (ref 3.5–5.0)
Anion gap: 7 (ref 5–15)
BUN: 29 mg/dL — ABNORMAL HIGH (ref 8–23)
CO2: 27 mmol/L (ref 22–32)
Calcium: 8.7 mg/dL — ABNORMAL LOW (ref 8.9–10.3)
Chloride: 97 mmol/L — ABNORMAL LOW (ref 98–111)
Creatinine, Ser: 0.95 mg/dL (ref 0.44–1.00)
GFR, Estimated: >60 mL/min (ref >60)
Glucose, Bld: 103 mg/dL — ABNORMAL HIGH (ref 70–99)
Phosphorus: 2.8 mg/dL (ref 2.5–4.6)
Potassium: 4.5 mmol/L (ref 3.5–5.1)
Sodium: 131 mmol/L — ABNORMAL LOW (ref 135–145)

## 2021-09-15 LAB — CBC
HCT: 31.8 % — ABNORMAL LOW (ref 36.0–46.0)
Hemoglobin: 11 g/dL — ABNORMAL LOW (ref 12.0–15.0)
MCH: 31.6 pg (ref 26.0–34.0)
MCHC: 34.6 g/dL (ref 30.0–36.0)
MCV: 91.4 fL (ref 80.0–100.0)
Platelets: 107 10*3/uL — ABNORMAL LOW (ref 150–400)
RBC: 3.48 MIL/uL — ABNORMAL LOW (ref 3.87–5.11)
RDW: 12.9 % (ref 11.5–15.5)
WBC: 7 10*3/uL (ref 4.0–10.5)
nRBC: 0 % (ref 0.0–0.2)

## 2021-09-15 LAB — HEPATIC FUNCTION PANEL
ALT: 17 U/L (ref 0–44)
AST: 26 U/L (ref 15–41)
Albumin: 2.7 g/dL — ABNORMAL LOW (ref 3.5–5.0)
Alkaline Phosphatase: 67 U/L (ref 38–126)
Bilirubin, Direct: 0.4 mg/dL — ABNORMAL HIGH (ref 0.0–0.2)
Indirect Bilirubin: 0.6 mg/dL (ref 0.3–0.9)
Total Bilirubin: 1 mg/dL (ref 0.3–1.2)
Total Protein: 5.9 g/dL — ABNORMAL LOW (ref 6.5–8.1)

## 2021-09-15 LAB — PHOSPHORUS: Phosphorus: 2.8 mg/dL (ref 2.5–4.6)

## 2021-09-15 LAB — MAGNESIUM: Magnesium: 1.8 mg/dL (ref 1.7–2.4)

## 2021-09-15 NOTE — Evaluation (Signed)
Occupational Therapy Evaluation ?Patient Details ?Name: Crystal Barnett ?MRN: [id number] ?DOB: Apr 09, 1941 ?Today's Date: 09/15/2021 ? ? ?History of Present Illness Pt is an 81 y.o. female admitted 09/11/21 with abdominal pain, nausea/vomiting, constipation. Workup for incarcerated umbilical hernia s/p hernia repair and small bowel resection 5/3. Pt also with ascites; plan to have IR perform paracentesis 5/4 if ascites accumulating. PMH includes DM, hepatic cirrhosis, osteoporosis, cataract, liver CA.  ? ?Clinical Impression ?  ?Pt  reports independence at baseline with ADLs and functional mobility, lives with son who can assist however pt states she does not want son assisting with bathing/dressing tasks. Pt currently requiring min-max A for ADLs, mod A for bed mobility, and min A for transfers with SPC. Pt with mild unsteadiness, reaching for counters/bed rails during mobility. Pt with mild LOB x1 during pericare, able to correct with minA. Pt reporting 6/10 abdominal pain this session, difficulty performing LB ADLs. Pt presenting with impairments listed below, will follow acutely. Pt declining HHOT at d/c, recommend SNF pending progress given pt's dependence with ADLs at time of evaluation. ?   ? ?Recommendations for follow up therapy are one component of a multi-disciplinary discharge planning process, led by the attending physician.  Recommendations may be updated based on patient status, additional functional criteria and insurance authorization.  ? ?Follow Up Recommendations ? Skilled nursing-short term rehab (<3 hours/day) (pending progress, pt declining HHOT, open to SNF, wants limited help from son at home.)  ?  ?Assistance Recommended at Discharge Intermittent Supervision/Assistance  ?Patient can return home with the following A lot of help with bathing/dressing/bathroom;A little help with walking and/or transfers;Assistance with cooking/housework;Assist for transportation;Help with stairs or ramp for  entrance ? ?  ?Functional Status Assessment ? Patient has had a recent decline in their functional status and demonstrates the ability to make significant improvements in function in a reasonable and predictable amount of time.  ?Equipment Recommendations ? None recommended by OT;Other (comment)  ?  ?Recommendations for Other Services   ? ? ?  ?Precautions / Restrictions Precautions ?Precautions: Fall;Other (comment) ?Precaution Comments: abdominal precautions for comfort (has abdominal binder); urine incontinence ?Restrictions ?Weight Bearing Restrictions: No  ? ?  ? ?Mobility Bed Mobility ?Overal bed mobility: Needs Assistance ?Bed Mobility: Sidelying to Sit, Sit to Sidelying ?  ?Sidelying to sit: Mod assist ?  ?  ?Sit to sidelying: Mod assist ?General bed mobility comments: assist for trunk elevation, and to bring LE's into bed ?  ? ?Transfers ?Overall transfer level: Needs assistance ?Equipment used: Straight cane ?Transfers: Sit to/from Stand ?Sit to Stand: Min assist ?  ?  ?  ?  ?  ?  ?  ? ?  ?Balance Overall balance assessment: Needs assistance ?Sitting-balance support: Bilateral upper extremity supported, Feet supported ?Sitting balance-Leahy Scale: Fair ?  ?  ?Standing balance support: No upper extremity supported, Single extremity supported, During functional activity ?Standing balance-Leahy Scale: Fair ?Standing balance comment: pt requiring frequent external support ?  ?  ?  ?  ?  ?  ?  ?  ?  ?  ?  ?   ? ?ADL either performed or assessed with clinical judgement  ? ?ADL Overall ADL's : Needs assistance/impaired ?Eating/Feeding: Set up;Sitting ?  ?Grooming: Wash/dry hands;Brushing hair;Standing ?Grooming Details (indicate cue type and reason): completed standing at sink ?Upper Body Bathing: Minimal assistance;Standing ?  ?Lower Body Bathing: Sitting/lateral leans;Sit to/from stand;Maximal assistance ?  ?Upper Body Dressing : Minimal assistance;Standing;Sitting ?  ?Lower Body Dressing: Maximal  assistance;Sit  to/from stand;Sitting/lateral leans ?  ?Toilet Transfer: Minimal Teacher, English as a foreign language;Ambulation ?Toilet Transfer Details (indicate cue type and reason): with cane, mild LOB x1 during pericare ?Toileting- Clothing Manipulation and Hygiene: Minimal assistance;Sitting/lateral lean;Sit to/from stand ?Toileting - Clothing Manipulation Details (indicate cue type and reason): for clothing mgmt ?  ?  ?Functional mobility during ADLs: Min guard;Minimal assistance;Cane ?   ? ? ? ?Vision Baseline Vision/History: 1 Wears glasses ?Vision Assessment?: No apparent visual deficits  ?   ?Perception   ?  ?Praxis   ?  ? ?Pertinent Vitals/Pain Pain Assessment ?Pain Assessment: Faces ?Pain Score: 6  ?Faces Pain Scale: Hurts even more ?Pain Location: abdomen ?Pain Descriptors / Indicators: Discomfort, Grimacing, Guarding ?Pain Intervention(s): Limited activity within patient's tolerance, Monitored during session, Repositioned  ? ? ? ?Hand Dominance   ?  ?Extremity/Trunk Assessment Upper Extremity Assessment ?Upper Extremity Assessment: Overall WFL for tasks assessed ?  ?Lower Extremity Assessment ?Lower Extremity Assessment: Defer to PT evaluation ?  ?Cervical / Trunk Assessment ?Cervical / Trunk Assessment: Normal ?  ?Communication Communication ?Communication: HOH ?  ?Cognition Arousal/Alertness: Awake/alert ?Behavior During Therapy: [facility] for tasks assessed/performed ?Overall Cognitive Status: Within Functional Limits for tasks assessed ?  ?  ?  ?  ?  ?  ?  ?  ?  ?  ?  ?  ?  ?  ?  ?  ?General Comments: pt able to verbalize med mgmt strategies she uses at home ?  ?  ?General Comments  VSS on RA ? ?  ?Exercises   ?  ?Shoulder Instructions    ? ? ?Home Living Family/patient expects to be discharged to:: Private residence ?Living Arrangements: Children (son) ?Available Help at Discharge: Family;Available PRN/intermittently ?Type of Home: House ?Home Access: Stairs to enter ?Entrance Stairs-Number of Steps: 3 ?Entrance  Stairs-Rails: Right ?Home Layout: One level ?  ?  ?Bathroom Shower/Tub: Walk-in shower ?  ?Bathroom Toilet: Standard ?  ?  ?Home Equipment: Kasandra Knudsen - single point ?  ?  ?  ? ?  ?Prior Functioning/Environment Prior Level of Function : Independent/Modified Independent;Driving ?  ?  ?  ?  ?  ?  ?Mobility Comments: uses cane for community mobility ?ADLs Comments: Stands to shower; shower seat would not fit in shower. Pt and her son split household tasks, including cooking and cleaning ?  ? ?  ?  ?OT Problem List: Decreased strength;Decreased activity tolerance;Decreased range of motion;Impaired balance (sitting and/or standing) ?  ?   ?OT Treatment/Interventions: Self-care/ADL training;Therapeutic exercise;DME and/or AE instruction;Therapeutic activities;Patient/family education;Balance training;Energy conservation  ?  ?OT Goals(Current goals can be found in the care plan section) Acute Rehab OT Goals ?Patient Stated Goal: none stated ?OT Goal Formulation: With patient ?Time For Goal Achievement: 09/29/21 ?Potential to Achieve Goals: Good  ?OT Frequency: Min 3X/week ?  ? ?Co-evaluation   ?  ?  ?  ?  ? ?  ?AM-PAC OT "6 Clicks" Daily Activity     ?Outcome Measure Help from another person eating meals?: None ?Help from another person taking care of personal grooming?: A Little ?Help from another person toileting, which includes using toliet, bedpan, or urinal?: A Little ?Help from another person bathing (including washing, rinsing, drying)?: A Lot ?Help from another person to put on and taking off regular upper body clothing?: A Little ?Help from another person to put on and taking off regular lower body clothing?: A Lot ?6 Click Score: 17 ?  ?End of Session Equipment Utilized During Treatment: Other (comment) (cane) ?Nurse  Communication: Mobility status;Patient requests pain meds (notified of pt vomiting/regurgitation at end of session) ? ?Activity Tolerance: Patient tolerated treatment well ?Patient left: in bed;with call  bell/phone within reach;with bed alarm set;with nursing/sitter in room ? ?OT Visit Diagnosis: Unsteadiness on feet (R26.81);Other abnormalities of gait and mobility (R26.89);Muscle weakness (generalized) (M62.81)  ?

## 2021-09-15 NOTE — Assessment & Plan Note (Addendum)
Acutely deconditioned due to current illness and hospitalization. ?-OT recommends SNF.  PT recommends no follow-up ?-Recommend reevaluation by therapy to clarify disposition.  TOC notified ?

## 2021-09-15 NOTE — Progress Notes (Signed)
?PROGRESS NOTE ? ?Crystal Barnett JJO:[id number] DOB: 1941/01/06  ? ?PCP: Vernie Shanks, MD ? ?Patient is from: Home. ? ?DOA: 09/11/2021 LOS: 3 ? ?Chief complaints ?Chief Complaint  ?Patient presents with  ? Abdominal Pain  ? Emesis  ?  ? 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  She underwent a small bowel resection and hernia repair on 09/12/2021.  Diet advanced by general surgery.  Per surgery, patient will need frequent paracentesis postop.  Plan for paracentesis on Monday.  ? ?Subjective: ?Seen and examined earlier this morning.  No major events overnight of this morning.  No complaints.  Pain improved.  Denies chest pain or dyspnea. ? ?Objective: ?Vitals:  ? 09/15/21 0427 09/15/21 0427 09/15/21 0750 09/15/21 1541  ?BP:  (!) 104/59 107/64 109/63  ?Pulse:  79 79 80  ?Resp:  16 16 18   ?Temp:  98.2 ?F (36.8 ?C) 98.2 ?F (36.8 ?C) 97.9 ?F (36.6 ?C)  ?TempSrc:  Oral Oral Oral  ?SpO2:  95% 96% 97%  ?Weight: 72.5 kg     ?Height:      ? ? ?Examination: ? ?GENERAL: No apparent distress.  Nontoxic. ?HEENT: MMM.  Vision and hearing grossly intact.  ?NECK: Supple.  No apparent JVD.  ?RESP:  No IWOB.  Fair aeration bilaterally. ?CVS:  RRR. Heart sounds normal.  ?ABD/GI/GU: BS+. Abd soft.  No significant tenderness.  Laparotomy wound appears clean.  Abdominal binder ?MSK/EXT:  Moves extremities. No apparent deformity. No edema.  ?SKIN: no apparent skin lesion or wound ?NEURO: Awake and alert. Oriented appropriately.  No apparent focal neuro deficit. ?PSYCH: Calm. Normal affect.  ? ?Procedures:  ?5/3-small bowel resection and umbilical hernia repair ? ?Microbiology summarized: ?None ? ?Assessment and Plan: ?* Incarcerated umbilical hernia ?S/p small  bowel resection and umbilical hernia repair by Dr. Thermon Leyland on 5/3 ?-General surgery managing-on carb modified diet. ?-Mobilize, binder... ?-Plan for paracentesis on Monday ? ?NASH Cirrhosis of liver with ascites ([place]) ?Continue home p.o. Lasix 40 mg daily and Aldactone 200 mg daily at home. ?Per general surgery, needs frequent paracentesis.  One tentatively planned for Monday. ?Sodium restriction. ? ?Physical deconditioning ?Acutely deconditioned due to current illness and hospitalization. ?-Therapy recommends SNF ? ?Hyponatremia ?Could be due to Aldactone and acute illness. ?-Continue monitoring ? ?Hyperkalemia ?Resolved. ? ?GERD (gastroesophageal reflux disease) ?Continue PPI ? 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  On low-dose metformin at home.  CBG within normal. ?-Monitor glucose with daily labs ? ?Thrombocytopenia ([place]) ?Recent Labs  ?Lab 09/11/21 ?1633 09/13/21 ?1601 09/14/21 ?0117 09/15/21 ?0038  ?PLT 127* 115* 105* 107*  ?-Likely due to liver cirrhosis and acute illness. ?-Continue monitoring ? ? ?Hypothyroidism ?Continue home Synthroid ? ? ?  ?DVT prophylaxis:  ?enoxaparin (LOVENOX) injection 40 mg Start: 09/13/21 0800 ? ?Code Status: Full code ?Family Communication: Patient and/or RN. Available if any question.  ?Level of care: Telemetry Surgical ?Status is: Inpatient ?Remains inpatient appropriate because: Incarcerated hernia and its complication ? ? ?Final disposition: Therapy recommends SNF. ?Consultants:  ?General surgery ? ?Sch Meds:  ?Scheduled Meds: ? docusate sodium  100 mg Oral BID  ? enoxaparin (LOVENOX) injection  40 mg Subcutaneous Q24H  ? furosemide  40 mg Oral Daily  ? levothyroxine  50 mcg Oral Daily  ? montelukast  10 mg Oral  QHS  ? pantoprazole  40 mg Oral BID  ? sodium chloride flush  3 mL Intravenous Q12H  ? spironolactone  200 mg Oral Daily  ? ?Continuous Infusions: ? methocarbamol (ROBAXIN) IV    ? ?PRN Meds:.albuterol, HYDROmorphone (DILAUDID) injection,  methocarbamol (ROBAXIN) IV, ondansetron **OR** ondansetron (ZOFRAN) IV, oxyCODONE, oxyCODONE, prochlorperazine, simethicone ? ?Antimicrobials: ?Anti-infectives (From admission, onward)  ? ? Start     Dose/Rate Route Frequency Ordered Stop  ? 09/12/21 0545  ceFAZolin (ANCEF) IVPB 2g/100 mL premix       ? 2 g ?200 mL/hr over 30 Minutes Intravenous  Once 09/12/21 0539 09/12/21 0737  ? ?  ? ? ? ?I have personally reviewed the following labs and images: ?CBC: ?Recent Labs  ?Lab 09/11/21 ?1633 09/13/21 ?8032 09/14/21 ?0117 09/15/21 ?0038  ?WBC 4.6 6.9 7.7 7.0  ?HGB 14.4 12.6 11.2* 11.0*  ?HCT 41.8 37.6 33.4* 31.8*  ?MCV 92.3 91.7 93.3 91.4  ?PLT 127* 115* 105* 107*  ? ?BMP &GFR ?Recent Labs  ?Lab 09/11/21 ?1633 09/13/21 ?1224 09/14/21 ?0117 09/15/21 ?8250 09/15/21 ?0370  ?NA 138 134* 135  --  131*  ?K 4.3 4.8 5.4*  --  4.5  ?CL 106 105 104  --  97*  ?CO2 20* 22 21*  --  27  ?GLUCOSE 122* 117* 83  --  103*  ?BUN 23 23 28*  --  29*  ?CREATININE 0.90 0.99 0.88  --  0.95  ?CALCIUM 9.5 8.9 8.9  --  8.7*  ?MG  --   --   --  1.8  --   ?PHOS  --   --   --  2.8 2.8  ? ?Estimated Creatinine Clearance: 45.8 mL/min (by C-G formula based on SCr of 0.95 mg/dL). ?Liver & Pancreas: ?Recent Labs  ?Lab 09/11/21 ?1633 09/13/21 ?4888 09/14/21 ?0117 09/15/21 ?9169 09/15/21 ?4503  ?AST 32 34 29 26  --   ?ALT 19 21 13 17   --   ?ALKPHOS 82 69 59 67  --   ?BILITOT 0.8 1.1 1.6* 1.0  --   ?PROT 7.5 5.9* 5.7* 5.9*  --   ?ALBUMIN 4.0 3.2* 3.0* 2.7* 2.8*  ? ?Recent Labs  ?Lab 09/11/21 ?1633  ?LIPASE 30  ? ?No results for input(s): AMMONIA in the last 168 hours. ?Diabetic: ?No results for input(s): HGBA1C in the last 72 hours. ?Recent Labs  ?Lab 09/12/21 ?0740 09/12/21 ?1056  ?GLUCAP 93 94  ? ?Cardiac Enzymes: ?No results for input(s): CKTOTAL, CKMB, CKMBINDEX, TROPONINI in the last 168 hours. ?No results for input(s): PROBNP in the last 8760 hours. ?Coagulation Profile: ?Recent Labs  ?Lab 09/12/21 ?0432  ?INR 1.2  ? ?Thyroid Function Tests: ?No  results for input(s): TSH, T4TOTAL, FREET4, T3FREE, THYROIDAB in the last 72 hours. ?Lipid Profile: ?No results for input(s): CHOL, HDL, LDLCALC, TRIG, CHOLHDL, LDLDIRECT in the last 72 hours. ?Anemia Panel: ?No results for input(s): VITAMINB12, FOLATE, FERRITIN, TIBC, IRON, RETICCTPCT in the last 72 hours. ?Urine analysis: ?   ?Component Value Date/Time  ? [place] YELLOW 09/11/2021 0236  ? APPEARANCEUR CLEAR 09/11/2021 0236  ? LABSPEC 1.032 (H) 09/11/2021 0236  ? PHURINE 6.0 09/11/2021 0236  ? GLUCOSEU NEGATIVE 09/11/2021 0236  ? [place] NEGATIVE 09/11/2021 0236  ? [place] NEGATIVE 09/11/2021 0236  ? [place] NEGATIVE 09/11/2021 0236  ? PROTEINUR NEGATIVE 09/11/2021 0236  ? UROBILINOGEN 0.2 01/30/2010 1110  ? NITRITE NEGATIVE 09/11/2021 0236  ? LEUKOCYTESUR NEGATIVE 09/11/2021 0236  ? ?Sepsis Labs: ?Invalid input(s): PROCALCITONIN, LACTICIDVEN ? ?Microbiology: ?Recent  Results (from the past 240 hour(s))  ?Culture, body fluid w Gram Stain-bottle     Status: None  ? Collection Time: 09/10/21 11:56 AM  ? Specimen: Peritoneal Washings  ?Result Value Ref Range Status  ? Specimen Description PERITONEAL  Final  ? Special Requests NONE  Final  ? Culture   Final  ?  NO GROWTH 5 DAYS ?Performed at [facility], [place] [address], [place], [place] [address] ?  ? Report Status 09/15/2021 FINAL  Final  ?Gram stain     Status: None  ? Collection Time: 09/10/21 11:56 AM  ? Specimen: Peritoneal Washings  ?Result Value Ref Range Status  ? Specimen Description PERITONEAL  Final  ? Special Requests NONE  Final  ? Gram Stain   Final  ?  NO WBC SEEN ?NO ORGANISMS SEEN ?Performed at [facility], [place] [address], [place], [place] [address] ?  ? Report Status 09/10/2021 FINAL  Final  ? ? ?Radiology Studies: ?No results found. ? ? ? ?Ahnaf Caponi T. Gerre Ranum ?[facility] ? ?If 7PM-7AM, please contact night-coverage ?[email or web address] ?09/15/2021, 5:15 PM   ?

## 2021-09-15 NOTE — Progress Notes (Signed)
?3 Days Post-Op  ? ?Chief Complaint/Subjective: ?Pain moderate, +flatus, no diarrhea last night, tolerating liquids, ambulating ? ?Objective: ?Vital signs in last 24 hours: ?Temp:  [98.2 ?F (36.8 ?C)-98.4 ?F (36.9 ?C)] 98.2 ?F (36.8 ?C) (05/06 0750) ?Pulse Rate:  [79-88] 79 (05/06 0750) ?Resp:  [16-20] 16 (05/06 0750) ?BP: (104-116)/(54-64) 107/64 (05/06 0750) ?SpO2:  [95 %-98 %] 96 % (05/06 0750) ?Weight:  [72.5 kg] 72.5 kg (05/06 0427) ?Last BM Date : 09/10/21 ?Intake/Output from previous day: ?05/05 0701 - 05/06 0700 ?In: 390 [P.O.:390] ?Out: 300 [Urine:300] ?Intake/Output this shift: ?No intake/output data recorded. ? ?PE: ?Gen: NAd ?Resp: nonlabored ?Card: RRR ?Abd: soft, incision c/d/I, some ecchymosis left of incision ? ?Lab Results:  ?Recent Labs  ?  09/14/21 ?0117 09/15/21 ?0038  ?WBC 7.7 7.0  ?HGB 11.2* 11.0*  ?HCT 33.4* 31.8*  ?PLT 105* 107*  ? ?BMET ?Recent Labs  ?  09/13/21 ?9021 09/14/21 ?0117  ?NA 134* 135  ?K 4.8 5.4*  ?CL 105 104  ?CO2 22 21*  ?GLUCOSE 117* 83  ?BUN 23 28*  ?CREATININE 0.99 0.88  ?CALCIUM 8.9 8.9  ? ?PT/INR ?No results for input(s): LABPROT, INR in the last 72 hours. ?CMP  ?   ?Component Value Date/Time  ? NA 135 09/14/2021 0117  ? NA 140 12/02/2016 0949  ? K 5.4 (H) 09/14/2021 0117  ? K 4.4 12/02/2016 0949  ? CL 104 09/14/2021 0117  ? CO2 21 (L) 09/14/2021 0117  ? CO2 24 12/02/2016 0949  ? GLUCOSE 83 09/14/2021 0117  ? GLUCOSE 151 (H) 12/02/2016 0949  ? BUN 28 (H) 09/14/2021 0117  ? BUN 9.8 12/02/2016 0949  ? CREATININE 0.88 09/14/2021 0117  ? CREATININE 0.97 11/30/2020 0913  ? CREATININE 0.61 04/18/2017 1210  ? CREATININE 0.7 12/02/2016 0949  ? CALCIUM 8.9 09/14/2021 0117  ? CALCIUM 8.7 12/02/2016 0949  ? PROT 5.9 (L) 09/15/2021 0038  ? PROT 6.3 (L) 12/02/2016 0949  ? ALBUMIN 2.7 (L) 09/15/2021 0038  ? ALBUMIN 3.5 12/02/2016 0949  ? AST 26 09/15/2021 0038  ? AST 29 11/30/2020 0913  ? AST 34 12/02/2016 0949  ? ALT 17 09/15/2021 0038  ? ALT 17 11/30/2020 0913  ? ALT 29 12/02/2016  0949  ? ALKPHOS 67 09/15/2021 0038  ? ALKPHOS 62 12/02/2016 0949  ? BILITOT 1.0 09/15/2021 0038  ? BILITOT 0.8 11/30/2020 0913  ? BILITOT 0.60 12/02/2016 0949  ? GFRNONAA >60 09/14/2021 0117  ? GFRNONAA 59 (L) 11/30/2020 0913  ? GFRNONAA 87 04/18/2017 1210  ? GFRAA 55 (L) 12/01/2019 1319  ? GFRAA 101 04/18/2017 1210  ? ?Lipase  ?   ?Component Value Date/Time  ? LIPASE 30 09/11/2021 1633  ? ? ?Studies/Results: ?No results found. ? ?Anti-infectives: ?Anti-infectives (From admission, onward)  ? ? Start     Dose/Rate Route Frequency Ordered Stop  ? 09/12/21 0545  ceFAZolin (ANCEF) IVPB 2g/100 mL premix       ? 2 g ?200 mL/hr over 30 Minutes Intravenous  Once 09/12/21 0539 09/12/21 0737  ? ?  ? ? ?Assessment/Plan ? s/p Procedure(s): ?UMBILICAL HERNIA REPAIR ?SMALL BOWEL RESECTION 09/12/2021  ? ? ?FEN - advance diet ?VTE - lovenox ?ID - no issues ?Disposition - inpatient, AROBF ? ? LOS: 3 days  ? ?I reviewed last 24 h vitals and pain scores, last 48 h intake and output, last 24 h labs and trends, and last 24 h imaging results. ? ?This care required moderate level of medical decision making.  ? ?  Arta Bruce Allure Greaser ?[facility] ?09/15/2021, 8:25 AM ?Please see Amion for pager number during day hours 7:00am-4:30pm or 7:00am -11:30am on weekends ? ? ?

## 2021-09-15 NOTE — Assessment & Plan Note (Addendum)
Could be due to Aldactone and acute illness. ?-Reduced Aldactone to 100 mg daily. ?-Recheck in the morning ?

## 2021-09-16 DIAGNOSIS — K420 Umbilical hernia with obstruction, without gangrene: Secondary | ICD-10-CM | POA: Diagnosis not present

## 2021-09-16 DIAGNOSIS — K7460 Unspecified cirrhosis of liver: Secondary | ICD-10-CM | POA: Diagnosis not present

## 2021-09-16 DIAGNOSIS — K219 Gastro-esophageal reflux disease without esophagitis: Secondary | ICD-10-CM | POA: Diagnosis not present

## 2021-09-16 DIAGNOSIS — E119 Type 2 diabetes mellitus without complications: Secondary | ICD-10-CM | POA: Diagnosis not present

## 2021-09-16 LAB — FOLATE: Folate: 14.7 ng/mL (ref >5.9)

## 2021-09-16 LAB — HEPATIC FUNCTION PANEL
ALT: 18 U/L (ref 0–44)
AST: 28 U/L (ref 15–41)
Albumin: 2.6 g/dL — ABNORMAL LOW (ref 3.5–5.0)
Alkaline Phosphatase: 79 U/L (ref 38–126)
Bilirubin, Direct: 0.3 mg/dL — ABNORMAL HIGH (ref 0.0–0.2)
Indirect Bilirubin: 0.6 mg/dL (ref 0.3–0.9)
Total Bilirubin: 0.9 mg/dL (ref 0.3–1.2)
Total Protein: 5.5 g/dL — ABNORMAL LOW (ref 6.5–8.1)

## 2021-09-16 LAB — RENAL FUNCTION PANEL
Albumin: 2.5 g/dL — ABNORMAL LOW (ref 3.5–5.0)
Anion gap: 8 (ref 5–15)
BUN: 26 mg/dL — ABNORMAL HIGH (ref 8–23)
CO2: 23 mmol/L (ref 22–32)
Calcium: 8.5 mg/dL — ABNORMAL LOW (ref 8.9–10.3)
Chloride: 98 mmol/L (ref 98–111)
Creatinine, Ser: 0.92 mg/dL (ref 0.44–1.00)
GFR, Estimated: >60 mL/min (ref >60)
Glucose, Bld: 115 mg/dL — ABNORMAL HIGH (ref 70–99)
Phosphorus: 3.1 mg/dL (ref 2.5–4.6)
Potassium: 5 mmol/L (ref 3.5–5.1)
Sodium: 129 mmol/L — ABNORMAL LOW (ref 135–145)

## 2021-09-16 LAB — CBC
HCT: 29.7 % — ABNORMAL LOW (ref 36.0–46.0)
Hemoglobin: 10.5 g/dL — ABNORMAL LOW (ref 12.0–15.0)
MCH: 31.8 pg (ref 26.0–34.0)
MCHC: 35.4 g/dL (ref 30.0–36.0)
MCV: 90 fL (ref 80.0–100.0)
Platelets: 102 10*3/uL — ABNORMAL LOW (ref 150–400)
RBC: 3.3 MIL/uL — ABNORMAL LOW (ref 3.87–5.11)
RDW: 12.9 % (ref 11.5–15.5)
WBC: 6.1 10*3/uL (ref 4.0–10.5)
nRBC: 0 % (ref 0.0–0.2)

## 2021-09-16 LAB — FERRITIN: Ferritin: 113 ng/mL (ref 11–307)

## 2021-09-16 LAB — IRON AND TIBC
Iron: 18 ug/dL — ABNORMAL LOW (ref 28–170)
Saturation Ratios: 7 % — ABNORMAL LOW (ref 10.4–31.8)
TIBC: 245 ug/dL — ABNORMAL LOW (ref 250–450)
UIBC: 227 ug/dL

## 2021-09-16 LAB — RETICULOCYTES
Immature Retic Fract: 22.1 % — ABNORMAL HIGH (ref 2.3–15.9)
RBC.: 3.34 MIL/uL — ABNORMAL LOW (ref 3.87–5.11)
Retic Count, Absolute: 94.2 10*3/uL (ref 19.0–186.0)
Retic Ct Pct: 2.8 % (ref 0.4–3.1)

## 2021-09-16 LAB — MAGNESIUM: Magnesium: 1.7 mg/dL (ref 1.7–2.4)

## 2021-09-16 LAB — VITAMIN B12: Vitamin B-12: 522 pg/mL (ref 180–914)

## 2021-09-16 MED ORDER — SODIUM CHLORIDE 0.9 % IV SOLN: 250.0000 mg | Freq: Once | INTRAVENOUS | Status: DC

## 2021-09-16 MED ORDER — SPIRONOLACTONE 100 MG PO TABS
100.0000 mg | ORAL_TABLET | Freq: Every day | ORAL | Status: DC
  Administered 2021-09-16 – 2021-09-20 (×5): 100 mg via ORAL
  Filled 2021-09-16 (×5): qty 1

## 2021-09-16 MED ORDER — SODIUM CHLORIDE 0.9 % IV SOLN
250.0000 mg | Freq: Once | INTRAVENOUS | Status: AC
  Administered 2021-09-17: 250 mg via INTRAVENOUS
  Filled 2021-09-16: qty 20

## 2021-09-16 NOTE — Progress Notes (Signed)
Pt called this nurse to her room very upset that her son called screaming about someone having called him and told him that he was going to be the caregiver for his mom. She did not know who the person was as he had not mentioned to her who that person was. Made several calls to pt's son with no response. Eventually managed to speak to one of pt s family friends who reassured her that she would make efforts to reach pt's son and find out what he was upset about ?

## 2021-09-16 NOTE — Progress Notes (Signed)
?PROGRESS NOTE ? ?ANNELY SLIVA IDP:[id number] DOB: [birth year]  ? ?PCP: Vernie Shanks, MD ? ?Patient is from: Home. ? ?DOA: 09/11/2021 LOS: 4 ? ?Chief complaints ?Chief Complaint  ?Patient presents with  ? Abdominal Pain  ? Emesis  ?  ? 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  She underwent a small bowel resection and hernia repair on 09/12/2021.  Diet advanced by general surgery.  Per surgery, patient will need frequent paracentesis postop.  Plan for paracentesis on Monday (5/8).  OT recommends SNF although PT recommends no follow-up.  Needs reevaluation by therapy.   ? ?Subjective: ?Seen and examined earlier this morning.  No major events overnight of this morning.  She reports some abdominal pain with cough.  Some nausea after breakfast.  She thinks it is because she ate faster than she was supposed to.  No vomiting.  Passing gas but no bowel movement yet.  No chest pain or dyspnea. ? ?Objective: ?Vitals:  ? 09/15/21 1541 09/15/21 2032 09/16/21 0440 09/16/21 0900  ?BP: 109/63 (!) 110/58 118/67 113/64  ?Pulse: 80 81 78 81  ?Resp: 18 18 16 16   ?Temp: 97.9 ?F (36.6 ?C) 97.9 ?F (36.6 ?C) 98.3 ?F (36.8 ?C) 98.1 ?F (36.7 ?C)  ?TempSrc: Oral Oral Oral Oral  ?SpO2: 97% 97% 98% 97%  ?Weight:      ?Height:      ? ? ?Examination: ? ?GENERAL: No apparent distress.  Nontoxic. ?HEENT: MMM.  Vision and hearing grossly intact.  ?NECK: Supple.  No apparent JVD.  ?RESP:  No IWOB.  Fair aeration bilaterally. ?CVS:  RRR. Heart sounds normal.  ?ABD/GI/GU: BS+. Abd soft.  Mild discomfort with palpation.  Abdominal binder in place. ?MSK/EXT:  Moves extremities. No apparent deformity. No edema.  ?SKIN: no apparent skin lesion or wound ?NEURO: Awake and  alert. Oriented appropriately.  No apparent focal neuro deficit. ?PSYCH: Calm. Normal affect.  ? ?Procedures:  ?5/3-small bowel resection and umbilical hernia repair ? ?Microbiology summarized: ?None ? ?Assessment and Plan: ?* Incarcerated umbilical hernia ?S/p small bowel resection and umbilical hernia repair by Dr. Thermon Leyland on 5/3 ?-General surgery managing-on carb modified diet. ?-Per surgery, plan for paracentesis on Monday (5/8)  ?-Mobilize, binder... ? ?NASH Cirrhosis of liver with ascites ([place]) ?Continue home p.o. Lasix 40 mg daily and Aldactone 200 mg daily at home. ?-Per general surgery, needs frequent paracentesis.  One tentatively planned for Monday. ?-Continue home Lasix.  Reduced Aldactone to 100 mg daily due to hyponatremia ?-Sodium restriction. ? ?Physical deconditioning ?Acutely deconditioned due to current illness and hospitalization. ?-OT recommends SNF.  PT recommends no follow-up ?-Recommend reevaluation by therapy to clarify disposition.  TOC notified ? ?Hyponatremia ?Could be due to Aldactone and acute illness. ?-Reduced Aldactone to 100 mg daily. ?-Recheck in the morning ? ?Hyperkalemia ?Resolved. ? ?GERD (gastroesophageal reflux disease) ?Continue PPI ? 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  On low-dose metformin at home.  CBG within normal. ?-Monitor glucose with daily labs ? ?Thrombocytopenia ([place]) ?Recent Labs  ?Lab 09/11/21 ?1633 09/13/21 ?4431 09/14/21 ?0117 09/15/21 ?0038 09/16/21 ?5400  ?PLT 127* 115* 105* 107* 102*  ?-Likely due to liver cirrhosis and acute illness. ?-Continue monitoring ? ? ?Hypothyroidism ?Continue home Synthroid ? ? ?  ?DVT prophylaxis:  ?enoxaparin (LOVENOX) injection 40 mg Start: 09/13/21  0800 ? ?Code Status: Full code ?Family Communication: Patient and/or RN. Available if any question.  ?Level of care: Telemetry Surgical ?Status is: Inpatient ?Remains inpatient appropriate because: Incarcerated hernia and its complication ? ? ?Final  disposition: TBD ?Consultants:  ?General surgery ? ?Sch Meds:  ?Scheduled Meds: ? docusate sodium  100 mg Oral BID  ? enoxaparin (LOVENOX) injection  40 mg Subcutaneous Q24H  ? furosemide  40 mg Oral Daily  ? levothyroxine  50 mcg Oral Daily  ? montelukast  10 mg Oral QHS  ? pantoprazole  40 mg Oral BID  ? sodium chloride flush  3 mL Intravenous Q12H  ? spironolactone  100 mg Oral Daily  ? ?Continuous Infusions: ? methocarbamol (ROBAXIN) IV    ? ?PRN Meds:.albuterol, HYDROmorphone (DILAUDID) injection, methocarbamol (ROBAXIN) IV, ondansetron **OR** ondansetron (ZOFRAN) IV, oxyCODONE, oxyCODONE, prochlorperazine, simethicone ? ?Antimicrobials: ?Anti-infectives (From admission, onward)  ? ? Start     Dose/Rate Route Frequency Ordered Stop  ? 09/12/21 0545  ceFAZolin (ANCEF) IVPB 2g/100 mL premix       ? 2 g ?200 mL/hr over 30 Minutes Intravenous  Once 09/12/21 0539 09/12/21 0737  ? ?  ? ? ? ?I have personally reviewed the following labs and images: ?CBC: ?Recent Labs  ?Lab 09/11/21 ?1633 09/13/21 ?3646 09/14/21 ?0117 09/15/21 ?0038 09/16/21 ?8032  ?WBC 4.6 6.9 7.7 7.0 6.1  ?HGB 14.4 12.6 11.2* 11.0* 10.5*  ?HCT 41.8 37.6 33.4* 31.8* 29.7*  ?MCV 92.3 91.7 93.3 91.4 90.0  ?PLT 127* 115* 105* 107* 102*  ? ?BMP &GFR ?Recent Labs  ?Lab 09/11/21 ?1633 09/13/21 ?1224 09/14/21 ?0117 09/15/21 ?8250 09/15/21 ?0370 09/16/21 ?4888  ?NA 138 134* 135  --  131* 129*  ?K 4.3 4.8 5.4*  --  4.5 5.0  ?CL 106 105 104  --  97* 98  ?CO2 20* 22 21*  --  27 23  ?GLUCOSE 122* 117* 83  --  103* 115*  ?BUN 23 23 28*  --  29* 26*  ?CREATININE 0.90 0.99 0.88  --  0.95 0.92  ?CALCIUM 9.5 8.9 8.9  --  8.7* 8.5*  ?MG  --   --   --  1.8  --  1.7  ?PHOS  --   --   --  2.8 2.8 3.1  ? ?Estimated Creatinine Clearance: 47.3 mL/min (by C-G formula based on SCr of 0.92 mg/dL). ?Liver & Pancreas: ?Recent Labs  ?Lab 09/11/21 ?1633 09/13/21 ?9169 09/14/21 ?0117 09/15/21 ?4503 09/15/21 ?8882 09/16/21 ?8003  ?AST 32 34 29 26  --  28  ?ALT 19 21 13 17   --  18   ?ALKPHOS 82 69 59 67  --  79  ?BILITOT 0.8 1.1 1.6* 1.0  --  0.9  ?PROT 7.5 5.9* 5.7* 5.9*  --  5.5*  ?ALBUMIN 4.0 3.2* 3.0* 2.7* 2.8* 2.6*  2.5*  ? ?Recent Labs  ?Lab 09/11/21 ?1633  ?LIPASE 30  ? ?No results for input(s): AMMONIA in the last 168 hours. ?Diabetic: ?No results for input(s): HGBA1C in the last 72 hours. ?Recent Labs  ?Lab 09/12/21 ?0740 09/12/21 ?1056  ?GLUCAP 93 94  ? ?Cardiac Enzymes: ?No results for input(s): CKTOTAL, CKMB, CKMBINDEX, TROPONINI in the last 168 hours. ?No results for input(s): PROBNP in the last 8760 hours. ?Coagulation Profile: ?Recent Labs  ?Lab 09/12/21 ?0432  ?INR 1.2  ? ?Thyroid Function Tests: ?No results for input(s): TSH, T4TOTAL, FREET4, T3FREE, THYROIDAB in the last 72 hours. ?Lipid Profile: ?No results for input(s): CHOL, HDL,  LDLCALC, TRIG, CHOLHDL, LDLDIRECT in the last 72 hours. ?Anemia Panel: ?Recent Labs  ?  09/16/21 ?5597 09/16/21 ?1031  ?VITAMINB12  --  522  ?FOLATE 14.7  --   ?FERRITIN  --  113  ?TIBC  --  245*  ?IRON  --  18*  ?RETICCTPCT 2.8  --   ? ?Urine analysis: ?   ?Component Value Date/Time  ? [place] YELLOW 09/11/2021 0236  ? APPEARANCEUR CLEAR 09/11/2021 0236  ? LABSPEC 1.032 (H) 09/11/2021 0236  ? PHURINE 6.0 09/11/2021 0236  ? GLUCOSEU NEGATIVE 09/11/2021 0236  ? [place] NEGATIVE 09/11/2021 0236  ? [place] NEGATIVE 09/11/2021 0236  ? [place] NEGATIVE 09/11/2021 0236  ? PROTEINUR NEGATIVE 09/11/2021 0236  ? UROBILINOGEN 0.2 01/30/2010 1110  ? NITRITE NEGATIVE 09/11/2021 0236  ? LEUKOCYTESUR NEGATIVE 09/11/2021 0236  ? ?Sepsis Labs: ?Invalid input(s): PROCALCITONIN, LACTICIDVEN ? ?Microbiology: ?Recent Results (from the past 240 hour(s))  ?Culture, body fluid w Gram Stain-bottle     Status: None  ? Collection Time: 09/10/21 11:56 AM  ? Specimen: Peritoneal Washings  ?Result Value Ref Range Status  ? Specimen Description PERITONEAL  Final  ? Special Requests NONE  Final  ? Culture   Final  ?  NO GROWTH 5 DAYS ?Performed at [facility], [place] [address] [place], [place] [address] ?  ? Report Status 09/15/2021 FINAL  Final  ?Gram stain     Status: None  ? Collection Time: 09/10/21 11:56 AM  ? Specimen: Peritoneal Washings  ?Result Value Ref Range Status  ?

## 2021-09-17 ENCOUNTER — Inpatient Hospital Stay (HOSPITAL_COMMUNITY): Payer: Medicare Other

## 2021-09-17 ENCOUNTER — Telehealth: Payer: Self-pay

## 2021-09-17 DIAGNOSIS — K420 Umbilical hernia with obstruction, without gangrene: Secondary | ICD-10-CM | POA: Diagnosis not present

## 2021-09-17 LAB — HEPATIC FUNCTION PANEL
ALT: 17 U/L (ref 0–44)
AST: 26 U/L (ref 15–41)
Albumin: 2.4 g/dL — ABNORMAL LOW (ref 3.5–5.0)
Alkaline Phosphatase: 81 U/L (ref 38–126)
Bilirubin, Direct: 0.3 mg/dL — ABNORMAL HIGH (ref 0.0–0.2)
Indirect Bilirubin: 0.6 mg/dL (ref 0.3–0.9)
Total Bilirubin: 0.9 mg/dL (ref 0.3–1.2)
Total Protein: 5.3 g/dL — ABNORMAL LOW (ref 6.5–8.1)

## 2021-09-17 LAB — CBC
HCT: 28.8 % — ABNORMAL LOW (ref 36.0–46.0)
Hemoglobin: 10.2 g/dL — ABNORMAL LOW (ref 12.0–15.0)
MCH: 31.6 pg (ref 26.0–34.0)
MCHC: 35.4 g/dL (ref 30.0–36.0)
MCV: 89.2 fL (ref 80.0–100.0)
Platelets: 139 10*3/uL — ABNORMAL LOW (ref 150–400)
RBC: 3.23 MIL/uL — ABNORMAL LOW (ref 3.87–5.11)
RDW: 13 % (ref 11.5–15.5)
WBC: 5.6 10*3/uL (ref 4.0–10.5)
nRBC: 0 % (ref 0.0–0.2)

## 2021-09-17 LAB — RENAL FUNCTION PANEL
Albumin: 2.4 g/dL — ABNORMAL LOW (ref 3.5–5.0)
Anion gap: 7 (ref 5–15)
BUN: 21 mg/dL (ref 8–23)
CO2: 26 mmol/L (ref 22–32)
Calcium: 8.3 mg/dL — ABNORMAL LOW (ref 8.9–10.3)
Chloride: 97 mmol/L — ABNORMAL LOW (ref 98–111)
Creatinine, Ser: 0.74 mg/dL (ref 0.44–1.00)
GFR, Estimated: >60 mL/min (ref >60)
Glucose, Bld: 88 mg/dL (ref 70–99)
Phosphorus: 3.1 mg/dL (ref 2.5–4.6)
Potassium: 4.5 mmol/L (ref 3.5–5.1)
Sodium: 130 mmol/L — ABNORMAL LOW (ref 135–145)

## 2021-09-17 LAB — MAGNESIUM: Magnesium: 1.8 mg/dL (ref 1.7–2.4)

## 2021-09-17 MED ORDER — POLYETHYLENE GLYCOL 3350 17 G PO PACK
17.0000 g | PACK | Freq: Every day | ORAL | Status: DC
  Administered 2021-09-17 – 2021-09-18 (×2): 17 g via ORAL
  Filled 2021-09-17 (×2): qty 1

## 2021-09-17 MED ORDER — BISACODYL 10 MG RE SUPP
10.0000 mg | Freq: Once | RECTAL | Status: AC
  Administered 2021-09-17: 10 mg via RECTAL
  Filled 2021-09-17: qty 1

## 2021-09-17 MED ORDER — FERROUS SULFATE 325 (65 FE) MG PO TABS
325.0000 mg | ORAL_TABLET | Freq: Every day | ORAL | Status: DC
  Administered 2021-09-18 – 2021-09-20 (×3): 325 mg via ORAL
  Filled 2021-09-17 (×3): qty 1

## 2021-09-17 MED ORDER — METHOCARBAMOL 500 MG PO TABS: 500.0000 mg | ORAL_TABLET | Freq: Four times a day (QID) | ORAL | Status: DC | PRN

## 2021-09-17 NOTE — Progress Notes (Signed)
Interventional Radiology Brief Note: ? ?Patient brought to IR for possible therapeutic paracentesis.  Limited US Abdomen shows only a very small pocket of fluid not amenable to paracentesis at this time.  ? ?Brynda Greathouse, MS RD PA-C ?  ?

## 2021-09-17 NOTE — Progress Notes (Signed)
Patient ID: Crystal Barnett, female   DOB: [birth year], 81 y.o.   MRN: [id number] ?[facility] ?Progress Note ? ?5 Days Post-Op  ?Subjective: ?Pain well controlled. Passing flatus. No BM. Denies n/v. Eating great and clearing her plate. ? ?Objective: ?Vital signs in last 24 hours: ?Temp:  [97.7 ?F (36.5 ?C)-99 ?F (37.2 ?C)] 98.6 ?F (37 ?C) (05/08 0741) ?Pulse Rate:  [77-84] 78 (05/08 0741) ?Resp:  [16-17] 16 (05/08 0741) ?BP: (108-118)/(54-64) 118/62 (05/08 0741) ?SpO2:  [96 %-99 %] 96 % (05/08 0741) ?Last BM Date : 09/10/21 ? ?Intake/Output from previous day: ?05/07 0701 - 05/08 0700 ?In: 1225 [P.O.:1225] ?Out: 2150 [Urine:2150] ?Intake/Output this shift: ?No intake/output data recorded. ? ?PE: ?Gen:  Alert, NAD, pleasant ?Abd: soft, mild distension, mild global tenderness without rebound or guarding, as expected, + BS heard, incision cdi ? ?Lab Results:  ?Recent Labs  ?  09/16/21 ?4944 09/17/21 ?0132  ?WBC 6.1 5.6  ?HGB 10.5* 10.2*  ?HCT 29.7* 28.8*  ?PLT 102* 139*  ? ?BMET ?Recent Labs  ?  09/16/21 ?9675 09/17/21 ?0132  ?NA 129* 130*  ?K 5.0 4.5  ?CL 98 97*  ?CO2 23 26  ?GLUCOSE 115* 88  ?BUN 26* 21  ?CREATININE 0.92 0.74  ?CALCIUM 8.5* 8.3*  ? ?PT/INR ?No results for input(s): LABPROT, INR in the last 72 hours. ? ?CMP  ?   ?Component Value Date/Time  ? NA 130 (L) 09/17/2021 0132  ? NA 140 12/02/2016 0949  ? K 4.5 09/17/2021 0132  ? K 4.4 12/02/2016 0949  ? CL 97 (L) 09/17/2021 0132  ? CO2 26 09/17/2021 0132  ? CO2 24 12/02/2016 0949  ? GLUCOSE 88 09/17/2021 0132  ? GLUCOSE 151 (H) 12/02/2016 0949  ? BUN 21 09/17/2021 0132  ? BUN 9.8 12/02/2016 0949  ? CREATININE 0.74 09/17/2021 0132  ? CREATININE 0.97 11/30/2020 0913  ? CREATININE 0.61 04/18/2017 1210  ? CREATININE 0.7 12/02/2016 0949  ? CALCIUM 8.3 (L) 09/17/2021 0132  ? CALCIUM 8.7 12/02/2016 0949  ? PROT 5.3 (L) 09/17/2021 0132  ? PROT 6.3 (L) 12/02/2016 0949  ? ALBUMIN 2.4 (L) 09/17/2021 0132  ? ALBUMIN 2.4 (L) 09/17/2021 0132  ? ALBUMIN 3.5  12/02/2016 0949  ? AST 26 09/17/2021 0132  ? AST 29 11/30/2020 0913  ? AST 34 12/02/2016 0949  ? ALT 17 09/17/2021 0132  ? ALT 17 11/30/2020 0913  ? ALT 29 12/02/2016 0949  ? ALKPHOS 81 09/17/2021 0132  ? ALKPHOS 62 12/02/2016 0949  ? BILITOT 0.9 09/17/2021 0132  ? BILITOT 0.8 11/30/2020 0913  ? BILITOT 0.60 12/02/2016 0949  ? GFRNONAA >60 09/17/2021 0132  ? GFRNONAA 59 (L) 11/30/2020 0913  ? GFRNONAA 87 04/18/2017 1210  ? GFRAA 55 (L) 12/01/2019 1319  ? GFRAA 101 04/18/2017 1210  ? ?Lipase  ?   ?Component Value Date/Time  ? LIPASE 30 09/11/2021 1633  ? ? ? ? ? ?Studies/Results: ?No results found. ? ?Anti-infectives: ?Anti-infectives (From admission, onward)  ? ? Start     Dose/Rate Route Frequency Ordered Stop  ? 09/12/21 0545  ceFAZolin (ANCEF) IVPB 2g/100 mL premix       ? 2 g ?200 mL/hr over 30 Minutes Intravenous  Once 09/12/21 0539 09/12/21 0737  ? ?  ? ? ? ?Assessment/Plan ?Incarcerated umbilical hernia (with 1 cm x 1 cm fascial defect) with strangulated, necrotic small intestine ?-POD#5 S/p UMBILICAL HERNIA REPAIR, SMALL BOWEL RESECTION: [id number] 5/3 [place] ?- carb mod diet ?-pain controlled ?- continue  abdominal binder ?- supposed plan for para today per prior noteds ?-awaiting placement. ?-surgically stable for placement when obtained.  She has follow up already in place otherwise. ?  ?ID - ancef periop ?FEN - IVF, carb mod diet ?VTE - lovenox ?Foley - none ?  ?Hyperkalemia -improved ?NASH cirrhosis ?Thrombocytopenia ?DM ?Hypothyroidism ?GERD ? ? ? LOS: 5 days  ? ? ?Henreitta Cea, PA-C ?[facility] ?09/17/2021, 9:23 AM ?Please see Amion for pager number during day hours 7:00am-4:30pm ? ?

## 2021-09-17 NOTE — Telephone Encounter (Signed)
Noted  

## 2021-09-17 NOTE — Telephone Encounter (Signed)
Pt called to let us know she will not be able to come for labs on the 15th. She went to the ER on 5/3 and had surgery for an incarcerated hernia. Dr. Henrene Pastor notified. ?

## 2021-09-17 NOTE — Telephone Encounter (Signed)
Reviewed. ?She can have repeat labs as previously planned, one week after hospital discharge. ?Thanks  ?

## 2021-09-17 NOTE — Progress Notes (Signed)
?PROGRESS NOTE ? ?Crystal Barnett  YFV:[id number] DOB: 1941/05/13 DOA: 09/11/2021 ?PCP: Vernie Shanks, MD  ? ?Brief Narrative: ?Patient is a 81 year old female with history of hypertension, [place] cirrhosis, thrombocytopenia, diabetes type 2, hypothyroidism, GERD umbilical hernia who presented with acute onset of abdominal pain, nausea, vomiting, lack of bowel movement.  On presentation she was hemodynamically stable.  CT abdomen/pelvis showed large umbilical hernia containing ascites, dilated small bowel with a transition point for small bowel obstruction, concern for strangulated/incarcerated hernia.  Patient was started on IV fluids, IV antibiotics, pain medications.  General surgery consulted.  Underwent umbilical hernia repair, small bowel resection on 5/3.  OT recommending skilled nursing facility on discharge.  Waiting for updated PT/OT evaluation for discharge planning. ? ? ?Assessment & Plan: ? ?Principal Problem: ?  Incarcerated umbilical hernia ?Active Problems: ?  NASH Cirrhosis of liver with ascites ([place]) ?  Hypothyroidism ?  Thrombocytopenia ([place]) ?  Controlled NIDDM-2 without complication ?  GERD (gastroesophageal reflux disease) ?  Hyperkalemia ?  Hyponatremia ?  Physical deconditioning ? ? ?Incarcerated umbilical hernia: Presented with abdomen pain, nausea, vomiting, lack of bowel movement.CT abdomen/pelvis showed large umbilical hernia containing ascites, dilated small bowel with a transition point for small bowel obstruction, concern for strangulated/incarcerated hernia.  Patient was started on IV fluids, IV antibiotics, pain medications.  General surgery consulted.  Underwent umbilical hernia repair, small bowel resection on 5/3. S/p return of  bowel function. ?Continue supportive care, pain management.  General surgery recommended outpatient follow up. ? ?NASH cirrhosis/ascites: Undergoes regular paracentesis as an outpatient.  Takes spironolactone, furosemide at home. she needs paracentesis here to  promote wound healing/prevent abdominal distention.Plan for paracentesis today. ? ?Thrombocytopenia: Chronic, stable.  Secondary to [name] ? ?Diabetes type 2: Well-controlled.  Takes metformin 500 mg daily at home.  Continue sliding scale insulin here.  Monitor blood sugars ? ?GERD: Continue Protonix ? ?Hyponatremia: Secondary to hypervolemic hyponatremia from cirrhosis.  Continue diuretics.  Continue to monitor. ? ?Iron deficiency anemia: Hemoglobin currently stable in the range of 10-11.  Given IV iron.  Continue oral iron supplementation on discharge ? ?Hypothyroidism: Continue levothyroxine.  No evidence of acute blood loss ? ?Debility/deconditioning: PT consulted.  OT recommended skilled nursing facility.  Waiting for updated recommendation from therapies. ? ? ?  ? ? ?  ?  ? ?DVT prophylaxis:enoxaparin (LOVENOX) injection 40 mg Start: 09/13/21 0800 ? ? ?  Code Status: Full Code ? ?Family Communication: None at bedside ? ?Patient status:Inpatient ? ?Patient is from :Home ? ?Anticipated discharge to:SNF vs HH ? ?Estimated DC date:After surgical clearance, return of bowel function,paracentesis ? ? ?Consultants: Surgery ? ?Procedures: Hernia repair, small bowel resection ? ?Antimicrobials:  ?Anti-infectives (From admission, onward)  ? ? Start     Dose/Rate Route Frequency Ordered Stop  ? 09/12/21 0545  ceFAZolin (ANCEF) IVPB 2g/100 mL premix       ? 2 g ?200 mL/hr over 30 Minutes Intravenous  Once 09/12/21 0539 09/12/21 0737  ? ?  ? ? ?Subjective: ?Patient seen and examined at the bedside this morning.  Hemodynamically stable. Overall comfortable,no complain of abd pain,nausea or vomiting. No BM after surgery. Abd appears distended ? ?Objective: ?Vitals:  ? 09/16/21 1652 09/16/21 2016 09/17/21 1638 09/17/21 0741  ?BP: 117/62 (!) 112/54 108/64 118/62  ?Pulse: 77 84 77 78  ?Resp: 17 16 16 16   ?Temp: 98.2 ?F (36.8 ?C) 99 ?F (37.2 ?C) 97.7 ?F (36.5 ?C) 98.6 ?F (37 ?C)  ?TempSrc: Oral Oral Oral  Oral  ?SpO2: 99% 96% 96%  96%  ?Weight:      ?Height:      ? ? ?Intake/Output Summary (Last 24 hours) at 09/17/2021 1301 ?Last data filed at 09/17/2021 9678 ?Gross per 24 hour  ?Intake 1100 ml  ?Output 2150 ml  ?Net -1050 ml  ? ?Filed Weights  ? 09/12/21 0754 09/14/21 0437 09/15/21 0427  ?Weight: 75.3 kg 71.4 kg 72.5 kg  ? ? ?Examination: ? ?General exam: Overall comfortable, not in distress,obese ?HEENT: PERRL ?Respiratory system:  no wheezes or crackles  ?Cardiovascular system: S1 & S2 heard, RRR.  ?Gastrointestinal system: Abdomen is distended, soft and nontender.Midline surgical wound.BS present ?Central nervous system: Alert and oriented ?Extremities: No edema, no clubbing ,no cyanosis ?Skin: No rashes, no ulcers,no icterus   ? ? ?Data Reviewed: I have personally reviewed following labs and imaging studies ? ?CBC: ?Recent Labs  ?Lab 09/13/21 ?9381 09/14/21 ?0117 09/15/21 ?0038 09/16/21 ?0175 09/17/21 ?0132  ?WBC 6.9 7.7 7.0 6.1 5.6  ?HGB 12.6 11.2* 11.0* 10.5* 10.2*  ?HCT 37.6 33.4* 31.8* 29.7* 28.8*  ?MCV 91.7 93.3 91.4 90.0 89.2  ?PLT 115* 105* 107* 102* 139*  ? ?Basic Metabolic Panel: ?Recent Labs  ?Lab 09/13/21 ?1025 09/14/21 ?0117 09/15/21 ?8527 09/15/21 ?7824 09/16/21 ?2353 09/17/21 ?0132  ?NA 134* 135  --  131* 129* 130*  ?K 4.8 5.4*  --  4.5 5.0 4.5  ?CL 105 104  --  97* 98 97*  ?CO2 22 21*  --  27 23 26   ?GLUCOSE 117* 83  --  103* 115* 88  ?BUN 23 28*  --  29* 26* 21  ?CREATININE 0.99 0.88  --  0.95 0.92 0.74  ?CALCIUM 8.9 8.9  --  8.7* 8.5* 8.3*  ?MG  --   --  1.8  --  1.7 1.8  ?PHOS  --   --  2.8 2.8 3.1 3.1  ? ? ? ?Recent Results (from the past 240 hour(s))  ?Culture, body fluid w Gram Stain-bottle     Status: None  ? Collection Time: 09/10/21 11:56 AM  ? Specimen: Peritoneal Washings  ?Result Value Ref Range Status  ? Specimen Description PERITONEAL  Final  ? Special Requests NONE  Final  ? Culture   Final  ?  NO GROWTH 5 DAYS ?Performed at [facility], [place] [address] [place], [place] [address] ?  ? Report Status  09/15/2021 FINAL  Final  ?Gram stain     Status: None  ? Collection Time: 09/10/21 11:56 AM  ? Specimen: Peritoneal Washings  ?Result Value Ref Range Status  ? Specimen Description PERITONEAL  Final  ? Special Requests NONE  Final  ? Gram Stain   Final  ?  NO WBC SEEN ?NO ORGANISMS SEEN ?Performed at [facility], [place] [address] [place], [place] [address] ?  ? Report Status 09/10/2021 FINAL  Final  ?  ? ?Radiology Studies: ?No results found. ? ?Scheduled Meds: ? docusate sodium  100 mg Oral BID  ? enoxaparin (LOVENOX) injection  40 mg Subcutaneous Q24H  ? furosemide  40 mg Oral Daily  ? levothyroxine  50 mcg Oral Daily  ? montelukast  10 mg Oral QHS  ? pantoprazole  40 mg Oral BID  ? polyethylene glycol  17 g Oral Daily  ? sodium chloride flush  3 mL Intravenous Q12H  ? spironolactone  100 mg Oral Daily  ? ?Continuous Infusions: ? ? ? ? LOS: 5 days  ? ?Candace Begue  Tawanna Solo, MD ?[facility] ?P5/12/2021, 1:01 PM   ?

## 2021-09-17 NOTE — Progress Notes (Signed)
Mobility Specialist Progress Note  ? ? 09/17/21 1236  ?Mobility  ?Activity Ambulated with assistance in hallway  ?Level of Assistance +2 (takes two people) ?(chair follow)  ?Assistive Device Front wheel walker  ?Distance Ambulated (ft) 115 ft  ?Activity Response Tolerated well  ?$Mobility charge 1 Mobility  ? ?Pt received in bed and agreeable. Pt minA for bed mobility and standing. No complaints on walk. Rolled back in chair and left with call bell in reach.   ? ?Crystal Barnett ?Mobility Specialist  ?Primary: 5N M.S. Phone: [phone] ?Secondary: 6N M.S. Phone: [phone] ?  ?

## 2021-09-17 NOTE — Progress Notes (Signed)
Physical Therapy Treatment ?Patient Details ?Name: Crystal Barnett ?MRN: [id number] ?DOB: Apr 08, 1941 ?Today's Date: 09/17/2021 ? ? ?History of Present Illness Pt is an 81 y.o. female admitted 09/11/21 with abdominal pain, nausea/vomiting, constipation. Workup for incarcerated umbilical hernia s/p hernia repair and small bowel resection 5/3. Pt also with ascites; plan to have IR perform paracentesis 5/4 if ascites accumulating. PMH includes DM, hepatic cirrhosis, osteoporosis, cataract, liver CA. ? ?  ?PT Comments  ? ? Pt was seen for bed ex due to her discomfort with abdomen, resulting in reluctance to get up and walk.  Pt was later taken for paracentesis, but in the mean time had routine of there ex to BLE's.  Pt is updated to go to SNF care now based on her extreme fatigue with being up earlier, inability to get up again and low general strength.  Pt is home alone and will not be able currently to make her own way yet.  Follow acutely for strengthening, increasing endurance and progressing balance skills to meet her goal for independent gait for home. Encourage OOB to chair as tolerated to assist with these goals for PT and for home.   ?Recommendations for follow up therapy are one component of a multi-disciplinary discharge planning process, led by the attending physician.  Recommendations may be updated based on patient status, additional functional criteria and insurance authorization. ? ?Follow Up Recommendations ? Skilled nursing-short term rehab (<3 hours/day) ?  ?  ?Assistance Recommended at Discharge Intermittent Supervision/Assistance  ?Patient can return home with the following A little help with walking and/or transfers;A little help with bathing/dressing/bathroom;Assistance with cooking/housework;Direct supervision/assist for medications management;Assist for transportation;Help with stairs or ramp for entrance ?  ?Equipment Recommendations ? None recommended by PT  ?  ?Recommendations for Other Services    ? ? ?  ?Precautions / Restrictions Precautions ?Precautions: Fall;Other (comment) ?Precaution Comments: abdominal precautions for comfort (has abdominal binder); urine incontinence ?Required Braces or Orthoses: Other Brace (abd binder) ?Restrictions ?Weight Bearing Restrictions: No  ?  ? ?Mobility ? Bed Mobility ?  ?  ?  ?  ?  ?  ?  ?General bed mobility comments: declined OOB ?  ? ?Transfers ?  ?  ?  ?  ?  ?  ?  ?  ?  ?  ?  ? ?Ambulation/Gait ?  ?  ?  ?  ?  ?  ?  ?  ? ? ?Stairs ?  ?  ?  ?  ?  ? ? ?Wheelchair Mobility ?  ? ?Modified Rankin (Stroke Patients Only) ?  ? ? ?  ?Balance   ?  ?  ?  ?  ?  ?  ?  ?  ?  ?  ?  ?  ?  ?  ?  ?  ?  ?  ?  ? ?  ?Cognition Arousal/Alertness: Awake/alert, Lethargic ?Behavior During Therapy: [facility] for tasks assessed/performed ?Overall Cognitive Status: Within Functional Limits for tasks assessed ?  ?  ?  ?  ?  ?  ?  ?  ?  ?  ?  ?  ?  ?  ?  ?  ?  ?  ?  ? ?  ?Exercises General Exercises - Lower Extremity ?Ankle Circles/Pumps: AAROM, 5 reps ?Quad Sets: AROM, 10 reps ?Gluteal Sets: AROM, 10 reps ?Heel Slides: AAROM, 10 reps ?Hip ABduction/ADduction: AAROM, 10 reps ? ?  ?General Comments General comments (skin integrity, edema, etc.): pt was seen for mobility but was  unable to tolerate that much movement.  Pt is falling asleep, specifically reports getting up earlier was very fatiguing and does not feel well, abd is tight awaiting paracentesis ?  ?  ? ?Pertinent Vitals/Pain Pain Assessment ?Pain Assessment: Faces ?Faces Pain Scale: Hurts little more ?Pain Location: abdomen ?Pain Descriptors / Indicators: Grimacing, Guarding ?Pain Intervention(s): Monitored during session, Repositioned  ? ? ?Home Living   ?  ?  ?  ?  ?  ?  ?  ?  ?  ?   ?  ?Prior Function    ?  ?  ?   ? ?PT Goals (current goals can now be found in the care plan section) Acute Rehab PT Goals ?Patient Stated Goal: return home ? ?  ?Frequency ? ? ? Min 3X/week ? ? ? ?  ?PT Plan Discharge plan needs to be updated   ? ? ?Co-evaluation   ?  ?  ?  ?  ? ?  ?AM-PAC PT "6 Clicks" Mobility   ?Outcome Measure ? Help needed turning from your back to your side while in a flat bed without using bedrails?: None ?Help needed moving from lying on your back to sitting on the side of a flat bed without using bedrails?: A Little ?Help needed moving to and from a bed to a chair (including a wheelchair)?: A Little ?Help needed standing up from a chair using your arms (e.g., wheelchair or bedside chair)?: A Little ?Help needed to walk in hospital room?: A Little ?Help needed climbing 3-5 steps with a railing? : A Lot ?6 Click Score: 18 ? ?  ?End of Session   ?Activity Tolerance: Patient tolerated treatment well;Patient limited by fatigue ?Patient left: with call bell/phone within reach;in bed;with bed alarm set ?Nurse Communication: Mobility status ?PT Visit Diagnosis: Other abnormalities of gait and mobility (R26.89);Muscle weakness (generalized) (M62.81);Pain ?Pain - Right/Left:  (abdomen) ?Pain - part of body:  (abdomen) ?  ? ? ?Time: [id number] ?PT Time Calculation (min) (ACUTE ONLY): 19 min ? ?Charges:  $Therapeutic Exercise: 23-37 mins  ?Ramond Dial ?09/17/2021, 4:49 PM ? ?Mee Hives, PT PhD ?Acute Rehab Dept. Number: [facility] [phone] and [facility] [phone] ? ? ?

## 2021-09-18 DIAGNOSIS — K420 Umbilical hernia with obstruction, without gangrene: Secondary | ICD-10-CM | POA: Diagnosis not present

## 2021-09-18 MED ORDER — POLYETHYLENE GLYCOL 3350 17 G PO PACK
17.0000 g | PACK | Freq: Two times a day (BID) | ORAL | Status: DC
  Administered 2021-09-18 – 2021-09-19 (×2): 17 g via ORAL
  Filled 2021-09-18 (×4): qty 1

## 2021-09-18 MED ORDER — SORBITOL 70 % SOLN
960.0000 mL | TOPICAL_OIL | Freq: Once | ORAL | Status: AC
  Administered 2021-09-18: 960 mL via RECTAL
  Filled 2021-09-18: qty 473

## 2021-09-18 MED ORDER — SORBITOL 70 % SOLN
960.0000 mL | TOPICAL_OIL | Freq: Once | ORAL | Status: DC
  Filled 2021-09-18: qty 473

## 2021-09-18 NOTE — Progress Notes (Signed)
Mobility Specialist Progress Note: ? ? 09/18/21 1148  ?Mobility  ?Activity Ambulated with assistance in hallway  ?Level of Assistance Standby assist, set-up cues, supervision of patient - no hands on  ?Assistive Device Front wheel walker  ?Distance Ambulated (ft) 120 ft  ?Activity Response Tolerated well  ?$Mobility charge 1 Mobility  ? ?Pt received in bed willing to participate in mobility. Complaints of abdominal pain. Left in bed with call bell in reach and all needs met.  ? ?Wladyslawa Disbro ?Mobility Specialist ?Primary Phone [phone] ? ?

## 2021-09-18 NOTE — Progress Notes (Signed)
?PROGRESS NOTE ? ?Crystal Barnett  YIA:[id number] DOB: 1940-10-15 DOA: 09/11/2021 ?PCP: Vernie Shanks, MD  ? ?Brief Narrative: ?Patient is a 81 year old female with history of hypertension, [place] cirrhosis, thrombocytopenia, diabetes type 2, hypothyroidism, GERD umbilical hernia who presented with acute onset of abdominal pain, nausea, vomiting, lack of bowel movement.  On presentation she was hemodynamically stable.  CT abdomen/pelvis showed large umbilical hernia containing ascites, dilated small bowel with a transition point for small bowel obstruction, concern for strangulated/incarcerated hernia.  Patient was started on IV fluids, IV antibiotics, pain medications.  General surgery consulted.  Underwent umbilical hernia repair, small bowel resection on 5/3.  PT/OT recommending skilled nursing facility on discharge.  Medically stable for discharge. ? ? ?Assessment & Plan: ? ?Principal Problem: ?  Incarcerated umbilical hernia ?Active Problems: ?  NASH Cirrhosis of liver with ascites ([place]) ?  Hypothyroidism ?  Thrombocytopenia ([place]) ?  Controlled NIDDM-2 without complication ?  GERD (gastroesophageal reflux disease) ?  Hyperkalemia ?  Hyponatremia ?  Physical deconditioning ? ? ?Incarcerated umbilical hernia: Presented with abdomen pain, nausea, vomiting, lack of bowel movement.CT abdomen/pelvis showed large umbilical hernia containing ascites, dilated small bowel with a transition point for small bowel obstruction, concern for strangulated/incarcerated hernia.  Patient was started on IV fluids, IV antibiotics, pain medications.  General surgery consulted.  Underwent umbilical hernia repair, small bowel resection on 5/3.  Surgery cleared for discharge, still no bowel movement though, has good bowel sounds.  Continue bowel regimen. ?Continue supportive care, pain management.  General surgery recommended outpatient follow up. ? ?NASH cirrhosis/ascites: Undergoes regular paracentesis as an outpatient.  Takes  spironolactone, furosemide at home. she needs regular paracentesis here to promote wound healing/prevent abdominal distention.paracentesis attempted on 5/8 but aborted because of no significant fluid ? ?Thrombocytopenia: Chronic, stable.  Secondary to [name] ? ?Diabetes type 2: Well-controlled.  Takes metformin 500 mg daily at home.  Continue sliding scale insulin here.  Monitor blood sugars ? ?GERD: Continue Protonix ? ?Hyponatremia: Secondary to hypervolemic hyponatremia from cirrhosis.  Continue diuretics.  Continue to monitor. ? ?Iron deficiency anemia: Hemoglobin currently stable in the range of 10-11.  Given IV iron.  Continue oral iron supplementation on discharge ? ?Hypothyroidism: Continue levothyroxine.  No evidence of acute blood loss ? ?Debility/deconditioning: PT / OT recommended skilled nursing facility.  TOC following ? ? ? ?  ?  ? ?DVT prophylaxis:enoxaparin (LOVENOX) injection 40 mg Start: 09/13/21 0800 ? ? ?  Code Status: Full Code ? ?Family Communication: Called son multiple times without success/calls not received ? ?Patient status:Inpatient ? ?Patient is from :Home ? ?Anticipated discharge to:SNF  ? ?Estimated DC date: As soon as possible when bed is available at SNF ? ? ?Consultants: Surgery ? ?Procedures: Hernia repair, small bowel resection ? ?Antimicrobials:  ?Anti-infectives (From admission, onward)  ? ? Start     Dose/Rate Route Frequency Ordered Stop  ? 09/12/21 0545  ceFAZolin (ANCEF) IVPB 2g/100 mL premix       ? 2 g ?200 mL/hr over 30 Minutes Intravenous  Once 09/12/21 0539 09/12/21 0737  ? ?  ? ? ?Subjective: ? ?Patient seen and examined at the bedside this morning.  Hemodynamically stable.  She did not have any bowel movement since last many days.  She does not complain of any abdomen pain, nausea or vomiting.  She is passing gas.  Tolerating diet.  Denies any new complaints today ?Objective: ?Vitals:  ? 09/17/21 2031 09/18/21 0500 09/18/21 0533 09/18/21 0805  ?BP: Marland Kitchen)  115/56  116/61  118/67  ?Pulse: 80  74 83  ?Resp: 17  16 18   ?Temp: 98.5 ?F (36.9 ?C)  98.2 ?F (36.8 ?C) 97.8 ?F (36.6 ?C)  ?TempSrc: Oral  Oral   ?SpO2: 98%  97% 97%  ?Weight:  73.4 kg    ?Height:      ? ? ?Intake/Output Summary (Last 24 hours) at 09/18/2021 1129 ?Last data filed at 09/18/2021 0900 ?Gross per 24 hour  ?Intake 240 ml  ?Output 300 ml  ?Net -60 ml  ? ?Filed Weights  ? 09/14/21 0437 09/15/21 0427 09/18/21 0500  ?Weight: 71.4 kg 72.5 kg 73.4 kg  ? ? ?Examination: ? ?General exam: Overall comfortable, not in distress, deconditioned ?HEENT: PERRL ?Respiratory system:  no wheezes or crackles  ?Cardiovascular system: S1 & S2 heard, RRR.  ?Gastrointestinal system: Abdomen is distended, soft and nontender.midline surgical wound.  Bowel sounds present ?Central nervous system: Alert and oriented ?Extremities: No edema, no clubbing ,no cyanosis ?Skin: No rashes, no ulcers,no icterus   ? ? ?Data Reviewed: I have personally reviewed following labs and imaging studies ? ?CBC: ?Recent Labs  ?Lab 09/13/21 ?9924 09/14/21 ?0117 09/15/21 ?0038 09/16/21 ?2683 09/17/21 ?0132  ?WBC 6.9 7.7 7.0 6.1 5.6  ?HGB 12.6 11.2* 11.0* 10.5* 10.2*  ?HCT 37.6 33.4* 31.8* 29.7* 28.8*  ?MCV 91.7 93.3 91.4 90.0 89.2  ?PLT 115* 105* 107* 102* 139*  ? ?Basic Metabolic Panel: ?Recent Labs  ?Lab 09/13/21 ?4196 09/14/21 ?0117 09/15/21 ?2229 09/15/21 ?7989 09/16/21 ?2119 09/17/21 ?0132  ?NA 134* 135  --  131* 129* 130*  ?K 4.8 5.4*  --  4.5 5.0 4.5  ?CL 105 104  --  97* 98 97*  ?CO2 22 21*  --  27 23 26   ?GLUCOSE 117* 83  --  103* 115* 88  ?BUN 23 28*  --  29* 26* 21  ?CREATININE 0.99 0.88  --  0.95 0.92 0.74  ?CALCIUM 8.9 8.9  --  8.7* 8.5* 8.3*  ?MG  --   --  1.8  --  1.7 1.8  ?PHOS  --   --  2.8 2.8 3.1 3.1  ? ? ? ?Recent Results (from the past 240 hour(s))  ?Culture, body fluid w Gram Stain-bottle     Status: None  ? Collection Time: 09/10/21 11:56 AM  ? Specimen: Peritoneal Washings  ?Result Value Ref Range Status  ? Specimen Description PERITONEAL  Final  ?  Special Requests NONE  Final  ? Culture   Final  ?  NO GROWTH 5 DAYS ?Performed at [facility], [place] [address], [place], [place] [address] ?  ? Report Status 09/15/2021 FINAL  Final  ?Gram stain     Status: None  ? Collection Time: 09/10/21 11:56 AM  ? Specimen: Peritoneal Washings  ?Result Value Ref Range Status  ? Specimen Description PERITONEAL  Final  ? Special Requests NONE  Final  ? Gram Stain   Final  ?  NO WBC SEEN ?NO ORGANISMS SEEN ?Performed at [facility], [place] [address] [place],  [address] ?  ? Report Status 09/10/2021 FINAL  Final  ?  ? ?Radiology Studies: ?IR ABDOMEN US LIMITED ? ?Result Date: 09/17/2021 ?CLINICAL DATA:  Abdominal distension, history of ascites, assess for paracentesis EXAM: LIMITED ABDOMEN ULTRASOUND FOR ASCITES TECHNIQUE: Limited ultrasound survey for ascites was performed in all four abdominal quadrants. COMPARISON:  09/10/2021 FINDINGS: Ultrasound of the abdominal 4 quadrants demonstrates a small amount of lower abdominopelvic ascites.  There is not enough fluid to warrant therapeutic paracentesis. Procedure not performed. IMPRESSION: Small amount of lower abdominopelvic ascites. Electronically Signed   By: Jerilynn Mages.  Shick M.D.   On: 09/17/2021 16:38   ? ?Scheduled Meds: ? docusate sodium  100 mg Oral BID  ? enoxaparin (LOVENOX) injection  40 mg Subcutaneous Q24H  ? ferrous sulfate  325 mg Oral Q breakfast  ? furosemide  40 mg Oral Daily  ? levothyroxine  50 mcg Oral Daily  ? montelukast  10 mg Oral QHS  ? pantoprazole  40 mg Oral BID  ? polyethylene glycol  17 g Oral Daily  ? sodium chloride flush  3 mL Intravenous Q12H  ? spironolactone  100 mg Oral Daily  ? ?Continuous Infusions: ? ? ? ? LOS: 6 days  ? ?Shelly Coss, MD ?[facility] ?P5/01/2022, 11:29 AM   ?

## 2021-09-18 NOTE — [place] FL2 (Signed)
?[place] MEDICAID FL2 LEVEL OF CARE SCREENING TOOL  ?  ? ?IDENTIFICATION  ?Patient Name: ?Crystal Barnett Birthdate: 1941-01-24 Sex: female Admission Date (Current Location): ?09/11/2021  ?[place] and [place] Number: ? [place] ?  Facility and Address:  ?[facility], [place] [address], [place], [place] [address] ?     Provider Number: ?[id number]  ?Attending Physician Name and Address:  ?Shelly Coss, MD ? Relative Name and Phone Number:  ?  ?   ?Current Level of Care: ?Hospital Recommended Level of Care: ?[place] Prior Approval Number: ?  ? ?Date Approved/Denied: ?  PASRR Number: ?[id number] A ? ?Discharge Plan: ?SNF ?  ? ?Current Diagnoses: ?Patient Active Problem List  ? Diagnosis Date Noted  ? Hyponatremia 09/15/2021  ? Physical deconditioning 09/15/2021  ? Hyperkalemia 09/14/2021  ? Incarcerated umbilical hernia 61/53/7943  ? Controlled NIDDM-2 without complication 27/61/4709  ? GERD (gastroesophageal reflux disease) 09/12/2021  ? Menopausal syndrome 04/16/2018  ? Morbid obesity with BMI of 40.0-44.9, adult (Standard) 04/01/2016  ? Abnormal finding on mammography 08/29/2015  ? 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 Lesion of vulva 06/19/2015  ? Thrombocytopenia ([place]) 04/19/2015  ? Liver lesion, left lobe 04/19/2015  ? NASH Cirrhosis of liver with ascites ([place]) 04/19/2015  ? Arrhythmia 04/23/2013  ? Uterine leiomyoma 01/18/2010  ? Abnormal cervical Papanicolaou smear 12/29/2009  ? Hypothyroidism 09/07/2008  ? GOITER, MULTINODULAR 01/12/2008  ? ALLERGIC RHINITIS 01/12/2008  ? CHICKENPOX, HX OF 01/12/2008  ? ? ?Orientation RESPIRATION BLADDER Height & Weight   ?  ?Self, Time, Situation, Place ? Normal Continent, External catheter Weight: 161 lb 13.1 oz (73.4 kg) ?Height:  5' 4.5" (163.8 cm)  ?BEHAVIORAL SYMPTOMS/MOOD NEUROLOGICAL BOWEL NUTRITION STATUS  ?    Continent Diet  ?AMBULATORY STATUS COMMUNICATION OF NEEDS Skin   ?Limited Assist Verbally Normal ?  ?  ?  ?    ?     ?      ? ? ?Personal Care Assistance Level of Assistance  ?Bathing, Feeding, Dressing Bathing Assistance: Limited assistance ?Feeding assistance: Independent ?Dressing Assistance: Limited assistance ?   ? ?Functional Limitations Info  ?Sight, Hearing, Speech Sight Info: Adequate ?Hearing Info: Adequate ?Speech Info: Adequate  ? ? ?SPECIAL CARE FACTORS FREQUENCY  ?PT (By licensed PT), OT (By licensed OT)   ?  ?PT Frequency: 5x a week ?OT Frequency: 5xva week ?  ?  ?  ?   ? ? ?Contractures Contractures Info: Not present  ? ? ?Additional Factors Info  ?Code Status, Allergies Code Status Info: full ?Allergies Info: nka ?  ?  ?  ?   ? ?Current Medications (09/18/2021):  This is the current hospital active medication list ?Current Facility-Administered Medications  ?Medication Dose Route Frequency Provider Last Rate Last Admin  ? albuterol (PROVENTIL) (2.5 MG/3ML) 0.083% nebulizer solution 2.5 mg  2.5 mg Nebulization Q6H PRN Tamala Julian, Rondell A, MD      ? docusate sodium (COLACE) capsule 100 mg  100 mg Oral BID Stechschulte, Nickola Major, MD   100 mg at 09/17/21 2050  ? enoxaparin (LOVENOX) injection 40 mg  40 mg Subcutaneous Q24H Fuller Plan A, MD   40 mg at 09/18/21 0753  ? ferrous sulfate tablet 325 mg  325 mg Oral Q breakfast Shelly Coss, MD   325 mg at 09/18/21 0753  ? furosemide (LASIX) tablet 40 mg  40 mg Oral Daily Shelly Coss, MD   40 mg at 09/17/21 1037  ? levothyroxine (SYNTHROID) tablet  50 mcg  50 mcg Oral Daily Shelly Coss, MD   50 mcg at 09/18/21 0538  ? methocarbamol (ROBAXIN) tablet 500 mg  500 mg Oral Q6H PRN Saverio Danker, PA-C      ? montelukast (SINGULAIR) tablet 10 mg  10 mg Oral QHS Shelly Coss, MD   10 mg at 09/17/21 2051  ? ondansetron (ZOFRAN) tablet 4 mg  4 mg Oral Q6H PRN Fuller Plan A, MD      ? Or  ? ondansetron (ZOFRAN) injection 4 mg  4 mg Intravenous Q6H PRN Fuller Plan A, MD   4 mg at 09/15/21 1120  ? oxyCODONE (Oxy IR/ROXICODONE) immediate release tablet 10 mg  10 mg Oral Q4H PRN  Stechschulte, Nickola Major, MD   10 mg at 09/16/21 2351  ? oxyCODONE (Oxy IR/ROXICODONE) immediate release tablet 5 mg  5 mg Oral Q4H PRN Stechschulte, Nickola Major, MD   5 mg at 09/17/21 2311  ? pantoprazole (PROTONIX) EC tablet 40 mg  40 mg Oral BID Shelly Coss, MD   40 mg at 09/17/21 2050  ? polyethylene glycol (MIRALAX / GLYCOLAX) packet 17 g  17 g Oral Daily Saverio Danker, PA-C   17 g at 09/17/21 1038  ? prochlorperazine (COMPAZINE) injection 10 mg  10 mg Intravenous Q4H PRN Stechschulte, Nickola Major, MD      ? simethicone (MYLICON) chewable tablet 80 mg  80 mg Oral QID PRN Stechschulte, Nickola Major, MD   80 mg at 09/17/21 2311  ? sodium chloride flush (NS) 0.9 % injection 3 mL  3 mL Intravenous Q12H Smith, Rondell A, MD   3 mL at 09/17/21 1038  ? spironolactone (ALDACTONE) tablet 100 mg  100 mg Oral Daily Wendee Beavers T, MD   100 mg at 09/17/21 1037  ? ? ? ?Discharge Medications: ?Please see discharge summary for a list of discharge medications. ? ?Relevant Imaging Results: ? ?Relevant Lab Results: ? ? ?Additional Information ?ssn [phone] ? ?Emeterio Reeve, LCSW ? ? ? ? ?

## 2021-09-18 NOTE — Plan of Care (Signed)

## 2021-09-18 NOTE — Progress Notes (Signed)
OT Cancellation Note ? ?Patient Details ?Name: Crystal Barnett ?MRN: [id number] ?DOB: January 31, 1941 ? ? ?Cancelled Treatment:    Reason Eval/Treat Not Completed: Other (comment) pt reports waiting on enema declining functional mobility or ADL participation at this time, will check back as time allows for OT session.  ? ?Corinne Ports K., COTA/L ?Acute Rehabilitation Services ?[phone] ? ? ?[name] ?09/18/2021, 1:12 PM ?

## 2021-09-19 DIAGNOSIS — K420 Umbilical hernia with obstruction, without gangrene: Secondary | ICD-10-CM | POA: Diagnosis not present

## 2021-09-19 NOTE — Progress Notes (Signed)
Mobility Specialist Progress Note: ? ? 09/19/21 1523  ?Mobility  ?Activity Ambulated with assistance in room;Ambulated with assistance to bathroom  ?Level of Assistance Standby assist, set-up cues, supervision of patient - no hands on  ?Assistive Device Front wheel walker  ?Distance Ambulated (ft) 120 ft  ?Activity Response Tolerated well  ?$Mobility charge 1 Mobility  ? ?Pt received in bathroom needing to get back to bed. Pt able to have BM. Ambulated laps in room. Left in bed with call bell in reach and all needs met.  ? ?Sherral Dirocco ?Mobility Specialist ?Primary Phone [phone] ? ?

## 2021-09-19 NOTE — Progress Notes (Signed)
Physical Therapy Treatment ?Patient Details ?Name: Crystal Barnett ?MRN: [id number] ?DOB: 03/09/1941 ?Today's Date: 09/19/2021 ? ? ?History of Present Illness Pt is an 81 y.o. female admitted 09/11/21 with abdominal pain, nausea/vomiting, constipation. Workup for incarcerated umbilical hernia s/p hernia repair and small bowel resection 5/3. PMH includes DM, hepatic cirrhosis, osteoporosis, cataract, liver CA. ?  ?PT Comments  ? ? Pt progressing with mobility. Today's session focused on mobility and ADLs to simulate home environment, pt able to mobilize with intermittent min guard for balance, requiring assist for LB dressing ("I haven't been able to put my socks on in over a year"). Pt with clear balance deficits, but stability improved with RW and UE support at sink for certain tasks. Pt remains limited by generalized weakness, decreased activity tolerance, and impaired balance strategies/postural reactions. Increased time discussing benefits of HHPT vs. short-term SNF; pt reports unsure, and still wanting to consider SNF for rehab. Will continue to follow acutely to address established goals.  ?   ?Recommendations for follow up therapy are one component of a multi-disciplinary discharge planning process, led by the attending physician.  Recommendations may be updated based on patient status, additional functional criteria and insurance authorization. ? ?Follow Up Recommendations ? Skilled nursing-short term rehab (<3 hours/day) (per pt request) ?  ?  ?Assistance Recommended at Discharge Intermittent Supervision/Assistance  ?Patient can return home with the following A little help with bathing/dressing/bathroom;Assistance with cooking/housework;Assist for transportation ?  ?Equipment Recommendations ? Rolling walker (2 wheels)  ?  ?Recommendations for Other Services   ? ? ?  ?Precautions / Restrictions Precautions ?Precautions: Fall;Other (comment) ?Precaution Comments: abdominal precautions for comfort (has abdominal  binder); urine incontinence ?Restrictions ?Weight Bearing Restrictions: No  ?  ? ?Mobility ? Bed Mobility ?Overal bed mobility: Modified Independent ?Bed Mobility: Supine to Sit ?  ?  ?Supine to sit: Modified independent (Device/Increase time) ?  ?  ?General bed mobility comments: HOB elevated ?  ? ?Transfers ?Overall transfer level: Needs assistance ?Equipment used: Rolling walker (2 wheels) ?  ?  ?  ?  ?  ?  ?  ?General transfer comment: encouraged pt to problem solve through which DME she should use (SPC v. RW v. rollator) - pt deciding RW for increased stability first time up this morning; pt standing from EOB without DME since RW at end of bed, asked pt if this was safest technique, pt deciding to sit down and scoot towards end of bed to grab RW first; cues for sequencing to stand from low toilet height, pt able to do so with supervision ?  ? ?Ambulation/Gait ?Ambulation/Gait assistance: Min guard, Supervision ?Gait Distance (Feet): 24 Feet ?Assistive device: Rolling walker (2 wheels) ?Gait Pattern/deviations: Step-through pattern, Decreased stride length, Trunk flexed ?Gait velocity: Decreased ?  ?  ?General Gait Details: slow, mostly steady gait with RW and initial min guard for balance, progressing to supervision; prioritized polonged standing and seated ADL tasks, therefore further distance limited by fatigue ? ? ?Stairs ?  ?  ?  ?  ?  ? ? ?Wheelchair Mobility ?  ? ?Modified Rankin (Stroke Patients Only) ?  ? ? ?  ?Balance Overall balance assessment: Needs assistance ?Sitting-balance support: Feet supported, No upper extremity supported ?Sitting balance-Leahy Scale: Good ?Sitting balance - Comments: assist to don socks (pt reports, "I haven't been able to put on socks in over a year" - reports wearing slip on shoes at home; unable to perform figure four technique) ?  ?Standing balance support: No  upper extremity supported, Single extremity supported, During functional activity ?Standing balance-Leahy Scale:  Fair ?Standing balance comment: prolonged static standing without UE support to perform pericare, don underwear/pad, stand at sink to wash hands, brush teeth, wash face; 1x seated rest break after ADL tasks at sink due to fatigue ?  ?  ?  ?  ?  ?  ?  ?  ?  ?  ?  ?  ? ?  ?Cognition Arousal/Alertness: Awake/alert ?Behavior During Therapy: Flat affect ?Overall Cognitive Status: No family/caregiver present to determine baseline cognitive functioning ?  ?  ?  ?  ?  ?  ?  ?  ?  ?  ?  ?  ?  ?  ?  ?  ?General Comments: suspect baseline difficulty multi-tasking, pt with improving awareness and processing once up and moving. increased time problem solving through tasks (i.e. sitting EOB - what does pt need to do before hallway ambulation... pt knows she will be incontinent upon standing, so she should go to bathroom first - in order to simulate how pt would be performing tasks in home environment) - pt requiring intermittent cues/suggestions for this ?  ?  ? ?  ?Exercises   ? ?  ?General Comments General comments (skin integrity, edema, etc.): increased time discussing safe d/c plan, as not pt considering SNF for short-term rehab due to increased difficulty caring for self. explained details regarding SNF vs. home with HHPT (and fact that some of pt's concerns with home (i.e. new puppy) will not be resolved by her stay at SNF) - pt still unsure, but going to "think about it" ?  ?  ? ?Pertinent Vitals/Pain Pain Assessment ?Pain Assessment: Faces ?Faces Pain Scale: Hurts a little bit ?Pain Location: abdomen ?Pain Descriptors / Indicators: Sore ?Pain Intervention(s): Monitored during session  ? ? ?Home Living   ?  ?  ?  ?  ?  ?  ?  ?  ?  ?   ?  ?Prior Function    ?  ?  ?   ? ?PT Goals (current goals can now be found in the care plan section) Progress towards PT goals: Progressing toward goals ? ?  ?Frequency ? ? ? Min 4X/week ? ? ? ?  ?PT Plan Current plan remains appropriate  ? ? ?Co-evaluation   ?  ?  ?  ?  ? ?  ?AM-PAC PT "6  Clicks" Mobility   ?Outcome Measure ? Help needed turning from your back to your side while in a flat bed without using bedrails?: None ?Help needed moving from lying on your back to sitting on the side of a flat bed without using bedrails?: A Little ?Help needed moving to and from a bed to a chair (including a wheelchair)?: A Little ?Help needed standing up from a chair using your arms (e.g., wheelchair or bedside chair)?: A Little ?Help needed to walk in hospital room?: A Little ?Help needed climbing 3-5 steps with a railing? : A Little ?6 Click Score: 19 ? ?  ?End of Session Equipment Utilized During Treatment: Gait belt ?Activity Tolerance: Patient tolerated treatment well;Patient limited by fatigue ?Patient left: in chair;with call bell/phone within reach;with chair alarm set ?Nurse Communication: Mobility status ?PT Visit Diagnosis: Other abnormalities of gait and mobility (R26.89);Muscle weakness (generalized) (M62.81);Pain ?  ? ? ?Time: 9675-9163 ?PT Time Calculation (min) (ACUTE ONLY): 37 min ? ?Charges:  $Therapeutic Activity: 23-37 mins          ?          ? ?  Mabeline Caras, PT, DPT ?Acute Rehabilitation Services  ?Pager [phone] ?Office [phone] ? ?Derry Lory ?09/19/2021, 9:26 AM ? ?

## 2021-09-19 NOTE — TOC Progression Note (Signed)
Transition of Care (TOC) - Progression Note  ? ? ?Patient Details  ?Name: Crystal Barnett ?MRN: [id number] ?Date of Birth: [birth year] ? ?Transition of Care (TOC) CM/SW Contact  ?Emeterio Reeve, LCSW ?Phone Number: ?09/19/2021, 4:11 PM ? ?Clinical Narrative:    ? ?CSW spoke to pt at bedside. Pt reports she is leaning towards going home. Pt states she will confirm with her son this afternoon when he visits. CSW informed pt that a decision needs to be made by tomorrow morning.  ? ?  ?  ? ?Expected Discharge Plan and Services ?  ?  ?  ?  ?  ?                ?  ?  ?  ?  ?  ?  ?  ?  ?  ?  ? ? ?Social Determinants of Health (SDOH) Interventions ?  ? ?Readmission Risk Interventions ?   ? View : No data to display.  ?  ?  ?  ? ?Emeterio Reeve, LCSW ?Clinical Social Worker ? ?

## 2021-09-19 NOTE — Progress Notes (Addendum)
. ?PROGRESS NOTE ? ? ? ?Crystal Barnett  XTK:[id number] DOB: 05/26/1940 DOA: 09/11/2021 ?PCP: Vernie Shanks, MD  ? ? ?Brief Narrative:  ? ?Crystal Barnett is a 81 y.o. female with past medical history significant for HTN, NASH cirrhosis, thrombocytopenia, type 2 diabetes mellitus, hypothyroidism, GERD, history of umbilical hernia who presented to [facility] ED on 5/2 with acute onset abdominal pain associated with nausea/vomiting.  CT abdomen/pelvis on admission notable for large umbilical hernia containing ascites, dilated small bowel with a transition point consistent with small bowel obstruction with concern for strangulated/incarcerated hernia.  Patient was started on IV fluids and antibiotics.  General surgery was consulted.  Hospital service consulted for further evaluation management of SBO with concern for incarcerated hernia. ? ? ? ?Assessment & Plan: ?  ?Small bowel obstruction with incarcerated umbilical hernia ?Patient presenting with acute onset abdominal pain associate with nausea/vomiting. CT abdomen/pelvis on admission notable for large umbilical hernia containing ascites, dilated small bowel with a transition point consistent with small bowel obstruction with concern for strangulated/incarcerated hernia.  General surgery was consulted and underwent umbilical hernia repair with small bowel resection on 5/3 by Dr. Thermon Leyland.  Diet was slowly advanced with toleration.  Outpatient follow-up with general surgery on 10/03/2021 at 10:20 AM. ?--Oxycodone 5-10 mg p.o. every 4 hours as needed moderate/severe pain ?--Robaxin 500 mg p.o. every 6 hours as needed muscle spasm ? ?NASH cirrhosis with ascites ?Patient undergoes regular paracentesis outpatient.  Needs regular paracentesis to promote wound healing/prevent abdominal distention.  Paracentesis attempted on 5/8 but aborted due to no significant fluid.  Follows with [facility] and medical oncology.  Concern with liver lesion likely dysplastic nodule versus early  [place].  ?--Furosemide 40 mg p.o. daily ?--Spironolactone 100 mg p.o. daily ?--Outpatient follow-up with GI, Dr. Henrene Pastor and Oncology Dr. Irene Limbo ? ?Thrombocytopenia, chronic ?Etiology likely secondary to [facility] cirrhosis, stable. ? ?Hyponatremia ?Secondary to hypervolemic hyponatremia from [place] cirrhosis. ?--Continue diuretics with furosemide and spironolactone as above ? ?Iron deficiency anemia ?Received IV iron while inpatient.  Hemoglobin stable. ?--Ferrous sulfate 325 mg p.o. daily ? ?Type 2 diabetes mellitus ?Home regimen includes metformin 5 mg p.o. daily. ?--Holding oral hypoglycemics while inpatient ?--Hemoglobin stable on consistent carbohydrate diet ? ?Hypothyroidism ?--Levothyroxine 50 mcg p.o. daily ? ?GERD: Protonix 40 mg p.o. twice daily ? ?Debility/physical deconditioning: ?--Continue PT/OT efforts while inpatient ?--Pending SNF placement per TOC ? ? ?DVT prophylaxis: enoxaparin (LOVENOX) injection 40 mg Start: 09/13/21 0800 ? ?  Code Status: Full Code ?Family Communication: No family present at bedside ? ?Disposition Plan:  ?Level of care: Telemetry Surgical ?Status is: Inpatient ?Remains inpatient appropriate because: Medically stable for discharge, pending SNF placement ?  ? ?Consultants:  ?General surgery ? ?Procedures:  ?Umbilical hernia repair ? ?Antimicrobials:  ?Perioperative cefazolin ? ? ?Subjective: ?Patient seen examined bedside, resting comfortably.  Sitting in bedside chair.  No complaints this morning.  Medically stable for discharge once SNF bed available.  No other questions or concerns at this time.  Denies headache, no dizziness, no chest pain, no palpitations, no shortness of breath, no abdominal pain, no weakness, no fatigue, no paresthesias.  No acute events overnight per nurse staff. ? ?Objective: ?Vitals:  ? 09/18/21 2037 09/19/21 0021 09/19/21 9924 09/19/21 0807  ?BP: (!) 122/58 (!) 119/56 (!) 113/52 116/61  ?Pulse: 87 85 81 86  ?Resp: 18 16 18 16   ?Temp: 98.4 ?F (36.9 ?C) 98.3 ?F  (36.8 ?C) 98.6 ?F (37 ?C) 98.9 ?F (37.2 ?C)  ?TempSrc:  Oral Oral Oral Oral  ?SpO2: 97% 96% 98% 98%  ?Weight:      ?Height:      ? ? ?Intake/Output Summary (Last 24 hours) at 09/19/2021 1441 ?Last data filed at 09/19/2021 [phone] ?Gross per 24 hour  ?Intake --  ?Output 400 ml  ?Net -400 ml  ? ?Filed Weights  ? 09/14/21 0437 09/15/21 0427 09/18/21 0500  ?Weight: 71.4 kg 72.5 kg 73.4 kg  ? ? ?Examination: ? ?Physical Exam: ?GEN: NAD, alert and oriented x 3, wd/wn ?HEENT: NCAT, PERRL, EOMI, sclera clear, MMM ?PULM: CTAB w/o wheezes/crackles, normal respiratory effort, on room air ?CV: RRR w/o M/G/R ?GI: abd soft, NTND, NABS, no R/G/M abdominal incision site noted ?MSK: no peripheral edema, muscle strength globally intact 5/5 bilateral upper/lower extremities ?NEURO: CN II-XII intact, no focal deficits, sensation to light touch intact ?PSYCH: normal mood/affect ?Integumentary: Normal incision site noted, dry/intact, no rashes or wounds ? ? ? ?Data Reviewed: I have personally reviewed following labs and imaging studies ? ?CBC: ?Recent Labs  ?Lab 09/13/21 ?1660 09/14/21 ?0117 09/15/21 ?0038 09/16/21 ?6301 09/17/21 ?0132  ?WBC 6.9 7.7 7.0 6.1 5.6  ?HGB 12.6 11.2* 11.0* 10.5* 10.2*  ?HCT 37.6 33.4* 31.8* 29.7* 28.8*  ?MCV 91.7 93.3 91.4 90.0 89.2  ?PLT 115* 105* 107* 102* 139*  ? ?Basic Metabolic Panel: ?Recent Labs  ?Lab 09/13/21 ?6010 09/14/21 ?0117 09/15/21 ?9323 09/15/21 ?5573 09/16/21 ?2202 09/17/21 ?0132  ?NA 134* 135  --  131* 129* 130*  ?K 4.8 5.4*  --  4.5 5.0 4.5  ?CL 105 104  --  97* 98 97*  ?CO2 22 21*  --  27 23 26   ?GLUCOSE 117* 83  --  103* 115* 88  ?BUN 23 28*  --  29* 26* 21  ?CREATININE 0.99 0.88  --  0.95 0.92 0.74  ?CALCIUM 8.9 8.9  --  8.7* 8.5* 8.3*  ?MG  --   --  1.8  --  1.7 1.8  ?PHOS  --   --  2.8 2.8 3.1 3.1  ? ?GFR: ?Estimated Creatinine Clearance: 54.8 mL/min (by C-G formula based on SCr of 0.74 mg/dL). ?Liver Function Tests: ?Recent Labs  ?Lab 09/13/21 ?5427 09/14/21 ?0117 09/15/21 ?0623  09/15/21 ?7628 09/16/21 ?3151 09/17/21 ?0132  ?AST 34 29 26  --  28 26  ?ALT 21 13 17   --  18 17  ?ALKPHOS 69 59 67  --  79 81  ?BILITOT 1.1 1.6* 1.0  --  0.9 0.9  ?PROT 5.9* 5.7* 5.9*  --  5.5* 5.3*  ?ALBUMIN 3.2* 3.0* 2.7* 2.8* 2.6*  2.5* 2.4*  2.4*  ? ?No results for input(s): LIPASE, AMYLASE in the last 168 hours. ?No results for input(s): AMMONIA in the last 168 hours. ?Coagulation Profile: ?No results for input(s): INR, PROTIME in the last 168 hours. ?Cardiac Enzymes: ?No results for input(s): CKTOTAL, CKMB, CKMBINDEX, TROPONINI in the last 168 hours. ?BNP (last 3 results) ?No results for input(s): PROBNP in the last 8760 hours. ?HbA1C: ?No results for input(s): HGBA1C in the last 72 hours. ?CBG: ?No results for input(s): GLUCAP in the last 168 hours. ?Lipid Profile: ?No results for input(s): CHOL, HDL, LDLCALC, TRIG, CHOLHDL, LDLDIRECT in the last 72 hours. ?Thyroid Function Tests: ?No results for input(s): TSH, T4TOTAL, FREET4, T3FREE, THYROIDAB in the last 72 hours. ?Anemia Panel: ?No results for input(s): VITAMINB12, FOLATE, FERRITIN, TIBC, IRON, RETICCTPCT in the last 72 hours. ?Sepsis Labs: ?No results for input(s): PROCALCITON, LATICACIDVEN in the last 168 hours. ? ?  Recent Results (from the past 240 hour(s))  ?Culture, body fluid w Gram Stain-bottle     Status: None  ? Collection Time: 09/10/21 11:56 AM  ? Specimen: Peritoneal Washings  ?Result Value Ref Range Status  ? Specimen Description PERITONEAL  Final  ? Special Requests NONE  Final  ? Culture   Final  ?  NO GROWTH 5 DAYS ?Performed at [facility], [place] [address] [place], [place] [address] ?  ? Report Status 09/15/2021 FINAL  Final  ?Gram stain     Status: None  ? Collection Time: 09/10/21 11:56 AM  ? Specimen: Peritoneal Washings  ?Result Value Ref Range Status  ? Specimen Description PERITONEAL  Final  ? Special Requests NONE  Final  ? Gram Stain   Final  ?  NO WBC SEEN ?NO ORGANISMS SEEN ?Performed at [facility], [place]  [address], [place],  [address] ?  ? Report Status 09/10/2021 FINAL  Final  ?  ? ? ? ? ? ?Radiology Studies: ?IR ABDOMEN US LIMITED ? ?Result Date: 09/17/2021 ?CLINICAL DATA:  Abdominal distension, history of ascites, asses

## 2021-09-19 NOTE — Progress Notes (Signed)
Occupational Therapy Treatment ?Patient Details ?Name: Crystal Barnett ?MRN: [id number] ?DOB: [birth year] ?Today's Date: 09/19/2021 ? ? ?History of present illness Pt is an 81 y.o. female admitted 09/11/21 with abdominal pain, nausea/vomiting, constipation. Workup for incarcerated umbilical hernia s/p hernia repair and small bowel resection 5/3. PMH includes DM, hepatic cirrhosis, osteoporosis, cataract, liver CA. ?  ?OT comments ? Pt was seen for OT ADL retraining session today with focus on bed moblity with Min A, functional mobility in room and bathroom w/ Min guard assist, toileting (sit<>stand x2 Min guard assist using grab bar), hygeine/peri care with Min A, and standing at sink for grooming with close supervision/min guard. Pt is progressing slowly toward [facility] and goals for OT. Discussed SNF Rehab however pt states "I'm still thinking about it".  Pt education on role of OT and pt goals  ? ?Recommendations for follow up therapy are one component of a multi-disciplinary discharge planning process, led by the attending physician.  Recommendations may be updated based on patient status, additional functional criteria and insurance authorization. ?   ?Follow Up Recommendations ? Skilled nursing-short term rehab (<3 hours/day) (pending progress, pt declining HHOT, open to SNF, wants limited help from son at home)  ?  ?Assistance Recommended at Discharge Intermittent Supervision/Assistance  ?Patient can return home with the following ? A lot of help with bathing/dressing/bathroom;A little help with walking and/or transfers;Assistance with cooking/housework;Assist for transportation;Help with stairs or ramp for entrance ?  ?Equipment Recommendations ? None recommended by OT;Other (comment) (Defer to next venue)  ?  ?Recommendations for Other Services   ? ?  ?Precautions / Restrictions Precautions ?Precautions: Fall;Other (comment) ?Precaution Comments: abdominal precautions for comfort (has abdominal binder); urine  incontinence ?Restrictions ?Weight Bearing Restrictions: No  ? ? ?  ? ?Mobility Bed Mobility ?Overal bed mobility: Needs Assistance ?Bed Mobility: Sit to Supine ?  ?Sit to supine: Min guard, HOB elevated ?  ?General bed mobility comments: Min guard assist and increased time to reposition in bed. ?  ? ?Transfers ?Overall transfer level: Needs assistance ?Equipment used: Rolling walker (2 wheels) ?Transfers: Sit to/from Stand ?Sit to Stand: Min guard ?  ?General transfer comment: Pt perforemd functional mobilty in room and bathroom using RW and Min guard assist overall. ?  ?  ?Balance Overall balance assessment: Needs assistance ?Sitting-balance support: Feet supported, No upper extremity supported ?Sitting balance-Leahy Scale: Good ?Sitting balance - Comments: assist to don/doff socks (pt reports, "I haven't been able to put on socks in over a year" - reports wearing slip on shoes at home; unable to perform figure four technique) ?  ?Standing balance support: No upper extremity supported, Single extremity supported, During functional activity ?Standing balance-Leahy Scale: Fair ?Standing balance comment: prolonged static standing without UE support to perform pericare, don underwear/pad, stand at sink to wash hands and brush/comb hair ?   ? ?ADL either performed or assessed with clinical judgement  ? ?ADL Overall ADL's : Needs assistance/impaired ?  ?  ?Grooming: Wash/dry hands;Brushing hair;Standing;Min guard ?Grooming Details (indicate cue type and reason): completed standing at sink ?  ?  ?Lower Body Dressing: Sit to/from stand;Sitting/lateral leans;Minimal assistance;Moderate assistance ?  ?Toilet Transfer: Minimal Teacher, English as a foreign language;Ambulation;Rolling walker (2 wheels);Grab bars ?Toilet Transfer Details (indicate cue type and reason): Pt ambulated into bathroom and on toilet using grab bar. ?Toileting- Clothing Manipulation and Hygiene: Minimal assistance;Sitting/lateral lean;Sit to/from stand ?Toileting  - Clothing Manipulation Details (indicate cue type and reason): for clothing mgmt ?  ?Functional mobility during ADLs: Min guard;Rolling  walker (2 wheels) ?General ADL Comments: Pt was seen for OT ADL retraining session today with focus on bed moblity with Min A, functional mobility in room and bathroom w/ Min guard assist, toileting (sit<>stand x2 Min guard assist using grab bar), hygeine/peri care with Min A, and standing at sink for grooming with close supervision/min guard. Pt is progressing slowly toward [facility] and goals for OT. Discussed SNF Rehab however pt states "I'm still thinking about it".  Pt education on role of OT and pt goals. ?  ? ?Extremity/Trunk Assessment Upper Extremity Assessment ?Upper Extremity Assessment: Overall WFL for tasks assessed ?  ?Lower Extremity Assessment ?Lower Extremity Assessment: Defer to PT evaluation ?  ?Cervical / Trunk Assessment ?Cervical / Trunk Assessment: Normal ?  ? ?Vision Baseline Vision/History: 1 Wears glasses ?Patient Visual Report: No change from baseline ?Vision Assessment?: No apparent visual deficits ?  ?   ?   ? ?Cognition Arousal/Alertness: Awake/alert ?Behavior During Therapy: Flat affect, WFL for tasks assessed/performed ?Overall Cognitive Status: No family/caregiver present to determine baseline cognitive functioning ?  ?  ?General Comments: suspect baseline difficulty multi-tasking, pt with improving awareness and processing once up and moving. increased time problem solving through tasks (i.e. sitting EOB - what does pt need to do before hallway ambulation... pt knows she will be incontinent upon standing, so she should go to bathroom first - in order to simulate how pt would be performing tasks in home environment) - pt requiring intermittent cues/suggestions for this ?  ?  ?   ?   ?   ?General Comments increased time discussing safe d/c plan, as not pt considering SNF for short-term rehab due to increased difficulty caring for self. pt states that she  is still unsure, but  "thinking about it"  ? ? ?Pertinent Vitals/ Pain       Pain Assessment ?Pain Assessment: Faces ?Faces Pain Scale: Hurts a little bit ?Pain Location: abdomen ?Pain Descriptors / Indicators: Sore ?Pain Intervention(s): Monitored during session ? ?Home Living  Refer to initial OT acute assessment for home set-up and PLOF ?  ?Prior Functioning/Environment   Please refer to initial OT acute assessment for PLOF  ? ?Frequency ? Min 3X/week  ? ? ? ? ?  ?Progress Toward Goals ? ?OT Goals(current goals can now be found in the care plan section) ? Progress towards OT goals: Progressing toward goals ? ?Acute Rehab OT Goals ?Patient Stated Goal: None stated ?OT Goal Formulation: With patient ?Time For Goal Achievement: 09/29/21 ?Potential to Achieve Goals: Good  ?Plan Discharge plan remains appropriate   ? ?   ?AM-PAC OT "6 Clicks" Daily Activity     ?Outcome Measure ? ? Help from another person eating meals?: None ?Help from another person taking care of personal grooming?: A Little ?Help from another person toileting, which includes using toliet, bedpan, or urinal?: A Little ?Help from another person bathing (including washing, rinsing, drying)?: A Lot ?Help from another person to put on and taking off regular upper body clothing?: A Little ?Help from another person to put on and taking off regular lower body clothing?: A Lot ?6 Click Score: 17 ? ?  ?End of Session Equipment Utilized During Treatment: Rolling walker (2 wheels) ? ?OT Visit Diagnosis: Unsteadiness on feet (R26.81);Other abnormalities of gait and mobility (R26.89);Muscle weakness (generalized) (M62.81) ?  ?Activity Tolerance Patient tolerated treatment well ?  ?Patient Left in bed;with call bell/phone within reach;with bed alarm set ?  ?Nurse Communication Mobility status ?  ? ?   ? ?  Time: 6767-2094 ?OT Time Calculation (min): 21 min ? ?Charges: OT General Charges ?$OT Visit: 1 Visit ?OT Treatments ?$Self Care/Home Management : 8-22  mins ? ? ?Percell Miller Beth Dixon, OTR/L ?09/19/2021, 12:30 PM ? ? ?

## 2021-09-20 DIAGNOSIS — K420 Umbilical hernia with obstruction, without gangrene: Secondary | ICD-10-CM | POA: Diagnosis not present

## 2021-09-20 MED ORDER — OXYCODONE HCL 5 MG PO TABS: 5.0000 mg | ORAL_TABLET | ORAL | 0 refills | Status: DC | PRN

## 2021-09-20 MED ORDER — FERROUS SULFATE 325 (65 FE) MG PO TABS: 325.0000 mg | ORAL_TABLET | Freq: Every day | ORAL | 3 refills | Status: AC

## 2021-09-20 MED ORDER — POLYETHYLENE GLYCOL 3350 17 G PO PACK: 17.0000 g | PACK | Freq: Two times a day (BID) | ORAL | 0 refills | Status: AC

## 2021-09-20 MED ORDER — METHOCARBAMOL 500 MG PO TABS: 500.0000 mg | ORAL_TABLET | Freq: Four times a day (QID) | ORAL | Status: DC | PRN

## 2021-09-20 MED ORDER — DOCUSATE SODIUM 100 MG PO CAPS
100.0000 mg | ORAL_CAPSULE | Freq: Two times a day (BID) | ORAL | 0 refills | Status: AC
Start: 2021-09-20 — End: ?

## 2021-09-20 NOTE — TOC Transition Note (Signed)
Transition of Care (TOC) - CM/SW Discharge Note ? ? ?Patient Details  ?Name: Crystal Barnett ?MRN: [id number] ?Date of Birth: 30-Sep-1940 ? ?Transition of Care (TOC) CM/SW Contact:  ?Emeterio Reeve, LCSW ?Phone Number: ?09/20/2021, 10:59 AM ? ? ?Clinical Narrative:    ? ?Per MD patient ready for DC to [facility]. RN, patient, patient's family, and facility notified of DC. Discharge Summary and FL2 sent to facility. DC packet on chart.   Ambulance transport requested for patient.  ?  ?RN to call report to [phone]. ? ?CSW will sign off for now as social work intervention is no longer needed. Please consult [place] again if new needs arise. ? ? ? ?Final next level of care: [place] ?Barriers to Discharge: Barriers Resolved ? ? ?Patient Goals and CMS Choice ?  ?  ?  ? ?Discharge Placement ?  ?           ?Patient chooses bed at: [facility] ?Patient to be transferred to facility by: [facility] ?Name of family member notified: pt will notify son ?Patient and family notified of of transfer: 09/20/21 ? ?Discharge Plan and Services ?  ?  ?           ?  ?  ?  ?  ?  ?  ?  ?  ?  ?  ? ?Social Determinants of Health (SDOH) Interventions ?  ? ? ?Readmission Risk Interventions ?   ? View : No data to display.  ?  ?  ?  ? ? ? ?Emeterio Reeve, LCSW ?Clinical Social Worker ? ? ?

## 2021-09-20 NOTE — Progress Notes (Signed)
Mobility Specialist Progress Note: ? ? 09/20/21 1549  ?Mobility  ?Activity Ambulated with assistance in room;Ambulated with assistance to bathroom  ?Level of Assistance Standby assist, set-up cues, supervision of patient - no hands on  ?Assistive Device Front wheel walker  ?Distance Ambulated (ft) 200 ft  ?Activity Response Tolerated well  ?$Mobility charge 1 Mobility  ? ?Pt received in bed asking to use restroom. No complaints of pain. Pt then completed laps in room with RW. Left in bed with call bell in reach and all needs met.  ? ?Crystal Barnett ?Mobility Specialist ?Primary Phone [phone] ? ?

## 2021-09-20 NOTE — Progress Notes (Signed)
Report called to [facility].  ?

## 2021-09-20 NOTE — Care Management Important Message (Signed)
Important Message ? ?Patient Details  ?Name: Crystal Barnett ?MRN: [id number] ?Date of Birth: [birth year] ? ? ?Medicare Important Message Given:  Yes ? ? ? ? ?[name] ?09/20/2021, 3:42 PM ?

## 2021-09-20 NOTE — Discharge Summary (Signed)
?Physician Discharge Summary  ?Crystal Barnett QMG:[id number] DOB: February 22, 1941 DOA: 09/11/2021 ? ?PCP: Vernie Shanks, MD ? ?Admit date: 09/11/2021 ?Discharge date: 09/20/2021 ? ?Admitted From: Home ?Disposition: SNF ? ?Recommendations for Outpatient Follow-up:  ?Follow up with PCP in 1-2 weeks ?Follow-up with general surgery, Dr. Thermon Leyland as scheduled on 10/03/2021 at 10:20 AM ?Will also need close follow-up with gastroenterology, Dr. Henrene Pastor and oncology Dr. Irene Limbo regarding Crystal Barnett cirrhosis and liver lesion concerning for dysplastic nodule versus early [facility]. ? ? ?Discharge Condition: Stable ?CODE STATUS: Full code ?Diet recommendation: Heart healthy/low-salt/consistent carb diet ? ?History of present illness: ? ?Crystal Barnett is a 81 y.o. female with past medical history significant for HTN, NASH cirrhosis, thrombocytopenia, type 2 diabetes mellitus, hypothyroidism, GERD, history of umbilical hernia who presented to [facility] ED on 5/2 with acute onset abdominal pain associated with nausea/vomiting.  CT abdomen/pelvis on admission notable for large umbilical hernia containing ascites, dilated small bowel with a transition point consistent with small bowel obstruction with concern for strangulated/incarcerated hernia.  Patient was started on IV fluids and antibiotics.  General surgery was consulted.  Hospital service consulted for further evaluation management of SBO with concern for incarcerated hernia. ? ?Hospital course: ? ? ?Small bowel obstruction with incarcerated umbilical hernia ?Patient presenting with acute onset abdominal pain associate with nausea/vomiting. CT abdomen/pelvis on admission notable for large umbilical hernia containing ascites, dilated small bowel with a transition point consistent with small bowel obstruction with concern for strangulated/incarcerated hernia.  General surgery was consulted and underwent umbilical hernia repair with small bowel resection on 5/3 by Dr. Thermon Leyland.  Diet was slowly  advanced with toleration.  Outpatient follow-up with general surgery on 10/03/2021 at 10:20 AM.   ?  ?NASH cirrhosis with ascites ?Patient undergoes regular paracentesis outpatient.  Needs regular paracentesis to promote wound healing/prevent abdominal distention.  Paracentesis attempted on 5/8 but aborted due to no significant fluid.  Follows with [facility] and medical oncology.  Concern with liver lesion likely dysplastic nodule versus early [place].  Continue home furosemide 40 mg p.o. daily, spironolactone 100 mg p.o. daily. Outpatient follow-up with GI, Dr. Henrene Pastor and Oncology Dr. Irene Limbo ?  ?Thrombocytopenia, chronic ?Etiology likely secondary to [facility] cirrhosis, stable. ?  ?Hyponatremia ?Secondary to hypervolemic hyponatremia from [place] cirrhosis. Continue diuretics with furosemide and spironolactone as above. ?  ?Iron deficiency anemia ?Received IV iron while inpatient.  Hemoglobin stable.  Started on ferrous sulfate 325 mg p.o. daily ?  ?Type 2 diabetes mellitus ?Home regimen includes metformin 500 mg p.o. daily. ?  ?Hypothyroidism ?Levothyroxine 50 mcg p.o. daily ?  ?GERD: Protonix 40 mg p.o. twice daily ?  ?Debility/physical deconditioning: ?Discharging to SNF for further rehabilitation. ? ? ? ?Discharge Diagnoses:  ?Principal Problem: ?  Incarcerated umbilical hernia ?Active Problems: ?  NASH Cirrhosis of liver with ascites ([place]) ?  Hypothyroidism ?  Thrombocytopenia ([place]) ?  Controlled NIDDM-2 without complication ?  GERD (gastroesophageal reflux disease) ?  Hyperkalemia ?  Hyponatremia ?  Physical deconditioning ? ? ? ?Discharge Instructions ? ?Discharge Instructions   ? ? Diet - low sodium heart healthy   Complete by: As directed ?  ? Increase activity slowly   Complete by: As directed ?  ? No wound care   Complete by: As directed ?  ? ?  ? ?Allergies as of 09/20/2021   ?No Known Allergies ?  ? ?  ?Medication List  ?  ? ?TAKE these medications   ? ?docusate sodium 100 MG capsule ?  Commonly known as: COLACE ?Take  1 capsule (100 mg total) by mouth 2 (two) times daily. ?  ?ferrous sulfate 325 (65 FE) MG tablet ?Take 1 tablet (325 mg total) by mouth daily with breakfast. ?Start taking on: Sep 21, 2021 ?  ?fluticasone 50 MCG/ACT nasal spray ?Commonly known as: FLONASE ?Place 1 spray into both nostrils daily. ?  ?furosemide 40 MG tablet ?Commonly known as: LASIX ?Take 1 tablet (40 mg total) by mouth daily. ?  ?levothyroxine 50 MCG tablet ?Commonly known as: SYNTHROID ?Take 1 tablet (50 mcg total) by mouth daily. ?  ?metFORMIN 500 MG tablet ?Commonly known as: GLUCOPHAGE ?Take 500 mg by mouth every evening. Take 1 tablet by mouth once daily ?  ?methocarbamol 500 MG tablet ?Commonly known as: ROBAXIN ?Take 1 tablet (500 mg total) by mouth every 6 (six) hours as needed for muscle spasms. ?  ?montelukast 10 MG tablet ?Commonly known as: SINGULAIR ?Take 10 mg by mouth at bedtime. ?  ?multivitamin tablet ?Take 1 tablet by mouth at bedtime. ?  ?oxyCODONE 5 MG immediate release tablet ?Commonly known as: Oxy IR/ROXICODONE ?Take 1 tablet (5 mg total) by mouth every 4 (four) hours as needed for moderate pain. ?  ?pantoprazole 40 MG tablet ?Commonly known as: PROTONIX ?Take 1 tablet (40 mg total) by mouth 2 (two) times daily. ?  ?polyethylene glycol 17 g packet ?Commonly known as: MIRALAX / GLYCOLAX ?Take 17 g by mouth 2 (two) times daily. ?What changed:  ?how much to take ?when to take this ?  ?spironolactone 100 MG tablet ?Commonly known as: ALDACTONE ?Take 2 tablets (200 mg total) by mouth daily. ?  ?vitamin C 500 MG tablet ?Commonly known as: ASCORBIC ACID ?Take 500 mg by mouth daily. ?  ?Vitamin D 50 MCG (2000 UT) Caps ?Take 2,000 Units by mouth at bedtime. ?  ? ?  ? ? Follow-up Information   ? ? Stechschulte, Nickola Major, MD. Go on 10/03/2021.   ?Specialty: Surgery ?Why: Your appointment is 10/03/21 at 10:20 am  ?Please arrive 30 minutes prior to your appointment to check in and fill out paperwork. Bring photo ID and insurance  information. ?Contact information: ?[address] ?[place] [address] ?[phone] ? ? ?  ?  ? ? Vernie Shanks, MD. Schedule an appointment as soon as possible for a visit in 1 week(s).   ?Specialty: Family Medicine ?Contact information: ?[address] ?[place] [address] ?[phone] ? ? ?  ?  ? ?  ?  ? ?  ? ?No Known Allergies ? ?Consultations: ?General surgery ? ? ?Procedures/Studies: ?CT ABDOMEN PELVIS W CONTRAST ? ?Result Date: 09/12/2021 ?CLINICAL DATA:  Acute abdominal pain. Status post paracentesis. History of hepatocellular carcinoma. EXAM: CT ABDOMEN AND PELVIS WITH CONTRAST TECHNIQUE: Multidetector CT imaging of the abdomen and pelvis was performed using the standard protocol following bolus administration of intravenous contrast. RADIATION DOSE REDUCTION: This exam was performed according to the departmental dose-optimization program which includes automated exposure control, adjustment of the mA and/or kV according to patient size and/or use of iterative reconstruction technique. CONTRAST:  110m OMNIPAQUE IOHEXOL 300 MG/ML  SOLN COMPARISON:  CT abdomen and pelvis 07/24/2018. MRI abdomen 07/05/2019. FINDINGS: Lower chest: There is a calcified granuloma in the left lower lung. Lung bases are otherwise clear. Hepatobiliary: Again seen is nodular liver contour compatible with cirrhosis. Gallbladder surgically absent. No biliary ductal dilatation. There is a new punctate density in the region of patient's known right hepatic  lesion. This hepatic lesion is not well defined on this study. No new hepatic lesions are identified. Pancreas: Unremarkable. No pancreatic ductal dilatation or surrounding inflammatory changes. Spleen: Calcified granulomas are again seen. Spleen is mildly enlarged, unchanged. Adrenals/Urinary Tract: Adrenal glands are unremarkable. Kidneys are normal, without renal calculi, focal lesion, or hydronephrosis. Bladder is unremarkable. Stomach/Bowel: There is a large  umbilical hernia containing a single loop of dilated small bowel. This is transition point for small bowel obstruction. Small bowel loops proximal to this level are mildly dilated with air-fluid levels. The stomach is

## 2021-09-20 NOTE — Plan of Care (Signed)
?  Problem: Health Behavior/Discharge Planning: ?Goal: Ability to manage health-related needs will improve ?Outcome: Progressing ?  ?Problem: Clinical Measurements: ?Goal: Ability to maintain clinical measurements within normal limits will improve ?Outcome: Progressing ?  ?Problem: Activity: ?Goal: Risk for activity intolerance will decrease ?Outcome: Progressing ?  ?Problem: Clinical Measurements: ?Goal: Will remain free from infection ?Outcome: Progressing ?  ?

## 2021-09-21 DIAGNOSIS — R5381 Other malaise: Secondary | ICD-10-CM | POA: Diagnosis not present

## 2021-09-21 DIAGNOSIS — Z8616 Personal history of COVID-19: Secondary | ICD-10-CM | POA: Diagnosis not present

## 2021-09-21 DIAGNOSIS — M6259 Muscle wasting and atrophy, not elsewhere classified, multiple sites: Secondary | ICD-10-CM | POA: Diagnosis not present

## 2021-09-21 DIAGNOSIS — M6250 Muscle wasting and atrophy, not elsewhere classified, unspecified site: Secondary | ICD-10-CM | POA: Diagnosis not present

## 2021-09-21 DIAGNOSIS — R1312 Dysphagia, oropharyngeal phase: Secondary | ICD-10-CM | POA: Diagnosis not present

## 2021-09-21 DIAGNOSIS — K766 Portal hypertension: Secondary | ICD-10-CM | POA: Diagnosis not present

## 2021-09-21 DIAGNOSIS — I959 Hypotension, unspecified: Secondary | ICD-10-CM | POA: Diagnosis not present

## 2021-09-21 DIAGNOSIS — K5900 Constipation, unspecified: Secondary | ICD-10-CM | POA: Diagnosis not present

## 2021-09-21 DIAGNOSIS — Z8719 Personal history of other diseases of the digestive system: Secondary | ICD-10-CM | POA: Diagnosis not present

## 2021-09-21 DIAGNOSIS — E119 Type 2 diabetes mellitus without complications: Secondary | ICD-10-CM | POA: Diagnosis not present

## 2021-09-21 DIAGNOSIS — M6281 Muscle weakness (generalized): Secondary | ICD-10-CM | POA: Diagnosis not present

## 2021-09-21 DIAGNOSIS — G4720 Circadian rhythm sleep disorder, unspecified type: Secondary | ICD-10-CM | POA: Diagnosis not present

## 2021-09-21 DIAGNOSIS — E871 Hypo-osmolality and hyponatremia: Secondary | ICD-10-CM | POA: Diagnosis not present

## 2021-09-21 DIAGNOSIS — R031 Nonspecific low blood-pressure reading: Secondary | ICD-10-CM | POA: Diagnosis not present

## 2021-09-21 DIAGNOSIS — R2681 Unsteadiness on feet: Secondary | ICD-10-CM | POA: Diagnosis not present

## 2021-09-21 DIAGNOSIS — D649 Anemia, unspecified: Secondary | ICD-10-CM | POA: Diagnosis not present

## 2021-09-21 DIAGNOSIS — D696 Thrombocytopenia, unspecified: Secondary | ICD-10-CM | POA: Diagnosis not present

## 2021-09-21 DIAGNOSIS — K5901 Slow transit constipation: Secondary | ICD-10-CM | POA: Diagnosis not present

## 2021-09-21 DIAGNOSIS — Z7401 Bed confinement status: Secondary | ICD-10-CM | POA: Diagnosis not present

## 2021-09-21 DIAGNOSIS — R2689 Other abnormalities of gait and mobility: Secondary | ICD-10-CM | POA: Diagnosis not present

## 2021-09-21 DIAGNOSIS — Z9049 Acquired absence of other specified parts of digestive tract: Secondary | ICD-10-CM | POA: Diagnosis not present

## 2021-09-21 DIAGNOSIS — E118 Type 2 diabetes mellitus with unspecified complications: Secondary | ICD-10-CM | POA: Diagnosis not present

## 2021-09-21 DIAGNOSIS — R41841 Cognitive communication deficit: Secondary | ICD-10-CM | POA: Diagnosis not present

## 2021-09-21 DIAGNOSIS — K7460 Unspecified cirrhosis of liver: Secondary | ICD-10-CM | POA: Diagnosis not present

## 2021-09-21 DIAGNOSIS — R262 Difficulty in walking, not elsewhere classified: Secondary | ICD-10-CM | POA: Diagnosis not present

## 2021-09-21 DIAGNOSIS — K7581 Nonalcoholic steatohepatitis (NASH): Secondary | ICD-10-CM | POA: Diagnosis not present

## 2021-09-21 DIAGNOSIS — K420 Umbilical hernia with obstruction, without gangrene: Secondary | ICD-10-CM | POA: Diagnosis not present

## 2021-09-21 DIAGNOSIS — R609 Edema, unspecified: Secondary | ICD-10-CM | POA: Diagnosis not present

## 2021-09-21 DIAGNOSIS — E039 Hypothyroidism, unspecified: Secondary | ICD-10-CM | POA: Diagnosis not present

## 2021-09-25 ENCOUNTER — Other Ambulatory Visit: Payer: Self-pay

## 2021-09-25 DIAGNOSIS — E118 Type 2 diabetes mellitus with unspecified complications: Secondary | ICD-10-CM | POA: Diagnosis not present

## 2021-09-25 DIAGNOSIS — Z8719 Personal history of other diseases of the digestive system: Secondary | ICD-10-CM | POA: Diagnosis not present

## 2021-09-25 DIAGNOSIS — E039 Hypothyroidism, unspecified: Secondary | ICD-10-CM | POA: Diagnosis not present

## 2021-09-25 DIAGNOSIS — D696 Thrombocytopenia, unspecified: Secondary | ICD-10-CM | POA: Diagnosis not present

## 2021-09-25 DIAGNOSIS — K420 Umbilical hernia with obstruction, without gangrene: Secondary | ICD-10-CM | POA: Diagnosis not present

## 2021-09-25 DIAGNOSIS — R5381 Other malaise: Secondary | ICD-10-CM | POA: Diagnosis not present

## 2021-09-25 DIAGNOSIS — K766 Portal hypertension: Secondary | ICD-10-CM | POA: Diagnosis not present

## 2021-09-25 DIAGNOSIS — E871 Hypo-osmolality and hyponatremia: Secondary | ICD-10-CM | POA: Diagnosis not present

## 2021-09-25 DIAGNOSIS — R031 Nonspecific low blood-pressure reading: Secondary | ICD-10-CM | POA: Diagnosis not present

## 2021-09-25 DIAGNOSIS — D649 Anemia, unspecified: Secondary | ICD-10-CM | POA: Diagnosis not present

## 2021-09-25 DIAGNOSIS — R609 Edema, unspecified: Secondary | ICD-10-CM | POA: Diagnosis not present

## 2021-09-25 DIAGNOSIS — K7581 Nonalcoholic steatohepatitis (NASH): Secondary | ICD-10-CM | POA: Diagnosis not present

## 2021-09-25 DIAGNOSIS — G4720 Circadian rhythm sleep disorder, unspecified type: Secondary | ICD-10-CM | POA: Diagnosis not present

## 2021-09-26 ENCOUNTER — Other Ambulatory Visit: Payer: Medicare Other

## 2021-09-26 DIAGNOSIS — R2681 Unsteadiness on feet: Secondary | ICD-10-CM | POA: Diagnosis not present

## 2021-09-26 DIAGNOSIS — E119 Type 2 diabetes mellitus without complications: Secondary | ICD-10-CM | POA: Diagnosis not present

## 2021-09-26 DIAGNOSIS — K420 Umbilical hernia with obstruction, without gangrene: Secondary | ICD-10-CM | POA: Diagnosis not present

## 2021-09-26 DIAGNOSIS — K7460 Unspecified cirrhosis of liver: Secondary | ICD-10-CM | POA: Diagnosis not present

## 2021-09-26 DIAGNOSIS — M6250 Muscle wasting and atrophy, not elsewhere classified, unspecified site: Secondary | ICD-10-CM | POA: Diagnosis not present

## 2021-09-26 DIAGNOSIS — Z8616 Personal history of COVID-19: Secondary | ICD-10-CM | POA: Diagnosis not present

## 2021-09-27 ENCOUNTER — Telehealth: Payer: Self-pay

## 2021-09-27 DIAGNOSIS — Z8616 Personal history of COVID-19: Secondary | ICD-10-CM | POA: Diagnosis not present

## 2021-09-27 DIAGNOSIS — E119 Type 2 diabetes mellitus without complications: Secondary | ICD-10-CM | POA: Diagnosis not present

## 2021-09-27 DIAGNOSIS — K7460 Unspecified cirrhosis of liver: Secondary | ICD-10-CM | POA: Diagnosis not present

## 2021-09-27 DIAGNOSIS — M6250 Muscle wasting and atrophy, not elsewhere classified, unspecified site: Secondary | ICD-10-CM | POA: Diagnosis not present

## 2021-09-27 DIAGNOSIS — R2681 Unsteadiness on feet: Secondary | ICD-10-CM | POA: Diagnosis not present

## 2021-09-27 DIAGNOSIS — K420 Umbilical hernia with obstruction, without gangrene: Secondary | ICD-10-CM | POA: Diagnosis not present

## 2021-09-27 NOTE — Telephone Encounter (Signed)
-----   Message from Algernon Huxley, RN sent at 07/24/2021  1:48 PM EDT ----- Regarding: BMET Pt needs repeat BMET in [facility]

## 2021-09-27 NOTE — Telephone Encounter (Signed)
Pt had a bmet done at the  hospital when she had surgery on 5/5. She is now at [facility]. When do you want bmet repeated. Please advise.

## 2021-09-28 ENCOUNTER — Other Ambulatory Visit (HOSPITAL_COMMUNITY): Payer: Medicare Other

## 2021-10-01 DIAGNOSIS — K7460 Unspecified cirrhosis of liver: Secondary | ICD-10-CM | POA: Diagnosis not present

## 2021-10-01 DIAGNOSIS — R2681 Unsteadiness on feet: Secondary | ICD-10-CM | POA: Diagnosis not present

## 2021-10-01 DIAGNOSIS — K420 Umbilical hernia with obstruction, without gangrene: Secondary | ICD-10-CM | POA: Diagnosis not present

## 2021-10-01 DIAGNOSIS — K5900 Constipation, unspecified: Secondary | ICD-10-CM | POA: Diagnosis not present

## 2021-10-01 DIAGNOSIS — Z8616 Personal history of COVID-19: Secondary | ICD-10-CM | POA: Diagnosis not present

## 2021-10-01 DIAGNOSIS — M6250 Muscle wasting and atrophy, not elsewhere classified, unspecified site: Secondary | ICD-10-CM | POA: Diagnosis not present

## 2021-10-01 DIAGNOSIS — E119 Type 2 diabetes mellitus without complications: Secondary | ICD-10-CM | POA: Diagnosis not present

## 2021-10-01 NOTE — Telephone Encounter (Signed)
Now.  Thanks

## 2021-10-01 NOTE — Telephone Encounter (Signed)
Spoke with Nursing supervisor Varney Biles and orders for [facility] faxed to [phone] at [facility]. Requested results be faxed to [phone].

## 2021-10-02 ENCOUNTER — Other Ambulatory Visit: Payer: Medicare Other

## 2021-10-02 DIAGNOSIS — E871 Hypo-osmolality and hyponatremia: Secondary | ICD-10-CM | POA: Diagnosis not present

## 2021-10-02 DIAGNOSIS — K7581 Nonalcoholic steatohepatitis (NASH): Secondary | ICD-10-CM | POA: Diagnosis not present

## 2021-10-02 DIAGNOSIS — E118 Type 2 diabetes mellitus with unspecified complications: Secondary | ICD-10-CM | POA: Diagnosis not present

## 2021-10-02 DIAGNOSIS — Z9049 Acquired absence of other specified parts of digestive tract: Secondary | ICD-10-CM | POA: Diagnosis not present

## 2021-10-02 DIAGNOSIS — K5901 Slow transit constipation: Secondary | ICD-10-CM | POA: Diagnosis not present

## 2021-10-03 DIAGNOSIS — M6250 Muscle wasting and atrophy, not elsewhere classified, unspecified site: Secondary | ICD-10-CM | POA: Diagnosis not present

## 2021-10-03 DIAGNOSIS — R2681 Unsteadiness on feet: Secondary | ICD-10-CM | POA: Diagnosis not present

## 2021-10-03 DIAGNOSIS — Z8616 Personal history of COVID-19: Secondary | ICD-10-CM | POA: Diagnosis not present

## 2021-10-03 DIAGNOSIS — K420 Umbilical hernia with obstruction, without gangrene: Secondary | ICD-10-CM | POA: Diagnosis not present

## 2021-10-03 DIAGNOSIS — E119 Type 2 diabetes mellitus without complications: Secondary | ICD-10-CM | POA: Diagnosis not present

## 2021-10-03 DIAGNOSIS — K5900 Constipation, unspecified: Secondary | ICD-10-CM | POA: Diagnosis not present

## 2021-10-03 DIAGNOSIS — K7460 Unspecified cirrhosis of liver: Secondary | ICD-10-CM | POA: Diagnosis not present

## 2021-10-04 ENCOUNTER — Other Ambulatory Visit: Payer: Self-pay | Admitting: *Deleted

## 2021-10-04 DIAGNOSIS — R2681 Unsteadiness on feet: Secondary | ICD-10-CM | POA: Diagnosis not present

## 2021-10-04 DIAGNOSIS — K420 Umbilical hernia with obstruction, without gangrene: Secondary | ICD-10-CM | POA: Diagnosis not present

## 2021-10-04 DIAGNOSIS — K5900 Constipation, unspecified: Secondary | ICD-10-CM | POA: Diagnosis not present

## 2021-10-04 DIAGNOSIS — E119 Type 2 diabetes mellitus without complications: Secondary | ICD-10-CM | POA: Diagnosis not present

## 2021-10-04 DIAGNOSIS — M6250 Muscle wasting and atrophy, not elsewhere classified, unspecified site: Secondary | ICD-10-CM | POA: Diagnosis not present

## 2021-10-04 DIAGNOSIS — K7460 Unspecified cirrhosis of liver: Secondary | ICD-10-CM | POA: Diagnosis not present

## 2021-10-04 DIAGNOSIS — Z8616 Personal history of COVID-19: Secondary | ICD-10-CM | POA: Diagnosis not present

## 2021-10-04 NOTE — Patient Outreach (Signed)
Per [place] eligible member currently resides in [facility].  Screened for potential post SNF care coordination needs.    Ms. Natal admitted to SNF on 09/21/21 after hospitalization.  Member's PCP at [facility] at [facility] has [facility] care management services.   Update received from [place] [place] indicating member declined home health services due to not wanting people in her home. Plans to transition home likely next week.   Will send notification to [facility] upon SNF discharge.   Marthenia Rolling, MSN, RN,BSN [place] Acute Care Coordinator [phone] [facility]) [phone]  (Toll free office)

## 2021-10-09 DIAGNOSIS — M6250 Muscle wasting and atrophy, not elsewhere classified, unspecified site: Secondary | ICD-10-CM | POA: Diagnosis not present

## 2021-10-09 DIAGNOSIS — K7460 Unspecified cirrhosis of liver: Secondary | ICD-10-CM | POA: Diagnosis not present

## 2021-10-09 DIAGNOSIS — K5900 Constipation, unspecified: Secondary | ICD-10-CM | POA: Diagnosis not present

## 2021-10-09 DIAGNOSIS — Z8616 Personal history of COVID-19: Secondary | ICD-10-CM | POA: Diagnosis not present

## 2021-10-09 DIAGNOSIS — K420 Umbilical hernia with obstruction, without gangrene: Secondary | ICD-10-CM | POA: Diagnosis not present

## 2021-10-09 DIAGNOSIS — E119 Type 2 diabetes mellitus without complications: Secondary | ICD-10-CM | POA: Diagnosis not present

## 2021-10-09 DIAGNOSIS — R2681 Unsteadiness on feet: Secondary | ICD-10-CM | POA: Diagnosis not present

## 2021-10-10 DIAGNOSIS — K420 Umbilical hernia with obstruction, without gangrene: Secondary | ICD-10-CM | POA: Diagnosis not present

## 2021-10-10 DIAGNOSIS — E118 Type 2 diabetes mellitus with unspecified complications: Secondary | ICD-10-CM | POA: Diagnosis not present

## 2021-10-10 DIAGNOSIS — E039 Hypothyroidism, unspecified: Secondary | ICD-10-CM | POA: Diagnosis not present

## 2021-10-10 DIAGNOSIS — Z9049 Acquired absence of other specified parts of digestive tract: Secondary | ICD-10-CM | POA: Diagnosis not present

## 2021-10-10 DIAGNOSIS — K766 Portal hypertension: Secondary | ICD-10-CM | POA: Diagnosis not present

## 2021-10-10 DIAGNOSIS — E871 Hypo-osmolality and hyponatremia: Secondary | ICD-10-CM | POA: Diagnosis not present

## 2021-10-10 DIAGNOSIS — R609 Edema, unspecified: Secondary | ICD-10-CM | POA: Diagnosis not present

## 2021-10-10 DIAGNOSIS — K7581 Nonalcoholic steatohepatitis (NASH): Secondary | ICD-10-CM | POA: Diagnosis not present

## 2021-10-11 ENCOUNTER — Other Ambulatory Visit: Payer: Self-pay | Admitting: *Deleted

## 2021-10-11 DIAGNOSIS — R2681 Unsteadiness on feet: Secondary | ICD-10-CM | POA: Diagnosis not present

## 2021-10-11 DIAGNOSIS — M6250 Muscle wasting and atrophy, not elsewhere classified, unspecified site: Secondary | ICD-10-CM | POA: Diagnosis not present

## 2021-10-11 DIAGNOSIS — K420 Umbilical hernia with obstruction, without gangrene: Secondary | ICD-10-CM | POA: Diagnosis not present

## 2021-10-11 DIAGNOSIS — Z8616 Personal history of COVID-19: Secondary | ICD-10-CM | POA: Diagnosis not present

## 2021-10-11 DIAGNOSIS — K5900 Constipation, unspecified: Secondary | ICD-10-CM | POA: Diagnosis not present

## 2021-10-11 DIAGNOSIS — E119 Type 2 diabetes mellitus without complications: Secondary | ICD-10-CM | POA: Diagnosis not present

## 2021-10-11 DIAGNOSIS — K7460 Unspecified cirrhosis of liver: Secondary | ICD-10-CM | POA: Diagnosis not present

## 2021-10-11 NOTE — Patient Outreach (Signed)
[facility] Post- Acute Care Coordinator follow up. Per [place] eligible member currently resides in [facility].  Lorette Ang PCP at [facility] at [facility] has [facility] care management services available.   Facility visit to [facility] skilled nursing facility. Met with Winona Legato, SNF SW who reports member will transition home today 10/11/21. Ms. Doffing declined home health services.   Will send notification to [facility] care management.    Marthenia Rolling, MSN, RN,BSN [place] Acute Care Coordinator [phone] [facility]) [phone]  (Toll free office)

## 2021-10-19 ENCOUNTER — Ambulatory Visit: Payer: Medicare Other | Admitting: Internal Medicine

## 2021-10-19 NOTE — Patient Outreach (Signed)
[facility]) Care Management  10/19/2021  Crystal Barnett [birth year] [id number]   Received referral for Care Management from Insurance plan. Assigned patient to Joellyn Quails, RN Care Coordinator for follow up.   [place] Management Assistant [phone]

## 2021-10-26 DIAGNOSIS — E1169 Type 2 diabetes mellitus with other specified complication: Secondary | ICD-10-CM | POA: Diagnosis not present

## 2021-10-26 DIAGNOSIS — I10 Essential (primary) hypertension: Secondary | ICD-10-CM | POA: Diagnosis not present

## 2021-10-26 DIAGNOSIS — Z0000 Encounter for general adult medical examination without abnormal findings: Secondary | ICD-10-CM | POA: Diagnosis not present

## 2021-10-26 DIAGNOSIS — Z1389 Encounter for screening for other disorder: Secondary | ICD-10-CM | POA: Diagnosis not present

## 2021-10-26 DIAGNOSIS — D696 Thrombocytopenia, unspecified: Secondary | ICD-10-CM | POA: Diagnosis not present

## 2021-10-26 DIAGNOSIS — E039 Hypothyroidism, unspecified: Secondary | ICD-10-CM | POA: Diagnosis not present

## 2021-10-26 DIAGNOSIS — M810 Age-related osteoporosis without current pathological fracture: Secondary | ICD-10-CM | POA: Diagnosis not present

## 2021-10-26 DIAGNOSIS — E559 Vitamin D deficiency, unspecified: Secondary | ICD-10-CM | POA: Diagnosis not present

## 2021-11-02 ENCOUNTER — Other Ambulatory Visit: Payer: Self-pay | Admitting: *Deleted

## 2021-11-02 DIAGNOSIS — C220 Liver cell carcinoma: Secondary | ICD-10-CM

## 2021-11-07 ENCOUNTER — Encounter: Payer: Self-pay | Admitting: *Deleted

## 2021-11-07 ENCOUNTER — Other Ambulatory Visit: Payer: Self-pay | Admitting: *Deleted

## 2021-11-07 DIAGNOSIS — C220 Liver cell carcinoma: Secondary | ICD-10-CM | POA: Insufficient documentation

## 2021-11-07 DIAGNOSIS — K7581 Nonalcoholic steatohepatitis (NASH): Secondary | ICD-10-CM | POA: Insufficient documentation

## 2021-11-07 DIAGNOSIS — I10 Essential (primary) hypertension: Secondary | ICD-10-CM | POA: Insufficient documentation

## 2021-11-07 DIAGNOSIS — M810 Age-related osteoporosis without current pathological fracture: Secondary | ICD-10-CM | POA: Insufficient documentation

## 2021-11-07 DIAGNOSIS — B079 Viral wart, unspecified: Secondary | ICD-10-CM | POA: Insufficient documentation

## 2021-11-07 DIAGNOSIS — E559 Vitamin D deficiency, unspecified: Secondary | ICD-10-CM | POA: Insufficient documentation

## 2021-11-07 DIAGNOSIS — R188 Other ascites: Secondary | ICD-10-CM | POA: Insufficient documentation

## 2021-11-07 NOTE — Patient Outreach (Signed)
[facility]) Care Management Telephonic RN Care Manager Note   11/07/2021 Name:  Crystal Barnett MRN:  [id number] DOB:  1940/08/21  Summary: Successful outreach to patient but she confirms she is being followed by an external pharmacy/care management vendor She denies care coordination, disease management needs that is not being addressed by the staff at her pcp office She shares that Dr Merita Norton will be retiring and she will be assigned a new pcp in August 2023  She reports hernia healing  She requests case closure but is appreciative of the outreach   Recommendations/Changes made from today's visit: Initial outreach with case closure as pt confirms she is being followed by an external Administrator, sports at her pcp office after RN CM reviewed [facility] services with her  Voiced understanding Case closure    Subjective: Crystal Barnett is an 81 y.o. year old female who is a primary patient of Jacelyn Grip, Edwyna Shell, MD. The care management team was consulted for assistance with care management and/or care coordination needs.    Telephonic RN Care Manager completed Telephone Visit today.   Objective:  Medications Reviewed Today     Reviewed by Kayleen Memos, RN (Registered Nurse) on 09/12/21 at [phone]  Med List Status: Complete   Medication Order Taking? Sig Documenting Provider Last Dose Status Informant  albumin human 25 % solution 12.5 g [id number]   Irene Shipper, MD  Active   Cholecalciferol (VITAMIN D) 50 MCG (2000 UT) CAPS [id number] Yes Take 2,000 Units by mouth at bedtime. [provider] 09/10/2021 Active Self  fluticasone (FLONASE) 50 MCG/ACT nasal spray [id number] Yes Place 1 spray into both nostrils daily. [provider] 09/10/2021 Active Self  furosemide (LASIX) 40 MG tablet [id number] Yes Take 1 tablet (40 mg total) by mouth daily. Irene Shipper, MD 09/11/2021 Active Self  levothyroxine (SYNTHROID) 50 MCG tablet [id number] Yes Take 1 tablet (50 mcg  total) by mouth daily. Renato Shin, MD 09/11/2021 Active Self  metFORMIN (GLUCOPHAGE) 500 MG tablet [id number] Yes Take 500 mg by mouth every evening. Take 1 tablet by mouth once daily [provider] 09/10/2021 Active Self  montelukast (SINGULAIR) 10 MG tablet [id number] Yes Take 10 mg by mouth at bedtime.  [provider] 09/10/2021 Active Self           Med Note (DAVIS, SOPHIA A   Mon Feb 17, 2017  8:38 AM)    Multiple Vitamin (MULTIVITAMIN) tablet [id number] Yes Take 1 tablet by mouth at bedtime.  [provider] 09/10/2021 Active Self  pantoprazole (PROTONIX) 40 MG tablet [id number] Yes Take 1 tablet (40 mg total) by mouth 2 (two) times daily. Irene Shipper, MD 09/11/2021 Active Self  polyethylene glycol (MIRALAX / GLYCOLAX) 17 g packet [id number] Yes Take 8.5-17 g by mouth daily. [provider] 09/11/2021 Active Self  spironolactone (ALDACTONE) 100 MG tablet [id number] Yes Take 2 tablets (200 mg total) by mouth daily. Irene Shipper, MD 09/11/2021 Active Self  vitamin C (ASCORBIC ACID) 500 MG tablet [id number] Yes Take 500 mg by mouth daily. [provider] 09/10/2021 Active Self             SDOH:  (Social Determinants of Health) assessments and interventions performed:    Care Plan  Review of patient past medical history, allergies, medications, health status, including review of consultants reports, laboratory and other test data, was performed as part of comprehensive evaluation for care management services.   There are no  care plans that you recently modified to display for this patient.    Plan: The patient has been provided with contact information for the care management team and has been advised to call with any health related questions or concerns.  Case closure has external cm services  Xianna Siverling L. Lavina Hamman, RN, BSN, [place] Coordinator Office number [phone] [facility] number [phone] Fax number  [phone]

## 2021-11-15 ENCOUNTER — Other Ambulatory Visit (HOSPITAL_COMMUNITY)
Admit: 2021-11-15 | Discharge: 2021-11-15 | Disposition: A | Discharge status: Home / Self Care | Payer: Medicare Other | Source: Ambulatory Visit | Attending: Interventional Radiology | Admitting: Interventional Radiology

## 2021-11-15 DIAGNOSIS — C220 Liver cell carcinoma: Secondary | ICD-10-CM | POA: Insufficient documentation

## 2021-11-15 LAB — CBC
HCT: 36.8 % (ref 36.0–46.0)
Hemoglobin: 12.2 g/dL (ref 12.0–15.0)
MCH: 30 pg (ref 26.0–34.0)
MCHC: 33.2 g/dL (ref 30.0–36.0)
MCV: 90.6 fL (ref 80.0–100.0)
Platelets: 125 10*3/uL — ABNORMAL LOW (ref 150–400)
RBC: 4.06 MIL/uL (ref 3.87–5.11)
RDW: 14.5 % (ref 11.5–15.5)
WBC: 3.1 10*3/uL — ABNORMAL LOW (ref 4.0–10.5)
nRBC: 0 % (ref 0.0–0.2)

## 2021-11-15 LAB — COMPREHENSIVE METABOLIC PANEL
ALT: 17 U/L (ref 0–44)
AST: 25 U/L (ref 15–41)
Albumin: 3.4 g/dL — ABNORMAL LOW (ref 3.5–5.0)
Alkaline Phosphatase: 88 U/L (ref 38–126)
Anion gap: 6 (ref 5–15)
BUN: 17 mg/dL (ref 8–23)
CO2: 24 mmol/L (ref 22–32)
Calcium: 8.9 mg/dL (ref 8.9–10.3)
Chloride: 105 mmol/L (ref 98–111)
Creatinine, Ser: 0.76 mg/dL (ref 0.44–1.00)
GFR, Estimated: >60 mL/min (ref >60)
Glucose, Bld: 89 mg/dL (ref 70–99)
Potassium: 4.8 mmol/L (ref 3.5–5.1)
Sodium: 135 mmol/L (ref 135–145)
Total Bilirubin: 1 mg/dL (ref 0.3–1.2)
Total Protein: 6.7 g/dL (ref 6.5–8.1)

## 2021-11-15 LAB — PROTIME-INR
INR: 1.1 (ref 0.8–1.2)
Prothrombin Time: 13.9 seconds (ref 11.4–15.2)

## 2021-11-16 ENCOUNTER — Ambulatory Visit (HOSPITAL_COMMUNITY)
Admission: RE | Admit: 2021-11-16 | Discharge: 2021-11-16 | Disposition: A | Discharge status: Home / Self Care | Payer: Medicare Other | Source: Ambulatory Visit | Attending: Interventional Radiology | Admitting: Interventional Radiology

## 2021-11-16 DIAGNOSIS — C220 Liver cell carcinoma: Secondary | ICD-10-CM | POA: Diagnosis not present

## 2021-11-16 DIAGNOSIS — I8500 Esophageal varices without bleeding: Secondary | ICD-10-CM | POA: Diagnosis not present

## 2021-11-16 DIAGNOSIS — K766 Portal hypertension: Secondary | ICD-10-CM | POA: Diagnosis not present

## 2021-11-16 DIAGNOSIS — K7460 Unspecified cirrhosis of liver: Secondary | ICD-10-CM | POA: Diagnosis not present

## 2021-11-16 DIAGNOSIS — R188 Other ascites: Secondary | ICD-10-CM | POA: Diagnosis not present

## 2021-11-16 LAB — AFP TUMOR MARKER: AFP, Serum, Tumor Marker: 3 ng/mL (ref 0.0–8.7)

## 2021-11-16 MED ORDER — GADOBUTROL 1 MMOL/ML IV SOLN
7.0000 mL | Freq: Once | INTRAVENOUS | Status: AC | PRN
  Administered 2021-11-16: 7 mL via INTRAVENOUS

## 2021-11-19 ENCOUNTER — Telehealth: Payer: Self-pay | Admitting: Hematology

## 2021-11-19 NOTE — Telephone Encounter (Signed)
Rescheduled upcoming appointment due to provider's PAL. Patient is aware of changes. ?

## 2021-11-20 ENCOUNTER — Ambulatory Visit
Admission: RE | Admit: 2021-11-20 | Discharge: 2021-11-20 | Disposition: A | Discharge status: Home / Self Care | Payer: Medicare Other | Source: Ambulatory Visit | Attending: Interventional Radiology | Admitting: Interventional Radiology

## 2021-11-20 ENCOUNTER — Encounter: Payer: Self-pay | Admitting: *Deleted

## 2021-11-20 DIAGNOSIS — C220 Liver cell carcinoma: Secondary | ICD-10-CM

## 2021-11-20 DIAGNOSIS — K7581 Nonalcoholic steatohepatitis (NASH): Secondary | ICD-10-CM | POA: Diagnosis not present

## 2021-11-20 DIAGNOSIS — R188 Other ascites: Secondary | ICD-10-CM | POA: Diagnosis not present

## 2021-11-20 HISTORY — PX: IR RADIOLOGIST EVAL & MGMT: IMG5224

## 2021-11-20 NOTE — Progress Notes (Signed)
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    Referring Physician(s): Dr. Irene Limbo  History of Present Illness: Crystal Barnett is a 81 y.o. 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 MRI imaging was performed first in December 2016 to evaluate a region of possible concern in the left hepatic lobe. On that study, there was a small 9 mm enhancing focus in hepatic segment 7 in the posterosuperior aspect of the hepatic dome. This subsequently been followed at 6 month intervals with repeat MRI scans of the abdomen.   MRI from 04/16/18 demonstrates more definitive enlargement of the lesion now measuring up to 1.9 cm.  Additionally, there appears to be an enhancing pseudocapsule.  On the present study, the imaging characteristics are consistent with a Li-RADS 5 lesion which is diagnostic of hepatocellular carcinoma.  The very slow growth rate over the past 3 years suggests a low-grade or indolent malignancy at this time.   Due to the presence of ascites and the location of the lesion high in the right dome just under the lung, we elected to proceed with transarterial radio embolization segmentectomy (7) which was performed on 11/17/2018.   Today, we are having our 42-monthpost procedure follow-up consultation via telemedicine.  Crystal Barnett doing well.  She denies fatigue, abdominal pain, nausea, vomiting or other clinical symptoms.     MRI 03/11/19: Treated segment 7 right liver mass is decreased in size and demonstrates no internal solid enhancement to suggest residual or recurrent viability. Geographic signal intensity changes at the liver dome surrounding the treated lesion, favor post treatment change. No new liver masses. Continued MRI surveillance recommended.   MRI 07/05/19: Stable appearance of treated segment 7 right liver lesion which  is decreased in size without internal enhancement. No new liver Lesions.   MRI 10/01/19: Unchanged ablation site of the liver dome, hepatic segment VII, without residual contrast enhancement. No evidence of viability or recurrence. LI-RADS 5T. No new suspicious liver lesions.   MRI  12/31/19: Similar size of ablation defect within the high right hepatic lobe, without findings of recurrent or metastatic disease.  Cirrhosis and portal venous hypertension, without new hepatocellular carcinoma.   MRI 03/20/20: At the site of the prior segment 7 hepatocellular carcinoma that was shown on 04/16/2018, there is a small nonenhancing 1.0 by 0.6 cm ablation site with no significant recurrent arterial phase enhancement to suggest active malignancy.   MRI 09/24/20: Stable small nonenhancing lesion at the hepatic dome in segment 7 consistent with treated disease. No findings suspicious for recurrent tumor and no new early arterial phase enhancing hepatic lesions to suggest hepatoma or dysplastic nodule.   MRI 04/02/21: Stable post treatment changes in the right lobe of the liver, with no new hypervascular lesion to suggest new or recurrent hepatocellular carcinoma.  MRI 11/16/21: Stable post treatment changes involving the right hepatic lobe in segment 7. No findings for dysplastic nodules or HCC.   I spoke with Crystal Barnett the telephone today.  Unfortunately, back in May she was admitted to the hospital with an incarcerated umbilical hernia which required emergency surgery.  She spent nearly a month in the hospital and rehab but is now returned to her home.  She continues to have some ascites but is currently asymptomatic.  Her last drainages were performed during her hospital admission in May.  She has not  required paracentesis at all in the month of June or thus far in July.  She is regaining her strength and overall doing well.  Past Medical History:  Diagnosis Date   ALLERGIC RHINITIS 01/12/2008   Allergy     Cancer ([place])    liver cancer hepatocellular carcinoma per pt    Cataract    bilateral   Colon polyps    hyperplastic   Constipation    ongoing issues per pt   Diabetes ([place])    Esophageal varices ([place])    Fatty liver    GOITER, MULTINODULAR 01/12/2008   Hepatic cirrhosis ([place])    History of radiation therapy 11/20/2018   x 1 radiation for liver cancer   Hypertension    under control   HYPOTHYROIDISM 5/46/5681   Lichen planus    In the mouth, Notes that she's had this for at least 20 years   Liver cirrhosis secondary to NASH ([place])    Osteoporosis    Portal venous hypertension ([place])    Splenomegaly    Thrombocytopenia ([place])    Appears chronic from at least 2014   Vitamin D deficiency     Past Surgical History:  Procedure Laterality Date   arthroscopic left knee  2005   BOWEL RESECTION N/A 09/12/2021   Procedure: SMALL BOWEL RESECTION;  Surgeon: Felicie Morn, MD;  Location: [place];  Service: General;  Laterality: N/A;   BREAST BIOPSY Left 2017   BREAST EXCISIONAL BIOPSY Right 2008   BREAST SURGERY  2008   Breast Biopsy    CHOLECYSTECTOMY  1985   COLONOSCOPY     IR ANGIOGRAM SELECTIVE EACH ADDITIONAL VESSEL  11/03/2018   IR ANGIOGRAM SELECTIVE EACH ADDITIONAL VESSEL  11/03/2018   IR ANGIOGRAM SELECTIVE EACH ADDITIONAL VESSEL  11/03/2018   IR ANGIOGRAM SELECTIVE EACH ADDITIONAL VESSEL  11/03/2018   IR ANGIOGRAM SELECTIVE EACH ADDITIONAL VESSEL  11/20/2018   IR ANGIOGRAM SELECTIVE EACH ADDITIONAL VESSEL  11/20/2018   IR ANGIOGRAM SELECTIVE EACH ADDITIONAL VESSEL  11/20/2018   IR ANGIOGRAM VISCERAL SELECTIVE  11/03/2018   IR ANGIOGRAM VISCERAL SELECTIVE  11/03/2018   IR ANGIOGRAM VISCERAL SELECTIVE  11/03/2018   IR ANGIOGRAM VISCERAL SELECTIVE  11/20/2018   IR ANGIOGRAM VISCERAL SELECTIVE  11/20/2018   IR EMBO TUMOR ORGAN ISCHEMIA INFARCT INC GUIDE ROADMAPPING  11/20/2018   IR GENERIC HISTORICAL  04/02/2016   IR RADIOLOGIST EVAL & MGMT 04/02/2016 Jacqulynn Cadet, MD [facility]  INTERV RAD   IR PARACENTESIS  02/02/2018   IR PARACENTESIS  04/20/2018   IR PARACENTESIS  12/03/2018   IR PARACENTESIS  07/15/2019   IR PARACENTESIS  02/25/2020   IR PARACENTESIS  06/22/2020   IR PARACENTESIS  08/08/2020   IR PARACENTESIS  09/18/2020   IR PARACENTESIS  12/06/2020   IR PARACENTESIS  03/23/2021   IR PARACENTESIS  09/05/2021   IR PARACENTESIS  09/10/2021   IR RADIOLOGIST EVAL & MGMT  10/29/2016   IR RADIOLOGIST EVAL & MGMT  04/29/2017   IR RADIOLOGIST EVAL & MGMT  11/04/2017   IR RADIOLOGIST EVAL & MGMT  04/16/2018   IR RADIOLOGIST EVAL & MGMT  12/08/2018   IR RADIOLOGIST EVAL & MGMT  03/16/2019   IR RADIOLOGIST EVAL & MGMT  07/08/2019   IR RADIOLOGIST EVAL & MGMT  10/07/2019   IR RADIOLOGIST EVAL & MGMT  01/05/2020   IR RADIOLOGIST EVAL & MGMT  03/23/2020   IR RADIOLOGIST EVAL & MGMT  09/26/2020   IR RADIOLOGIST EVAL & MGMT  04/11/2021   IR US GUIDE VASC ACCESS LEFT  11/20/2018   IR US GUIDE VASC ACCESS RIGHT  11/03/2018   POLYPECTOMY     UMBILICAL HERNIA REPAIR N/A 09/12/2021   Procedure: UMBILICAL HERNIA REPAIR;  Surgeon: Felicie Morn, MD;  Location: [place];  Service: General;  Laterality: N/A;   UPPER GASTROINTESTINAL ENDOSCOPY      Allergies: Patient has no known allergies.  Medications: Prior to Admission medications   Medication Sig Start Date End Date Taking? Authorizing Provider  Cholecalciferol (VITAMIN D) 50 MCG (2000 UT) CAPS Take 2,000 Units by mouth at bedtime.    [provider]  docusate sodium (COLACE) 100 MG capsule Take 1 capsule (100 mg total) by mouth 2 (two) times daily. 09/20/21   [place] Donnamarie Poag, DO  ferrous sulfate 325 (65 FE) MG tablet Take 1 tablet (325 mg total) by mouth daily with breakfast. 09/21/21   [place] Eric J, DO  fluticasone (FLONASE) 50 MCG/ACT nasal spray Place 1 spray into both nostrils daily. 08/21/21   [provider]  furosemide (LASIX) 40 MG tablet Take 1 tablet (40 mg total) by mouth daily. 06/12/21   Irene Shipper, MD   levothyroxine (SYNTHROID) 50 MCG tablet Take 1 tablet (50 mcg total) by mouth daily. 09/04/21   Renato Shin, MD  metFORMIN (GLUCOPHAGE) 500 MG tablet Take 500 mg by mouth every evening. Take 1 tablet by mouth once daily    [provider]  methocarbamol (ROBAXIN) 500 MG tablet Take 1 tablet (500 mg total) by mouth every 6 (six) hours as needed for muscle spasms. 09/20/21   [place] Eric J, DO  montelukast (SINGULAIR) 10 MG tablet Take 10 mg by mouth at bedtime.  03/25/14   [provider]  Multiple Vitamin (MULTIVITAMIN) tablet Take 1 tablet by mouth at bedtime.     [provider]  oxyCODONE (OXY IR/ROXICODONE) 5 MG immediate release tablet Take 1 tablet (5 mg total) by mouth every 4 (four) hours as needed for moderate pain. 09/20/21   [place] Eric J, DO  pantoprazole (PROTONIX) 40 MG tablet Take 1 tablet (40 mg total) by mouth 2 (two) times daily. 06/12/21   Irene Shipper, MD  polyethylene glycol (MIRALAX / GLYCOLAX) 17 g packet Take 17 g by mouth 2 (two) times daily. 09/20/21   [place] Donnamarie Poag, DO  spironolactone (ALDACTONE) 100 MG tablet Take 2 tablets (200 mg total) by mouth daily. 06/12/21   Irene Shipper, MD  vitamin C (ASCORBIC ACID) 500 MG tablet Take 500 mg by mouth daily.    [provider]     Family History  Problem Relation Age of Onset   Breast cancer Mother        in 10's   Stroke Father 83   Goiter Maternal Grandmother    Colon cancer Neg Hx    Colon polyps Neg Hx    Esophageal cancer Neg Hx    Stomach cancer Neg Hx    Rectal cancer Neg Hx    Pancreatic cancer Neg Hx     Social History   Socioeconomic History   Marital status: Single    Spouse name: Not on file   Number of children: 1   Years of education: Not on file   Highest education level: Not on file  Occupational History   Occupation: retired    Comment: works in Chiropodist for [employer]  Tobacco Use   Smoking status: Former    Types:  Cigarettes    Quit  date: 05/13/1990    Years since quitting: 31.5   Smokeless tobacco: Never  Vaping Use   Vaping Use: Never used  Substance and Sexual Activity   Alcohol use: No    Alcohol/week: 0.0 standard drinks of alcohol   Drug use: No   Sexual activity: Not on file  Other Topics Concern   Not on file  Social History Narrative   Not on file   Social Determinants of Health   Financial Resource Strain: Low Risk  (11/07/2021)   Overall Financial Resource Strain (CARDIA)    Difficulty of Paying Living Expenses: Not hard at all  Food Insecurity: No Food Insecurity (11/07/2021)   Hunger Vital Sign    Worried About Running Out of Food in the Last Year: Never true    Ran Out of Food in the Last Year: Never true  Transportation Needs: No Transportation Needs (11/07/2021)   PRAPARE - Hydrologist (Medical): No    Lack of Transportation (Non-Medical): No  Physical Activity: Not on file  Stress: No Stress Concern Present (11/07/2021)   [place]    Feeling of Stress : Not at all  Social Connections: Not on file    ECOG Status: 1 - Symptomatic but completely ambulatory  Review of Systems  Review of Systems: A 12 point ROS discussed and pertinent positives are indicated in the HPI above.  All other systems are negative.   Physical Exam No direct physical exam was performed (except for noted visual exam findings with Video Visits).    Vital Signs: There were no vitals taken for this visit.  Imaging: MR ABDOMEN WWO CONTRAST  Result Date: 11/18/2021 CLINICAL DATA:  Cirrhosis. Surveillance. EXAM: MRI ABDOMEN WITHOUT AND WITH CONTRAST TECHNIQUE: Multiplanar multisequence MR imaging of the abdomen was performed both before and after the administration of intravenous contrast. CONTRAST:  67m GADAVIST GADOBUTROL 1 MMOL/ML IV SOLN COMPARISON:  Multiple prior imaging studies. The most recent MRI is 04/01/2021  FINDINGS: Lower chest: No pulmonary lesions, pleural or pericardial effusion. Hepatobiliary: Stable severe cirrhotic changes involving the liver with portal venous hypertension portal venous collaterals. Paraesophageal varices. Stable post treatment changes involving the right hepatic lobe in segment 7 with a nonenhancing defect. No early arterial phase enhancing lesions to suggest dysplastic nodules or HCC. No intrahepatic biliary dilatation. The gallbladder is surgically absent. No common bile duct dilatation. Pancreas:  No mass, inflammation ductal dilatation. Spleen:  Borderline splenomegaly is stable. No splenic lesions. Adrenals/Urinary Tract: The adrenal glands are normal. No renal lesions or hydronephrosis. Stomach/Bowel: Stomach, duodenum, visualized small bowel and visualized colon are grossly normal. Vascular/Lymphatic: The aorta and branch vessels are patent. No aneurysm or dissection. The major venous structures are patent. No portal or splenic vein thrombosis. No mesenteric or retroperitoneal mass or adenopathy. Other: Moderate abdominal ascites and associated mesenteric and omental edema. Musculoskeletal: No significant bony findings. IMPRESSION: 1. Stable severe cirrhotic changes involving the liver with portal venous hypertension, portal venous collaterals and paraesophageal varices. 2. Stable post treatment changes involving the right hepatic lobe in segment 7. No findings for dysplastic nodules or HCC. 3. Moderate abdominal ascites and associated mesenteric and omental edema. Electronically Signed   By: PMarijo SanesM.D.   On: 11/18/2021 14:44    Labs:  CBC: Recent Labs    09/15/21 0038 09/16/21 0044 09/17/21 0132 11/15/21 1047  WBC 7.0 6.1 5.6 3.1*  HGB 11.0*  10.5* 10.2* 12.2  HCT 31.8* 29.7* 28.8* 36.8  PLT 107* 102* 139* 125*    COAGS: Recent Labs    04/04/21 1143 09/12/21 0432 11/15/21 1047  INR 1.0 1.2 1.1    BMP: Recent Labs    09/15/21 0858 09/16/21 0044  09/17/21 0132 11/15/21 1047  NA 131* 129* 130* 135  K 4.5 5.0 4.5 4.8  CL 97* 98 97* 105  CO2 27 23 26 24   GLUCOSE 103* 115* 88 89  BUN 29* 26* 21 17  CALCIUM 8.7* 8.5* 8.3* 8.9  CREATININE 0.95 0.92 0.74 0.76  GFRNONAA >60 >60 >60 >60    LIVER FUNCTION TESTS: Recent Labs    09/15/21 0038 09/15/21 0858 09/16/21 0044 09/17/21 0132 11/15/21 1047  BILITOT 1.0  --  0.9 0.9 1.0  AST 26  --  28 26 25   ALT 17  --  18 17 17   ALKPHOS 67  --  79 81 88  PROT 5.9*  --  5.5* 5.3* 6.7  ALBUMIN 2.7* 2.8* 2.6*  2.5* 2.4*  2.4* 3.4*    TUMOR MARKERS: No results for input(s): "AFPTM", "CEA", "CA199", "CHROMGRNA" in the last 8760 hours.  Assessment and Plan:  81 year old female doing well status post radiation segmentectomy of hepatocellular carcinoma in segment 7.  Most recent MR imaging demonstrates no evidence of recurrent or new disease.  She has now nearly 3 years disease-free.  We will continue routine surveillance.   Her ascites is better controlled now with diuretics.    1.)  Next follow-up MRI with gadolinium and accompanying clinic visit as well as liver labs in 6 months.     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    Visit type: Audio only (telephone). Audio (no video) only due to patient preference. Alternative for in-person consultation at [facility], [address], [place], [place]. This visit type was conducted due to national recommendations for restrictions regarding the COVID-19 Pandemic (e.g. social distancing).  This format is felt to be most appropriate for this patient at this time.  All issues noted in this document were discussed and addressed.

## 2021-11-29 ENCOUNTER — Inpatient Hospital Stay: Payer: Medicare Other

## 2021-11-29 ENCOUNTER — Inpatient Hospital Stay: Payer: Medicare Other | Admitting: Hematology

## 2022-01-08 ENCOUNTER — Other Ambulatory Visit: Payer: Self-pay | Admitting: *Deleted

## 2022-01-08 DIAGNOSIS — C220 Liver cell carcinoma: Secondary | ICD-10-CM

## 2022-01-09 ENCOUNTER — Telehealth: Payer: Self-pay | Admitting: Internal Medicine

## 2022-01-09 ENCOUNTER — Other Ambulatory Visit: Payer: Self-pay

## 2022-01-09 ENCOUNTER — Inpatient Hospital Stay: Payer: Medicare Other | Attending: Hematology

## 2022-01-09 ENCOUNTER — Inpatient Hospital Stay (HOSPITAL_BASED_OUTPATIENT_CLINIC_OR_DEPARTMENT_OTHER): Payer: Medicare Other | Admitting: Hematology

## 2022-01-09 VITALS — BP 126/64 | HR 67 | Temp 97.9°F | Resp 20 | Ht 64.5 in | Wt 150.9 lb

## 2022-01-09 DIAGNOSIS — Z8719 Personal history of other diseases of the digestive system: Secondary | ICD-10-CM | POA: Insufficient documentation

## 2022-01-09 DIAGNOSIS — E039 Hypothyroidism, unspecified: Secondary | ICD-10-CM | POA: Insufficient documentation

## 2022-01-09 DIAGNOSIS — K7460 Unspecified cirrhosis of liver: Secondary | ICD-10-CM | POA: Insufficient documentation

## 2022-01-09 DIAGNOSIS — Z7984 Long term (current) use of oral hypoglycemic drugs: Secondary | ICD-10-CM | POA: Insufficient documentation

## 2022-01-09 DIAGNOSIS — K7469 Other cirrhosis of liver: Secondary | ICD-10-CM | POA: Diagnosis not present

## 2022-01-09 DIAGNOSIS — Z79899 Other long term (current) drug therapy: Secondary | ICD-10-CM | POA: Insufficient documentation

## 2022-01-09 DIAGNOSIS — I10 Essential (primary) hypertension: Secondary | ICD-10-CM | POA: Diagnosis not present

## 2022-01-09 DIAGNOSIS — D696 Thrombocytopenia, unspecified: Secondary | ICD-10-CM | POA: Diagnosis not present

## 2022-01-09 DIAGNOSIS — K7581 Nonalcoholic steatohepatitis (NASH): Secondary | ICD-10-CM | POA: Diagnosis not present

## 2022-01-09 DIAGNOSIS — K766 Portal hypertension: Secondary | ICD-10-CM | POA: Insufficient documentation

## 2022-01-09 DIAGNOSIS — Z8505 Personal history of malignant neoplasm of liver: Secondary | ICD-10-CM | POA: Diagnosis not present

## 2022-01-09 DIAGNOSIS — E1136 Type 2 diabetes mellitus with diabetic cataract: Secondary | ICD-10-CM | POA: Diagnosis not present

## 2022-01-09 DIAGNOSIS — C220 Liver cell carcinoma: Secondary | ICD-10-CM

## 2022-01-09 LAB — CMP (CANCER CENTER ONLY)
ALT: 16 U/L (ref 0–44)
AST: 25 U/L (ref 15–41)
Albumin: 3.9 g/dL (ref 3.5–5.0)
Alkaline Phosphatase: 93 U/L (ref 38–126)
Anion gap: 4 — ABNORMAL LOW (ref 5–15)
BUN: 18 mg/dL (ref 8–23)
CO2: 26 mmol/L (ref 22–32)
Calcium: 9.5 mg/dL (ref 8.9–10.3)
Chloride: 103 mmol/L (ref 98–111)
Creatinine: 0.76 mg/dL (ref 0.44–1.00)
GFR, Estimated: >60 mL/min (ref >60)
Glucose, Bld: 87 mg/dL (ref 70–99)
Potassium: 4.1 mmol/L (ref 3.5–5.1)
Sodium: 133 mmol/L — ABNORMAL LOW (ref 135–145)
Total Bilirubin: 0.6 mg/dL (ref 0.3–1.2)
Total Protein: 6.8 g/dL (ref 6.5–8.1)

## 2022-01-09 LAB — CBC WITH DIFFERENTIAL (CANCER CENTER ONLY)
Abs Immature Granulocytes: 0.01 10*3/uL (ref 0.00–0.07)
Basophils Absolute: 0 10*3/uL (ref 0.0–0.1)
Basophils Relative: 1 %
Eosinophils Absolute: 0.1 10*3/uL (ref 0.0–0.5)
Eosinophils Relative: 4 %
HCT: 34.6 % — ABNORMAL LOW (ref 36.0–46.0)
Hemoglobin: 12.2 g/dL (ref 12.0–15.0)
Immature Granulocytes: 0 %
Lymphocytes Relative: 18 %
Lymphs Abs: 0.6 10*3/uL — ABNORMAL LOW (ref 0.7–4.0)
MCH: 31.2 pg (ref 26.0–34.0)
MCHC: 35.3 g/dL (ref 30.0–36.0)
MCV: 88.5 fL (ref 80.0–100.0)
Monocytes Absolute: 0.3 10*3/uL (ref 0.1–1.0)
Monocytes Relative: 10 %
Neutro Abs: 2.2 10*3/uL (ref 1.7–7.7)
Neutrophils Relative %: 67 %
Platelet Count: 113 10*3/uL — ABNORMAL LOW (ref 150–400)
RBC: 3.91 MIL/uL (ref 3.87–5.11)
RDW: 13.7 % (ref 11.5–15.5)
WBC Count: 3.3 10*3/uL — ABNORMAL LOW (ref 4.0–10.5)
nRBC: 0 % (ref 0.0–0.2)

## 2022-01-09 NOTE — Telephone Encounter (Signed)
She might considering transfer of care outside of this practice (since she is interested in another opinion).  She should ask her PCP to assist in this regard. If she decides to stay with our practice, I am happy to continue to see her. Thanks, Dr. Henrene Pastor

## 2022-01-09 NOTE — Telephone Encounter (Signed)
Good afternoon Dr. Henrene Pastor, patient called and is requesting a transfer of care for a different opinion. Patient did not have preference for provider. Sending to supervising MD for today. (Dr. Tarri Glenn) Are you okay with patient transferring?    Please advise, thank you.

## 2022-01-10 LAB — AFP TUMOR MARKER: AFP, Serum, Tumor Marker: 3.1 ng/mL (ref 0.0–8.7)

## 2022-01-14 NOTE — Progress Notes (Signed)
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 Laurence Ferrari) s/p Y90 rx with good response  Current Treatment: observation and further evaluation   INTERVAL HISTORY:   Crystal Barnett is here for follow-up regarding her thrombocytopenia and regarding her [place].. The patient's last visit with [place] was on 11/30/2020. She reports She is doing well with no new symptoms or concerns.  She notes having issues with recurrent symptomatic ascites and has needed recurrent therapeutic paracentesis.  No overt GI bleeding or other bleeding issues. No other new or acute focal symptoms.  Labs done today were reviewed in detail.  We discussed MRI abd w contrast 11/16/2021.  REVIEW OF SYSTEMS:   10 Point review of Systems was done is negative except as noted above. . Past Medical History:  Diagnosis Date   ALLERGIC RHINITIS 01/12/2008   Allergy    Cancer ([place])    liver cancer hepatocellular carcinoma per pt    Cataract    bilateral   Colon polyps    hyperplastic   Constipation    ongoing issues per pt   Diabetes ([place])    Esophageal varices ([place])    Fatty liver    GOITER, MULTINODULAR 01/12/2008   Hepatic cirrhosis ([place])    History of radiation therapy 11/20/2018   x 1 radiation for liver cancer   Hypertension    under control   HYPOTHYROIDISM 08/03/5571   Lichen planus    In the mouth, Notes that she's had this for at least 20 years   Liver cirrhosis secondary to NASH ([place])    Osteoporosis    Portal venous hypertension ([place])    Splenomegaly    Thrombocytopenia ([place])    Appears chronic from at least 2014    Vitamin D deficiency     . Past Surgical History:  Procedure Laterality Date   arthroscopic left knee  2005   BOWEL RESECTION N/A 09/12/2021   Procedure: SMALL BOWEL RESECTION;  Surgeon: Felicie Morn, MD;  Location: [place];  Service: General;  Laterality: N/A;   BREAST BIOPSY Left 2017   BREAST EXCISIONAL BIOPSY Right 2008   BREAST SURGERY  2008   Breast Biopsy    CHOLECYSTECTOMY  1985   COLONOSCOPY     IR ANGIOGRAM SELECTIVE EACH ADDITIONAL VESSEL  11/03/2018   IR ANGIOGRAM SELECTIVE EACH ADDITIONAL VESSEL  11/03/2018   IR ANGIOGRAM SELECTIVE EACH ADDITIONAL VESSEL  11/03/2018   IR ANGIOGRAM SELECTIVE EACH ADDITIONAL VESSEL  11/03/2018   IR ANGIOGRAM SELECTIVE EACH ADDITIONAL VESSEL  11/20/2018   IR ANGIOGRAM SELECTIVE EACH ADDITIONAL VESSEL  11/20/2018   IR ANGIOGRAM SELECTIVE EACH ADDITIONAL VESSEL  11/20/2018   IR ANGIOGRAM VISCERAL SELECTIVE  11/03/2018   IR ANGIOGRAM VISCERAL SELECTIVE  11/03/2018   IR ANGIOGRAM VISCERAL SELECTIVE  11/03/2018   IR ANGIOGRAM VISCERAL SELECTIVE  11/20/2018   IR ANGIOGRAM VISCERAL SELECTIVE  11/20/2018   IR EMBO TUMOR ORGAN ISCHEMIA INFARCT INC GUIDE ROADMAPPING  11/20/2018   IR GENERIC HISTORICAL  04/02/2016   IR RADIOLOGIST EVAL & MGMT 04/02/2016 Jacqulynn Cadet, MD [facility] INTERV RAD   IR PARACENTESIS  02/02/2018   IR PARACENTESIS  04/20/2018   IR PARACENTESIS  12/03/2018   IR PARACENTESIS  07/15/2019   IR PARACENTESIS  02/25/2020   IR PARACENTESIS  06/22/2020   IR PARACENTESIS  08/08/2020   IR PARACENTESIS  09/18/2020   IR PARACENTESIS  12/06/2020   IR PARACENTESIS  03/23/2021   IR PARACENTESIS  09/05/2021   IR PARACENTESIS  09/10/2021   IR RADIOLOGIST EVAL & MGMT  10/29/2016   IR RADIOLOGIST EVAL & MGMT  04/29/2017   IR RADIOLOGIST EVAL & MGMT  11/04/2017   IR RADIOLOGIST EVAL & MGMT  04/16/2018   IR RADIOLOGIST EVAL & MGMT  12/08/2018   IR RADIOLOGIST EVAL & MGMT  03/16/2019   IR RADIOLOGIST EVAL & MGMT  07/08/2019   IR RADIOLOGIST EVAL & MGMT   10/07/2019   IR RADIOLOGIST EVAL & MGMT  01/05/2020   IR RADIOLOGIST EVAL & MGMT  03/23/2020   IR RADIOLOGIST EVAL & MGMT  09/26/2020   IR RADIOLOGIST EVAL & MGMT  04/11/2021   IR RADIOLOGIST EVAL & MGMT  11/20/2021   IR US GUIDE VASC ACCESS LEFT  11/20/2018   IR US GUIDE VASC ACCESS RIGHT  11/03/2018   POLYPECTOMY     UMBILICAL HERNIA REPAIR N/A 09/12/2021   Procedure: UMBILICAL HERNIA REPAIR;  Surgeon: Felicie Morn, MD;  Location: [facility];  Service: General;  Laterality: N/A;   UPPER GASTROINTESTINAL ENDOSCOPY      Social History   Tobacco Use   Smoking status: Former    Types: Cigarettes    Quit date: 05/13/1990    Years since quitting: 31.6   Smokeless tobacco: Never  Vaping Use   Vaping Use: Never used  Substance Use Topics   Alcohol use: No    Alcohol/week: 0.0 standard drinks of alcohol   Drug use: No    ALLERGIES:  has No Known Allergies.   MEDICATIONS:  Current Outpatient Medications  Medication Sig Dispense Refill   Cholecalciferol (VITAMIN D) 50 MCG (2000 UT) CAPS Take 2,000 Units by mouth at bedtime.     docusate sodium (COLACE) 100 MG capsule Take 1 capsule (100 mg total) by mouth 2 (two) times daily. 10 capsule 0   ferrous sulfate 325 (65 FE) MG tablet Take 1 tablet (325 mg total) by mouth daily with breakfast.  3   fluticasone (FLONASE) 50 MCG/ACT nasal spray Place 1 spray into both nostrils daily.     furosemide (LASIX) 40 MG tablet Take 1 tablet (40 mg total) by mouth daily. 90 tablet 3   levothyroxine (SYNTHROID) 50 MCG tablet Take 1 tablet (50 mcg total) by mouth daily. 90 tablet 1   metFORMIN (GLUCOPHAGE) 500 MG tablet Take 500 mg by mouth every evening. Take 1 tablet by mouth once daily     montelukast (SINGULAIR) 10 MG tablet Take 10 mg by mouth at bedtime.      Multiple Vitamin (MULTIVITAMIN) tablet Take 1 tablet by mouth at bedtime.      pantoprazole (PROTONIX) 40 MG tablet Take 1 tablet (40 mg total) by mouth 2 (two) times daily. 180 tablet 3    polyethylene glycol (MIRALAX / GLYCOLAX) 17 g packet Take 17 g by mouth 2 (two) times daily. 14 each 0   spironolactone (ALDACTONE) 100 MG tablet Take 2 tablets (200 mg total) by mouth daily. 180 tablet 3   vitamin C (ASCORBIC ACID) 500 MG tablet Take 500 mg by mouth daily.     No current facility-administered medications for this visit.    PHYSICAL EXAMINATION:  ECOG PERFORMANCE STATUS: 1 - Symptomatic but completely ambulatory  Vitals:   01/09/22 1217  BP: 126/64  Pulse: 67  Resp: 20  Temp: 97.9 F (36.6 C)  SpO2: 99%    Filed Weights   01/09/22 1217  Weight: 150 lb 14.4 oz (68.4 kg)    .Body mass index is 25.5 kg/m.  NAD GENERAL:alert, in no acute distress and comfortable SKIN: no acute rashes, no significant lesions EYES: conjunctiva are pink and non-injected, sclera anicteric NECK: supple, no JVD LYMPH:  no palpable lymphadenopathy in the cervical, axillary or inguinal regions LUNGS: clear to auscultation b/l with normal respiratory effort HEART: regular rate & rhythm ABDOMEN:  normoactive bowel sounds , non tender. Extremity: 1+b/l pedal edema PSYCH: alert & oriented x 3 with fluent speech NEURO: no focal motor/sensory deficits  LABORATORY DATA:  I have reviewed the data as listed  .    Latest Ref Rng & Units 01/09/2022   11:56 AM 11/15/2021   10:47 AM 09/17/2021    1:32 AM  CBC  WBC 4.0 - 10.5 K/uL 3.3  3.1  5.6   Hemoglobin 12.0 - 15.0 g/dL 12.2  12.2  10.2   Hematocrit 36.0 - 46.0 % 34.6  36.8  28.8   Platelets 150 - 400 K/uL 113  125  139        Latest Ref Rng & Units 01/09/2022   11:56 AM 11/15/2021   10:47 AM 09/17/2021    1:32 AM  CMP  Glucose 70 - 99 mg/dL 87  89  88   BUN 8 - 23 mg/dL 18  17  21    Creatinine 0.44 - 1.00 mg/dL 0.76  0.76  0.74   Sodium 135 - 145 mmol/L 133  135  130   Potassium 3.5 - 5.1 mmol/L 4.1  4.8  4.5   Chloride 98 - 111 mmol/L 103  105  97   CO2 22 - 32 mmol/L 26  24  26    Calcium 8.9 - 10.3 mg/dL 9.5  8.9  8.3   Total  Protein 6.5 - 8.1 g/dL 6.8  6.7  5.3   Total Bilirubin 0.3 - 1.2 mg/dL 0.6  1.0  0.9   Alkaline Phos 38 - 126 U/L 93  88  81   AST 15 - 41 U/L 25  25  26    ALT 0 - 44 U/L 16  17  17     AFP tumor marker 3.7  RADIOLOGY  MRI Abdomen W WO Contrast 10/28/17  IMPRESSION: 1.2 cm hypervascular mass in the dome of the right hepatic lobe shows no significant change, however Imaging findings are diagnostic of hepatocellular carcinoma in the setting of cirrhosis. No evidence of abdominal metastatic disease. Hepatic cirrhosis, and mild splenomegaly consistent with portal venous hypertension. Moderate ascites, increased since prior study.   MRI abd w and wo contrast 04/23/17 stable appearance of segment 7 liver lesion. Highly suspicious for hepatocellular carcinoma. Cirrhosis, mild hepatic steatosis and splenomegaly.   MRI abd w and wo contrast 10/22/2016 CLINICAL DATA:  Followup of liver lesions. Nonalcoholic steatohepatitis induced cirrhosis. Thrombocytopenia. EXAM: MRI ABDOMEN WITHOUT AND WITH CONTRAST TECHNIQUE: Multiplanar multisequence MR imaging of the abdomen was performed both before and after the administration of intravenous contrast. CONTRAST:  72m EOVIST GADOXETATE DISODIUM 0.25 MOL/L IV SOLN COMPARISON:  MRI of 02/27/2016.  Clinic note of 04/02/2016. FINDINGS: Motion degradation involves the arterial phase post-contrast images primarily. Lower chest: Normal heart size without pericardial or pleural effusion. Hepatobiliary: Moderate to  marked cirrhosis. Segment 7 liver lesion has undergone mild interval enlargement. For example, on T2 weighted imaging, measures 12 x 10 mm (image 13/series 7) versus 10 x 9 mm on the prior. On post-contrast image 27/ series 502, measures 17 x 13 mm today versus 14 x 10 mm on the prior (when remeasured). Demonstrates arterial and portal venous phase hyper enhancement with relative isointensity on more delayed post-contrast imaging. Left  hepatic lobe tiny lesion is likely a cyst and is similar. Mild hepatic steatosis. Cholecystectomy, without biliary ductal dilatation. Pancreas:  Normal, without mass or ductal dilatation. Spleen:  Mild splenomegaly, 14.3 cm craniocaudal. Adrenals/Urinary Tract: Normal adrenal glands. Normal kidneys, without hydronephrosis. Stomach/Bowel: Normal stomach and abdominal bowel loops. Vascular/Lymphatic: Patent hepatic and portal veins. No specific evidence of portal venous hypertension. Circumaortic left renal vein. No retroperitoneal or retrocrural adenopathy. Other:  Small volume perihepatic ascites is unchanged Musculoskeletal: No acute osseous abnormality. IMPRESSION: 1. Mild interval enlargement of a segment 7 liver lesion, highly suspicious for hepatocellular carcinoma. 2. Cirrhosis, mild hepatic steatosis, and mild splenomegaly. Electronically Signed   By: Abigail Miyamoto M.D.   On: 10/22/2016 10:22     ASSESSMENT & PLAN:   81 y.o. female with  #1 Chronic thrombocytopenia. Platelet counts stable at 107k Likely related to cirrhosis related to NASH with mild splenomegaly.  B12, TSH, LDH within normal limits. MRI abd shows mild splenomegaly at 14.5 cm  #2 liver cirrhosis likely related to NASH hepatitis profile neg, ferritin unrevealing. Recent EGD showed no evidence of varices. Subtle changes of portal hypertensive gastropathy.  #3 Lesion in the RIGHT hepatic lobe has imaging characteristics highly suspicious for hepatic cellular carcinoma including arterial enhancement and a new peripheral rim.   04/23/17 MRI results do not indicate a significant change in the liver nodule   -Rpt MRI from 10/28/17 shows overall stable liver lesion at 1.2cm, mild splenomegaly, moderate ascites, liver cirrhosis.   04/16/18 MRI Abdomen which revealed There has been mild increase in size of hypervascular liver lesion within posterior dome. Enhancement characteristics are compatible with LR-5  (definitely [place]). No new lesions identified. 2. Morphologic features of liver compatible with cirrhosis with stigmata of portal venous hypertension.  03/11/2019:MRI abd w and w/o contrast  IMPRESSION: 1. Treated segment 7 right liver mass is decreased in size and demonstrates no internal solid enhancement to suggest residual or recurrent viability. Geographic signal intensity changes at the liver dome surrounding the treated lesion, favor post treatment change. No new liver masses. Continued MRI surveillance recommended. 2. Cirrhosis. Small to moderate volume ascites. Mild splenomegaly. Small gastroesophageal and paraumbilical varices.   PLAN: -Discussed pt labwork today, 01/09/2022; plt stable at 113k, normal hgb -MRI 11/16/2021- no evidence of HCC progression -No lab, clinical, or radiographic evidence of Hepatic Cellular Carcinoma recurrence at this time.  -Recommend pt f/u with Dr. Laurence Ferrari and Dr. Henrene Pastor as scheduled -Continue Lasix as prescribed -Due to stability of labs will see back in 12 months with labs    FOLLOW UP: RTC with Dr Irene Limbo with labs in 12 months   .The total time spent in the appointment was 20 minutes* .  All of the patient's questions were answered with apparent satisfaction. The patient knows to call the clinic with any problems, questions or concerns.   Sullivan Lone MD MS AAHIVMS [facility] Hematology/Oncology Physician [facility]  .*Total Encounter Time as defined by the Centers for Medicare and Medicaid Services includes, in addition to the face-to-face time of a patient  visit (documented in the note above) non-face-to-face time: obtaining and reviewing outside history, ordering and reviewing medications, tests or procedures, care coordination (communications with other health care professionals or caregivers) and documentation in the medical record.

## 2022-02-13 DIAGNOSIS — M810 Age-related osteoporosis without current pathological fracture: Secondary | ICD-10-CM | POA: Diagnosis not present

## 2022-02-13 DIAGNOSIS — Z23 Encounter for immunization: Secondary | ICD-10-CM | POA: Diagnosis not present

## 2022-02-13 DIAGNOSIS — K429 Umbilical hernia without obstruction or gangrene: Secondary | ICD-10-CM | POA: Diagnosis not present

## 2022-02-13 DIAGNOSIS — Z6827 Body mass index (BMI) 27.0-27.9, adult: Secondary | ICD-10-CM | POA: Diagnosis not present

## 2022-02-14 ENCOUNTER — Other Ambulatory Visit: Payer: Self-pay | Admitting: Family Medicine

## 2022-02-14 DIAGNOSIS — M810 Age-related osteoporosis without current pathological fracture: Secondary | ICD-10-CM

## 2022-02-26 DIAGNOSIS — E119 Type 2 diabetes mellitus without complications: Secondary | ICD-10-CM | POA: Diagnosis not present

## 2022-02-26 DIAGNOSIS — Z7984 Long term (current) use of oral hypoglycemic drugs: Secondary | ICD-10-CM | POA: Diagnosis not present

## 2022-02-26 DIAGNOSIS — Z961 Presence of intraocular lens: Secondary | ICD-10-CM | POA: Diagnosis not present

## 2022-03-01 ENCOUNTER — Other Ambulatory Visit: Payer: Self-pay | Admitting: Family Medicine

## 2022-03-01 DIAGNOSIS — Z1231 Encounter for screening mammogram for malignant neoplasm of breast: Secondary | ICD-10-CM

## 2022-03-01 DIAGNOSIS — M810 Age-related osteoporosis without current pathological fracture: Secondary | ICD-10-CM | POA: Diagnosis not present

## 2022-03-01 DIAGNOSIS — M8588 Other specified disorders of bone density and structure, other site: Secondary | ICD-10-CM | POA: Diagnosis not present

## 2022-03-01 DIAGNOSIS — Z780 Asymptomatic menopausal state: Secondary | ICD-10-CM | POA: Diagnosis not present

## 2022-03-14 DIAGNOSIS — Z23 Encounter for immunization: Secondary | ICD-10-CM | POA: Diagnosis not present

## 2022-03-14 DIAGNOSIS — M810 Age-related osteoporosis without current pathological fracture: Secondary | ICD-10-CM | POA: Diagnosis not present

## 2022-03-15 ENCOUNTER — Ambulatory Visit: Payer: Medicare Other | Admitting: Internal Medicine

## 2022-03-20 DIAGNOSIS — K432 Incisional hernia without obstruction or gangrene: Secondary | ICD-10-CM | POA: Diagnosis not present

## 2022-03-20 DIAGNOSIS — Z9889 Other specified postprocedural states: Secondary | ICD-10-CM | POA: Diagnosis not present

## 2022-04-23 ENCOUNTER — Ambulatory Visit: Payer: Medicare Other

## 2022-05-08 ENCOUNTER — Other Ambulatory Visit: Payer: Self-pay | Admitting: Interventional Radiology

## 2022-05-08 DIAGNOSIS — C220 Liver cell carcinoma: Secondary | ICD-10-CM

## 2022-05-17 ENCOUNTER — Other Ambulatory Visit: Payer: Self-pay | Admitting: Internal Medicine

## 2022-05-17 ENCOUNTER — Other Ambulatory Visit (HOSPITAL_COMMUNITY)
Admission: RE | Admit: 2022-05-17 | Discharge: 2022-05-17 | Disposition: A | Discharge status: Home / Self Care | Payer: Medicare Other | Source: Ambulatory Visit | Attending: Interventional Radiology | Admitting: Interventional Radiology

## 2022-05-17 DIAGNOSIS — C229 Malignant neoplasm of liver, not specified as primary or secondary: Secondary | ICD-10-CM | POA: Insufficient documentation

## 2022-05-17 DIAGNOSIS — K7460 Unspecified cirrhosis of liver: Secondary | ICD-10-CM | POA: Diagnosis not present

## 2022-05-17 DIAGNOSIS — R160 Hepatomegaly, not elsewhere classified: Secondary | ICD-10-CM | POA: Diagnosis not present

## 2022-05-17 DIAGNOSIS — Z87898 Personal history of other specified conditions: Secondary | ICD-10-CM | POA: Diagnosis not present

## 2022-05-17 LAB — CBC
HCT: 37.4 % (ref 36.0–46.0)
Hemoglobin: 12.4 g/dL (ref 12.0–15.0)
MCH: 31 pg (ref 26.0–34.0)
MCHC: 33.2 g/dL (ref 30.0–36.0)
MCV: 93.5 fL (ref 80.0–100.0)
Platelets: 131 10*3/uL — ABNORMAL LOW (ref 150–400)
RBC: 4 MIL/uL (ref 3.87–5.11)
RDW: 13 % (ref 11.5–15.5)
WBC: 3.2 10*3/uL — ABNORMAL LOW (ref 4.0–10.5)
nRBC: 0 % (ref 0.0–0.2)

## 2022-05-17 LAB — COMPREHENSIVE METABOLIC PANEL
ALT: 23 U/L (ref 0–44)
AST: 33 U/L (ref 15–41)
Albumin: 3.9 g/dL (ref 3.5–5.0)
Alkaline Phosphatase: 88 U/L (ref 38–126)
Anion gap: 9 (ref 5–15)
BUN: 25 mg/dL — ABNORMAL HIGH (ref 8–23)
CO2: 26 mmol/L (ref 22–32)
Calcium: 9.2 mg/dL (ref 8.9–10.3)
Chloride: 101 mmol/L (ref 98–111)
Creatinine, Ser: 0.89 mg/dL (ref 0.44–1.00)
GFR, Estimated: >60 mL/min (ref >60)
Glucose, Bld: 91 mg/dL (ref 70–99)
Potassium: 4 mmol/L (ref 3.5–5.1)
Sodium: 136 mmol/L (ref 135–145)
Total Bilirubin: 0.7 mg/dL (ref 0.3–1.2)
Total Protein: 7.5 g/dL (ref 6.5–8.1)

## 2022-05-17 LAB — PROTIME-INR
INR: 1.1 (ref 0.8–1.2)
Prothrombin Time: 13.9 seconds (ref 11.4–15.2)

## 2022-05-18 LAB — AFP TUMOR MARKER: AFP, Serum, Tumor Marker: 2.7 ng/mL (ref 0.0–8.7)

## 2022-05-23 ENCOUNTER — Ambulatory Visit: Payer: Medicare Other | Admitting: Endocrinology

## 2022-05-30 ENCOUNTER — Ambulatory Visit (HOSPITAL_COMMUNITY)
Admission: RE | Admit: 2022-05-30 | Discharge: 2022-05-30 | Disposition: A | Discharge status: Home / Self Care | Payer: Medicare Other | Source: Ambulatory Visit | Attending: Interventional Radiology | Admitting: Interventional Radiology

## 2022-05-30 DIAGNOSIS — K769 Liver disease, unspecified: Secondary | ICD-10-CM | POA: Diagnosis not present

## 2022-05-30 DIAGNOSIS — I8500 Esophageal varices without bleeding: Secondary | ICD-10-CM | POA: Diagnosis not present

## 2022-05-30 DIAGNOSIS — C220 Liver cell carcinoma: Secondary | ICD-10-CM

## 2022-05-30 DIAGNOSIS — D696 Thrombocytopenia, unspecified: Secondary | ICD-10-CM | POA: Diagnosis not present

## 2022-05-30 DIAGNOSIS — K7460 Unspecified cirrhosis of liver: Secondary | ICD-10-CM | POA: Diagnosis not present

## 2022-05-30 MED ORDER — GADOBUTROL 1 MMOL/ML IV SOLN
7.0000 mL | Freq: Once | INTRAVENOUS | Status: AC | PRN
  Administered 2022-05-30: 7 mL via INTRAVENOUS

## 2022-06-04 ENCOUNTER — Ambulatory Visit
Admission: RE | Admit: 2022-06-04 | Discharge: 2022-06-04 | Disposition: A | Discharge status: Home / Self Care | Payer: Medicare Other | Source: Ambulatory Visit | Attending: Interventional Radiology | Admitting: Interventional Radiology

## 2022-06-04 DIAGNOSIS — R188 Other ascites: Secondary | ICD-10-CM | POA: Diagnosis not present

## 2022-06-04 DIAGNOSIS — K7581 Nonalcoholic steatohepatitis (NASH): Secondary | ICD-10-CM | POA: Diagnosis not present

## 2022-06-04 DIAGNOSIS — C220 Liver cell carcinoma: Secondary | ICD-10-CM

## 2022-06-04 NOTE — Progress Notes (Signed)
Chief Complaint: Patient was consulted remotely today ([place]) for [place] cirrhosis complicated by ascites and [place]  at the request of Luken Shadowens K.    Referring Physician(s): Dr. Irene Limbo  History of Present Illness: Crystal Barnett is a 82 y.o. 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 MRI imaging was performed first in December 2016 to evaluate a region of possible concern in the left hepatic lobe. On that study, there was a small 9 mm enhancing focus in hepatic segment 7 in the posterosuperior aspect of the hepatic dome. This subsequently been followed at 6 month intervals with repeat MRI scans of the abdomen.   MRI from 04/16/18 demonstrates more definitive enlargement of the lesion now measuring up to 1.9 cm.  Additionally, there appears to be an enhancing pseudocapsule.  On the present study, the imaging characteristics are consistent with a Li-RADS 5 lesion which is diagnostic of hepatocellular carcinoma.  The very slow growth rate over the past 3 years suggests a low-grade or indolent malignancy at this time.   Due to the presence of ascites and the location of the lesion high in the right dome just under the lung, we elected to proceed with transarterial radio embolization segmentectomy (7) which was performed on 11/17/2018.   Today, we are having our 69-monthpost procedure follow-up consultation via telemedicine.  Mrs. PWirthlinis doing well.  She denies fatigue, abdominal pain, nausea, vomiting or other clinical symptoms.     MRI 03/11/19: Treated segment 7 right liver mass is decreased in size and demonstrates no internal solid enhancement to suggest residual or recurrent viability. Geographic signal intensity changes at the liver dome surrounding the treated lesion, favor post treatment change. No new liver masses. Continued MRI surveillance recommended.   MRI 07/05/19: Stable appearance of treated segment 7 right liver lesion which  is decreased in size without internal enhancement. No new liver Lesions.   MRI 10/01/19: Unchanged ablation site of the liver dome, hepatic segment VII, without residual contrast enhancement. No evidence of viability or recurrence. LI-RADS 5T. No new suspicious liver lesions.   MRI  12/31/19: Similar size of ablation defect within the high right hepatic lobe, without findings of recurrent or metastatic disease.  Cirrhosis and portal venous hypertension, without new hepatocellular carcinoma.   MRI 03/20/20: At the site of the prior segment 7 hepatocellular carcinoma that was shown on 04/16/2018, there is a small nonenhancing 1.0 by 0.6 cm ablation site with no significant recurrent arterial phase enhancement to suggest active malignancy.   MRI 09/24/20: Stable small nonenhancing lesion at the hepatic dome in segment 7 consistent with treated disease. No findings suspicious for recurrent tumor and no new early arterial phase enhancing hepatic lesions to suggest hepatoma or dysplastic nodule.   MRI 04/02/21: Stable post treatment changes in the right lobe of the liver, with no new hypervascular lesion to suggest new or recurrent hepatocellular carcinoma.   MRI 11/16/21: Stable post treatment changes involving the right hepatic lobe in segment 7. No findings for dysplastic nodules or HCC.  MRI 05/30/22: Stable treated lesion in segment 7 of the liver with some surrounding fibrosis. Separate tiny lesion in segment 3 is relatively nonaggressive and stable from previous.   I spoke with Mrs. PMcclenneyover the telephone today.  She is doing well and has had no major events since our last clinical visit.  She is pleased to hear that her MRI results remain good.  She has no issues with  recurrent symptomatic ascites.  Past Medical History:  Diagnosis Date   ALLERGIC RHINITIS 01/12/2008   Allergy    Cancer ([place])    liver cancer hepatocellular carcinoma per pt    Cataract    bilateral   Colon polyps     hyperplastic   Constipation    ongoing issues per pt   Diabetes ([place])    Esophageal varices ([place])    Fatty liver    GOITER, MULTINODULAR 01/12/2008   Hepatic cirrhosis ([place])    History of radiation therapy 11/20/2018   x 1 radiation for liver cancer   Hypertension    under control   HYPOTHYROIDISM 0/96/2836   Lichen planus    In the mouth, Notes that she's had this for at least 20 years   Liver cirrhosis secondary to NASH ([place])    Osteoporosis    Portal venous hypertension ([place])    Splenomegaly    Thrombocytopenia ([place])    Appears chronic from at least 2014   Vitamin D deficiency     Past Surgical History:  Procedure Laterality Date   arthroscopic left knee  2005   BOWEL RESECTION N/A 09/12/2021   Procedure: SMALL BOWEL RESECTION;  Surgeon: Felicie Morn, MD;  Location: [facility];  Service: General;  Laterality: N/A;   BREAST BIOPSY Left 2017   BREAST EXCISIONAL BIOPSY Right 2008   BREAST SURGERY  2008   Breast Biopsy    CHOLECYSTECTOMY  1985   COLONOSCOPY     IR ANGIOGRAM SELECTIVE EACH ADDITIONAL VESSEL  11/03/2018   IR ANGIOGRAM SELECTIVE EACH ADDITIONAL VESSEL  11/03/2018   IR ANGIOGRAM SELECTIVE EACH ADDITIONAL VESSEL  11/03/2018   IR ANGIOGRAM SELECTIVE EACH ADDITIONAL VESSEL  11/03/2018   IR ANGIOGRAM SELECTIVE EACH ADDITIONAL VESSEL  11/20/2018   IR ANGIOGRAM SELECTIVE EACH ADDITIONAL VESSEL  11/20/2018   IR ANGIOGRAM SELECTIVE EACH ADDITIONAL VESSEL  11/20/2018   IR ANGIOGRAM VISCERAL SELECTIVE  11/03/2018   IR ANGIOGRAM VISCERAL SELECTIVE  11/03/2018   IR ANGIOGRAM VISCERAL SELECTIVE  11/03/2018   IR ANGIOGRAM VISCERAL SELECTIVE  11/20/2018   IR ANGIOGRAM VISCERAL SELECTIVE  11/20/2018   IR EMBO TUMOR ORGAN ISCHEMIA INFARCT INC GUIDE ROADMAPPING  11/20/2018   IR GENERIC HISTORICAL  04/02/2016   IR RADIOLOGIST EVAL & MGMT 04/02/2016 Jacqulynn Cadet, MD [facility] INTERV RAD   IR PARACENTESIS  02/02/2018   IR PARACENTESIS  04/20/2018   IR PARACENTESIS  12/03/2018   IR  PARACENTESIS  07/15/2019   IR PARACENTESIS  02/25/2020   IR PARACENTESIS  06/22/2020   IR PARACENTESIS  08/08/2020   IR PARACENTESIS  09/18/2020   IR PARACENTESIS  12/06/2020   IR PARACENTESIS  03/23/2021   IR PARACENTESIS  09/05/2021   IR PARACENTESIS  09/10/2021   IR RADIOLOGIST EVAL & MGMT  10/29/2016   IR RADIOLOGIST EVAL & MGMT  04/29/2017   IR RADIOLOGIST EVAL & MGMT  11/04/2017   IR RADIOLOGIST EVAL & MGMT  04/16/2018   IR RADIOLOGIST EVAL & MGMT  12/08/2018   IR RADIOLOGIST EVAL & MGMT  03/16/2019   IR RADIOLOGIST EVAL & MGMT  07/08/2019   IR RADIOLOGIST EVAL & MGMT  10/07/2019   IR RADIOLOGIST EVAL & MGMT  01/05/2020   IR RADIOLOGIST EVAL & MGMT  03/23/2020   IR RADIOLOGIST EVAL & MGMT  09/26/2020   IR RADIOLOGIST EVAL & MGMT  04/11/2021   IR RADIOLOGIST EVAL & MGMT  11/20/2021   IR US GUIDE VASC ACCESS LEFT  11/20/2018  IR US GUIDE VASC ACCESS RIGHT  11/03/2018   POLYPECTOMY     UMBILICAL HERNIA REPAIR N/A 09/12/2021   Procedure: UMBILICAL HERNIA REPAIR;  Surgeon: Felicie Morn, MD;  Location: [facility];  Service: General;  Laterality: N/A;   UPPER GASTROINTESTINAL ENDOSCOPY      Allergies: Patient has no known allergies.  Medications: Prior to Admission medications   Medication Sig Start Date End Date Taking? Authorizing Provider  Cholecalciferol (VITAMIN D) 50 MCG (2000 UT) CAPS Take 2,000 Units by mouth at bedtime.    [provider]  docusate sodium (COLACE) 100 MG capsule Take 1 capsule (100 mg total) by mouth 2 (two) times daily. 09/20/21   [place] Donnamarie Poag, DO  ferrous sulfate 325 (65 FE) MG tablet Take 1 tablet (325 mg total) by mouth daily with breakfast. 09/21/21   [place] Eric J, DO  fluticasone (FLONASE) 50 MCG/ACT nasal spray Place 1 spray into both nostrils daily. 08/21/21   [provider]  furosemide (LASIX) 40 MG tablet Take 1 tablet (40 mg total) by mouth daily. 06/12/21   Irene Shipper, MD  levothyroxine (SYNTHROID) 50 MCG tablet Take 1 tablet (50 mcg  total) by mouth daily. 09/04/21   Renato Shin, MD  metFORMIN (GLUCOPHAGE) 500 MG tablet Take 500 mg by mouth every evening. Take 1 tablet by mouth once daily    [provider]  montelukast (SINGULAIR) 10 MG tablet Take 10 mg by mouth at bedtime.  03/25/14   [provider]  Multiple Vitamin (MULTIVITAMIN) tablet Take 1 tablet by mouth at bedtime.     [provider]  pantoprazole (PROTONIX) 40 MG tablet Take 1 tablet (40 mg total) by mouth 2 (two) times daily. 06/12/21   Irene Shipper, MD  polyethylene glycol (MIRALAX / GLYCOLAX) 17 g packet Take 17 g by mouth 2 (two) times daily. 09/20/21   [place] Donnamarie Poag, DO  spironolactone (ALDACTONE) 100 MG tablet Take 2 tablets (200 mg total) by mouth daily. 06/12/21   Irene Shipper, MD  vitamin C (ASCORBIC ACID) 500 MG tablet Take 500 mg by mouth daily.    [provider]     Family History  Problem Relation Age of Onset   Breast cancer Mother        in 36's   Stroke Father 77   Goiter Maternal Grandmother    Colon cancer Neg Hx    Colon polyps Neg Hx    Esophageal cancer Neg Hx    Stomach cancer Neg Hx    Rectal cancer Neg Hx    Pancreatic cancer Neg Hx     Social History   Socioeconomic History   Marital status: Single    Spouse name: Not on file   Number of children: 1   Years of education: Not on file   Highest education level: Not on file  Occupational History   Occupation: retired    Comment: works in Chiropodist for [employer]  Tobacco Use   Smoking status: Former    Types: Cigarettes    Quit date: 05/13/1990    Years since quitting: 32.0   Smokeless tobacco: Never  Vaping Use   Vaping Use: Never used  Substance and Sexual Activity   Alcohol use: No    Alcohol/week: 0.0 standard drinks of alcohol   Drug use: No   Sexual activity: Not on file  Other Topics Concern   Not on file  Social History Narrative   Not on  file   Social Determinants of Health   Financial Resource  Strain: Low Risk  (11/07/2021)   Overall Financial Resource Strain (CARDIA)    Difficulty of Paying Living Expenses: Not hard at all  Food Insecurity: No Food Insecurity (11/07/2021)   Hunger Vital Sign    Worried About Running Out of Food in the Last Year: Never true    Ran Out of Food in the Last Year: Never true  Transportation Needs: No Transportation Needs (11/07/2021)   PRAPARE - Hydrologist (Medical): No    Lack of Transportation (Non-Medical): No  Physical Activity: Not on file  Stress: No Stress Concern Present (11/07/2021)   [place]    Feeling of Stress : Not at all  Social Connections: Not on file    ECOG Status: 0 - Asymptomatic  Review of Systems  Review of Systems: A 12 point ROS discussed and pertinent positives are indicated in the HPI above.  All other systems are negative.   Physical Exam No direct physical exam was performed (except for noted visual exam findings with Video Visits).    Vital Signs: There were no vitals taken for this visit.  Imaging: MR ABDOMEN WWO CONTRAST  Result Date: 05/30/2022 CLINICAL DATA:  Cirrhosis and thrombocytopenia. EXAM: MRI ABDOMEN WITHOUT AND WITH CONTRAST TECHNIQUE: Multiplanar multisequence MR imaging of the abdomen was performed both before and after the administration of intravenous contrast. CONTRAST:  54m GADAVIST GADOBUTROL 1 MMOL/ML IV SOLN COMPARISON:  MRI 11/16/2021. Older exams. CT 09/12/2021 and again older exams. FINDINGS: Lower chest: Lung bases are clear.  No pleural effusion. Hepatobiliary: There is a micronodular liver identified. On delay there are some areas of interstitial enhancement consistent with some component of fibrosis. Portal vein has a diameter of 14 mm at the liver hilum and is patent. In the dome on the right side in segment 7 is a small lesion with an enhancing penumbra. Faintly bright on precontrast T2.  Relatively dark on T1 but not as well seen on precontrast T1. Central nonenhancing component with only some marginal enhancement on delays. The central portion measures 10 by 5 mm on series 19, image 15. No associated restricted diffusion. Appearance is very similar to the previous examination and again consistent with treated lesion with fibrosis. No new hypervascular liver lesion seen. No areas of abnormal washout. There is a small nonenhancing focus as well seen in segment 3 which on series 14, image 43 measures 5 mm. This is not well seen on T2 and is unchanged from the prior. Presumed prior cholecystectomy Pancreas: Global atrophy of the pancreas without dilated duct or enhancing mass. Punctate cystic focus in the midbody on series 4, image 22 measuring 3 mm. Based on size and appearance no specific imaging follow-up. Spleen: Cephalocaudal length of the spleen of 14.1 cm. Mildly enlarged. Preserved enhancement. Adrenals/Urinary Tract: Adrenal glands are preserved. Preserved enhancement of the kidneys. Stomach/Bowel: Stomach is underdistended. The visualized small and large bowel are nondilated. Scattered colonic stool. Vascular/Lymphatic: Normal caliber aorta and IVC. Circumaortic left renal vein. There are scattered varices in the upper abdomen including along the lower esophagus and upper stomach. Again the portal vein, splenic vein and SMV are patent. No specific abnormal lymph node enlargement seen in the abdomen. Other:  Mild ascites. Musculoskeletal: Mild degenerative changes along the spine. IMPRESSION: Again evidence of chronic liver disease with nodular liver, splenomegaly, varices and ascites. The portal vein is patent.  Stable treated lesion in segment 7 of the liver with some surrounding fibrosis. Separate tiny lesion in segment 3 is relatively nonaggressive and stable from previous. Again simple attention on follow-up. New hypervascular liver lesions. Previous cholecystectomy Electronically Signed    By: Jill Side M.D.   On: 05/30/2022 13:16    Labs:  CBC: Recent Labs    09/17/21 0132 11/15/21 1047 01/09/22 1156 05/17/22 1222  WBC 5.6 3.1* 3.3* 3.2*  HGB 10.2* 12.2 12.2 12.4  HCT 28.8* 36.8 34.6* 37.4  PLT 139* 125* 113* 131*    COAGS: Recent Labs    09/12/21 0432 11/15/21 1047 05/17/22 1222  INR 1.2 1.1 1.1    BMP: Recent Labs    09/17/21 0132 11/15/21 1047 01/09/22 1156 05/17/22 1222  NA 130* 135 133* 136  K 4.5 4.8 4.1 4.0  CL 97* 105 103 101  CO2 '26 24 26 26  '$ GLUCOSE 88 89 87 91  BUN '21 17 18 '$ 25*  CALCIUM 8.3* 8.9 9.5 9.2  CREATININE 0.74 0.76 0.76 0.89  GFRNONAA >60 >60 >60 >60    LIVER FUNCTION TESTS: Recent Labs    09/17/21 0132 11/15/21 1047 01/09/22 1156 05/17/22 1222  BILITOT 0.9 1.0 0.6 0.7  AST '26 25 25 '$ 33  ALT '17 17 16 23  '$ ALKPHOS 81 88 93 88  PROT 5.3* 6.7 6.8 7.5  ALBUMIN 2.4*  2.4* 3.4* 3.9 3.9    TUMOR MARKERS: No results for input(s): "AFPTM", "CEA", "CA199", "CHROMGRNA" in the last 8760 hours.  Assessment and Plan:  82 year old female doing well status post radiation segmentectomy of hepatocellular carcinoma in segment 7. Most recent MR imaging demonstrates no evidence of recurrent or new disease.  She has now nearly 4 years disease-free.  We will continue routine surveillance.   Her ascites is better controlled now with diuretics.    1.)  Next follow-up MRI with gadolinium and accompanying clinic visit 6 months.    Electronically Signed: Criselda Peaches 06/04/2022, 12:09 PM   I spent a total of 15 Minutes in remote  clinical consultation, greater than 50% of which was counseling/coordinating care for NASH cirrhosis complicated by ascites and HCC .    Visit type: Audio only (telephone). Audio (no video) only due to patient preference. Alternative for in-person consultation at [facility], [address] [place], [place], [place]. This visit type was conducted due to national recommendations for  restrictions regarding the COVID-19 Pandemic (e.g. social distancing).  This format is felt to be most appropriate for this patient at this time.  All issues noted in this document were discussed and addressed.

## 2022-07-02 ENCOUNTER — Encounter (HOSPITAL_COMMUNITY): Payer: Self-pay | Admitting: Internal Medicine

## 2022-07-02 NOTE — Progress Notes (Signed)
Attempted to obtain medical history via telephone, unable to reach at this time. HIPAA compliant voicemail message left requesting return call to pre surgical testing department. 

## 2022-07-09 ENCOUNTER — Other Ambulatory Visit: Payer: Self-pay

## 2022-07-09 ENCOUNTER — Encounter (HOSPITAL_COMMUNITY): Payer: Self-pay | Admitting: Internal Medicine

## 2022-07-09 ENCOUNTER — Ambulatory Visit (HOSPITAL_BASED_OUTPATIENT_CLINIC_OR_DEPARTMENT_OTHER): Payer: Medicare Other | Admitting: Certified Registered"

## 2022-07-09 ENCOUNTER — Ambulatory Visit (HOSPITAL_COMMUNITY): Payer: Medicare Other | Admitting: Certified Registered"

## 2022-07-09 ENCOUNTER — Ambulatory Visit (HOSPITAL_COMMUNITY)
Admission: RE | Admit: 2022-07-09 | Discharge: 2022-07-09 | Disposition: A | Discharge status: Home / Self Care | Payer: Medicare Other | Attending: Internal Medicine | Admitting: Internal Medicine

## 2022-07-09 ENCOUNTER — Encounter (HOSPITAL_COMMUNITY)
Admission: RE | Disposition: A | Discharge status: Home / Self Care | Payer: Self-pay | Source: Home / Self Care | Attending: Internal Medicine

## 2022-07-09 DIAGNOSIS — K31819 Angiodysplasia of stomach and duodenum without bleeding: Secondary | ICD-10-CM | POA: Insufficient documentation

## 2022-07-09 DIAGNOSIS — Z87891 Personal history of nicotine dependence: Secondary | ICD-10-CM | POA: Insufficient documentation

## 2022-07-09 DIAGNOSIS — Z923 Personal history of irradiation: Secondary | ICD-10-CM | POA: Diagnosis not present

## 2022-07-09 DIAGNOSIS — K7460 Unspecified cirrhosis of liver: Secondary | ICD-10-CM | POA: Insufficient documentation

## 2022-07-09 DIAGNOSIS — K2970 Gastritis, unspecified, without bleeding: Secondary | ICD-10-CM

## 2022-07-09 DIAGNOSIS — Z7984 Long term (current) use of oral hypoglycemic drugs: Secondary | ICD-10-CM | POA: Insufficient documentation

## 2022-07-09 DIAGNOSIS — I10 Essential (primary) hypertension: Secondary | ICD-10-CM

## 2022-07-09 DIAGNOSIS — E119 Type 2 diabetes mellitus without complications: Secondary | ICD-10-CM | POA: Insufficient documentation

## 2022-07-09 DIAGNOSIS — Z8505 Personal history of malignant neoplasm of liver: Secondary | ICD-10-CM | POA: Insufficient documentation

## 2022-07-09 DIAGNOSIS — I8510 Secondary esophageal varices without bleeding: Secondary | ICD-10-CM | POA: Diagnosis not present

## 2022-07-09 DIAGNOSIS — K219 Gastro-esophageal reflux disease without esophagitis: Secondary | ICD-10-CM | POA: Diagnosis not present

## 2022-07-09 DIAGNOSIS — Z8619 Personal history of other infectious and parasitic diseases: Secondary | ICD-10-CM | POA: Insufficient documentation

## 2022-07-09 DIAGNOSIS — K7581 Nonalcoholic steatohepatitis (NASH): Secondary | ICD-10-CM | POA: Insufficient documentation

## 2022-07-09 DIAGNOSIS — R933 Abnormal findings on diagnostic imaging of other parts of digestive tract: Secondary | ICD-10-CM | POA: Diagnosis not present

## 2022-07-09 HISTORY — PX: ESOPHAGOGASTRODUODENOSCOPY (EGD) WITH PROPOFOL: SHX5813

## 2022-07-09 HISTORY — PX: BIOPSY: SHX5522

## 2022-07-09 LAB — GLUCOSE, CAPILLARY
Glucose-Capillary: 62 mg/dL — ABNORMAL LOW (ref 70–99)
Glucose-Capillary: 127 mg/dL — ABNORMAL HIGH (ref 70–99)

## 2022-07-09 SURGERY — ESOPHAGOGASTRODUODENOSCOPY (EGD) WITH PROPOFOL
Anesthesia: Monitor Anesthesia Care

## 2022-07-09 MED ORDER — PROPOFOL 500 MG/50ML IV EMUL
INTRAVENOUS | Status: DC | PRN
  Administered 2022-07-09: 125 ug/kg/min via INTRAVENOUS

## 2022-07-09 MED ORDER — LACTATED RINGERS IV SOLN: INTRAVENOUS | Status: DC

## 2022-07-09 MED ORDER — DEXTROSE 50 % IV SOLN
12.5000 g | Freq: Once | INTRAVENOUS | Status: AC
  Administered 2022-07-09: 12.5 g via INTRAVENOUS

## 2022-07-09 MED ORDER — PROPOFOL 10 MG/ML IV BOLUS
INTRAVENOUS | Status: DC | PRN
  Administered 2022-07-09 (×2): 20 mg via INTRAVENOUS

## 2022-07-09 MED ORDER — SODIUM CHLORIDE 0.9 % IV SOLN: INTRAVENOUS | Status: DC

## 2022-07-09 MED ORDER — LIDOCAINE 2% (20 MG/ML) 5 ML SYRINGE
INTRAMUSCULAR | Status: DC | PRN
  Administered 2022-07-09: 80 mg via INTRAVENOUS

## 2022-07-09 MED ORDER — DEXTROSE 50 % IV SOLN: 25.0000 g | Freq: Once | INTRAVENOUS | Status: DC

## 2022-07-09 SURGICAL SUPPLY — 15 items

## 2022-07-09 NOTE — Anesthesia Preprocedure Evaluation (Addendum)
Anesthesia Evaluation  Patient identified by MRN, date of birth, ID band Patient awake    Reviewed: Allergy & Precautions, NPO status , Patient's Chart, lab work & pertinent test results  Airway Mallampati: III  TM Distance: >3 FB Neck ROM: Full    Dental no notable dental hx.    Pulmonary former smoker   Pulmonary exam normal        Cardiovascular hypertension, Normal cardiovascular exam     Neuro/Psych negative neurological ROS  negative psych ROS   GI/Hepatic ,GERD  Medicated and Controlled,,(+) Cirrhosis   Esophageal Varices and ascites    , Hepatitis -  Endo/Other  diabetes, Oral Hypoglycemic AgentsHypothyroidism    Renal/GU negative Renal ROS     Musculoskeletal negative musculoskeletal ROS (+)    Abdominal   Peds  Hematology negative hematology ROS (+)   Anesthesia Other Findings cirrhosis of the liver  Reproductive/Obstetrics                             Anesthesia Physical Anesthesia Plan  ASA: 3  Anesthesia Plan: MAC   Post-op Pain Management:    Induction: Intravenous  PONV Risk Score and Plan: 2 and Propofol infusion and Treatment may vary due to age or medical condition  Airway Management Planned: Nasal Cannula  Additional Equipment:   Intra-op Plan:   Post-operative Plan:   Informed Consent: I have reviewed the patients History and Physical, chart, labs and discussed the procedure including the risks, benefits and alternatives for the proposed anesthesia with the patient or authorized representative who has indicated his/her understanding and acceptance.     Dental advisory given  Plan Discussed with: CRNA  Anesthesia Plan Comments:        Anesthesia Quick Evaluation

## 2022-07-09 NOTE — Transfer of Care (Signed)
Immediate Anesthesia Transfer of Care Note  Patient: Crystal Barnett  Procedure(s) Performed: ESOPHAGOGASTRODUODENOSCOPY (EGD) WITH PROPOFOL BIOPSY  Patient Location: PACU  Anesthesia Type:MAC  Level of Consciousness: awake, alert , and oriented  Airway & Oxygen Therapy: Patient Spontanous Breathing and Patient connected to nasal cannula oxygen  Post-op Assessment: Report given to RN and Post -op Vital signs reviewed and stable  Post vital signs: Reviewed and stable  Last Vitals:  Vitals Value Taken Time  BP 116/60 07/09/22 1048  Temp    Pulse 29 07/09/22 1048  Resp 18 07/09/22 1050  SpO2 88 % 07/09/22 1048  Vitals shown include unvalidated device data.  Last Pain:  Vitals:   07/09/22 0944  TempSrc: Temporal  PainSc: 0-No pain         Complications: No notable events documented.

## 2022-07-09 NOTE — Anesthesia Postprocedure Evaluation (Signed)
Anesthesia Post Note  Patient: Crystal Barnett  Procedure(s) Performed: ESOPHAGOGASTRODUODENOSCOPY (EGD) WITH PROPOFOL BIOPSY     Patient location during evaluation: Endoscopy Anesthesia Type: MAC Level of consciousness: awake Pain management: pain level controlled Vital Signs Assessment: post-procedure vital signs reviewed and stable Respiratory status: spontaneous breathing, nonlabored ventilation and respiratory function stable Cardiovascular status: blood pressure returned to baseline and stable Postop Assessment: no apparent nausea or vomiting Anesthetic complications: no   No notable events documented.  Last Vitals:  Vitals:   07/09/22 1050 07/09/22 1100  BP: 100/62 123/72  Pulse: 68 63  Resp: 18 13  Temp:    SpO2: 98% 100%    Last Pain:  Vitals:   07/09/22 1100  TempSrc:   PainSc: 0-No pain                 Tanasha Menees P Zacharey Jensen

## 2022-07-09 NOTE — H&P (Signed)
Crystal Barnett is a 82 year old female with past medical history significant for Crystal Barnett cirrhosis decompensated by ascites on diuretic therapy.  Cirrhosis is complicated by hepatocellular carcinoma in segment 7 status post radiation segmentectomy, follows with interventional radiology and recently had MRI testing in January that was encouraging.  Last he was completed in November 2020 showing no varices.  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  Heart rate has been too low for beta-blocker therapy.  She was seen in office on 05/17/2022 and arrangements for EGD with possible variceal banding were made.  Today denied abdominal pain, nausea vomiting, chest pain or shortness of breath.  She noted that her bowel movements are normal.  EGD procedure was discussed with patient in detail including band ligation.  Discussed benefits, alternatives of beta-blocker therapy which she is not a candidate for, risks of procedure including bleeding/infection/perforation/pain after variceal ligation, anesthesia.  Physical exam: General: Awake and alert, nontoxic in appearance Cardio: Regular rate and rhythm Pulm: Clear to auscultation in anterior lung fields, no supplemental oxygen in place Abdomen: Soft, diffusely tender to palpation, bowel sounds appreciated  Assessment: -NASH cirrhosis -Abnormal MRI imaging, paraesophageal varices  Plan: -Proceed to EGD today with possible variceal banding  Crystal Clap, DO [facility]

## 2022-07-09 NOTE — Discharge Instructions (Signed)

## 2022-07-09 NOTE — Op Note (Addendum)
[facility] Patient Name: Crystal Barnett Procedure Date: 07/09/2022 MRN: [id number] Attending MD: Danton Clap DO, DO, [id number] Date of Birth: [birth year] CSN: [id number] Age: 82 Admit Type: Outpatient Procedure:                Upper GI endoscopy Indications:              Abnormal MRI of the GI tract, Cirrhosis with                            suspected esophageal varices Providers:                Danton Clap DO, DO, Jamison Neighbor RN, RN,                            Fransico Setters Mbumina, Technician Referring MD:              Medicines:                See the Anesthesia note for documentation of the                            administered medications Complications:            No immediate complications. Estimated Blood Loss:     Estimated blood loss was minimal. Procedure:                Pre-Anesthesia Assessment:                           - ASA Grade Assessment: III - A patient with severe                            systemic disease.                           - The risks and benefits of the procedure and the                            sedation options and risks were discussed with the                            patient. All questions were answered and informed                            consent was obtained.                           After obtaining informed consent, the endoscope was                            passed under direct vision. Throughout the                            procedure, the patient's blood pressure, pulse, and                            oxygen saturations were monitored  continuously. The                            [id number] Olympus endoscope was introduced                            through the mouth, and advanced to the second part                            of duodenum. The upper GI endoscopy was                            accomplished without difficulty. The patient                            tolerated the procedure well. Scope  In: Scope Out: Findings:      The examined esophagus was normal. No esophageal varices were seen.      The Z-line was regular and was found 37 cm from the incisors.      Localized mild inflammation characterized by erythema was found in the       gastric antrum. Biopsies were taken with a cold forceps for Helicobacter       pylori testing.      A single 3 mm angiodysplastic lesion with no bleeding was found in the       gastric body.      The examined duodenum was normal. Impression:               - Normal esophagus.                           - Z-line regular, 37 cm from the incisors.                           - Gastritis. Biopsied.                           - A single non-bleeding angiodysplastic lesion in                            the stomach.                           - Normal examined duodenum. Moderate Sedation:      See the other procedure note for documentation of moderate sedation with       intraservice time. Recommendation:           - Resume previous diet.                           - Continue present medications.                           - Await pathology results.                           - Repeat upper endoscopy in 2 years for  surveillance.                           - Return to GI office PRN.                           - Telephone GI office if patient not contacted for                            pathology results in 2 weeks. Procedure Code(s):        --- Professional ---                           [phone], Esophagogastroduodenoscopy, flexible,                            transoral; with biopsy, single or multiple Diagnosis Code(s):        --- Professional ---                           K29.70, Gastritis, unspecified, without bleeding                           K31.819, Angiodysplasia of stomach and duodenum                            without bleeding                           K74.60, Unspecified cirrhosis of liver                           R93.3,  Abnormal findings on diagnostic imaging of                            other parts of digestive tract CPT copyright 2022 American [employer] Association. All rights reserved. The codes documented in this report are preliminary and upon coder review may  be revised to meet current compliance requirements. Dr Danton Clap, DO Danton Clap DO, DO 07/09/2022 10:51:29 AM Number of Addenda: 0

## 2022-07-10 ENCOUNTER — Encounter (HOSPITAL_COMMUNITY): Payer: Self-pay | Admitting: Internal Medicine

## 2022-07-10 LAB — SURGICAL PATHOLOGY

## 2022-10-09 DIAGNOSIS — K7469 Other cirrhosis of liver: Secondary | ICD-10-CM | POA: Diagnosis not present

## 2022-10-09 DIAGNOSIS — K7581 Nonalcoholic steatohepatitis (NASH): Secondary | ICD-10-CM | POA: Diagnosis not present

## 2022-11-01 DIAGNOSIS — Z0000 Encounter for general adult medical examination without abnormal findings: Secondary | ICD-10-CM | POA: Diagnosis not present

## 2022-11-01 DIAGNOSIS — E1169 Type 2 diabetes mellitus with other specified complication: Secondary | ICD-10-CM | POA: Diagnosis not present

## 2022-11-01 DIAGNOSIS — E039 Hypothyroidism, unspecified: Secondary | ICD-10-CM | POA: Diagnosis not present

## 2022-11-01 DIAGNOSIS — K7460 Unspecified cirrhosis of liver: Secondary | ICD-10-CM | POA: Diagnosis not present

## 2022-11-01 DIAGNOSIS — E663 Overweight: Secondary | ICD-10-CM | POA: Diagnosis not present

## 2022-11-01 DIAGNOSIS — D696 Thrombocytopenia, unspecified: Secondary | ICD-10-CM | POA: Diagnosis not present

## 2022-11-01 DIAGNOSIS — E559 Vitamin D deficiency, unspecified: Secondary | ICD-10-CM | POA: Diagnosis not present

## 2022-11-01 DIAGNOSIS — C220 Liver cell carcinoma: Secondary | ICD-10-CM | POA: Diagnosis not present

## 2022-11-01 DIAGNOSIS — Z1389 Encounter for screening for other disorder: Secondary | ICD-10-CM | POA: Diagnosis not present

## 2022-11-19 ENCOUNTER — Other Ambulatory Visit: Payer: Self-pay | Admitting: Interventional Radiology

## 2022-11-19 DIAGNOSIS — C220 Liver cell carcinoma: Secondary | ICD-10-CM

## 2022-11-21 ENCOUNTER — Other Ambulatory Visit: Payer: Self-pay | Admitting: Interventional Radiology

## 2022-11-21 DIAGNOSIS — C220 Liver cell carcinoma: Secondary | ICD-10-CM

## 2022-11-22 ENCOUNTER — Ambulatory Visit: Payer: Medicare Other

## 2022-12-02 ENCOUNTER — Other Ambulatory Visit (HOSPITAL_COMMUNITY)
Admission: RE | Admit: 2022-12-02 | Discharge: 2022-12-02 | Disposition: A | Discharge status: Home / Self Care | Payer: Medicare Other | Source: Ambulatory Visit | Attending: Interventional Radiology | Admitting: Interventional Radiology

## 2022-12-02 DIAGNOSIS — C220 Liver cell carcinoma: Secondary | ICD-10-CM | POA: Diagnosis not present

## 2022-12-02 LAB — CBC
HCT: 34.8 % — ABNORMAL LOW (ref 36.0–46.0)
Hemoglobin: 11.4 g/dL — ABNORMAL LOW (ref 12.0–15.0)
MCH: 31.3 pg (ref 26.0–34.0)
MCHC: 32.8 g/dL (ref 30.0–36.0)
MCV: 95.6 fL (ref 80.0–100.0)
Platelets: 111 10*3/uL — ABNORMAL LOW (ref 150–400)
RBC: 3.64 MIL/uL — ABNORMAL LOW (ref 3.87–5.11)
RDW: 13.2 % (ref 11.5–15.5)
WBC: 2.1 10*3/uL — ABNORMAL LOW (ref 4.0–10.5)
nRBC: 0 % (ref 0.0–0.2)

## 2022-12-02 LAB — COMPREHENSIVE METABOLIC PANEL
ALT: 19 U/L (ref 0–44)
AST: 28 U/L (ref 15–41)
Albumin: 3.5 g/dL (ref 3.5–5.0)
Alkaline Phosphatase: 77 U/L (ref 38–126)
Anion gap: 6 (ref 5–15)
BUN: 20 mg/dL (ref 8–23)
CO2: 23 mmol/L (ref 22–32)
Calcium: 8.7 mg/dL — ABNORMAL LOW (ref 8.9–10.3)
Chloride: 105 mmol/L (ref 98–111)
Creatinine, Ser: 0.99 mg/dL (ref 0.44–1.00)
GFR, Estimated: 57 mL/min — ABNORMAL LOW (ref >60)
Glucose, Bld: 140 mg/dL — ABNORMAL HIGH (ref 70–99)
Potassium: 3.8 mmol/L (ref 3.5–5.1)
Sodium: 134 mmol/L — ABNORMAL LOW (ref 135–145)
Total Bilirubin: 0.8 mg/dL (ref 0.3–1.2)
Total Protein: 6.9 g/dL (ref 6.5–8.1)

## 2022-12-03 LAB — AFP TUMOR MARKER: AFP, Serum, Tumor Marker: 2.5 ng/mL (ref 0.0–8.7)

## 2022-12-04 ENCOUNTER — Ambulatory Visit (HOSPITAL_COMMUNITY)
Admission: RE | Admit: 2022-12-04 | Discharge: 2022-12-04 | Disposition: A | Discharge status: Home / Self Care | Payer: Medicare Other | Source: Ambulatory Visit | Attending: Interventional Radiology | Admitting: Interventional Radiology

## 2022-12-04 DIAGNOSIS — C220 Liver cell carcinoma: Secondary | ICD-10-CM | POA: Diagnosis not present

## 2022-12-04 DIAGNOSIS — R188 Other ascites: Secondary | ICD-10-CM | POA: Diagnosis not present

## 2022-12-04 DIAGNOSIS — K769 Liver disease, unspecified: Secondary | ICD-10-CM | POA: Diagnosis not present

## 2022-12-04 DIAGNOSIS — R161 Splenomegaly, not elsewhere classified: Secondary | ICD-10-CM | POA: Diagnosis not present

## 2022-12-04 MED ORDER — GADOBUTROL 1 MMOL/ML IV SOLN
7.0000 mL | Freq: Once | INTRAVENOUS | Status: AC | PRN
  Administered 2022-12-04: 7 mL via INTRAVENOUS

## 2022-12-06 ENCOUNTER — Ambulatory Visit
Admission: RE | Admit: 2022-12-06 | Discharge: 2022-12-06 | Disposition: A | Discharge status: Home / Self Care | Payer: Medicare Other | Source: Ambulatory Visit | Attending: Interventional Radiology | Admitting: Interventional Radiology

## 2022-12-06 DIAGNOSIS — C220 Liver cell carcinoma: Secondary | ICD-10-CM | POA: Diagnosis not present

## 2022-12-06 HISTORY — PX: IR RADIOLOGIST EVAL & MGMT: IMG5224

## 2022-12-06 NOTE — Progress Notes (Signed)
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    Referring Physician(s): Dr. Candise Che  History of Present Illness: Crystal Barnett is a 82 y.o. 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 MRI imaging was performed first in December 2016 to evaluate a region of possible concern in the left hepatic lobe. On that study, there was a small 9 mm enhancing focus in hepatic segment 7 in the posterosuperior aspect of the hepatic dome. This subsequently been followed at 6 month intervals with repeat MRI scans of the abdomen.   MRI from 04/16/18 demonstrates more definitive enlargement of the lesion now measuring up to 1.9 cm.  Additionally, there appears to be an enhancing pseudocapsule.  On the present study, the imaging characteristics are consistent with a Li-RADS 5 lesion which is diagnostic of hepatocellular carcinoma.  The very slow growth rate over the past 3 years suggests a low-grade or indolent malignancy at this time.   Due to the presence of ascites and the location of the lesion high in the right dome just under the lung, we elected to proceed with transarterial radio embolization segmentectomy (7) which was performed on 11/17/2018.   Today, we are having our 64-month post procedure follow-up consultation via telemedicine.  Crystal Barnett is doing well.  She denies fatigue, abdominal pain, nausea, vomiting or other clinical symptoms.     MRI 03/11/19: Treated segment 7 right liver mass is decreased in size and demonstrates no internal solid enhancement to suggest residual or recurrent viability. Geographic signal intensity changes at the liver dome surrounding the treated lesion, favor post treatment change. No new liver masses. Continued MRI surveillance recommended.   MRI 07/05/19: Stable appearance of treated segment 7 right liver lesion which  is decreased in size without internal enhancement. No new liver Lesions.   MRI 10/01/19: Unchanged ablation site of the liver dome, hepatic segment VII, without residual contrast enhancement. No evidence of viability or recurrence. LI-RADS 5T. No new suspicious liver lesions.   MRI  12/31/19: Similar size of ablation defect within the high right hepatic lobe, without findings of recurrent or metastatic disease.  Cirrhosis and portal venous hypertension, without new hepatocellular carcinoma.   MRI 03/20/20: At the site of the prior segment 7 hepatocellular carcinoma that was shown on 04/16/2018, there is a small nonenhancing 1.0 by 0.6 cm ablation site with no significant recurrent arterial phase enhancement to suggest active malignancy.   MRI 09/24/20: Stable small nonenhancing lesion at the hepatic dome in segment 7 consistent with treated disease. No findings suspicious for recurrent tumor and no new early arterial phase enhancing hepatic lesions to suggest hepatoma or dysplastic nodule.   MRI 04/02/21: Stable post treatment changes in the right lobe of the liver, with no new hypervascular lesion to suggest new or recurrent hepatocellular carcinoma.   MRI 11/16/21: Stable post treatment changes involving the right hepatic lobe in segment 7. No findings for dysplastic nodules or HCC.   MRI 05/30/22: Stable treated lesion in segment 7 of the liver with some surrounding fibrosis. Separate tiny lesion in segment 3 is relatively nonaggressive and stable from previous.  MRI 12/04/22: Chronic changes of cirrhosis with very mild hepatic fibrosis, similar to prior study. Treated lesion in segment 7 of the liver is unchanged. No aggressive appearing hepatic lesions are noted at this time.   I spoke with Crystal Barnett over the  telephone today.  She is doing well and has had no major events since our last clinical visit.  She is pleased to hear that her MRI results remain good.  She has no issues with recurrent  symptomatic ascites.  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, Notes that she's had this for at least 20 years   Liver cirrhosis secondary to NASH (HCC)    Osteoporosis    Portal venous hypertension (HCC)    Splenomegaly    Thrombocytopenia (HCC)    Appears chronic from at least 2014   Vitamin D deficiency     Past Surgical History:  Procedure Laterality Date   arthroscopic left knee  2005   BIOPSY  07/09/2022   Procedure: BIOPSY;  Surgeon: Lynann Bologna, DO;  Location: [facility];  Service: Gastroenterology;;   BOWEL RESECTION N/A 09/12/2021   Procedure: SMALL BOWEL RESECTION;  Surgeon: Quentin Ore, MD;  Location: [facility];  Service: General;  Laterality: N/A;   BREAST BIOPSY Left 2017   BREAST EXCISIONAL BIOPSY Right 2008   BREAST SURGERY  2008   Breast Biopsy    CHOLECYSTECTOMY  1985   COLONOSCOPY     ESOPHAGOGASTRODUODENOSCOPY (EGD) WITH PROPOFOL N/A 07/09/2022   Procedure: ESOPHAGOGASTRODUODENOSCOPY (EGD) WITH PROPOFOL;  Surgeon: Lynann Bologna, DO;  Location: [facility];  Service: Gastroenterology;  Laterality: N/A;   IR ANGIOGRAM SELECTIVE EACH ADDITIONAL VESSEL  11/03/2018   IR ANGIOGRAM SELECTIVE EACH ADDITIONAL VESSEL  11/03/2018   IR ANGIOGRAM SELECTIVE EACH ADDITIONAL VESSEL  11/03/2018   IR ANGIOGRAM SELECTIVE EACH ADDITIONAL VESSEL  11/03/2018   IR ANGIOGRAM SELECTIVE EACH ADDITIONAL VESSEL  11/20/2018   IR ANGIOGRAM SELECTIVE EACH ADDITIONAL VESSEL  11/20/2018    IR ANGIOGRAM SELECTIVE EACH ADDITIONAL VESSEL  11/20/2018   IR ANGIOGRAM VISCERAL SELECTIVE  11/03/2018   IR ANGIOGRAM VISCERAL SELECTIVE  11/03/2018   IR ANGIOGRAM VISCERAL SELECTIVE  11/03/2018   IR ANGIOGRAM VISCERAL SELECTIVE  11/20/2018   IR ANGIOGRAM VISCERAL SELECTIVE  11/20/2018   IR EMBO TUMOR ORGAN ISCHEMIA INFARCT INC GUIDE ROADMAPPING  11/20/2018   IR GENERIC HISTORICAL  04/02/2016   IR RADIOLOGIST EVAL & MGMT 04/02/2016 Malachy Moan, MD [facility] INTERV RAD   IR PARACENTESIS  02/02/2018   IR PARACENTESIS  04/20/2018   IR PARACENTESIS  12/03/2018   IR PARACENTESIS  07/15/2019   IR PARACENTESIS  02/25/2020   IR PARACENTESIS  06/22/2020   IR PARACENTESIS  08/08/2020   IR PARACENTESIS  09/18/2020   IR PARACENTESIS  12/06/2020   IR PARACENTESIS  03/23/2021   IR PARACENTESIS  09/05/2021   IR PARACENTESIS  09/10/2021   IR RADIOLOGIST EVAL & MGMT  10/29/2016   IR RADIOLOGIST EVAL & MGMT  04/29/2017   IR RADIOLOGIST EVAL & MGMT  11/04/2017   IR RADIOLOGIST EVAL & MGMT  04/16/2018   IR  RADIOLOGIST EVAL & MGMT  12/08/2018   IR RADIOLOGIST EVAL & MGMT  03/16/2019   IR RADIOLOGIST EVAL & MGMT  07/08/2019   IR RADIOLOGIST EVAL & MGMT  10/07/2019   IR RADIOLOGIST EVAL & MGMT  01/05/2020   IR RADIOLOGIST EVAL & MGMT  03/23/2020   IR RADIOLOGIST EVAL & MGMT  09/26/2020   IR RADIOLOGIST EVAL & MGMT  04/11/2021   IR RADIOLOGIST EVAL & MGMT  11/20/2021   IR US GUIDE VASC ACCESS LEFT  11/20/2018   IR US GUIDE VASC ACCESS RIGHT  11/03/2018   POLYPECTOMY     UMBILICAL HERNIA REPAIR N/A 09/12/2021   Procedure: UMBILICAL HERNIA REPAIR;  Surgeon: Quentin Ore, MD;  Location: [facility];  Service: General;  Laterality: N/A;   UPPER GASTROINTESTINAL ENDOSCOPY      Allergies: Patient has no known allergies.  Medications: Prior to Admission medications   Medication Sig Start Date End Date Taking? Authorizing Provider  alendronate (FOSAMAX) 70 MG tablet Take 70 mg by mouth every Saturday. Take with a full glass of  water on an empty stomach.    [provider]  Calcium Carbonate (CALCIUM 600 PO) Take 600 mg by mouth daily.    [provider]  cholecalciferol (VITAMIN D3) 25 MCG (1000 UNIT) tablet Take 1,000 Units by mouth in the morning and at bedtime.    [provider]  docusate sodium (COLACE) 100 MG capsule Take 1 capsule (100 mg total) by mouth 2 (two) times daily. 09/20/21   [place], Alvira Philips, DO  ferrous sulfate 325 (65 FE) MG tablet Take 1 tablet (325 mg total) by mouth daily with breakfast. 09/21/21   [place], Eric J, DO  fluticasone (FLONASE) 50 MCG/ACT nasal spray Place 1 spray into both nostrils daily. 08/21/21   [provider]  furosemide (LASIX) 40 MG tablet Take 1 tablet (40 mg total) by mouth daily. 06/12/21   Hilarie Fredrickson, MD  levothyroxine (SYNTHROID) 50 MCG tablet Take 1 tablet (50 mcg total) by mouth daily. 09/04/21   Romero Belling, MD  metFORMIN (GLUCOPHAGE) 500 MG tablet Take 500 mg by mouth every evening. At 8p    [provider]  montelukast (SINGULAIR) 10 MG tablet Take 10 mg by mouth at bedtime.  03/25/14   [provider]  Multiple Vitamin (MULTIVITAMIN) tablet Take 1 tablet by mouth at bedtime.     [provider]  pantoprazole (PROTONIX) 40 MG tablet Take 1 tablet (40 mg total) by mouth 2 (two) times daily. 06/12/21   Hilarie Fredrickson, MD  polyethylene glycol (MIRALAX / GLYCOLAX) 17 g packet Take 17 g by mouth 2 (two) times daily. Patient taking differently: Take 17 g by mouth daily as needed for moderate constipation. 09/20/21   [place], Alvira Philips, DO  senna (SENOKOT) 8.6 MG tablet Take 1 tablet by mouth 2 (two) times daily.    [provider]  spironolactone (ALDACTONE) 100 MG tablet Take 2 tablets (200 mg total) by mouth daily. 06/12/21   Hilarie Fredrickson, MD  vitamin C (ASCORBIC ACID) 500 MG tablet Take 500 mg by mouth daily.    [provider]     Family History  Problem Relation Age of Onset   Breast cancer  Mother        in 73's   Stroke Father 10   Goiter Maternal Grandmother    Colon cancer Neg Hx    Colon polyps Neg Hx    Esophageal cancer Neg Hx  Stomach cancer Neg Hx    Rectal cancer Neg Hx    Pancreatic cancer Neg Hx     Social History   Socioeconomic History   Marital status: Single    Spouse name: Not on file   Number of children: 1   Years of education: Not on file   Highest education level: Not on file  Occupational History   Occupation: retired    Comment: works in Geologist, engineering for [employer]  Tobacco Use   Smoking status: Former    Current packs/day: 0.00    Types: Cigarettes    Quit date: 05/13/1990    Years since quitting: 32.5   Smokeless tobacco: Never  Vaping Use   Vaping status: Never Used  Substance and Sexual Activity   Alcohol use: No    Alcohol/week: 0.0 standard drinks of alcohol   Drug use: No   Sexual activity: Not on file  Other Topics Concern   Not on file  Social History Narrative   Not on file   Social Determinants of Health   Financial Resource Strain: Low Risk  (11/07/2021)   Overall Financial Resource Strain (CARDIA)    Difficulty of Paying Living Expenses: Not hard at all  Food Insecurity: No Food Insecurity (11/07/2021)   Hunger Vital Sign    Worried About Running Out of Food in the Last Year: Never true    Ran Out of Food in the Last Year: Never true  Transportation Needs: No Transportation Needs (11/07/2021)   PRAPARE - Administrator, Civil Service (Medical): No    Lack of Transportation (Non-Medical): No  Physical Activity: Not on file  Stress: No Stress Concern Present (11/07/2021)   [employer] of Occupational Health - Occupational Stress Questionnaire    Feeling of Stress : Not at all  Social Connections: Not on file    ECOG Status: 0 - Asymptomatic  Review of Systems  Review of Systems: A 12 point ROS discussed and pertinent positives are indicated in the HPI above.  All other systems  are negative.  Advance Care Plan: The advanced care plan/surrogate decision maker was discussed at the time of visit and the patient did not wish to discuss or was not able to name a surrogate decision maker or provide an advance care plan.    Physical Exam No direct physical exam was performed (except for noted visual exam findings with Video Visits).    Vital Signs: There were no vitals taken for this visit.  Imaging: MR ABDOMEN WWO CONTRAST  Result Date: 12/04/2022 CLINICAL DATA:  82 year old female with history of cirrhosis with hepatocellular carcinoma status post ablation therapy. Follow-up study. EXAM: MRI ABDOMEN WITHOUT AND WITH CONTRAST TECHNIQUE: Multiplanar multisequence MR imaging of the abdomen was performed both before and after the administration of intravenous contrast. CONTRAST:  7mL GADAVIST GADOBUTROL 1 MMOL/ML IV SOLN COMPARISON:  Multiple priors, most recently MRI of the abdomen 05/30/2022. FINDINGS: Lower chest: Unremarkable. Hepatobiliary: Liver has a shrunken appearance and nodular contour, indicative of underlying cirrhosis. Lace-like pattern is mild T2 hyperintensity throughout the hepatic parenchyma and similar distribution of delayed enhancement throughout the hepatic parenchyma, suggesting a background of mild hepatic fibrosis. Focal T1 hypointense, T2 hyperintense, nonenhancing region in segment 7 of the liver corresponding to previously ablated lesion. Tiny subcentimeter low signal intensity lesion in segment 3 of the liver which appears to demonstrate some mild loss of signal intensity on in phase dual echo images, likely a small siderotic nodule. No  new suspicious appearing hepatic lesions are otherwise noted. No intra or extrahepatic biliary ductal dilatation. Status post cholecystectomy. Pancreas: No pancreatic mass. No pancreatic ductal dilatation. No well organized peripancreatic fluid collections or inflammatory changes. Spleen: Spleen appears mildly enlarged  measuring 9.8 x 6.5 x 13.4 cm (estimated splenic volume of 427 mL) . Adrenals/Urinary Tract: Bilateral kidneys and bilateral adrenal glands are normal in appearance. No hydroureteronephrosis in the visualized portions of the abdomen. Stomach/Bowel: Visualized portions are unremarkable Vascular/Lymphatic: No aneurysm identified in the visualized abdominal vasculature. Portal vein is patent measuring 13 mm in the porta hepatis. No lymphadenopathy noted in the abdomen. Other: Large volume of ascites. Ventral hernia incompletely imaged. Musculoskeletal: No aggressive appearing osseous lesions are noted in the visualized portions of the skeleton. IMPRESSION: 1. Chronic changes of cirrhosis with very mild hepatic fibrosis, similar to prior study. Treated lesion in segment 7 of the liver is unchanged. No aggressive appearing hepatic lesions are noted at this time. 2. Mild splenomegaly. 3. Large volume of ascites. 4. Ventral hernia incompletely imaged. Electronically Signed   By: Trudie Reed M.D.   On: 12/04/2022 11:06    Labs:  CBC: Recent Labs    01/09/22 1156 05/17/22 1222 12/02/22 1015  WBC 3.3* 3.2* 2.1*  HGB 12.2 12.4 11.4*  HCT 34.6* 37.4 34.8*  PLT 113* 131* 111*    COAGS: Recent Labs    05/17/22 1222  INR 1.1    BMP: Recent Labs    01/09/22 1156 05/17/22 1222 12/02/22 1015  NA 133* 136 134*  K 4.1 4.0 3.8  CL 103 101 105  CO2 26 26 23   GLUCOSE 87 91 140*  BUN 18 25* 20  CALCIUM 9.5 9.2 8.7*  CREATININE 0.76 0.89 0.99  GFRNONAA >60 >60 57*    LIVER FUNCTION TESTS: Recent Labs    01/09/22 1156 05/17/22 1222 12/02/22 1015  BILITOT 0.6 0.7 0.8  AST 25 33 28  ALT 16 23 19   ALKPHOS 93 88 77  PROT 6.8 7.5 6.9  ALBUMIN 3.9 3.9 3.5    TUMOR MARKERS: No results for input(s): "AFPTM", "CEA", "CA199", "CHROMGRNA" in the last 8760 hours.  Assessment and Plan:  82 year old female doing well status post radiation segmentectomy of hepatocellular carcinoma in segment  7. Most recent MR imaging demonstrates no evidence of recurrent or new disease.  She is 4 years disease-free.  We will continue routine surveillance.     1.)  Next follow-up MRI with gadolinium and accompanying clinic visit 6 months.    Electronically Signed: Sterling Big 12/06/2022, 11:31 AM   I spent a total of 15 Minutes in remote  clinical consultation, greater than 50% of which was counseling/coordinating care for hepatocellular cancer.    Visit type: Audio only (telephone). Audio (no video) only due to patient preference. Alternative for in-person consultation at [facility], [address] [place], [place], [place]. This visit type was conducted due to national recommendations for restrictions regarding the COVID-19 Pandemic (e.g. social distancing).  This format is felt to be most appropriate for this patient at this time.  All issues noted in this document were discussed and addressed.

## 2023-01-16 ENCOUNTER — Other Ambulatory Visit: Payer: Self-pay

## 2023-01-16 DIAGNOSIS — C220 Liver cell carcinoma: Secondary | ICD-10-CM

## 2023-01-17 ENCOUNTER — Inpatient Hospital Stay: Payer: Medicare Other | Attending: Hematology

## 2023-01-17 ENCOUNTER — Inpatient Hospital Stay (HOSPITAL_BASED_OUTPATIENT_CLINIC_OR_DEPARTMENT_OTHER): Payer: Medicare Other | Admitting: Hematology

## 2023-01-17 VITALS — BP 128/60 | HR 72 | Temp 97.5°F | Resp 18 | Wt 167.0 lb

## 2023-01-17 DIAGNOSIS — K7460 Unspecified cirrhosis of liver: Secondary | ICD-10-CM | POA: Diagnosis not present

## 2023-01-17 DIAGNOSIS — M810 Age-related osteoporosis without current pathological fracture: Secondary | ICD-10-CM | POA: Insufficient documentation

## 2023-01-17 DIAGNOSIS — E039 Hypothyroidism, unspecified: Secondary | ICD-10-CM | POA: Insufficient documentation

## 2023-01-17 DIAGNOSIS — C220 Liver cell carcinoma: Secondary | ICD-10-CM

## 2023-01-17 DIAGNOSIS — E559 Vitamin D deficiency, unspecified: Secondary | ICD-10-CM | POA: Insufficient documentation

## 2023-01-17 DIAGNOSIS — Z87891 Personal history of nicotine dependence: Secondary | ICD-10-CM | POA: Diagnosis not present

## 2023-01-17 DIAGNOSIS — Z86010 Personal history of colonic polyps: Secondary | ICD-10-CM | POA: Insufficient documentation

## 2023-01-17 DIAGNOSIS — Z79890 Hormone replacement therapy (postmenopausal): Secondary | ICD-10-CM | POA: Insufficient documentation

## 2023-01-17 DIAGNOSIS — K7581 Nonalcoholic steatohepatitis (NASH): Secondary | ICD-10-CM | POA: Insufficient documentation

## 2023-01-17 DIAGNOSIS — I10 Essential (primary) hypertension: Secondary | ICD-10-CM | POA: Diagnosis not present

## 2023-01-17 DIAGNOSIS — K7689 Other specified diseases of liver: Secondary | ICD-10-CM | POA: Diagnosis not present

## 2023-01-17 DIAGNOSIS — D696 Thrombocytopenia, unspecified: Secondary | ICD-10-CM | POA: Diagnosis not present

## 2023-01-17 DIAGNOSIS — E119 Type 2 diabetes mellitus without complications: Secondary | ICD-10-CM | POA: Diagnosis not present

## 2023-01-17 DIAGNOSIS — K766 Portal hypertension: Secondary | ICD-10-CM | POA: Insufficient documentation

## 2023-01-17 DIAGNOSIS — Z7984 Long term (current) use of oral hypoglycemic drugs: Secondary | ICD-10-CM | POA: Diagnosis not present

## 2023-01-17 DIAGNOSIS — Z79899 Other long term (current) drug therapy: Secondary | ICD-10-CM | POA: Insufficient documentation

## 2023-01-17 DIAGNOSIS — R188 Other ascites: Secondary | ICD-10-CM | POA: Diagnosis not present

## 2023-01-17 DIAGNOSIS — L439 Lichen planus, unspecified: Secondary | ICD-10-CM | POA: Insufficient documentation

## 2023-01-17 DIAGNOSIS — K5900 Constipation, unspecified: Secondary | ICD-10-CM | POA: Insufficient documentation

## 2023-01-17 DIAGNOSIS — D731 Hypersplenism: Secondary | ICD-10-CM | POA: Diagnosis not present

## 2023-01-17 LAB — CBC WITH DIFFERENTIAL (CANCER CENTER ONLY)
Abs Immature Granulocytes: 0.01 10*3/uL (ref 0.00–0.07)
Basophils Absolute: 0 10*3/uL (ref 0.0–0.1)
Basophils Relative: 1 %
Eosinophils Absolute: 0.1 10*3/uL (ref 0.0–0.5)
Eosinophils Relative: 2 %
HCT: 36.1 % (ref 36.0–46.0)
Hemoglobin: 12.4 g/dL (ref 12.0–15.0)
Immature Granulocytes: 0 %
Lymphocytes Relative: 12 %
Lymphs Abs: 0.3 10*3/uL — ABNORMAL LOW (ref 0.7–4.0)
MCH: 31.6 pg (ref 26.0–34.0)
MCHC: 34.3 g/dL (ref 30.0–36.0)
MCV: 92.1 fL (ref 80.0–100.0)
Monocytes Absolute: 0.3 10*3/uL (ref 0.1–1.0)
Monocytes Relative: 10 %
Neutro Abs: 2.1 10*3/uL (ref 1.7–7.7)
Neutrophils Relative %: 75 %
Platelet Count: 116 10*3/uL — ABNORMAL LOW (ref 150–400)
RBC: 3.92 MIL/uL (ref 3.87–5.11)
RDW: 12.8 % (ref 11.5–15.5)
WBC Count: 2.8 10*3/uL — ABNORMAL LOW (ref 4.0–10.5)
nRBC: 0 % (ref 0.0–0.2)

## 2023-01-17 LAB — CMP (CANCER CENTER ONLY)
ALT: 15 U/L (ref 0–44)
AST: 27 U/L (ref 15–41)
Albumin: 3.8 g/dL (ref 3.5–5.0)
Alkaline Phosphatase: 89 U/L (ref 38–126)
Anion gap: 4 — ABNORMAL LOW (ref 5–15)
BUN: 19 mg/dL (ref 8–23)
CO2: 28 mmol/L (ref 22–32)
Calcium: 9.4 mg/dL (ref 8.9–10.3)
Chloride: 102 mmol/L (ref 98–111)
Creatinine: 0.92 mg/dL (ref 0.44–1.00)
GFR, Estimated: >60 mL/min (ref >60)
Glucose, Bld: 102 mg/dL — ABNORMAL HIGH (ref 70–99)
Potassium: 4.4 mmol/L (ref 3.5–5.1)
Sodium: 134 mmol/L — ABNORMAL LOW (ref 135–145)
Total Bilirubin: 0.9 mg/dL (ref 0.3–1.2)
Total Protein: 7 g/dL (ref 6.5–8.1)

## 2023-01-18 LAB — AFP TUMOR MARKER: AFP, Serum, Tumor Marker: 2.5 ng/mL (ref 0.0–8.7)

## 2023-01-23 NOTE — Progress Notes (Signed)
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 Archer Asa) s/p Y90 rx with good response  Current Treatment: observation and further evaluation   INTERVAL HISTORY:   Crystal Barnett is here for her 1 year follow-up for [facility] and her thrombocytopenia. She continues to follow-up with Dr. Marina Goodell from GI and with Dr. Greggory Stallion from IR for hepatocellular carcinoma. Her last MRI was on 11/29/2020 and showed chronic changes of liver cirrhosis and no new liver lesions.  Mild splenomegaly.  Large volume ascites. She notes that surgery for her umbilical hernia took a long time to heal. No overt GI bleeding symptoms.  REVIEW OF SYSTEMS:   10 Point review of Systems was done is negative except as noted above. Does note presence of an abdominal hernia still. . 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, Notes that she's had this for at least 20 years   Liver cirrhosis secondary to NASH (HCC)    Osteoporosis    Portal venous hypertension (HCC)    Splenomegaly    Thrombocytopenia (HCC)    Appears chronic from at least 2014   Vitamin D deficiency      . Past Surgical History:  Procedure Laterality Date   arthroscopic left knee  2005   BIOPSY  07/09/2022   Procedure: BIOPSY;  Surgeon: Lynann Bologna, DO;  Location: [facility];  Service: Gastroenterology;;   BOWEL RESECTION N/A 09/12/2021   Procedure: SMALL BOWEL RESECTION;  Surgeon: Quentin Ore, MD;  Location: [facility] OR;  Service: General;  Laterality: N/A;   BREAST BIOPSY Left 2017   BREAST EXCISIONAL BIOPSY Right 2008   BREAST SURGERY  2008   Breast Biopsy    CHOLECYSTECTOMY  1985   COLONOSCOPY     ESOPHAGOGASTRODUODENOSCOPY (EGD) WITH PROPOFOL N/A 07/09/2022   Procedure: ESOPHAGOGASTRODUODENOSCOPY (EGD) WITH PROPOFOL;  Surgeon: Lynann Bologna, DO;  Location: [facility];  Service: Gastroenterology;  Laterality: N/A;   IR ANGIOGRAM SELECTIVE EACH ADDITIONAL VESSEL  11/03/2018   IR ANGIOGRAM SELECTIVE EACH ADDITIONAL VESSEL  11/03/2018   IR ANGIOGRAM SELECTIVE EACH ADDITIONAL VESSEL  11/03/2018   IR ANGIOGRAM SELECTIVE EACH ADDITIONAL VESSEL  11/03/2018   IR ANGIOGRAM SELECTIVE EACH ADDITIONAL VESSEL  11/20/2018   IR ANGIOGRAM SELECTIVE EACH ADDITIONAL VESSEL  11/20/2018   IR ANGIOGRAM SELECTIVE EACH ADDITIONAL VESSEL  11/20/2018   IR ANGIOGRAM VISCERAL SELECTIVE  11/03/2018   IR ANGIOGRAM VISCERAL SELECTIVE  11/03/2018   IR ANGIOGRAM VISCERAL SELECTIVE  11/03/2018   IR ANGIOGRAM VISCERAL SELECTIVE  11/20/2018  IR ANGIOGRAM VISCERAL SELECTIVE  11/20/2018   IR EMBO TUMOR ORGAN ISCHEMIA INFARCT INC GUIDE ROADMAPPING  11/20/2018   IR GENERIC HISTORICAL  04/02/2016   IR RADIOLOGIST EVAL & MGMT 04/02/2016 Malachy Moan, MD [facility] INTERV RAD   IR PARACENTESIS  02/02/2018   IR PARACENTESIS  04/20/2018   IR PARACENTESIS  12/03/2018   IR PARACENTESIS  07/15/2019   IR PARACENTESIS  02/25/2020   IR PARACENTESIS  06/22/2020   IR PARACENTESIS  08/08/2020   IR PARACENTESIS  09/18/2020   IR PARACENTESIS  12/06/2020   IR PARACENTESIS  03/23/2021   IR PARACENTESIS  09/05/2021   IR  PARACENTESIS  09/10/2021   IR RADIOLOGIST EVAL & MGMT  10/29/2016   IR RADIOLOGIST EVAL & MGMT  04/29/2017   IR RADIOLOGIST EVAL & MGMT  11/04/2017   IR RADIOLOGIST EVAL & MGMT  04/16/2018   IR RADIOLOGIST EVAL & MGMT  12/08/2018   IR RADIOLOGIST EVAL & MGMT  03/16/2019   IR RADIOLOGIST EVAL & MGMT  07/08/2019   IR RADIOLOGIST EVAL & MGMT  10/07/2019   IR RADIOLOGIST EVAL & MGMT  01/05/2020   IR RADIOLOGIST EVAL & MGMT  03/23/2020   IR RADIOLOGIST EVAL & MGMT  09/26/2020   IR RADIOLOGIST EVAL & MGMT  04/11/2021   IR RADIOLOGIST EVAL & MGMT  11/20/2021   IR RADIOLOGIST EVAL & MGMT  12/06/2022   IR US GUIDE VASC ACCESS LEFT  11/20/2018   IR US GUIDE VASC ACCESS RIGHT  11/03/2018   POLYPECTOMY     UMBILICAL HERNIA REPAIR N/A 09/12/2021   Procedure: UMBILICAL HERNIA REPAIR;  Surgeon: Quentin Ore, MD;  Location: [facility] OR;  Service: General;  Laterality: N/A;   UPPER GASTROINTESTINAL ENDOSCOPY      Social History   Tobacco Use   Smoking status: Former    Current packs/day: 0.00    Types: Cigarettes    Quit date: 05/13/1990    Years since quitting: 32.7   Smokeless tobacco: Never  Vaping Use   Vaping status: Never Used  Substance Use Topics   Alcohol use: No    Alcohol/week: 0.0 standard drinks of alcohol   Drug use: No    ALLERGIES:  has No Known Allergies.   MEDICATIONS:  Current Outpatient Medications  Medication Sig Dispense Refill   alendronate (FOSAMAX) 70 MG tablet Take 70 mg by mouth every Saturday. Take with a full glass of water on an empty stomach.     Calcium Carbonate (CALCIUM 600 PO) Take 600 mg by mouth daily.     cholecalciferol (VITAMIN D3) 25 MCG (1000 UNIT) tablet Take 1,000 Units by mouth in the morning and at bedtime.     docusate sodium (COLACE) 100 MG capsule Take 1 capsule (100 mg total) by mouth 2 (two) times daily. 10 capsule 0   ferrous sulfate 325 (65 FE) MG tablet Take 1 tablet (325 mg total) by mouth daily with breakfast.  3   fluticasone (FLONASE) 50  MCG/ACT nasal spray Place 1 spray into both nostrils daily.     furosemide (LASIX) 40 MG tablet Take 1 tablet (40 mg total) by mouth daily. 90 tablet 3   levothyroxine (SYNTHROID) 50 MCG tablet Take 1 tablet (50 mcg total) by mouth daily. 90 tablet 1   metFORMIN (GLUCOPHAGE) 500 MG tablet Take 500 mg by mouth every evening. At 8p     montelukast (SINGULAIR) 10 MG tablet Take 10 mg by mouth at bedtime.      Multiple  Vitamin (MULTIVITAMIN) tablet Take 1 tablet by mouth at bedtime.      pantoprazole (PROTONIX) 40 MG tablet Take 1 tablet (40 mg total) by mouth 2 (two) times daily. 180 tablet 3   polyethylene glycol (MIRALAX / GLYCOLAX) 17 g packet Take 17 g by mouth 2 (two) times daily. (Patient taking differently: Take 17 g by mouth daily as needed for moderate constipation.) 14 each 0   senna (SENOKOT) 8.6 MG tablet Take 1 tablet by mouth 2 (two) times daily.     spironolactone (ALDACTONE) 100 MG tablet Take 2 tablets (200 mg total) by mouth daily. 180 tablet 3   vitamin C (ASCORBIC ACID) 500 MG tablet Take 500 mg by mouth daily.     No current facility-administered medications for this visit.    PHYSICAL EXAMINATION: ECOG PERFORMANCE STATUS: 1 - Symptomatic but completely ambulatory  Vitals:   01/17/23 1225  BP: 128/60  Pulse: 72  Resp: 18  Temp: (!) 97.5 F (36.4 C)  SpO2: 100%    Filed Weights   01/17/23 1225  Weight: 167 lb (75.8 kg)    .Body mass index is 29.58 kg/m.  NAD GENERAL:alert, in no acute distress and comfortable OROPHARYNX: MMM, no exudates, no oropharyngeal erythema or ulceration NECK: supple, no JVD LYMPH:  no palpable lymphadenopathy in the cervical, axillary or inguinal regions LUNGS: clear to auscultation b/l with normal respiratory effort HEART: regular rate & rhythm ABDOMEN:  normoactive bowel sounds , non tender, somewhat distended .  No palpable hepatomegaly, just palpable splenomegaly. Extremity: 1+ bilateral lower pedal edema PSYCH: alert &  oriented x 3 with fluent speech NEURO: no focal motor/sensory deficits  LABORATORY DATA:  I have reviewed the data as listed  .    Latest Ref Rng & Units 01/17/2023   11:57 AM 12/02/2022   10:15 AM 05/17/2022   12:22 PM  CBC  WBC 4.0 - 10.5 K/uL 2.8  2.1  3.2   Hemoglobin 12.0 - 15.0 g/dL 41.3  24.4  01.0   Hematocrit 36.0 - 46.0 % 36.1  34.8  37.4   Platelets 150 - 400 K/uL 116  111  131        Latest Ref Rng & Units 01/17/2023   11:57 AM 12/02/2022   10:15 AM 05/17/2022   12:22 PM  CMP  Glucose 70 - 99 mg/dL 272  536  91   BUN 8 - 23 mg/dL 19  20  25    Creatinine 0.44 - 1.00 mg/dL 6.44  0.34  7.42   Sodium 135 - 145 mmol/L 134  134  136   Potassium 3.5 - 5.1 mmol/L 4.4  3.8  4.0   Chloride 98 - 111 mmol/L 102  105  101   CO2 22 - 32 mmol/L 28  23  26    Calcium 8.9 - 10.3 mg/dL 9.4  8.7  9.2   Total Protein 6.5 - 8.1 g/dL 7.0  6.9  7.5   Total Bilirubin 0.3 - 1.2 mg/dL 0.9  0.8  0.7   Alkaline Phos 38 - 126 U/L 89  77  88   AST 15 - 41 U/L 27  28  33   ALT 0 - 44 U/L 15  19  23     AFP tumor marker 3.7  RADIOLOGY  MRI Abdomen W WO Contrast 10/28/17  IMPRESSION: 1.2 cm hypervascular mass in the dome of the right hepatic lobe shows no significant change, however Imaging findings are diagnostic of hepatocellular carcinoma in the  setting of cirrhosis. No evidence of abdominal metastatic disease. Hepatic cirrhosis, and mild splenomegaly consistent with portal venous hypertension. Moderate ascites, increased since prior study.   MRI abd w and wo contrast 04/23/17 stable appearance of segment 7 liver lesion. Highly suspicious for hepatocellular carcinoma. Cirrhosis, mild hepatic steatosis and splenomegaly.   MRI abd w and wo contrast 10/22/2016 CLINICAL DATA:  Followup of liver lesions. Nonalcoholic steatohepatitis induced cirrhosis. Thrombocytopenia. EXAM: MRI ABDOMEN WITHOUT AND WITH CONTRAST TECHNIQUE: Multiplanar multisequence MR imaging of the abdomen was  performed both before and after the administration of intravenous contrast. CONTRAST:  10mL EOVIST GADOXETATE DISODIUM 0.25 MOL/L IV SOLN COMPARISON:  MRI of 02/27/2016.  Clinic note of 04/02/2016. FINDINGS: Motion degradation involves the arterial phase post-contrast images primarily. Lower chest: Normal heart size without pericardial or pleural effusion. Hepatobiliary: Moderate to marked cirrhosis. Segment 7 liver lesion has undergone mild interval enlargement. For example, on T2 weighted imaging, measures 12 x 10 mm (image 13/series 7) versus 10 x 9 mm on the prior. On post-contrast image 27/ series 502, measures 17 x 13 mm today versus 14 x 10 mm on the prior (when remeasured). Demonstrates arterial and portal venous phase hyper enhancement with relative isointensity on more delayed post-contrast imaging. Left hepatic lobe tiny lesion is likely a cyst and is similar. Mild hepatic steatosis. Cholecystectomy, without biliary ductal dilatation. Pancreas:  Normal, without mass or ductal dilatation. Spleen:  Mild splenomegaly, 14.3 cm craniocaudal. Adrenals/Urinary Tract: Normal adrenal glands. Normal kidneys, without hydronephrosis. Stomach/Bowel: Normal stomach and abdominal bowel loops. Vascular/Lymphatic: Patent hepatic and portal veins. No specific evidence of portal venous hypertension. Circumaortic left renal vein. No retroperitoneal or retrocrural adenopathy. Other:  Small volume perihepatic ascites is unchanged Musculoskeletal: No acute osseous abnormality. IMPRESSION: 1. Mild interval enlargement of a segment 7 liver lesion, highly suspicious for hepatocellular carcinoma. 2. Cirrhosis, mild hepatic steatosis, and mild splenomegaly. Electronically Signed   By: Jeronimo Greaves M.D.   On: 10/22/2016 10:22     ASSESSMENT & PLAN:   82 y.o. female with  #1 Chronic thrombocytopenia. Platelet counts stable at 107k Likely related to cirrhosis related to NASH with mild  splenomegaly.  B12, TSH, LDH within normal limits. MRI abd shows mild splenomegaly at 14.5 cm  #2 liver cirrhosis likely related to NASH hepatitis profile neg, ferritin unrevealing. Recent EGD showed no evidence of varices. Subtle changes of portal hypertensive gastropathy.  #3 Lesion in the RIGHT hepatic lobe has imaging characteristics highly suspicious for hepatic cellular carcinoma including arterial enhancement and a new peripheral rim.   04/23/17 MRI results do not indicate a significant change in the liver nodule   -Rpt MRI from 10/28/17 shows overall stable liver lesion at 1.2cm, mild splenomegaly, moderate ascites, liver cirrhosis.   04/16/18 MRI Abdomen which revealed There has been mild increase in size of hypervascular liver lesion within posterior dome. Enhancement characteristics are compatible with LR-5 (definitely HCC). No new lesions identified. 2. Morphologic features of liver compatible with cirrhosis with stigmata of portal venous hypertension.  03/11/2019:MRI abd w and w/o contrast  IMPRESSION: 1. Treated segment 7 right liver mass is decreased in size and demonstrates no internal solid enhancement to suggest residual or recurrent viability. Geographic signal intensity changes at the liver dome surrounding the treated lesion, favor post treatment change. No new liver masses. Continued MRI surveillance recommended. 2. Cirrhosis. Small to moderate volume ascites. Mild splenomegaly. Small gastroesophageal and paraumbilical varices.   PLAN: -Discussed lab results today which shows CBC with stable  chronic leukopenia and thrombocytopenia which is nonprogressive. No lab or clinical or radiographic findings suggestive of HCC recurrence/progression at this time. -Patient does have liver cirrhosis with splenomegaly as a likely cause of her cytopenias will need to continue following with GI to actively address cares for her chronic liver disease and management of her ascites  related to liver cirrhosis. -Recommend pt f/u with Dr. Archer Asa and Dr. Marina Goodell as scheduled Diuretics per GI -No other hematologic interventions recommended at this time.   FOLLOW UP: RTC with Dr Candise Che with labs in 12 months  The total time spent in the appointment was 20 minutes*.  All of the patient's questions were answered with apparent satisfaction. The patient knows to call the clinic with any problems, questions or concerns.   Wyvonnia Lora MD MS AAHIVMS [facility] Hematology/Oncology Physician [facility]  .*Total Encounter Time as defined by the Centers for Medicare and Medicaid Services includes, in addition to the face-to-face time of a patient visit (documented in the note above) non-face-to-face time: obtaining and reviewing outside history, ordering and reviewing medications, tests or procedures, care coordination (communications with other health care professionals or caregivers) and documentation in the medical record.

## 2023-05-02 DIAGNOSIS — I872 Venous insufficiency (chronic) (peripheral): Secondary | ICD-10-CM | POA: Diagnosis not present

## 2023-05-02 DIAGNOSIS — Z23 Encounter for immunization: Secondary | ICD-10-CM | POA: Diagnosis not present

## 2023-05-02 DIAGNOSIS — Z6829 Body mass index (BMI) 29.0-29.9, adult: Secondary | ICD-10-CM | POA: Diagnosis not present

## 2023-05-02 DIAGNOSIS — E119 Type 2 diabetes mellitus without complications: Secondary | ICD-10-CM | POA: Diagnosis not present

## 2023-05-02 DIAGNOSIS — E039 Hypothyroidism, unspecified: Secondary | ICD-10-CM | POA: Diagnosis not present

## 2023-05-02 DIAGNOSIS — I10 Essential (primary) hypertension: Secondary | ICD-10-CM | POA: Diagnosis not present

## 2023-05-02 DIAGNOSIS — K7460 Unspecified cirrhosis of liver: Secondary | ICD-10-CM | POA: Diagnosis not present

## 2023-05-02 DIAGNOSIS — K1379 Other lesions of oral mucosa: Secondary | ICD-10-CM | POA: Diagnosis not present

## 2023-05-14 DIAGNOSIS — R059 Cough, unspecified: Secondary | ICD-10-CM | POA: Diagnosis not present

## 2023-05-14 DIAGNOSIS — R062 Wheezing: Secondary | ICD-10-CM | POA: Diagnosis not present

## 2023-05-14 DIAGNOSIS — R051 Acute cough: Secondary | ICD-10-CM | POA: Diagnosis not present

## 2023-05-23 ENCOUNTER — Other Ambulatory Visit: Payer: Self-pay | Admitting: *Deleted

## 2023-05-23 ENCOUNTER — Other Ambulatory Visit: Payer: Self-pay | Admitting: Interventional Radiology

## 2023-05-23 DIAGNOSIS — C220 Liver cell carcinoma: Secondary | ICD-10-CM

## 2023-06-04 ENCOUNTER — Ambulatory Visit (HOSPITAL_COMMUNITY): Payer: Medicare Other

## 2023-06-04 ENCOUNTER — Other Ambulatory Visit (HOSPITAL_COMMUNITY)
Admission: RE | Admit: 2023-06-04 | Discharge: 2023-06-04 | Disposition: A | Discharge status: Home / Self Care | Payer: Medicare Other | Source: Ambulatory Visit | Attending: Interventional Radiology | Admitting: Interventional Radiology

## 2023-06-04 DIAGNOSIS — C220 Liver cell carcinoma: Secondary | ICD-10-CM | POA: Insufficient documentation

## 2023-06-04 LAB — COMPREHENSIVE METABOLIC PANEL
ALT: 21 U/L (ref 0–44)
AST: 30 U/L (ref 15–41)
Albumin: 3.6 g/dL (ref 3.5–5.0)
Alkaline Phosphatase: 90 U/L (ref 38–126)
Anion gap: 7 (ref 5–15)
BUN: 20 mg/dL (ref 8–23)
CO2: 24 mmol/L (ref 22–32)
Calcium: 9.1 mg/dL (ref 8.9–10.3)
Chloride: 102 mmol/L (ref 98–111)
Creatinine, Ser: 0.84 mg/dL (ref 0.44–1.00)
GFR, Estimated: >60 mL/min (ref >60)
Glucose, Bld: 120 mg/dL — ABNORMAL HIGH (ref 70–99)
Potassium: 4.2 mmol/L (ref 3.5–5.1)
Sodium: 133 mmol/L — ABNORMAL LOW (ref 135–145)
Total Bilirubin: 1 mg/dL (ref 0.0–1.2)
Total Protein: 7.3 g/dL (ref 6.5–8.1)

## 2023-06-04 LAB — CBC
HCT: 37.5 % (ref 36.0–46.0)
Hemoglobin: 12.5 g/dL (ref 12.0–15.0)
MCH: 31 pg (ref 26.0–34.0)
MCHC: 33.3 g/dL (ref 30.0–36.0)
MCV: 93.1 fL (ref 80.0–100.0)
Platelets: 123 10*3/uL — ABNORMAL LOW (ref 150–400)
RBC: 4.03 MIL/uL (ref 3.87–5.11)
RDW: 12.9 % (ref 11.5–15.5)
WBC: 2.6 10*3/uL — ABNORMAL LOW (ref 4.0–10.5)
nRBC: 0 % (ref 0.0–0.2)

## 2023-06-04 LAB — PROTIME-INR
INR: 1.1 (ref 0.8–1.2)
Prothrombin Time: 14.3 s (ref 11.4–15.2)

## 2023-06-05 LAB — AFP TUMOR MARKER: AFP, Serum, Tumor Marker: 2.4 ng/mL (ref 0.0–8.7)

## 2023-06-06 ENCOUNTER — Other Ambulatory Visit (HOSPITAL_COMMUNITY): Payer: Self-pay | Admitting: Internal Medicine

## 2023-06-06 DIAGNOSIS — R188 Other ascites: Secondary | ICD-10-CM

## 2023-06-06 DIAGNOSIS — K7469 Other cirrhosis of liver: Secondary | ICD-10-CM

## 2023-06-06 DIAGNOSIS — K7581 Nonalcoholic steatohepatitis (NASH): Secondary | ICD-10-CM | POA: Diagnosis not present

## 2023-06-06 DIAGNOSIS — C220 Liver cell carcinoma: Secondary | ICD-10-CM | POA: Diagnosis not present

## 2023-06-06 DIAGNOSIS — K5909 Other constipation: Secondary | ICD-10-CM | POA: Diagnosis not present

## 2023-06-06 DIAGNOSIS — K429 Umbilical hernia without obstruction or gangrene: Secondary | ICD-10-CM | POA: Diagnosis not present

## 2023-06-10 ENCOUNTER — Ambulatory Visit (HOSPITAL_COMMUNITY)
Admission: RE | Admit: 2023-06-10 | Discharge: 2023-06-10 | Disposition: A | Discharge status: Home / Self Care | Payer: Medicare Other | Source: Ambulatory Visit | Attending: Interventional Radiology | Admitting: Interventional Radiology

## 2023-06-10 DIAGNOSIS — C220 Liver cell carcinoma: Secondary | ICD-10-CM | POA: Diagnosis not present

## 2023-06-10 DIAGNOSIS — R188 Other ascites: Secondary | ICD-10-CM | POA: Diagnosis not present

## 2023-06-10 DIAGNOSIS — K7460 Unspecified cirrhosis of liver: Secondary | ICD-10-CM | POA: Diagnosis not present

## 2023-06-10 MED ORDER — IOHEXOL 300 MG/ML  SOLN
100.0000 mL | Freq: Once | INTRAMUSCULAR | Status: AC | PRN
  Administered 2023-06-10: 100 mL via INTRAVENOUS

## 2023-06-12 ENCOUNTER — Ambulatory Visit
Admission: RE | Admit: 2023-06-12 | Discharge: 2023-06-12 | Disposition: A | Discharge status: Home / Self Care | Payer: Medicare Other | Source: Ambulatory Visit | Attending: Interventional Radiology | Admitting: Interventional Radiology

## 2023-06-12 DIAGNOSIS — C220 Liver cell carcinoma: Secondary | ICD-10-CM | POA: Diagnosis not present

## 2023-06-12 HISTORY — PX: IR RADIOLOGIST EVAL & MGMT: IMG5224

## 2023-06-12 NOTE — Progress Notes (Signed)
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    Referring Physician(s): Kelda Azad K (via Dr. Candise Che)  History of Present Illness: Crystal Barnett is a 83 y.o. 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 MRI imaging was performed first in December 2016 to evaluate a region of possible concern in the left hepatic lobe. On that study, there was a small 9 mm enhancing focus in hepatic segment 7 in the posterosuperior aspect of the hepatic dome. This subsequently been followed at 6 month intervals with repeat MRI scans of the abdomen.   MRI from 04/16/18 demonstrates more definitive enlargement of the lesion now measuring up to 1.9 cm.  Additionally, there appears to be an enhancing pseudocapsule.  On the present study, the imaging characteristics are consistent with a Li-RADS 5 lesion which is diagnostic of hepatocellular carcinoma.  The very slow growth rate over the past 3 years suggests a low-grade or indolent malignancy at this time.   Due to the presence of ascites and the location of the lesion high in the right dome just under the lung, we elected to proceed with transarterial radio embolization segmentectomy (7) which was performed on 11/17/2018.   Today, we are having our 59-month post procedure follow-up consultation via telemedicine.  Crystal Barnett is doing well.  She denies fatigue, abdominal pain, nausea, vomiting or other clinical symptoms.     MRI 03/11/19: Treated segment 7 right liver mass is decreased in size and demonstrates no internal solid enhancement to suggest residual or recurrent viability. Geographic signal intensity changes at the liver dome surrounding the treated lesion, favor post treatment change. No new liver masses. Continued MRI surveillance recommended.   MRI 07/05/19: Stable appearance of treated segment 7  right liver lesion which is decreased in size without internal enhancement. No new liver Lesions.   MRI 10/01/19: Unchanged ablation site of the liver dome, hepatic segment VII, without residual contrast enhancement. No evidence of viability or recurrence. LI-RADS 5T. No new suspicious liver lesions.   MRI  12/31/19: Similar size of ablation defect within the high right hepatic lobe, without findings of recurrent or metastatic disease.  Cirrhosis and portal venous hypertension, without new hepatocellular carcinoma.   MRI 03/20/20: At the site of the prior segment 7 hepatocellular carcinoma that was shown on 04/16/2018, there is a small nonenhancing 1.0 by 0.6 cm ablation site with no significant recurrent arterial phase enhancement to suggest active malignancy.   MRI 09/24/20: Stable small nonenhancing lesion at the hepatic dome in segment 7 consistent with treated disease. No findings suspicious for recurrent tumor and no new early arterial phase enhancing hepatic lesions to suggest hepatoma or dysplastic nodule.   MRI 04/02/21: Stable post treatment changes in the right lobe of the liver, with no new hypervascular lesion to suggest new or recurrent hepatocellular carcinoma.   MRI 11/16/21: Stable post treatment changes involving the right hepatic lobe in segment 7. No findings for dysplastic nodules or HCC.   MRI 05/30/22: Stable treated lesion in segment 7 of the liver with some surrounding fibrosis. Separate tiny lesion in segment 3 is relatively nonaggressive and stable from previous.   MRI 12/04/22: Chronic changes of cirrhosis with very mild hepatic fibrosis, similar to prior study. Treated lesion in segment 7 of the liver is unchanged. No aggressive appearing hepatic lesions are noted at this time.  CT 06/10/23 - Still  awaiting official read - No evidence of recurrent or new disease on my review   I spoke with Crystal Barnett over the telephone today.  Unfortunately, she has a recurrent hernia  arising from her lower anterior abdominal wall.  Additionally, her ascites has reaccumulated and is becoming symptomatic.  She has an appointment to undergo paracentesis on Monday.  Otherwise, she is doing well.  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, Notes that she's had this for at least 20 years   Liver cirrhosis secondary to NASH (HCC)    Osteoporosis    Portal venous hypertension (HCC)    Splenomegaly    Thrombocytopenia (HCC)    Appears chronic from at least 2014   Vitamin D deficiency     Past Surgical History:  Procedure Laterality Date   arthroscopic left knee  2005   BIOPSY  07/09/2022   Procedure: BIOPSY;  Surgeon: Lynann Bologna, DO;  Location: [facility];  Service: Gastroenterology;;   BOWEL RESECTION N/A 09/12/2021   Procedure: SMALL BOWEL RESECTION;  Surgeon: Quentin Ore, MD;  Location: [facility];  Service: General;  Laterality: N/A;   BREAST BIOPSY Left 2017   BREAST EXCISIONAL BIOPSY Right 2008   BREAST SURGERY  2008   Breast Biopsy    CHOLECYSTECTOMY  1985   COLONOSCOPY     ESOPHAGOGASTRODUODENOSCOPY (EGD) WITH PROPOFOL N/A 07/09/2022   Procedure: ESOPHAGOGASTRODUODENOSCOPY (EGD) WITH PROPOFOL;  Surgeon: Lynann Bologna, DO;  Location: [facility];  Service: Gastroenterology;  Laterality: N/A;   IR ANGIOGRAM SELECTIVE EACH ADDITIONAL VESSEL  11/03/2018   IR ANGIOGRAM SELECTIVE EACH ADDITIONAL VESSEL  11/03/2018   IR ANGIOGRAM SELECTIVE EACH ADDITIONAL VESSEL   11/03/2018   IR ANGIOGRAM SELECTIVE EACH ADDITIONAL VESSEL  11/03/2018   IR ANGIOGRAM SELECTIVE EACH ADDITIONAL VESSEL  11/20/2018   IR ANGIOGRAM SELECTIVE EACH ADDITIONAL VESSEL  11/20/2018   IR ANGIOGRAM SELECTIVE EACH ADDITIONAL VESSEL  11/20/2018   IR ANGIOGRAM VISCERAL SELECTIVE  11/03/2018   IR ANGIOGRAM VISCERAL SELECTIVE  11/03/2018   IR ANGIOGRAM VISCERAL SELECTIVE  11/03/2018   IR ANGIOGRAM VISCERAL SELECTIVE  11/20/2018   IR ANGIOGRAM VISCERAL SELECTIVE  11/20/2018   IR EMBO TUMOR ORGAN ISCHEMIA INFARCT INC GUIDE ROADMAPPING  11/20/2018   IR GENERIC HISTORICAL  04/02/2016   IR RADIOLOGIST EVAL & MGMT 04/02/2016 Malachy Moan, MD [facility]   IR PARACENTESIS  02/02/2018   IR PARACENTESIS  04/20/2018   IR PARACENTESIS  12/03/2018   IR PARACENTESIS  07/15/2019   IR PARACENTESIS  02/25/2020   IR PARACENTESIS  06/22/2020   IR PARACENTESIS  08/08/2020   IR PARACENTESIS  09/18/2020   IR PARACENTESIS  12/06/2020   IR PARACENTESIS  03/23/2021   IR PARACENTESIS  09/05/2021   IR PARACENTESIS  09/10/2021   IR RADIOLOGIST EVAL & MGMT  10/29/2016   IR RADIOLOGIST  EVAL & MGMT  04/29/2017   IR RADIOLOGIST EVAL & MGMT  11/04/2017   IR RADIOLOGIST EVAL & MGMT  04/16/2018   IR RADIOLOGIST EVAL & MGMT  12/08/2018   IR RADIOLOGIST EVAL & MGMT  03/16/2019   IR RADIOLOGIST EVAL & MGMT  07/08/2019   IR RADIOLOGIST EVAL & MGMT  10/07/2019   IR RADIOLOGIST EVAL & MGMT  01/05/2020   IR RADIOLOGIST EVAL & MGMT  03/23/2020   IR RADIOLOGIST EVAL & MGMT  09/26/2020   IR RADIOLOGIST EVAL & MGMT  04/11/2021   IR RADIOLOGIST EVAL & MGMT  11/20/2021   IR RADIOLOGIST EVAL & MGMT  12/06/2022   IR RADIOLOGIST EVAL & MGMT  06/12/2023   IR US GUIDE VASC ACCESS LEFT  11/20/2018   IR US GUIDE VASC ACCESS RIGHT  11/03/2018   POLYPECTOMY     UMBILICAL HERNIA REPAIR N/A 09/12/2021   Procedure: UMBILICAL HERNIA REPAIR;  Surgeon: Quentin Ore, MD;  Location: [facility];  Service: General;  Laterality: N/A;   UPPER GASTROINTESTINAL  ENDOSCOPY      Allergies: Patient has no known allergies.  Medications: Prior to Admission medications   Medication Sig Start Date End Date Taking? Authorizing Provider  alendronate (FOSAMAX) 70 MG tablet Take 70 mg by mouth every Saturday. Take with a full glass of water on an empty stomach.    [provider]  Calcium Carbonate (CALCIUM 600 PO) Take 600 mg by mouth daily.    [provider]  cholecalciferol (VITAMIN D3) 25 MCG (1000 UNIT) tablet Take 1,000 Units by mouth in the morning and at bedtime.    [provider]  docusate sodium (COLACE) 100 MG capsule Take 1 capsule (100 mg total) by mouth 2 (two) times daily. 09/20/21   [place], Alvira Philips, DO  ferrous sulfate 325 (65 FE) MG tablet Take 1 tablet (325 mg total) by mouth daily with breakfast. 09/21/21   [place], Eric J, DO  fluticasone (FLONASE) 50 MCG/ACT nasal spray Place 1 spray into both nostrils daily. 08/21/21   [provider]  furosemide (LASIX) 40 MG tablet Take 1 tablet (40 mg total) by mouth daily. 06/12/21   Hilarie Fredrickson, MD  levothyroxine (SYNTHROID) 50 MCG tablet Take 1 tablet (50 mcg total) by mouth daily. 09/04/21   Romero Belling, MD  metFORMIN (GLUCOPHAGE) 500 MG tablet Take 500 mg by mouth every evening. At 8p    [provider]  montelukast (SINGULAIR) 10 MG tablet Take 10 mg by mouth at bedtime.  03/25/14   [provider]  Multiple Vitamin (MULTIVITAMIN) tablet Take 1 tablet by mouth at bedtime.     [provider]  pantoprazole (PROTONIX) 40 MG tablet Take 1 tablet (40 mg total) by mouth 2 (two) times daily. 06/12/21   Hilarie Fredrickson, MD  polyethylene glycol (MIRALAX / GLYCOLAX) 17 g packet Take 17 g by mouth 2 (two) times daily. Patient taking differently: Take 17 g by mouth daily as needed for moderate constipation. 09/20/21   [place], Alvira Philips, DO  senna (SENOKOT) 8.6 MG tablet Take 1 tablet by mouth 2 (two) times daily.    [provider]   spironolactone (ALDACTONE) 100 MG tablet Take 2 tablets (200 mg total) by mouth daily. 06/12/21   Hilarie Fredrickson, MD  vitamin C (ASCORBIC ACID) 500 MG tablet Take 500 mg by mouth daily.    [provider]     Family History  Problem Relation Age of Onset  Breast cancer Mother        in 26's   Stroke Father 36   Goiter Maternal Grandmother    Colon cancer Neg Hx    Colon polyps Neg Hx    Esophageal cancer Neg Hx    Stomach cancer Neg Hx    Rectal cancer Neg Hx    Pancreatic cancer Neg Hx     Social History   Socioeconomic History   Marital status: Single    Spouse name: Not on file   Number of children: 1   Years of education: Not on file   Highest education level: Not on file  Occupational History   Occupation: retired    Comment: works in Geologist, engineering for [employer]  Tobacco Use   Smoking status: Former    Current packs/day: 0.00    Types: Cigarettes    Quit date: 05/13/1990    Years since quitting: 33.1   Smokeless tobacco: Never  Vaping Use   Vaping status: Never Used  Substance and Sexual Activity   Alcohol use: No    Alcohol/week: 0.0 standard drinks of alcohol   Drug use: No   Sexual activity: Not on file  Other Topics Concern   Not on file  Social History Narrative   Not on file   Social Drivers of Health   Financial Resource Strain: Low Risk  (11/07/2021)   Overall Financial Resource Strain (CARDIA)    Difficulty of Paying Living Expenses: Not hard at all  Food Insecurity: No Food Insecurity (11/07/2021)   Hunger Vital Sign    Worried About Running Out of Food in the Last Year: Never true    Ran Out of Food in the Last Year: Never true  Transportation Needs: No Transportation Needs (11/07/2021)   PRAPARE - Administrator, Civil Service (Medical): No    Lack of Transportation (Non-Medical): No  Physical Activity: Not on file  Stress: No Stress Concern Present (11/07/2021)   [employer] of Occupational Health -  Occupational Stress Questionnaire    Feeling of Stress : Not at all  Social Connections: Not on file    ECOG Status: 0 - Asymptomatic  Review of Systems  Review of Systems: A 12 point ROS discussed and pertinent positives are indicated in the HPI above.  All other systems are negative.  Physical Exam No direct physical exam was performed (except for noted visual exam findings with Video Visits).    Vital Signs: There were no vitals taken for this visit.  Imaging: IR Radiologist Eval & Mgmt Result Date: 06/12/2023 EXAM: ESTABLISHED PATIENT OFFICE VISIT CHIEF COMPLAINT: SEE NOTE IN EPIC HISTORY OF PRESENT ILLNESS: SEE NOTE IN EPIC REVIEW OF SYSTEMS: SEE NOTE IN EPIC PHYSICAL EXAMINATION: SEE NOTE IN EPIC ASSESSMENT AND PLAN: SEE NOTE IN EPIC Electronically Signed   By: Malachy Moan M.D.   On: 06/12/2023 10:48    Labs:  CBC: Recent Labs    12/02/22 1015 01/17/23 1157 06/04/23 1500  WBC 2.1* 2.8* 2.6*  HGB 11.4* 12.4 12.5  HCT 34.8* 36.1 37.5  PLT 111* 116* 123*    COAGS: Recent Labs    06/04/23 1500  INR 1.1    BMP: Recent Labs    12/02/22 1015 01/17/23 1157 06/04/23 1500  NA 134* 134* 133*  K 3.8 4.4 4.2  CL 105 102 102  CO2 23 28 24   GLUCOSE 140* 102* 120*  BUN 20 19 20   CALCIUM 8.7* 9.4 9.1  CREATININE 0.99  0.92 0.84  GFRNONAA 57* >60 >60    LIVER FUNCTION TESTS: Recent Labs    12/02/22 1015 01/17/23 1157 06/04/23 1500  BILITOT 0.8 0.9 1.0  AST 28 27 30   ALT 19 15 21   ALKPHOS 77 89 90  PROT 6.9 7.0 7.3  ALBUMIN 3.5 3.8 3.6    TUMOR MARKERS: No results for input(s): "AFPTM", "CEA", "CA199", "CHROMGRNA" in the last 8760 hours.  Assessment and Plan:  83 year old female doing well status post radiation segmentectomy of hepatocellular carcinoma in segment 7. Most recent imaging demonstrates no evidence of recurrent or new disease.  She is 4.5 years disease-free.  We will continue routine surveillance.     1.)  Next follow-up CT  abd-pelvis with contrast and accompanying clinic visit 6 months.     Electronically Signed: Sterling Big 06/12/2023, 12:35 PM   I spent a total of  15 Minutes in remote  clinical consultation, greater than 50% of which was counseling/coordinating care for hepatocellular cancer.    Visit type: Audio only (telephone). Audio (no video) only due to patient preference.. Alternative for in-person consultation at [facility], [address] [place], [place], [place]. This visit type was conducted due to national recommendations for restrictions regarding the COVID-19 Pandemic (e.g. social distancing).  This format is felt to be most appropriate for this patient at this time.  All issues noted in this document were discussed and addressed.

## 2023-06-16 ENCOUNTER — Ambulatory Visit (HOSPITAL_COMMUNITY)
Admission: RE | Admit: 2023-06-16 | Discharge: 2023-06-16 | Disposition: A | Discharge status: Home / Self Care | Payer: Medicare Other | Source: Ambulatory Visit | Attending: Internal Medicine | Admitting: Internal Medicine

## 2023-06-16 DIAGNOSIS — K7469 Other cirrhosis of liver: Secondary | ICD-10-CM | POA: Insufficient documentation

## 2023-06-16 DIAGNOSIS — R188 Other ascites: Secondary | ICD-10-CM | POA: Insufficient documentation

## 2023-06-16 HISTORY — PX: IR PARACENTESIS: IMG2679

## 2023-06-16 MED ORDER — LIDOCAINE HCL 1 % IJ SOLN
10.0000 mL | Freq: Once | INTRAMUSCULAR | Status: AC
  Administered 2023-06-16: 10 mL via INTRADERMAL

## 2023-06-16 MED ORDER — LIDOCAINE HCL 1 % IJ SOLN
INTRAMUSCULAR | Status: AC
  Filled 2023-06-16: qty 20

## 2023-06-16 NOTE — Procedures (Addendum)
PROCEDURE SUMMARY:  Successful US guided paracentesis from right lateral abdomen.  Yielded 6.4 liters of yellow fluid.  No immediate complications.  Pt tolerated well.   Specimen was not sent for labs.  EBL < 5mL  Hoyt Koch PA-C 06/16/2023 2:42 PM

## 2023-09-02 ENCOUNTER — Other Ambulatory Visit (HOSPITAL_COMMUNITY): Payer: Self-pay | Admitting: Internal Medicine

## 2023-09-02 DIAGNOSIS — R188 Other ascites: Secondary | ICD-10-CM

## 2023-09-02 DIAGNOSIS — K7030 Alcoholic cirrhosis of liver without ascites: Secondary | ICD-10-CM

## 2023-09-05 ENCOUNTER — Ambulatory Visit (HOSPITAL_COMMUNITY)
Admission: RE | Admit: 2023-09-05 | Discharge: 2023-09-05 | Disposition: A | Discharge status: Home / Self Care | Source: Ambulatory Visit | Attending: Internal Medicine | Admitting: Internal Medicine

## 2023-09-05 DIAGNOSIS — C220 Liver cell carcinoma: Secondary | ICD-10-CM | POA: Diagnosis not present

## 2023-09-05 DIAGNOSIS — K7030 Alcoholic cirrhosis of liver without ascites: Secondary | ICD-10-CM | POA: Diagnosis not present

## 2023-09-05 DIAGNOSIS — R188 Other ascites: Secondary | ICD-10-CM | POA: Diagnosis not present

## 2023-09-05 HISTORY — PX: IR PARACENTESIS: IMG2679

## 2023-09-05 MED ORDER — LIDOCAINE HCL 1 % IJ SOLN
20.0000 mL | Freq: Once | INTRAMUSCULAR | Status: AC
  Administered 2023-09-05: 20 mL

## 2023-09-05 MED ORDER — LIDOCAINE HCL 1 % IJ SOLN
INTRAMUSCULAR | Status: AC
  Filled 2023-09-05: qty 20

## 2023-09-05 NOTE — Procedures (Signed)
 PROCEDURE SUMMARY:  Successful image-guided paracentesis from the left abdomen.  Yielded 5.5 liters of clear, yellow peritoneal fluid.  No immediate complications.  EBL: zero Patient tolerated well.    Please see imaging section of Epic for full dictation.  Gordy Lauber Jaspreet Hollings PA-C 09/05/2023 10:07 AM

## 2023-11-12 DIAGNOSIS — H6121 Impacted cerumen, right ear: Secondary | ICD-10-CM | POA: Diagnosis not present

## 2023-11-12 DIAGNOSIS — I10 Essential (primary) hypertension: Secondary | ICD-10-CM | POA: Diagnosis not present

## 2023-11-12 DIAGNOSIS — E119 Type 2 diabetes mellitus without complications: Secondary | ICD-10-CM | POA: Diagnosis not present

## 2023-11-12 DIAGNOSIS — K7460 Unspecified cirrhosis of liver: Secondary | ICD-10-CM | POA: Diagnosis not present

## 2023-11-12 DIAGNOSIS — E559 Vitamin D deficiency, unspecified: Secondary | ICD-10-CM | POA: Diagnosis not present

## 2023-11-12 DIAGNOSIS — M810 Age-related osteoporosis without current pathological fracture: Secondary | ICD-10-CM | POA: Diagnosis not present

## 2023-11-12 DIAGNOSIS — E039 Hypothyroidism, unspecified: Secondary | ICD-10-CM | POA: Diagnosis not present

## 2023-11-12 DIAGNOSIS — Z0000 Encounter for general adult medical examination without abnormal findings: Secondary | ICD-10-CM | POA: Diagnosis not present

## 2023-11-20 ENCOUNTER — Other Ambulatory Visit: Payer: Self-pay | Admitting: Interventional Radiology

## 2023-11-20 DIAGNOSIS — C220 Liver cell carcinoma: Secondary | ICD-10-CM

## 2023-12-01 ENCOUNTER — Ambulatory Visit (HOSPITAL_COMMUNITY)
Admission: RE | Admit: 2023-12-01 | Discharge: 2023-12-01 | Disposition: A | Discharge status: Home / Self Care | Source: Ambulatory Visit | Attending: Interventional Radiology | Admitting: Interventional Radiology

## 2023-12-01 DIAGNOSIS — K439 Ventral hernia without obstruction or gangrene: Secondary | ICD-10-CM | POA: Diagnosis not present

## 2023-12-01 DIAGNOSIS — K7460 Unspecified cirrhosis of liver: Secondary | ICD-10-CM | POA: Diagnosis not present

## 2023-12-01 DIAGNOSIS — C220 Liver cell carcinoma: Secondary | ICD-10-CM | POA: Insufficient documentation

## 2023-12-01 DIAGNOSIS — R161 Splenomegaly, not elsewhere classified: Secondary | ICD-10-CM | POA: Diagnosis not present

## 2023-12-01 MED ORDER — IOHEXOL 300 MG/ML  SOLN
100.0000 mL | Freq: Once | INTRAMUSCULAR | Status: AC | PRN
  Administered 2023-12-01: 100 mL via INTRAVENOUS

## 2023-12-03 DIAGNOSIS — K7581 Nonalcoholic steatohepatitis (NASH): Secondary | ICD-10-CM | POA: Diagnosis not present

## 2023-12-03 DIAGNOSIS — C220 Liver cell carcinoma: Secondary | ICD-10-CM | POA: Diagnosis not present

## 2023-12-03 DIAGNOSIS — K7460 Unspecified cirrhosis of liver: Secondary | ICD-10-CM | POA: Diagnosis not present

## 2023-12-03 DIAGNOSIS — D696 Thrombocytopenia, unspecified: Secondary | ICD-10-CM | POA: Diagnosis not present

## 2023-12-09 ENCOUNTER — Ambulatory Visit
Admission: RE | Admit: 2023-12-09 | Discharge: 2023-12-09 | Disposition: A | Discharge status: Home / Self Care | Source: Ambulatory Visit | Attending: Interventional Radiology | Admitting: Interventional Radiology

## 2023-12-09 DIAGNOSIS — C220 Liver cell carcinoma: Secondary | ICD-10-CM

## 2023-12-09 HISTORY — PX: IR RADIOLOGIST EVAL & MGMT: IMG5224

## 2023-12-09 NOTE — Consult Note (Signed)
 This encounter was conducted via the [employer] providing interactive audio and visual communication.  The patient provided verbal consent to conduct a virtual appointment.  The patient was located at their primary residence during this encounter.  Chief Complaint: NASH Cirrhosis, HCC with recurrent ascites s/p Y90 radioembolziation performed on   Referring Physician(s): Beata Beason K  History of Present Illness: Crystal Barnett is a 83 y.o. 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 MRI imaging was performed first in December 2016 to evaluate a region of possible concern in the left hepatic lobe. On that study, there was a small 9 mm enhancing focus in hepatic segment 7 in the posterosuperior aspect of the hepatic dome. This subsequently been followed at 6 month intervals with repeat MRI scans of the abdomen.   MRI from 04/16/18 demonstrates more definitive enlargement of the lesion now measuring up to 1.9 cm.  Additionally, there appears to be an enhancing pseudocapsule.  On the present study, the imaging characteristics are consistent with a Li-RADS 5 lesion which is diagnostic of hepatocellular carcinoma.  The very slow growth rate over the past 3 years suggests a low-grade or indolent malignancy at this time.   Due to the presence of ascites and the location of the lesion high in the right dome just under the lung, we elected to proceed with transarterial radio embolization segmentectomy (7) which was performed on 11/17/2018.   Today, we are having our 59-month post procedure follow-up consultation via telemedicine.  Crystal Barnett is doing well.  She denies fatigue, abdominal pain, nausea, vomiting or other clinical symptoms.     MRI 03/11/19: Treated segment 7 right liver mass is decreased in size and demonstrates no internal solid enhancement to suggest residual or recurrent viability. Geographic signal intensity changes at the  liver dome surrounding the treated lesion, favor post treatment change. No new liver masses. Continued MRI surveillance recommended.   MRI 07/05/19: Stable appearance of treated segment 7 right liver lesion which is decreased in size without internal enhancement. No new liver Lesions.   MRI 10/01/19: Unchanged ablation site of the liver dome, hepatic segment VII, without residual contrast enhancement. No evidence of viability or recurrence. LI-RADS 5T. No new suspicious liver lesions.   MRI  12/31/19: Similar size of ablation defect within the high right hepatic lobe, without findings of recurrent or metastatic disease.  Cirrhosis and portal venous hypertension, without new hepatocellular carcinoma.   MRI 03/20/20: At the site of the prior segment 7 hepatocellular carcinoma that was shown on 04/16/2018, there is a small nonenhancing 1.0 by 0.6 cm ablation site with no significant recurrent arterial phase enhancement to suggest active malignancy.   MRI 09/24/20: Stable small nonenhancing lesion at the hepatic dome in segment 7 consistent with treated disease. No findings suspicious for recurrent tumor and no new early arterial phase enhancing hepatic lesions to suggest hepatoma or dysplastic nodule.   MRI 04/02/21: Stable post treatment changes in the right lobe of the liver, with no new hypervascular lesion to suggest new or recurrent hepatocellular carcinoma.   MRI 11/16/21: Stable post treatment changes involving the right hepatic lobe in segment 7. No findings for dysplastic nodules or HCC.   MRI 05/30/22: Stable treated lesion in segment 7 of the liver with some surrounding fibrosis. Separate tiny lesion in segment 3 is relatively nonaggressive and stable from previous.   MRI 12/04/22: Chronic changes of cirrhosis with very mild hepatic fibrosis, similar to prior study.  Treated lesion in segment 7 of the liver is unchanged. No aggressive appearing hepatic lesions are noted at this time.   CT  06/10/23 1. Cirrhosis. Stable post treatment changes in the segment 7 right liver dome. No evidence of local tumor recurrence. No new liver masses. 2. No evidence of metastatic disease in the abdomen or pelvis.  CT 12/01/23 Known treated HCC in the posterior right hepatic lobe is less conspicuous on the current study. No findings suspicious for viable tumor/recurrence.   I spoke with Crystal Barnett over the telephone today.  She is doing fairly well overall.  Last paracentesis was in April.  Recurrent ventral hernia remains a problem.    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, Notes that she's had this for at least 20 years   Liver cirrhosis secondary to NASH (HCC)    Osteoporosis    Portal venous hypertension (HCC)    Splenomegaly    Thrombocytopenia (HCC)    Appears chronic from at least 2014   Vitamin D deficiency     Past Surgical History:  Procedure Laterality Date   arthroscopic left knee  2005   BIOPSY  07/09/2022   Procedure: BIOPSY;  Surgeon: Kriss Estefana DEL, DO;  Location: [facility];  Service: Gastroenterology;;   BOWEL RESECTION N/A 09/12/2021   Procedure: SMALL BOWEL RESECTION;  Surgeon: Lyndel Deward PARAS, MD;  Location: [facility];  Service: General;  Laterality: N/A;   BREAST BIOPSY Left 2017   BREAST EXCISIONAL BIOPSY Right 2008   BREAST SURGERY  2008   Breast Biopsy    CHOLECYSTECTOMY  1985   COLONOSCOPY     ESOPHAGOGASTRODUODENOSCOPY (EGD) WITH PROPOFOL  N/A 07/09/2022    Procedure: ESOPHAGOGASTRODUODENOSCOPY (EGD) WITH PROPOFOL ;  Surgeon: Kriss Estefana DEL, DO;  Location: [facility];  Service: Gastroenterology;  Laterality: N/A;   IR ANGIOGRAM SELECTIVE EACH ADDITIONAL VESSEL  11/03/2018   IR ANGIOGRAM SELECTIVE EACH ADDITIONAL VESSEL  11/03/2018   IR ANGIOGRAM SELECTIVE EACH ADDITIONAL VESSEL  11/03/2018   IR ANGIOGRAM SELECTIVE EACH ADDITIONAL VESSEL  11/03/2018   IR ANGIOGRAM SELECTIVE EACH ADDITIONAL VESSEL  11/20/2018   IR ANGIOGRAM SELECTIVE EACH ADDITIONAL VESSEL  11/20/2018   IR ANGIOGRAM SELECTIVE EACH ADDITIONAL VESSEL  11/20/2018   IR ANGIOGRAM VISCERAL SELECTIVE  11/03/2018   IR ANGIOGRAM VISCERAL SELECTIVE  11/03/2018   IR ANGIOGRAM VISCERAL SELECTIVE  11/03/2018   IR ANGIOGRAM VISCERAL SELECTIVE  11/20/2018   IR ANGIOGRAM VISCERAL SELECTIVE  11/20/2018   IR EMBO TUMOR ORGAN ISCHEMIA INFARCT INC GUIDE ROADMAPPING  11/20/2018   IR GENERIC HISTORICAL  04/02/2016   IR RADIOLOGIST EVAL & MGMT 04/02/2016 Wilkie Lent, MD [facility]   IR PARACENTESIS  02/02/2018   IR PARACENTESIS  04/20/2018   IR PARACENTESIS  12/03/2018   IR PARACENTESIS  07/15/2019   IR PARACENTESIS  02/25/2020   IR PARACENTESIS  06/22/2020   IR PARACENTESIS  08/08/2020   IR PARACENTESIS  09/18/2020   IR PARACENTESIS  12/06/2020   IR PARACENTESIS  03/23/2021   IR PARACENTESIS  09/05/2021   IR PARACENTESIS  09/10/2021   IR PARACENTESIS  06/16/2023   IR PARACENTESIS  09/05/2023   IR RADIOLOGIST EVAL & MGMT  10/29/2016   IR RADIOLOGIST EVAL & MGMT  04/29/2017   IR RADIOLOGIST EVAL & MGMT  11/04/2017   IR RADIOLOGIST EVAL & MGMT  04/16/2018   IR RADIOLOGIST EVAL & MGMT  12/08/2018   IR RADIOLOGIST EVAL & MGMT  03/16/2019   IR RADIOLOGIST EVAL & MGMT  07/08/2019   IR RADIOLOGIST EVAL & MGMT  10/07/2019   IR RADIOLOGIST EVAL & MGMT  01/05/2020   IR RADIOLOGIST EVAL & MGMT  03/23/2020   IR RADIOLOGIST EVAL & MGMT  09/26/2020   IR RADIOLOGIST EVAL & MGMT  04/11/2021   IR RADIOLOGIST EVAL & MGMT   11/20/2021   IR RADIOLOGIST EVAL & MGMT  12/06/2022   IR RADIOLOGIST EVAL & MGMT  06/12/2023   IR US  GUIDE VASC ACCESS LEFT  11/20/2018   IR US  GUIDE VASC ACCESS RIGHT  11/03/2018   POLYPECTOMY     UMBILICAL HERNIA REPAIR N/A 09/12/2021   Procedure: UMBILICAL HERNIA REPAIR;  Surgeon: Lyndel Deward PARAS, MD;  Location: [facility];  Service: General;  Laterality: N/A;   UPPER GASTROINTESTINAL ENDOSCOPY      Allergies: Patient has no known allergies.  Medications: Prior to Admission medications   Medication Sig Start Date End Date Taking? Authorizing Provider  alendronate (FOSAMAX) 70 MG tablet Take 70 mg by mouth every Saturday. Take with a full glass of water on an empty stomach.    [provider]  Calcium Carbonate (CALCIUM 600 PO) Take 600 mg by mouth daily.    [provider]  cholecalciferol (VITAMIN D3) 25 MCG (1000 UNIT) tablet Take 1,000 Units by mouth in the morning and at bedtime.    [provider]  docusate sodium  (COLACE) 100 MG capsule Take 1 capsule (100 mg total) by mouth 2 (two) times daily. 09/20/21   [place], Eric J, DO  ferrous sulfate  325 (65 FE) MG tablet Take 1 tablet (325 mg total) by mouth daily with breakfast. 09/21/21   [place], Camellia PARAS, DO  fluticasone (FLONASE) 50 MCG/ACT nasal spray Place 1 spray into both nostrils daily. 08/21/21   [provider]  furosemide  (LASIX ) 40 MG tablet Take 1 tablet (40 mg total) by mouth daily. 06/12/21   Abran Norleen SAILOR, MD  levothyroxine  (SYNTHROID ) 50 MCG tablet Take 1 tablet (50 mcg total) by mouth daily. 09/04/21   Kassie Mallick, MD  metFORMIN (GLUCOPHAGE) 500 MG tablet Take 500 mg by mouth every evening. At 8p    [provider]  montelukast  (SINGULAIR ) 10 MG tablet Take 10 mg by mouth at bedtime.  03/25/14   [provider]  Multiple Vitamin (MULTIVITAMIN) tablet Take 1 tablet by mouth at bedtime.     [provider]  pantoprazole  (PROTONIX ) 40 MG tablet Take 1 tablet (40 mg  total) by mouth 2 (two) times daily. 06/12/21   Abran Norleen SAILOR, MD  polyethylene glycol (MIRALAX  / GLYCOLAX ) 17 g packet Take 17 g by mouth 2 (two) times daily. Patient taking differently: Take 17 g by mouth daily as needed for moderate constipation. 09/20/21   [place], Camellia PARAS, DO  senna (SENOKOT) 8.6 MG tablet Take 1 tablet by mouth 2 (two) times  daily.    [provider]  spironolactone  (ALDACTONE ) 100 MG tablet Take 2 tablets (200 mg total) by mouth daily. 06/12/21   Abran Norleen SAILOR, MD  vitamin C (ASCORBIC ACID) 500 MG tablet Take 500 mg by mouth daily.    [provider]     Family History  Problem Relation Age of Onset   Breast cancer Mother        in 55's   Stroke Father 16   Goiter Maternal Grandmother    Colon cancer Neg Hx    Colon polyps Neg Hx    Esophageal cancer Neg Hx    Stomach cancer Neg Hx    Rectal cancer Neg Hx    Pancreatic cancer Neg Hx     Social History   Socioeconomic History   Marital status: Single    Spouse name: Not on file   Number of children: 1   Years of education: Not on file   Highest education level: Not on file  Occupational History   Occupation: retired    Comment: works in Geologist, engineering for [employer]  Tobacco Use   Smoking status: Former    Current packs/day: 0.00    Types: Cigarettes    Quit date: 05/13/1990    Years since quitting: 33.5   Smokeless tobacco: Never  Vaping Use   Vaping status: Never Used  Substance and Sexual Activity   Alcohol use: No    Alcohol/week: 0.0 standard drinks of alcohol   Drug use: No   Sexual activity: Not on file  Other Topics Concern   Not on file  Social History Narrative   Not on file   Social Drivers of Health   Financial Resource Strain: Low Risk  (11/07/2021)   Overall Financial Resource Strain (CARDIA)    Difficulty of Paying Living Expenses: Not hard at all  Food Insecurity: No Food Insecurity (11/07/2021)   Hunger Vital Sign    Worried About Running Out of  Food in the Last Year: Never true    Ran Out of Food in the Last Year: Never true  Transportation Needs: No Transportation Needs (11/07/2021)   PRAPARE - Administrator, Civil Service (Medical): No    Lack of Transportation (Non-Medical): No  Physical Activity: Not on file  Stress: No Stress Concern Present (11/07/2021)   [employer] of Occupational Health - Occupational Stress Questionnaire    Feeling of Stress : Not at all  Social Connections: Not on file    ECOG Status: 0 - Asymptomatic  Review of Systems: A 12 point ROS discussed and pertinent positives are indicated in the HPI above.  All other systems are negative.  Review of Systems  Vital Signs: There were no vitals taken for this visit.  Advance Care Plan: The advanced care plan/surrogate decision maker was discussed at the time of visit and the patient did not wish to discuss or was not able to name a surrogate decision maker or provide an advance care plan.    Imaging: CT ABDOMEN PELVIS W WO CONTRAST Result Date: 12/02/2023 EXAM: CT ABDOMEN AND PELVIS WITH AND WITHOUT CONTRAST 12/01/2023 04:14:04 PM TECHNIQUE: CT of the abdomen and pelvis was performed with and without the administration of intravenous contrast. Multiplanar reformatted images are provided for review. Automated exposure control, iterative reconstruction, and/or weight based adjustment of the mA/kV was utilized to reduce the radiation dose to as low as reasonably achievable. COMPARISON: 06/10/2023 CLINICAL HISTORY: Hepatocellular carcinoma (HCC) FINDINGS: LOWER  CHEST: No acute abnormality. LIVER: Known treated lesion in the posterior right hepatic lobe is less conspicuous on the current study (image 23). No findings suspicious for viable tumor/recurrence. Cirrhosis. GALLBLADDER AND BILE DUCTS: Status post cholecystectomy. SPLEEN: Mild splenomegaly with calcified splenic granulomata. PANCREAS: No acute abnormality. ADRENAL GLANDS: No acute  abnormality. KIDNEYS, URETERS AND BLADDER: No stones in the kidneys or ureters. No hydronephrosis. No perinephric or periureteral stranding. Urinary bladder is unremarkable. GI AND BOWEL: Stomach demonstrates no acute abnormality. There is no bowel obstruction. No bowel wall thickening. PERITONEUM AND RETROPERITONEUM: Moderate abdominopelvic ascites, grossly unchanged. No free air. VASCULATURE: Atherosclerotic calcifications of the abdominal aorta and branch vessels, although patent. LYMPH NODES: No lymphadenopathy. REPRODUCTIVE ORGANS: Uterine fibroids. BONES AND SOFT TISSUES: Moderate fluid containing midline ventral hernia (image 121), unchanged. No acute osseous abnormality. IMPRESSION: 1. Cirrhosis. 2. Known treated HCC in the posterior right hepatic lobe is less conspicuous on the current study. No findings suspicious for viable tumor/recurrence. 3. Mild splenomegaly. Moderate abdominopelvic ascites. 4. Moderate fluid containing midline ventral hernia, unchanged. Electronically signed by: Pinkie Pebbles MD 12/02/2023 12:38 AM EDT RP Workstation: [id number]    Labs:  CBC: Recent Labs    01/17/23 1157 06/04/23 1500  WBC 2.8* 2.6*  HGB 12.4 12.5  HCT 36.1 37.5  PLT 116* 123*    COAGS: Recent Labs    06/04/23 1500  INR 1.1    BMP: Recent Labs    01/17/23 1157 06/04/23 1500  NA 134* 133*  K 4.4 4.2  CL 102 102  CO2 28 24  GLUCOSE 102* 120*  BUN 19 20  CALCIUM 9.4 9.1  CREATININE 0.92 0.84  GFRNONAA >60 >60    LIVER FUNCTION TESTS: Recent Labs    01/17/23 1157 06/04/23 1500  BILITOT 0.9 1.0  AST 27 30  ALT 15 21  ALKPHOS 89 90  PROT 7.0 7.3  ALBUMIN  3.8 3.6    TUMOR MARKERS: No results for input(s): AFPTM, CEA, CA199, CHROMGRNA in the last 8760 hours.  Assessment and Plan:  83 year old female doing well status post radiation segmentectomy of hepatocellular carcinoma in segment 7. Most recent imaging demonstrates no evidence of recurrent or new  disease.  She is 5 years disease-free.  We will continue routine surveillance.     1.)  Next follow-up CT abd-pelvis with contrast and accompanying clinic visit 6 months.    Electronically Signed: Delon JAYSON Beagle 12/09/2023, 1:05 PM   I spent a total of    15 Minutes in face to face in clinical consultation, greater than 50% of which was counseling/coordinating care for Crystal Barnett s/p Y90 segment 8

## 2023-12-24 DIAGNOSIS — H04123 Dry eye syndrome of bilateral lacrimal glands: Secondary | ICD-10-CM | POA: Diagnosis not present

## 2023-12-24 DIAGNOSIS — H353132 Nonexudative age-related macular degeneration, bilateral, intermediate dry stage: Secondary | ICD-10-CM | POA: Diagnosis not present

## 2024-01-23 ENCOUNTER — Inpatient Hospital Stay: Payer: Medicare Other | Admitting: Hematology

## 2024-01-23 ENCOUNTER — Inpatient Hospital Stay: Payer: Medicare Other

## 2024-02-09 ENCOUNTER — Inpatient Hospital Stay

## 2024-02-09 ENCOUNTER — Inpatient Hospital Stay: Admitting: Hematology

## 2024-02-10 ENCOUNTER — Other Ambulatory Visit: Payer: Self-pay

## 2024-02-10 DIAGNOSIS — D696 Thrombocytopenia, unspecified: Secondary | ICD-10-CM

## 2024-02-10 DIAGNOSIS — C220 Liver cell carcinoma: Secondary | ICD-10-CM

## 2024-02-11 ENCOUNTER — Inpatient Hospital Stay: Attending: Hematology

## 2024-02-11 ENCOUNTER — Inpatient Hospital Stay (HOSPITAL_BASED_OUTPATIENT_CLINIC_OR_DEPARTMENT_OTHER): Admitting: Hematology

## 2024-02-11 VITALS — BP 127/66 | HR 74 | Temp 97.5°F | Resp 18 | Wt 172.0 lb

## 2024-02-11 DIAGNOSIS — E039 Hypothyroidism, unspecified: Secondary | ICD-10-CM | POA: Insufficient documentation

## 2024-02-11 DIAGNOSIS — C220 Liver cell carcinoma: Secondary | ICD-10-CM | POA: Diagnosis not present

## 2024-02-11 DIAGNOSIS — K7581 Nonalcoholic steatohepatitis (NASH): Secondary | ICD-10-CM | POA: Diagnosis not present

## 2024-02-11 DIAGNOSIS — Z860100 Personal history of colon polyps, unspecified: Secondary | ICD-10-CM | POA: Diagnosis not present

## 2024-02-11 DIAGNOSIS — R188 Other ascites: Secondary | ICD-10-CM | POA: Insufficient documentation

## 2024-02-11 DIAGNOSIS — Z79890 Hormone replacement therapy (postmenopausal): Secondary | ICD-10-CM | POA: Diagnosis not present

## 2024-02-11 DIAGNOSIS — K766 Portal hypertension: Secondary | ICD-10-CM | POA: Insufficient documentation

## 2024-02-11 DIAGNOSIS — Z7984 Long term (current) use of oral hypoglycemic drugs: Secondary | ICD-10-CM | POA: Diagnosis not present

## 2024-02-11 DIAGNOSIS — Z7983 Long term (current) use of bisphosphonates: Secondary | ICD-10-CM | POA: Insufficient documentation

## 2024-02-11 DIAGNOSIS — Z923 Personal history of irradiation: Secondary | ICD-10-CM | POA: Insufficient documentation

## 2024-02-11 DIAGNOSIS — E559 Vitamin D deficiency, unspecified: Secondary | ICD-10-CM | POA: Insufficient documentation

## 2024-02-11 DIAGNOSIS — K7460 Unspecified cirrhosis of liver: Secondary | ICD-10-CM | POA: Diagnosis not present

## 2024-02-11 DIAGNOSIS — D696 Thrombocytopenia, unspecified: Secondary | ICD-10-CM | POA: Diagnosis not present

## 2024-02-11 DIAGNOSIS — M810 Age-related osteoporosis without current pathological fracture: Secondary | ICD-10-CM | POA: Diagnosis not present

## 2024-02-11 DIAGNOSIS — K5900 Constipation, unspecified: Secondary | ICD-10-CM | POA: Insufficient documentation

## 2024-02-11 DIAGNOSIS — Z79899 Other long term (current) drug therapy: Secondary | ICD-10-CM | POA: Insufficient documentation

## 2024-02-11 DIAGNOSIS — E119 Type 2 diabetes mellitus without complications: Secondary | ICD-10-CM | POA: Diagnosis not present

## 2024-02-11 DIAGNOSIS — Z87891 Personal history of nicotine dependence: Secondary | ICD-10-CM | POA: Diagnosis not present

## 2024-02-11 DIAGNOSIS — Z8505 Personal history of malignant neoplasm of liver: Secondary | ICD-10-CM | POA: Diagnosis not present

## 2024-02-11 DIAGNOSIS — R162 Hepatomegaly with splenomegaly, not elsewhere classified: Secondary | ICD-10-CM | POA: Diagnosis not present

## 2024-02-11 LAB — CBC WITH DIFFERENTIAL (CANCER CENTER ONLY)
Abs Immature Granulocytes: 0.01 K/uL (ref 0.00–0.07)
Basophils Absolute: 0 K/uL (ref 0.0–0.1)
Basophils Relative: 1 %
Eosinophils Absolute: 0.1 K/uL (ref 0.0–0.5)
Eosinophils Relative: 2 %
HCT: 34.6 % — ABNORMAL LOW (ref 36.0–46.0)
Hemoglobin: 11.9 g/dL — ABNORMAL LOW (ref 12.0–15.0)
Immature Granulocytes: 0 %
Lymphocytes Relative: 13 %
Lymphs Abs: 0.4 K/uL — ABNORMAL LOW (ref 0.7–4.0)
MCH: 31.8 pg (ref 26.0–34.0)
MCHC: 34.4 g/dL (ref 30.0–36.0)
MCV: 92.5 fL (ref 80.0–100.0)
Monocytes Absolute: 0.3 K/uL (ref 0.1–1.0)
Monocytes Relative: 8 %
Neutro Abs: 2.6 K/uL (ref 1.7–7.7)
Neutrophils Relative %: 76 %
Platelet Count: 112 K/uL — ABNORMAL LOW (ref 150–400)
RBC: 3.74 MIL/uL — ABNORMAL LOW (ref 3.87–5.11)
RDW: 12.6 % (ref 11.5–15.5)
WBC Count: 3.4 K/uL — ABNORMAL LOW (ref 4.0–10.5)
nRBC: 0 % (ref 0.0–0.2)

## 2024-02-11 LAB — CMP (CANCER CENTER ONLY)
ALT: 16 U/L (ref 0–44)
AST: 25 U/L (ref 15–41)
Albumin: 3.8 g/dL (ref 3.5–5.0)
Alkaline Phosphatase: 82 U/L (ref 38–126)
Anion gap: 5 (ref 5–15)
BUN: 23 mg/dL (ref 8–23)
CO2: 28 mmol/L (ref 22–32)
Calcium: 9.4 mg/dL (ref 8.9–10.3)
Chloride: 103 mmol/L (ref 98–111)
Creatinine: 1 mg/dL (ref 0.44–1.00)
GFR, Estimated: 56 mL/min — ABNORMAL LOW (ref >60)
Glucose, Bld: 68 mg/dL — ABNORMAL LOW (ref 70–99)
Potassium: 4.6 mmol/L (ref 3.5–5.1)
Sodium: 136 mmol/L (ref 135–145)
Total Bilirubin: 0.6 mg/dL (ref 0.0–1.2)
Total Protein: 6.8 g/dL (ref 6.5–8.1)

## 2024-02-11 NOTE — Progress Notes (Signed)
 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 Karalee) s/p Y90 rx with good response  Current Treatment: observation and further evaluation    INTERVAL HISTORY   Crystal Barnett is a wonderful 83 y.o. female who is here for continued evaluation and management of her hepatocellular carcinoma and thrombocytopenia.  She notes that she has been doing well and notes no issues with abnormal bleeding or bruising.  Continues to follow with Dr. Karalee for management of [facility] and has not needed any interventions over the last year.  She notes an Solicitor Dr. Kriss with [facility]  Endoscopy is planned with Dr. Kriss.  Patient reports constipation.  She notes a hernia and that she uses a hernia belt to aid comfort.   REVIEW OF SYSTEMS:   .10 Point review of Systems was done is negative except as noted above.  . 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, Notes that she's had this for at least 20 years   Liver cirrhosis secondary to NASH (HCC)    Osteoporosis    Portal venous hypertension (HCC)    Splenomegaly     Thrombocytopenia    Appears chronic from at least 2014   Vitamin D deficiency     . Past Surgical History:  Procedure Laterality Date   arthroscopic left knee  2005   BIOPSY  07/09/2022   Procedure: BIOPSY;  Surgeon: Kriss Estefana DEL, DO;  Location: [facility];  Service: Gastroenterology;;   BOWEL RESECTION N/A 09/12/2021   Procedure: SMALL BOWEL RESECTION;  Surgeon: Lyndel Deward PARAS, MD;  Location: [facility] OR;  Service: General;  Laterality: N/A;   BREAST BIOPSY Left 2017   BREAST EXCISIONAL BIOPSY Right 2008   BREAST SURGERY  2008   Breast Biopsy    CHOLECYSTECTOMY  1985   COLONOSCOPY     ESOPHAGOGASTRODUODENOSCOPY (EGD) WITH PROPOFOL  N/A 07/09/2022   Procedure: ESOPHAGOGASTRODUODENOSCOPY (EGD) WITH PROPOFOL ;  Surgeon: Kriss Estefana DEL, DO;  Location: [facility];  Service: Gastroenterology;  Laterality: N/A;   IR ANGIOGRAM SELECTIVE EACH ADDITIONAL VESSEL  11/03/2018   IR ANGIOGRAM SELECTIVE EACH ADDITIONAL VESSEL  11/03/2018   IR ANGIOGRAM SELECTIVE EACH ADDITIONAL VESSEL  11/03/2018   IR ANGIOGRAM SELECTIVE EACH ADDITIONAL VESSEL  11/03/2018   IR ANGIOGRAM SELECTIVE EACH ADDITIONAL VESSEL  11/20/2018   IR ANGIOGRAM SELECTIVE EACH ADDITIONAL VESSEL  11/20/2018   IR ANGIOGRAM SELECTIVE EACH ADDITIONAL VESSEL  11/20/2018   IR ANGIOGRAM VISCERAL SELECTIVE  11/03/2018   IR ANGIOGRAM VISCERAL SELECTIVE  11/03/2018  IR ANGIOGRAM VISCERAL SELECTIVE  11/03/2018   IR ANGIOGRAM VISCERAL SELECTIVE  11/20/2018   IR ANGIOGRAM VISCERAL SELECTIVE  11/20/2018   IR EMBO TUMOR ORGAN ISCHEMIA INFARCT INC GUIDE ROADMAPPING  11/20/2018   IR GENERIC HISTORICAL  04/02/2016   IR RADIOLOGIST EVAL & MGMT 04/02/2016 Wilkie Lent, MD [facility] INTERV RAD   IR PARACENTESIS  02/02/2018   IR PARACENTESIS  04/20/2018   IR PARACENTESIS  12/03/2018   IR PARACENTESIS  07/15/2019   IR PARACENTESIS  02/25/2020   IR PARACENTESIS  06/22/2020   IR PARACENTESIS  08/08/2020   IR PARACENTESIS  09/18/2020   IR PARACENTESIS   12/06/2020   IR PARACENTESIS  03/23/2021   IR PARACENTESIS  09/05/2021   IR PARACENTESIS  09/10/2021   IR PARACENTESIS  06/16/2023   IR PARACENTESIS  09/05/2023   IR RADIOLOGIST EVAL & MGMT  10/29/2016   IR RADIOLOGIST EVAL & MGMT  04/29/2017   IR RADIOLOGIST EVAL & MGMT  11/04/2017   IR RADIOLOGIST EVAL & MGMT  04/16/2018   IR RADIOLOGIST EVAL & MGMT  12/08/2018   IR RADIOLOGIST EVAL & MGMT  03/16/2019   IR RADIOLOGIST EVAL & MGMT  07/08/2019   IR RADIOLOGIST EVAL & MGMT  10/07/2019   IR RADIOLOGIST EVAL & MGMT  01/05/2020   IR RADIOLOGIST EVAL & MGMT  03/23/2020   IR RADIOLOGIST EVAL & MGMT  09/26/2020   IR RADIOLOGIST EVAL & MGMT  04/11/2021   IR RADIOLOGIST EVAL & MGMT  11/20/2021   IR RADIOLOGIST EVAL & MGMT  12/06/2022   IR RADIOLOGIST EVAL & MGMT  06/12/2023   IR RADIOLOGIST EVAL & MGMT  12/09/2023   IR US  GUIDE VASC ACCESS LEFT  11/20/2018   IR US  GUIDE VASC ACCESS RIGHT  11/03/2018   POLYPECTOMY     UMBILICAL HERNIA REPAIR N/A 09/12/2021   Procedure: UMBILICAL HERNIA REPAIR;  Surgeon: Lyndel Deward PARAS, MD;  Location: [facility] OR;  Service: General;  Laterality: N/A;   UPPER GASTROINTESTINAL ENDOSCOPY      Social History   Tobacco Use   Smoking status: Former    Current packs/day: 0.00    Types: Cigarettes    Quit date: 05/13/1990    Years since quitting: 33.7   Smokeless tobacco: Never  Vaping Use   Vaping status: Never Used  Substance Use Topics   Alcohol use: No    Alcohol/week: 0.0 standard drinks of alcohol   Drug use: No    ALLERGIES:  has no known allergies.   MEDICATIONS:  Current Outpatient Medications  Medication Sig Dispense Refill   alendronate (FOSAMAX) 70 MG tablet Take 70 mg by mouth every Saturday. Take with a full glass of water on an empty stomach.     Calcium Carbonate (CALCIUM 600 PO) Take 600 mg by mouth daily.     cholecalciferol (VITAMIN D3) 25 MCG (1000 UNIT) tablet Take 1,000 Units by mouth in the morning and at bedtime.     docusate sodium  (COLACE) 100 MG  capsule Take 1 capsule (100 mg total) by mouth 2 (two) times daily. 10 capsule 0   ferrous sulfate  325 (65 FE) MG tablet Take 1 tablet (325 mg total) by mouth daily with breakfast.  3   fluticasone (FLONASE) 50 MCG/ACT nasal spray Place 1 spray into both nostrils daily.     furosemide  (LASIX ) 40 MG tablet Take 1 tablet (40 mg total) by mouth daily. 90 tablet 3   levothyroxine  (SYNTHROID ) 50 MCG tablet Take 1 tablet (50  mcg total) by mouth daily. 90 tablet 1   metFORMIN (GLUCOPHAGE) 500 MG tablet Take 500 mg by mouth every evening. At 8p     montelukast  (SINGULAIR ) 10 MG tablet Take 10 mg by mouth at bedtime.      Multiple Vitamin (MULTIVITAMIN) tablet Take 1 tablet by mouth at bedtime.      pantoprazole  (PROTONIX ) 40 MG tablet Take 1 tablet (40 mg total) by mouth 2 (two) times daily. 180 tablet 3   polyethylene glycol (MIRALAX  / GLYCOLAX ) 17 g packet Take 17 g by mouth 2 (two) times daily. (Patient taking differently: Take 17 g by mouth daily as needed for moderate constipation.) 14 each 0   senna (SENOKOT) 8.6 MG tablet Take 1 tablet by mouth 2 (two) times daily.     spironolactone  (ALDACTONE ) 100 MG tablet Take 2 tablets (200 mg total) by mouth daily. 180 tablet 3   vitamin C (ASCORBIC ACID) 500 MG tablet Take 500 mg by mouth daily.     No current facility-administered medications for this visit.    PHYSICAL EXAMINATION: ECOG PERFORMANCE STATUS: 1 - Symptomatic but completely ambulatory  Vitals:   02/11/24 1429  BP: 127/66  Pulse: 74  Resp: 18  Temp: (!) 97.5 F (36.4 C)  SpO2: 99%    Filed Weights   02/11/24 1429  Weight: 172 lb (78 kg)    .Body mass index is 30.47 kg/m. SABRA GENERAL:alert, in no acute distress and comfortable SKIN: no acute rashes, no significant lesions EYES: conjunctiva are pink and non-injected, sclera anicteric OROPHARYNX: MMM, no exudates, no oropharyngeal erythema or ulceration NECK: supple, no JVD LYMPH:  no palpable lymphadenopathy in the cervical,  axillary or inguinal regions LUNGS: clear to auscultation b/l with normal respiratory effort HEART: regular rate & rhythm ABDOMEN:  normoactive bowel sounds , non tender, not distended. Extremity: 1+ bilateral pitting pedal edema PSYCH: alert & oriented x 3 with fluent speech NEURO: no focal motor/sensory deficits   LABORATORY DATA:  I have reviewed the data as listed  .    Latest Ref Rng & Units 02/11/2024    1:44 PM 06/04/2023    3:00 PM 01/17/2023   11:57 AM  CBC  WBC 4.0 - 10.5 K/uL 3.4  2.6  2.8   Hemoglobin 12.0 - 15.0 g/dL 88.0  87.4  87.5   Hematocrit 36.0 - 46.0 % 34.6  37.5  36.1   Platelets 150 - 400 K/uL 112  123  116        Latest Ref Rng & Units 02/11/2024    1:44 PM 06/04/2023    3:00 PM 01/17/2023   11:57 AM  CMP  Glucose 70 - 99 mg/dL 68  879  897   BUN 8 - 23 mg/dL 23  20  19    Creatinine 0.44 - 1.00 mg/dL 8.99  9.15  9.07   Sodium 135 - 145 mmol/L 136  133  134   Potassium 3.5 - 5.1 mmol/L 4.6  4.2  4.4   Chloride 98 - 111 mmol/L 103  102  102   CO2 22 - 32 mmol/L 28  24  28    Calcium 8.9 - 10.3 mg/dL 9.4  9.1  9.4   Total Protein 6.5 - 8.1 g/dL 6.8  7.3  7.0   Total Bilirubin 0.0 - 1.2 mg/dL 0.6  1.0  0.9   Alkaline Phos 38 - 126 U/L 82  90  89   AST 15 - 41 U/L 25  30  27   ALT 0 - 44 U/L 16  21  15     AFP tumor marker 3.7  RADIOLOGY  MRI Abdomen W WO Contrast 10/28/17  IMPRESSION: 1.2 cm hypervascular mass in the dome of the right hepatic lobe shows no significant change, however Imaging findings are diagnostic of hepatocellular carcinoma in the setting of cirrhosis. No evidence of abdominal metastatic disease. Hepatic cirrhosis, and mild splenomegaly consistent with portal venous hypertension. Moderate ascites, increased since prior study.   MRI abd w and wo contrast 04/23/17 stable appearance of segment 7 liver lesion. Highly suspicious for hepatocellular carcinoma. Cirrhosis, mild hepatic steatosis and splenomegaly.   MRI abd w and wo  contrast 10/22/2016 CLINICAL DATA:  Followup of liver lesions. Nonalcoholic steatohepatitis induced cirrhosis. Thrombocytopenia. EXAM: MRI ABDOMEN WITHOUT AND WITH CONTRAST TECHNIQUE: Multiplanar multisequence MR imaging of the abdomen was performed both before and after the administration of intravenous contrast. CONTRAST:  10mL EOVIST  GADOXETATE DISODIUM  0.25 MOL/L IV SOLN COMPARISON:  MRI of 02/27/2016.  Clinic note of 04/02/2016. FINDINGS: Motion degradation involves the arterial phase post-contrast images primarily. Lower chest: Normal heart size without pericardial or pleural effusion. Hepatobiliary: Moderate to marked cirrhosis. Segment 7 liver lesion has undergone mild interval enlargement. For example, on T2 weighted imaging, measures 12 x 10 mm (image 13/series 7) versus 10 x 9 mm on the prior. On post-contrast image 27/ series 502, measures 17 x 13 mm today versus 14 x 10 mm on the prior (when remeasured). Demonstrates arterial and portal venous phase hyper enhancement with relative isointensity on more delayed post-contrast imaging. Left hepatic lobe tiny lesion is likely a cyst and is similar. Mild hepatic steatosis. Cholecystectomy, without biliary ductal dilatation. Pancreas:  Normal, without mass or ductal dilatation. Spleen:  Mild splenomegaly, 14.3 cm craniocaudal. Adrenals/Urinary Tract: Normal adrenal glands. Normal kidneys, without hydronephrosis. Stomach/Bowel: Normal stomach and abdominal bowel loops. Vascular/Lymphatic: Patent hepatic and portal veins. No specific evidence of portal venous hypertension. Circumaortic left renal vein. No retroperitoneal or retrocrural adenopathy. Other:  Small volume perihepatic ascites is unchanged Musculoskeletal: No acute osseous abnormality. IMPRESSION: 1. Mild interval enlargement of a segment 7 liver lesion, highly suspicious for hepatocellular carcinoma. 2. Cirrhosis, mild hepatic steatosis, and mild  splenomegaly. Electronically Signed   By: Rockey Kilts M.D.   On: 10/22/2016 10:22     ASSESSMENT & PLAN:   83 y.o. female with  #1 Chronic thrombocytopenia. Platelet counts stable at 112k Likely related to cirrhosis related to NASH with mild splenomegaly.  B12, TSH, LDH within normal limits. MRI abd shows mild splenomegaly at 14.5 cm  #2 liver cirrhosis likely related to NASH hepatitis profile neg, ferritin unrevealing. Recent EGD showed no evidence of varices. Subtle changes of portal hypertensive gastropathy.  #3 Lesion in the RIGHT hepatic lobe has imaging characteristics highly suspicious for hepatic cellular carcinoma including arterial enhancement and a new peripheral rim.   04/23/17 MRI results do not indicate a significant change in the liver nodule   -Rpt MRI from 10/28/17 shows overall stable liver lesion at 1.2cm, mild splenomegaly, moderate ascites, liver cirrhosis.   04/16/18 MRI Abdomen which revealed There has been mild increase in size of hypervascular liver lesion within posterior dome. Enhancement characteristics are compatible with LR-5 (definitely HCC). No new lesions identified. 2. Morphologic features of liver compatible with cirrhosis with stigmata of portal venous hypertension.  03/11/2019:MRI abd w and w/o contrast  IMPRESSION: 1. Treated segment 7 right liver mass is decreased in size and demonstrates no internal solid  enhancement to suggest residual or recurrent viability. Geographic signal intensity changes at the liver dome surrounding the treated lesion, favor post treatment change. No new liver masses. Continued MRI surveillance recommended. 2. Cirrhosis. Small to moderate volume ascites. Mild splenomegaly. Small gastroesophageal and paraumbilical varices.   PLAN: Patient labs from today were discussed in details CBC shows normal hemoglobin of 11.9, stable platelets of 112k and a WBC count of 3.4k AFP tumor marker within normal limits at 2.2. Last  CT of the abdomen with and without contrast on 12/01/2023 with Dr. Waylon showed evidence of liver cirrhosis known treated HCC in the posterior right hepatic lobe is less conspicuous.  No findings suspicious of viable tumor/recurrence.  Mild splenomegaly.  Moderate abdominal pelvic ascites.  Patient notes that she has established care with Dr. Kriss and GI and will be getting an endoscopy for variceal screening. No abnormal bleeding or bruising. No evidence of HCC recurrence/progression at this time. Platelet count stable with no issues with bleeding.  FOLLOW UP: RTC with Dr Onesimo with labs in 12 months Follow-up with Dr. Waylon as per his recommendations and Dr. Kriss  The total time spent in the appointment was 25 minutes*.  All of the patient's questions were answered with apparent satisfaction. The patient knows to call the clinic with any problems, questions or concerns.   Emaline Onesimo MD MS AAHIVMS [facility] Hematology/Oncology Physician [facility]  .*Total Encounter Time as defined by the Centers for Medicare and Medicaid Services includes, in addition to the face-to-face time of a patient visit (documented in the note above) non-face-to-face time: obtaining and reviewing outside history, ordering and reviewing medications, tests or procedures, care coordination (communications with other health care professionals or caregivers) and documentation in the medical record.

## 2024-02-12 LAB — AFP TUMOR MARKER: AFP, Serum, Tumor Marker: 2.2 ng/mL (ref 0.0–8.7)

## 2024-05-18 ENCOUNTER — Other Ambulatory Visit: Payer: Self-pay | Admitting: Interventional Radiology

## 2024-05-18 DIAGNOSIS — C220 Liver cell carcinoma: Secondary | ICD-10-CM

## 2024-05-26 ENCOUNTER — Encounter: Payer: Self-pay | Admitting: Podiatry

## 2024-05-26 ENCOUNTER — Ambulatory Visit (INDEPENDENT_AMBULATORY_CARE_PROVIDER_SITE_OTHER): Admitting: Podiatry

## 2024-05-26 DIAGNOSIS — M79674 Pain in right toe(s): Secondary | ICD-10-CM | POA: Diagnosis not present

## 2024-05-26 DIAGNOSIS — M79671 Pain in right foot: Secondary | ICD-10-CM

## 2024-05-26 DIAGNOSIS — M79675 Pain in left toe(s): Secondary | ICD-10-CM | POA: Diagnosis not present

## 2024-05-26 DIAGNOSIS — B351 Tinea unguium: Secondary | ICD-10-CM

## 2024-05-26 DIAGNOSIS — M7752 Other enthesopathy of left foot: Secondary | ICD-10-CM | POA: Diagnosis not present

## 2024-05-26 NOTE — Progress Notes (Signed)
 Patient presents for evaluation and treatment of tenderness and some redness around nails feet.  Tenderness around toes with walking and wearing shoes.  Planes of pain at the fourth toe left at the DIPJ.  Hurts with walking and wearing shoes.  Physical exam:  General appearance: Alert, pleasant, and in no acute distress.  Vascular: Pedal pulses: DP 2/4 B/L, PT 2/4 B/L.  Moderate edema lower legs bilaterally.  Capillary refill time immediate bilaterally  Neurologic: Light touch intact bilaterally feet.  Normal Achilles tendon reflex bilaterally.  No clonus or spasticity noted.  Dermatologic:  Nails thickened, disfigured, discolored 1-5 BL with subungual debris.  Redness and hypertrophic nail folds along nail folds bilaterally but no signs of drainage or infection.  Skin thin atrophic no hair growth lower extremity bilaterally.  Areas of hyperpigmentation lower extremity bilaterally  Musculoskeletal:  Hammertoe deformities 2 through 5 bilaterally.  Tenderness at the lateral and dorsal aspect of the DIPJ of the fourth toe left.  Normal muscle strength lower extremity bilateral   Diagnosis: 1. Painful onychomycotic nails 1 through 5 bilaterally. 2. Pain toes 1 through 5 bilaterally. 3.  Capsulitis DIPJ fourth toe left  Plan: New patient office visit for evaluation and management level 3.  Modifier 25 - The fourth toe and the pain at the DIPJ.  Dispensed a silicone toe sleeve to protect the toe.  Wear shoes with good roomy toebox and socks with good padding around the toes.  -Debrided onychomycotic nails 1 through 5 bilaterally.  Sharply debrided nails with nail clipper and reduced with a power bur.  Return 3 months RFC

## 2024-05-28 ENCOUNTER — Ambulatory Visit (HOSPITAL_COMMUNITY)
Admission: RE | Admit: 2024-05-28 | Discharge: 2024-05-28 | Disposition: A | Discharge status: Home / Self Care | Source: Ambulatory Visit | Attending: Interventional Radiology | Admitting: Interventional Radiology

## 2024-05-28 DIAGNOSIS — C220 Liver cell carcinoma: Secondary | ICD-10-CM | POA: Insufficient documentation

## 2024-05-28 DIAGNOSIS — E1169 Type 2 diabetes mellitus with other specified complication: Secondary | ICD-10-CM | POA: Insufficient documentation

## 2024-05-28 LAB — POCT I-STAT CREATININE: Creatinine, Ser: 0.9 mg/dL (ref 0.44–1.00)

## 2024-05-28 MED ORDER — IOHEXOL 300 MG/ML  SOLN
100.0000 mL | Freq: Once | INTRAMUSCULAR | Status: AC | PRN
  Administered 2024-05-28: 100 mL via INTRAVENOUS

## 2024-06-09 ENCOUNTER — Inpatient Hospital Stay: Admission: RE | Admit: 2024-06-09 | Source: Ambulatory Visit

## 2024-06-14 ENCOUNTER — Inpatient Hospital Stay: Admission: RE | Admit: 2024-06-14 | Source: Ambulatory Visit

## 2024-08-25 ENCOUNTER — Ambulatory Visit: Admitting: Podiatry

## 2025-02-09 ENCOUNTER — Other Ambulatory Visit

## 2025-02-09 ENCOUNTER — Ambulatory Visit: Admitting: Hematology
